# Patient Record
Sex: Female | Born: 1950 | Race: White | Hispanic: No | Marital: Single | Smoking: Never smoker
Health system: Southern US, Community
[De-identification: ages and names within clinical notes are randomized; demographics above are authoritative.]

## PROBLEM LIST (undated history)

## (undated) DIAGNOSIS — E119 Type 2 diabetes mellitus without complications: Secondary | ICD-10-CM

## (undated) DIAGNOSIS — S5292XA Unspecified fracture of left forearm, initial encounter for closed fracture: Secondary | ICD-10-CM

## (undated) DIAGNOSIS — I10 Essential (primary) hypertension: Secondary | ICD-10-CM

## (undated) DIAGNOSIS — E785 Hyperlipidemia, unspecified: Secondary | ICD-10-CM

## (undated) HISTORY — DX: Hyperlipidemia, unspecified: E78.5

## (undated) HISTORY — PX: TONSILLECTOMY: SUR1361

## (undated) HISTORY — DX: Type 2 diabetes mellitus without complications: E11.9

## (undated) HISTORY — PX: FRACTURE SURGERY: SHX138

## (undated) HISTORY — PX: FOOT SURGERY: SHX648

## (undated) HISTORY — DX: Essential (primary) hypertension: I10

---

## 2000-06-16 ENCOUNTER — Encounter: Admission: RE | Admit: 2000-06-16 | Discharge: 2000-06-16 | Payer: Self-pay | Admitting: Occupational Medicine

## 2000-06-16 ENCOUNTER — Encounter: Payer: Self-pay | Admitting: Occupational Medicine

## 2020-12-05 DIAGNOSIS — H547 Unspecified visual loss: Secondary | ICD-10-CM | POA: Insufficient documentation

## 2020-12-05 DIAGNOSIS — H9190 Unspecified hearing loss, unspecified ear: Secondary | ICD-10-CM | POA: Insufficient documentation

## 2021-05-23 ENCOUNTER — Inpatient Hospital Stay
Admission: EM | Admit: 2021-05-23 | Discharge: 2021-05-26 | DRG: 065 | Disposition: A | Discharge status: Rehabilitation Facility | Payer: Medicare Other | Attending: Internal Medicine | Admitting: Internal Medicine

## 2021-05-23 ENCOUNTER — Other Ambulatory Visit: Payer: Self-pay

## 2021-05-23 ENCOUNTER — Other Ambulatory Visit: Payer: Self-pay | Admitting: Neurology

## 2021-05-23 ENCOUNTER — Observation Stay: Payer: Medicare Other

## 2021-05-23 ENCOUNTER — Encounter: Payer: Self-pay | Admitting: Emergency Medicine

## 2021-05-23 ENCOUNTER — Emergency Department: Payer: Medicare Other

## 2021-05-23 DIAGNOSIS — E1165 Type 2 diabetes mellitus with hyperglycemia: Secondary | ICD-10-CM | POA: Diagnosis present

## 2021-05-23 DIAGNOSIS — I10 Essential (primary) hypertension: Secondary | ICD-10-CM | POA: Diagnosis present

## 2021-05-23 DIAGNOSIS — Z28310 Unvaccinated for covid-19: Secondary | ICD-10-CM

## 2021-05-23 DIAGNOSIS — I63522 Cerebral infarction due to unspecified occlusion or stenosis of left anterior cerebral artery: Secondary | ICD-10-CM | POA: Diagnosis not present

## 2021-05-23 DIAGNOSIS — R471 Dysarthria and anarthria: Secondary | ICD-10-CM | POA: Diagnosis present

## 2021-05-23 DIAGNOSIS — Z79899 Other long term (current) drug therapy: Secondary | ICD-10-CM

## 2021-05-23 DIAGNOSIS — I639 Cerebral infarction, unspecified: Secondary | ICD-10-CM | POA: Diagnosis present

## 2021-05-23 DIAGNOSIS — G8191 Hemiplegia, unspecified affecting right dominant side: Secondary | ICD-10-CM | POA: Diagnosis present

## 2021-05-23 DIAGNOSIS — Z20822 Contact with and (suspected) exposure to covid-19: Secondary | ICD-10-CM | POA: Diagnosis present

## 2021-05-23 DIAGNOSIS — Z833 Family history of diabetes mellitus: Secondary | ICD-10-CM

## 2021-05-23 DIAGNOSIS — E538 Deficiency of other specified B group vitamins: Secondary | ICD-10-CM

## 2021-05-23 DIAGNOSIS — E785 Hyperlipidemia, unspecified: Secondary | ICD-10-CM | POA: Diagnosis present

## 2021-05-23 DIAGNOSIS — I6389 Other cerebral infarction: Principal | ICD-10-CM | POA: Diagnosis present

## 2021-05-23 DIAGNOSIS — R29810 Facial weakness: Secondary | ICD-10-CM | POA: Diagnosis present

## 2021-05-23 DIAGNOSIS — R29702 NIHSS score 2: Secondary | ICD-10-CM | POA: Diagnosis present

## 2021-05-23 DIAGNOSIS — R4701 Aphasia: Secondary | ICD-10-CM | POA: Diagnosis present

## 2021-05-23 DIAGNOSIS — Z8249 Family history of ischemic heart disease and other diseases of the circulatory system: Secondary | ICD-10-CM

## 2021-05-23 DIAGNOSIS — E118 Type 2 diabetes mellitus with unspecified complications: Secondary | ICD-10-CM

## 2021-05-23 DIAGNOSIS — Z823 Family history of stroke: Secondary | ICD-10-CM

## 2021-05-23 DIAGNOSIS — E1169 Type 2 diabetes mellitus with other specified complication: Secondary | ICD-10-CM | POA: Diagnosis present

## 2021-05-23 HISTORY — DX: Cerebral infarction, unspecified: I63.9

## 2021-05-23 HISTORY — DX: Unspecified fracture of left forearm, initial encounter for closed fracture: S52.92XA

## 2021-05-23 LAB — DIFFERENTIAL
Abs Immature Granulocytes: 0.04 10*3/uL (ref 0.00–0.07)
Basophils Absolute: 0 10*3/uL (ref 0.0–0.1)
Basophils Relative: 1 %
Eosinophils Absolute: 0.1 10*3/uL (ref 0.0–0.5)
Eosinophils Relative: 1 %
Immature Granulocytes: 1 %
Lymphocytes Relative: 25 %
Lymphs Abs: 2.1 10*3/uL (ref 0.7–4.0)
Monocytes Absolute: 0.7 10*3/uL (ref 0.1–1.0)
Monocytes Relative: 8 %
Neutro Abs: 5.5 10*3/uL (ref 1.7–7.7)
Neutrophils Relative %: 64 %

## 2021-05-23 LAB — CBC
HCT: 41.6 % (ref 36.0–46.0)
Hemoglobin: 14.8 g/dL (ref 12.0–15.0)
MCH: 29.1 pg (ref 26.0–34.0)
MCHC: 35.6 g/dL (ref 30.0–36.0)
MCV: 81.9 fL (ref 80.0–100.0)
Platelets: 337 10*3/uL (ref 150–400)
RBC: 5.08 MIL/uL (ref 3.87–5.11)
RDW: 12.4 % (ref 11.5–15.5)
WBC: 8.4 10*3/uL (ref 4.0–10.5)
nRBC: 0 % (ref 0.0–0.2)

## 2021-05-23 LAB — URINALYSIS, COMPLETE (UACMP) WITH MICROSCOPIC
Bilirubin Urine: NEGATIVE
Glucose, UA: >=500 mg/dL — AB
Hgb urine dipstick: NEGATIVE
Ketones, ur: 5 mg/dL — AB
Nitrite: NEGATIVE
Protein, ur: NEGATIVE mg/dL
Specific Gravity, Urine: 1.03 (ref 1.005–1.030)
pH: 5 (ref 5.0–8.0)

## 2021-05-23 LAB — COMPREHENSIVE METABOLIC PANEL
ALT: 24 U/L (ref 0–44)
AST: 26 U/L (ref 15–41)
Albumin: 4.1 g/dL (ref 3.5–5.0)
Alkaline Phosphatase: 63 U/L (ref 38–126)
Anion gap: 9 (ref 5–15)
BUN: 16 mg/dL (ref 8–23)
CO2: 25 mmol/L (ref 22–32)
Calcium: 9.6 mg/dL (ref 8.9–10.3)
Chloride: 102 mmol/L (ref 98–111)
Creatinine, Ser: 1.01 mg/dL — ABNORMAL HIGH (ref 0.44–1.00)
GFR, Estimated: >60 mL/min (ref >60)
Glucose, Bld: 372 mg/dL — ABNORMAL HIGH (ref 70–99)
Potassium: 4.3 mmol/L (ref 3.5–5.1)
Sodium: 136 mmol/L (ref 135–145)
Total Bilirubin: 0.6 mg/dL (ref 0.3–1.2)
Total Protein: 7.7 g/dL (ref 6.5–8.1)

## 2021-05-23 LAB — APTT: aPTT: 26 seconds (ref 24–36)

## 2021-05-23 LAB — VITAMIN B12: Vitamin B-12: 97 pg/mL — ABNORMAL LOW (ref 180–914)

## 2021-05-23 LAB — PROTIME-INR
INR: 0.9 (ref 0.8–1.2)
Prothrombin Time: 12.6 seconds (ref 11.4–15.2)

## 2021-05-23 MED ORDER — ASPIRIN EC 81 MG PO TBEC
324.0000 mg | DELAYED_RELEASE_TABLET | Freq: Once | ORAL | Status: AC
  Administered 2021-05-23: 324 mg via ORAL
  Filled 2021-05-23: qty 4

## 2021-05-23 MED ORDER — ENOXAPARIN SODIUM 40 MG/0.4ML IJ SOSY
40.0000 mg | PREFILLED_SYRINGE | INTRAMUSCULAR | Status: DC
  Administered 2021-05-24 – 2021-05-25 (×3): 40 mg via SUBCUTANEOUS
  Filled 2021-05-23 (×3): qty 0.4

## 2021-05-23 MED ORDER — AMLODIPINE BESYLATE 5 MG PO TABS
5.0000 mg | ORAL_TABLET | Freq: Every day | ORAL | Status: DC
  Administered 2021-05-24 – 2021-05-26 (×3): 5 mg via ORAL
  Filled 2021-05-23 (×3): qty 1

## 2021-05-23 MED ORDER — GADOBUTROL 1 MMOL/ML IV SOLN
5.0000 mL | Freq: Once | INTRAVENOUS | Status: AC | PRN
  Administered 2021-05-23: 7.5 mL via INTRAVENOUS

## 2021-05-23 MED ORDER — STROKE: EARLY STAGES OF RECOVERY BOOK: Freq: Once | Status: DC

## 2021-05-23 MED ORDER — ACETAMINOPHEN 160 MG/5ML PO SOLN
650.0000 mg | ORAL | Status: DC | PRN
Start: 2021-05-23 — End: 2021-05-26
  Filled 2021-05-23: qty 20.3

## 2021-05-23 MED ORDER — LABETALOL HCL 5 MG/ML IV SOLN: 5.0000 mg | INTRAVENOUS | Status: AC | PRN

## 2021-05-23 MED ORDER — ASPIRIN EC 81 MG PO TBEC
81.0000 mg | DELAYED_RELEASE_TABLET | Freq: Every day | ORAL | Status: DC
  Administered 2021-05-24 – 2021-05-26 (×3): 81 mg via ORAL
  Filled 2021-05-23 (×3): qty 1

## 2021-05-23 MED ORDER — ACETAMINOPHEN 325 MG PO TABS: 650.0000 mg | ORAL_TABLET | ORAL | Status: DC | PRN

## 2021-05-23 MED ORDER — SENNOSIDES-DOCUSATE SODIUM 8.6-50 MG PO TABS: 1.0000 | ORAL_TABLET | Freq: Every evening | ORAL | Status: DC | PRN

## 2021-05-23 MED ORDER — ACETAMINOPHEN 650 MG RE SUPP: 650.0000 mg | RECTAL | Status: DC | PRN

## 2021-05-23 NOTE — ED Provider Notes (Signed)
[facility] Emergency Department Provider Note ____________________________________________   Event Date/Time   First MD Initiated Contact with Patient 05/23/21 1329     (approximate)  I have reviewed the triage vital signs and the nursing notes.  HISTORY  Chief Complaint No chief complaint on file.   HPI Chessie Neuharth is a 70 y.o. femalewho presents to the ED for evaluation of possible stroke.   Chart review indicates no relevant history.  Patient presents to the ED, accompanied by her sister, for evaluation of 1 week of neurologic changes.  Patient is initially hesitant to share much.  His sister tells me that she received a letter yesterday, that was dated on 6/16, that was a birthday card to sister's husband.  Sister reports that the handwriting was very abnormal for the patient and this cued her to call the patient and evaluate her in person yesterday.  Patient was initially resistant to come to the ED yesterday, but eventually relented and came today.  Patient reports about 1 week of voice changes, right grip strength weakness and feeling off.  She denies any syncopal episodes, falls or injuries.  Denies recent illnesses, fevers.  She takes no medications.  Non-smoker.   History reviewed. No pertinent past medical history.  Patient Active Problem List   Diagnosis Date Noted   Stroke (HCC) 05/23/2021     Prior to Admission medications   Not on File    Allergies Patient has no known allergies.  No family history on file.  Social History Social History   Tobacco Use   Passive exposure: Never  Substance Use Topics   Alcohol use: Never   Drug use: Never    Review of Systems  Constitutional: No fever/chills Eyes: No visual changes. ENT: No sore throat. Cardiovascular: Denies chest pain. Respiratory: Denies shortness of breath. Gastrointestinal: No abdominal pain.  No nausea, no vomiting.  No diarrhea.  No  constipation. Genitourinary: Negative for dysuria. Musculoskeletal: Negative for back pain. Skin: Negative for rash. Neurological: Negative for headaches.  Positive for dysarthria and right hand grip strength weakness.  ____________________________________________   PHYSICAL EXAM:  VITAL SIGNS: Vitals:   05/23/21 1207  BP: (!) 146/85  Pulse: (!) 105  Resp: 20  Temp: 98.6 F (37 C)  SpO2: 96%     Constitutional: Alert and oriented. Well appearing and in no acute distress. Eyes: Conjunctivae are normal. PERRL. EOMI. Head: Atraumatic.  Right-sided facial droop. Nose: No congestion/rhinnorhea. Mouth/Throat: Mucous membranes are moist.  Oropharynx non-erythematous. Neck: No stridor. No cervical spine tenderness to palpation. Cardiovascular: Normal rate, regular rhythm. Grossly normal heart sounds.  Good peripheral circulation. Respiratory: Normal respiratory effort.  No retractions. Lungs CTAB. Gastrointestinal: Soft , nondistended, nontender to palpation. No CVA tenderness. Musculoskeletal: No lower extremity tenderness nor edema.  No joint effusions. No signs of acute trauma. Neurologic: Dysarthria is present.  Right-sided facial droop is present.  Subtle pronator drift on the right side extremities. Skin:  Skin is warm, dry and intact. No rash noted. Psychiatric: Mood and affect are normal. Speech and behavior are normal.  ____________________________________________   LABS (all labs ordered are listed, but only abnormal results are displayed)  Labs Reviewed  COMPREHENSIVE METABOLIC PANEL - Abnormal; Notable for the following components:      Result Value   Glucose, Bld 372 (*)    Creatinine, Ser 1.01 (*)    All other components within normal limits  SARS CORONAVIRUS 2 (TAT 6-24 HRS)  PROTIME-INR  APTT  CBC  DIFFERENTIAL  URINALYSIS, COMPLETE (UACMP) WITH MICROSCOPIC  HIV ANTIBODY (ROUTINE TESTING W REFLEX)  HEMOGLOBIN A1C  VITAMIN B12  VITAMIN B1  THYROID PANEL  WITH TSH  I-STAT CREATININE, ED  CBG MONITORING, ED   ____________________________________________  12 Lead EKG  Sinus rhythm, rate of 86 bpm.  Normal axis and intervals.  No STEMI. ____________________________________________  RADIOLOGY  ED MD interpretation: 2 view CXR reviewed by me without evidence of acute cardiopulmonary pathology. CT head reviewed by me with evidence of acute infarct/hypodensity of left internal capsule.  Official radiology report(s): DG Chest 2 View  Result Date: 05/23/2021 CLINICAL DATA:  Possible stroke. Additional provided: Right-sided weakness for 1 month. EXAM: CHEST - 2 VIEW COMPARISON:  No pertinent prior exams available for comparison. FINDINGS: Heart size within normal limits. No appreciable airspace consolidation. No evidence of pleural effusion or pneumothorax. No acute bony abnormality identified. IMPRESSION: No evidence of active cardiopulmonary disease. Electronically Signed   By: Jackey Loge DO   On: 05/23/2021 13:01   CT HEAD WO CONTRAST  Result Date: 05/23/2021 CLINICAL DATA:  Stroke suspected. EXAM: CT HEAD WITHOUT CONTRAST TECHNIQUE: Contiguous axial images were obtained from the base of the skull through the vertex without intravenous contrast. COMPARISON:  No pertinent prior exams available for comparison. FINDINGS: Brain: Cerebral volume is normal for age. 3.3 x 1.6 x 2.4 cm acute or early subacute infarct within the left corona/internal capsule and ganglia. No evidence of hemorrhagic conversion. No significant mass effect at this time. No demarcated cortical infarct. No extra-axial fluid collection. No evidence of intracranial mass. No midline shift. Vascular: No hyperdense vessel.  Atherosclerotic calcifications. Skull: Normal. Negative for fracture or focal lesion. Sinuses/Orbits: Visualized orbits show no acute finding. Foci of polypoid mucosal thickening versus mucous retention cysts within the right ethmoid air cells. Other: Left mastoid  effusion. These results were called by telephone at the time of interpretation on 05/23/2021 at 1:06 pm to provider Dr. Larinda Buttery, who verbally acknowledged these results. IMPRESSION: 3.3 x 2.4 cm acute or early subacute infarct within the left corona/internal capsule and basal ganglia. No evidence of hemorrhagic conversion. No significant mass effect at this time. Mild right ethmoid sinus disease. Left mastoid effusion. Electronically Signed   By: Jackey Loge DO   On: 05/23/2021 13:08    ____________________________________________   PROCEDURES and INTERVENTIONS  Procedure(s) performed (including Critical Care):  Procedures  Medications   stroke: mapping our early stages of recovery book (has no administration in time range)  acetaminophen (TYLENOL) tablet 650 mg (has no administration in time range)    Or  acetaminophen (TYLENOL) 160 MG/5ML solution 650 mg (has no administration in time range)    Or  acetaminophen (TYLENOL) suppository 650 mg (has no administration in time range)  senna-docusate (Senokot-S) tablet 1 tablet (has no administration in time range)  enoxaparin (LOVENOX) injection 40 mg (has no administration in time range)  aspirin EC tablet 324 mg (has no administration in time range)    ____________________________________________   MDM / ED COURSE   70 year old woman with no medical history and has never seen a physician presents to the ED with a week of neurologic changes consistent with CVA requiring medical admission.  Dysarthria and right-sided facial droop are prominent, as well as subtle right-sided weakness.  No evidence of hemorrhagic conversion on CT, and does demonstrate evidence of hypodensities consistent with acute/subacute CVA.  No bleed.  No evidence of ACS.  We will admit to hospitalist for further work-up and  management.     ____________________________________________   FINAL CLINICAL IMPRESSION(S) / ED DIAGNOSES  Final diagnoses:  Cerebrovascular  accident (CVA) due to other mechanism [facility])     ED Discharge Orders     None        Derrel Moore Katrinka Blazing   Note:  This document was prepared using Dragon voice recognition software and may include unintentional dictation errors.    Delton Prairie, MD 05/23/21 [phone]

## 2021-05-23 NOTE — ED Notes (Signed)
Patient taken to MRI

## 2021-05-23 NOTE — ED Notes (Signed)
Patient ambulated to and from room commode with a steady gait. Patient denies dizziness.

## 2021-05-23 NOTE — ED Notes (Signed)
Report given to Dee RN.

## 2021-05-23 NOTE — ED Provider Notes (Signed)
Emergency Medicine Provider Triage Evaluation Note  Laura [name] , a 70 y.o. female  was evaluated in triage.  Pt complains of possible stroke. Sister is with the patient, reports she has been living alone for the past month since the death of their mother. She had not spoken to her recently, but received a birhday card in the mail for her husband yesterday and noted poor handwriting. When she called to talk to her, she noted slurred speech, and when visiting noted right sided weakness. No specific last known well.  Review of Systems  Positive: Right sided weakness, slurred speech Negative: Fever, dizziness  Physical Exam  BP (!) 146/85 (BP Location: Left Arm)   Pulse (!) 105   Temp 98.6 F (37 C)   Resp 20   Ht 5\' 8"  (1.727 m)   Wt 59 kg   SpO2 96%   BMI 19.77 kg/m  Gen:   Awake, no distress  Resp:  Normal effort  Neuro:  + right sided facial droop, most noticeable with smiling. Managing secretions on her own. +right pronator drift. No weakness noted of bilateral lower extremities Other:    Medical Decision Making  Medically screening exam initiated at 12:53 PM.  Appropriate orders placed.  Gurnoor Sloop was informed that the remainder of the evaluation will be completed by another provider, this initial triage assessment does not replace that evaluation, and the importance of remaining in the ED until their evaluation is complete.  Stroke orders placed, but with unknown last known well, no code stroke called at this time.    [name], PA 05/23/21 1257    05/25/21, MD 05/23/21 [phone]

## 2021-05-23 NOTE — ED Triage Notes (Signed)
Pts sister, brought pt in because they got a birthday card that hands witting was not hers they called the pt and her speech was not normal. They drove to her house yesterday and found that the right side of her face was drooping. They state that pt lives alone, she refused to come in yesterday. She has right drift on her arm and speech is garbled. Last known well time is not known.

## 2021-05-23 NOTE — Consult Note (Signed)
Neurology Consultation Reason for Consult:  Requesting Physician: Amy Cox  CC:   History is obtained from: Patient, Sister Anselmo Rod at bedside, chart review  HPI: Laura [name] is a 70 y.o. female with a past medical history significant for avoidance of healthcare, based on laboratory and clinical data thus far likely undiagnosed diabetes and hypertension, not on any medications  Family went to check on her after they noticed abnormal handwriting on card she sent (shaky but address was correct) and noted her to have a right facial droop on 6/18.  She initially refused transport to the ED but agreed to come in on 6/19.  Based on primary team's evaluation of postmarked etc. card was likely mailed on 6/15.  She has some mild confusion and memory impairment as well, for example she denies any vision issues to me and the family reports that she was having difficulty seeing a phone that they had handed to her when they visited the night before.  Family notes that the patient is not afraid of doctors or medical care and in fact takes her pets regularly for all of their vaccinations and routine checkups (she has cats and a dog), but she herself is declined checkups, COVID vaccination etc. as she has always felt healthy and fine and is "stubborn"  LKW: Unclear, prior to 6/15 tPA given?: No, out of the window  Premorbid modified rankin scale:      0 - No symptoms.  ROS: All other review of systems was negative except as noted in the HPI though somewhat unreliable given patient seems to minimize symptoms.   History reviewed. No pertinent past medical history.  Past Surgical History:  Procedure Laterality Date   FOOT SURGERY     TONSILLECTOMY     Family History  Problem Relation Age of Onset   Hypertension Mother    Diabetes Mother    Heart failure Father    Stroke Father    Hypertension Sister     Social History:  reports that she has never smoked. She has never been exposed to tobacco  smoke. She has never used smokeless tobacco. She reports that she does not drink alcohol and does not use drugs.  She is retired Psychologist, clinical and worked previously with the [employer]  Exam: Current vital signs: BP (!) 155/72   Pulse 82   Temp 98.6 F (37 C)   Resp 12   Ht 5' 8"  (1.727 m)   Wt 59 kg   SpO2 98%   BMI 19.77 kg/m  Vital signs in last 24 hours: Temp:  [98.6 F (37 C)] 98.6 F (37 C) (06/19 1207) Pulse Rate:  [82-105] 82 (06/19 1500) Resp:  [12-20] 12 (06/19 1500) BP: (146-155)/(72-85) 155/72 (06/19 1500) SpO2:  [96 %-99 %] 98 % (06/19 1500) Weight:  [59 kg] 59 kg (06/19 1208)   Physical Exam  Constitutional: Appears well-developed and well-nourished.  Psych: Affect fairly flat Eyes: No scleral injection HENT: No oropharyngeal obstruction.  MSK: no joint deformities.  Cardiovascular: Normal rate and regular rhythm.  Respiratory: Effort normal, non-labored breathing GI: Soft.  No distension. There is no tenderness.  Skin: Warm dry and intact visible skin  Neuro: Mental Status: Patient is awake, alert, oriented to person, place, month, year, and situation (which she describes as being told she had a brain injury due to a clot), and notably while she self corrects to the current month she initially replied November and initially started replying 19.  to the year and initially reportedly as the 6th Patient is able to give limited history Patient particularly struggles with finger naming and graphesthesia, mild difficulties with copying complex finger positions, able to name other objects and repeat simple phrases Cranial Nerves: II: Visual Fields are full, though she occasionally miss reports the number of fingers. Pupils are equal, round, and reactive to light.   III,IV, VI: EOMI without ptosis or diploplia.  V: Facial sensation is symmetric to light touch VII: Facial movement is notable for facial droop involving the upper face partially but the  lower face more notably VIII: hearing is intact to voice X: Uvula elevates symmetrically XI: Shoulder shrug is symmetric. XII: tongue is midline without atrophy or fasciculations.  Motor: Tone is normal. Bulk is normal. 5/5 strength was present in all four extremities.  There is mild pronation of the right upper extremity without downward drift Sensory: Sensation is symmetric to light touch in the arms and legs. Deep Tendon Reflexes: 2+ and symmetric in the biceps and patellae.  Cerebellar: FNF and HKS are intact bilaterally  NIHSS total 4 Score breakdown: 2 points for facial droop and one-point for incorrect answer to age and one-point for mild aphasia (finger anomia)  I have reviewed labs in epic and the results pertinent to this consultation are: Glucose 372 Creatinine 1.01 CBC within normal limits   I have personally reviewed the images obtained: Head CT with hypodensity in the left corona radiata/head of the caudate MRI brain demonstrating the same infarct, with additional chronic microvascular changes   Impression: Localization of stroke is suspicious for recurrent artery of [place]ub[place] involvement, given size embolic is a possibility though small vessel disease could also be playing a role.  Patient likely has multiple uncontrolled risk factors in the setting of limited prior medical care.  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 - Stroke labs TSH, ESR, RPR, HgbA1c, fasting lipid panel - MRI brain, personally reviewed as above - MRA of the brain without contrast and MRA neck w/wo  - Frequent neuro checks - Echocardiogram - Prophylactic therapy-Antiplatelet med: Aspirin - dose 338m PO or 3036mPR, followed by 81 mg daily - Consider Plavix 300 mg load with 75 mg daily for 21 - 90 day course pending vessel imaging and if echocardiogram/telemetry do not reveal indication for anticoagulation - Risk factor modification - Telemetry  monitoring; 30 day event monitor on discharge if no arrythmias captured  - Blood pressure goal   - Normotension, to be achieved gradually - PT consult, OT consult, Speech consult - Neurology to follow  SrLesleigh NoeD-PhD [facility] [phone] coverage for [facility] from 8 AM to 4 AM in-house and 4 PM to 8 PM by telephone/video. 8 PM to 8 AM emergent questions or overnight urgent questions should be addressed to Teleneurology On-call or [facility]; contact information can be found on AMION

## 2021-05-23 NOTE — ED Notes (Signed)
Family remains at bedside.

## 2021-05-23 NOTE — H&P (Signed)
History and Physical   Laura [name] MOQ:[id number] DOB: [birth year] DOA: 05/23/2021  PCP: Pcp, No  Patient coming from: Home via private vehicle  I have personally briefly reviewed patient's old medical records in [facility] EMR.  Chief Concern: Right sided facial droop and right upper extremity weakness  HPI: Laura [name] is a 70 y.o. right handed female who has not been to a healthcare provider for many years, no diagnosis, therefore not taking any prescribed medications presents to the emergency department for chief concerns of right-sided facial droop.  Per her sister, [name] at bedside: [name] and brother-in-law received a birthday card in which they noticed that patient's signature and writing of address on envelope was abnormal.  Comparison to address written by patient is in media.  At bedside, patient is able to garbled her name and her age.  Her speech exhibits aphasia and dysarthria.  She can hear and understand however does have difficulty responding.  She reports that she believes that she started feeling weak approximately on Tuesday, 05/18/2021.  She reports that she picked up the birthday card for her brother-in-law on Monday, 05/17/2021 where she felt like her normal self.  Her sister received the card and went to her home on evening of 05/22/2021. [name] reports that they noticed the facial drooping at that time and her difficulty speaking.  They advised patient to come to the hospital for further evaluation however patient refused.  Patient reports that if she has to go to the hospital, she will go in the morning.  Note: The birthday card showed that the mail post marked on 05/20/21 and it is likely that patient placed the card on 05/19/2021.   At bedside, patient has right facial drooping. Right arm weakness with flexion of the right elbow.  Right shoulder flexion also exhibits weakness with opposing force.  She exhibits right eyelid weakness   Social history: lives by  herself. She denies etoh, tobacco use, recreational drug use. She is retired since 2012. 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   Vaccination: not vaccinated for covid-19   ROS: Constitutional: no weight change, no fever ENT/Mouth: no sore throat, no rhinorrhea Eyes: no eye pain, + vision changes, + double vision evening of 05/22/21 and AM of 05/23/21 and resolved now Cardiovascular: no chest pain, no dyspnea,  no edema, no palpitations Respiratory: no cough, no sputum, no wheezing Gastrointestinal: no nausea, no vomiting, no diarrhea, no constipation Genitourinary: no urinary incontinence, no dysuria, no hematuria Musculoskeletal: no arthralgias, no myalgias Skin: no skin lesions, no pruritus, Neuro: + weakness, no loss of consciousness, no syncope Psych: no anxiety, no depression, no decrease appetite Heme/Lymph: no bruising, no bleeding  ED Course: Discussed with ED provider, patient requiring hospitalization for chief concerns of acute/early subacute stroke.  Vitals in the emergency department show temperature of 98.6, respiration rate of 20, heart rate 105, blood pressure 146/86 and increased to 155/75.  SPO2 of 96 percent on room air.  CT the head without contrast revealed 3.3 x 2.4 cm acute/early subacute infarct within the left corona/internal capsule.  Assessment/Plan  Active Problems:   Stroke [facility])   Right upper extremity and right facial drooping secondary to likely ischemic stroke in the left corona/internal capsule - CT head without contrast read as: Acute/early subacute infarct within the left corona/internal capsule - Neurologist, Dr. Iver Nestle, has been consulted and we appreciate further recommendations - Complete echo ordered - MRI of the brain ordered - Fasting  lipid and A1c ordered - We will check B12, B1, thyroid panel - Patient is outside the window for permissive hypertension - Frequent neuro vascular checks - Heart healthy  dysphagia 2 diet ordered - Aspirin 324 mg loading dose ordered - PT, OT ordered - Fall precaution   Hypertension on presentation-no previous diagnosis of hypertension - Patient is outside the window for permissive hypertension - Amlodipine 5 mg ordered for 05/24/2021 at 10 AM - Labetalol 5 mg IV every 2 hours as needed for SBP greater than 170, 1 day ordered  Chart reviewed.   DVT prophylaxis: Enoxaparin 40 mg subcutaneous every 24 hours Code Status: full code  Diet: heart healthy with thick liquids Family Communication: Updated sister, [name] at bedside Disposition Plan: Pending clinical course Consults called: Neurology Admission status: MedSurg, observation, 48 hours of telemetry ordered  History reviewed. No pertinent past medical history.  Past Surgical History:  Procedure Laterality Date   FOOT SURGERY     TONSILLECTOMY     Social History:  reports that she does not have a smoking history on file. She has never been exposed to tobacco smoke. She does not have any smokeless tobacco history on file. She reports that she does not drink alcohol and does not use drugs.  No Known Allergies Family History  Problem Relation Age of Onset   Hypertension Mother    Diabetes Mother    Heart failure Father    Stroke Father    Hypertension Sister    Family history: Family history reviewed and pertinent for father who suffered a stroke at age 94.   Physical Exam: Vitals:   05/23/21 1207 05/23/21 1208 05/23/21 1400 05/23/21 1500  BP: (!) 146/85  (!) 155/77 (!) 155/72  Pulse: (!) 105  84 82  Resp: 20   12  Temp: 98.6 F (37 C)     SpO2: 96%  99% 98%  Weight:  59 kg    Height:  5\' 8"  (1.727 m)     Constitutional: appears age-appropriate, NAD, calm, comfortable Eyes: PERRL, lids and conjunctivae normal ENMT: Mucous membranes are moist. Posterior pharynx clear of any exudate or lesions. Age-appropriate dentition. Hearing appropriate.  Right sided mouth drooping. Neck: normal,  supple, no masses, no thyromegaly Respiratory: clear to auscultation bilaterally, no wheezing, no crackles. Normal respiratory effort. No accessory muscle use.  Cardiovascular: Regular rate and rhythm, no murmurs / rubs / gallops. No extremity edema. 2+ pedal pulses. No carotid bruits.  Abdomen: no tenderness, no masses palpated, no hepatosplenomegaly. Bowel sounds positive.  Musculoskeletal: no clubbing / cyanosis. No joint deformity upper and lower extremities. Good ROM, no contractures, no atrophy. Normal muscle tone.  Skin: no rashes, lesions, ulcers. No induration Neurologic: Sensation intact. Strength 5/5 in all left upper extremity and bilateral lower extremity.  Strength is 4 out of 5 in the right upper extremity. Psychiatric: Normal judgment and insight. Alert and oriented x 3. Normal mood.   EKG: independently reviewed, showing sinus rhythm with rate of 86, QTc 491  Chest x-ray on Admission: I personally reviewed and I agree with radiologist reading as below.  DG Chest 2 View  Result Date: 05/23/2021 CLINICAL DATA:  Possible stroke. Additional provided: Right-sided weakness for 1 month. EXAM: CHEST - 2 VIEW COMPARISON:  No pertinent prior exams available for comparison. FINDINGS: Heart size within normal limits. No appreciable airspace consolidation. No evidence of pleural effusion or pneumothorax. No acute bony abnormality identified. IMPRESSION: No evidence of active cardiopulmonary disease. Electronically Signed  By: Jackey Loge DO   On: 05/23/2021 13:01   CT HEAD WO CONTRAST  Result Date: 05/23/2021 CLINICAL DATA:  Stroke suspected. EXAM: CT HEAD WITHOUT CONTRAST TECHNIQUE: Contiguous axial images were obtained from the base of the skull through the vertex without intravenous contrast. COMPARISON:  No pertinent prior exams available for comparison. FINDINGS: Brain: Cerebral volume is normal for age. 3.3 x 1.6 x 2.4 cm acute or early subacute infarct within the left corona/internal  capsule and ganglia. No evidence of hemorrhagic conversion. No significant mass effect at this time. No demarcated cortical infarct. No extra-axial fluid collection. No evidence of intracranial mass. No midline shift. Vascular: No hyperdense vessel.  Atherosclerotic calcifications. Skull: Normal. Negative for fracture or focal lesion. Sinuses/Orbits: Visualized orbits show no acute finding. Foci of polypoid mucosal thickening versus mucous retention cysts within the right ethmoid air cells. Other: Left mastoid effusion. These results were called by telephone at the time of interpretation on 05/23/2021 at 1:06 pm to provider Dr. Larinda Buttery, who verbally acknowledged these results. IMPRESSION: 3.3 x 2.4 cm acute or early subacute infarct within the left corona/internal capsule and basal ganglia. No evidence of hemorrhagic conversion. No significant mass effect at this time. Mild right ethmoid sinus disease. Left mastoid effusion. Electronically Signed   By: Jackey Loge DO   On: 05/23/2021 13:08    Labs on Admission: I have personally reviewed following labs  CBC: Recent Labs  Lab 05/23/21 1219  WBC 8.4  NEUTROABS 5.5  HGB 14.8  HCT 41.6  MCV 81.9  PLT 337   Basic Metabolic Panel: Recent Labs  Lab 05/23/21 1219  NA 136  K 4.3  CL 102  CO2 25  GLUCOSE 372*  BUN 16  CREATININE 1.01*  CALCIUM 9.6   GFR: Estimated Creatinine Clearance: 49 mL/min (A) (by C-G formula based on SCr of 1.01 mg/dL (H)).  Liver Function Tests: Recent Labs  Lab 05/23/21 1219  AST 26  ALT 24  ALKPHOS 63  BILITOT 0.6  PROT 7.7  ALBUMIN 4.1   Coagulation Profile: Recent Labs  Lab 05/23/21 1219  INR 0.9   CRITICAL CARE Performed by: Nadyne Coombes Chaitanya Amedee  Total critical care time: 35 minutes  Critical care time was exclusive of separately billable procedures and treating other patients.  Critical care was necessary to treat or prevent imminent or life-threatening deterioration. Acute stroke  Critical care was  time spent personally by me on the following activities: development of treatment plan with patient and/or surrogate as well as nursing, discussions with consultants, evaluation of patient's response to treatment, examination of patient, obtaining history from patient or surrogate, ordering and performing treatments and interventions, ordering and review of laboratory studies, ordering and review of radiographic studies, pulse oximetry and re-evaluation of patient's condition.  Jaiden Wahab N Tyesha Joffe D.O. [facility]  If 7PM-7AM, please contact overnight-coverage provider If 7AM-7PM, please contact day coverage provider [email or web address]  05/23/2021, 3:27 PM

## 2021-05-24 ENCOUNTER — Observation Stay (HOSPITAL_COMMUNITY)
Admit: 2021-05-24 | Discharge: 2021-05-24 | Disposition: A | Discharge status: Home / Self Care | Payer: Medicare Other | Attending: Internal Medicine | Admitting: Internal Medicine

## 2021-05-24 DIAGNOSIS — Z823 Family history of stroke: Secondary | ICD-10-CM | POA: Diagnosis not present

## 2021-05-24 DIAGNOSIS — D518 Other vitamin B12 deficiency anemias: Secondary | ICD-10-CM | POA: Diagnosis not present

## 2021-05-24 DIAGNOSIS — E785 Hyperlipidemia, unspecified: Secondary | ICD-10-CM | POA: Diagnosis present

## 2021-05-24 DIAGNOSIS — E538 Deficiency of other specified B group vitamins: Secondary | ICD-10-CM | POA: Diagnosis present

## 2021-05-24 DIAGNOSIS — Z8249 Family history of ischemic heart disease and other diseases of the circulatory system: Secondary | ICD-10-CM | POA: Diagnosis not present

## 2021-05-24 DIAGNOSIS — Z20822 Contact with and (suspected) exposure to covid-19: Secondary | ICD-10-CM | POA: Diagnosis present

## 2021-05-24 DIAGNOSIS — E1169 Type 2 diabetes mellitus with other specified complication: Secondary | ICD-10-CM | POA: Diagnosis present

## 2021-05-24 DIAGNOSIS — R471 Dysarthria and anarthria: Secondary | ICD-10-CM | POA: Diagnosis present

## 2021-05-24 DIAGNOSIS — I639 Cerebral infarction, unspecified: Secondary | ICD-10-CM

## 2021-05-24 DIAGNOSIS — R739 Hyperglycemia, unspecified: Secondary | ICD-10-CM | POA: Diagnosis not present

## 2021-05-24 DIAGNOSIS — I6389 Other cerebral infarction: Secondary | ICD-10-CM

## 2021-05-24 DIAGNOSIS — E1165 Type 2 diabetes mellitus with hyperglycemia: Secondary | ICD-10-CM | POA: Diagnosis present

## 2021-05-24 DIAGNOSIS — Z79899 Other long term (current) drug therapy: Secondary | ICD-10-CM | POA: Diagnosis not present

## 2021-05-24 DIAGNOSIS — R4701 Aphasia: Secondary | ICD-10-CM | POA: Diagnosis present

## 2021-05-24 DIAGNOSIS — Z28310 Unvaccinated for covid-19: Secondary | ICD-10-CM | POA: Diagnosis not present

## 2021-05-24 DIAGNOSIS — I10 Essential (primary) hypertension: Secondary | ICD-10-CM | POA: Diagnosis present

## 2021-05-24 DIAGNOSIS — R29810 Facial weakness: Secondary | ICD-10-CM | POA: Diagnosis present

## 2021-05-24 DIAGNOSIS — Z833 Family history of diabetes mellitus: Secondary | ICD-10-CM | POA: Diagnosis not present

## 2021-05-24 DIAGNOSIS — G8191 Hemiplegia, unspecified affecting right dominant side: Secondary | ICD-10-CM | POA: Diagnosis present

## 2021-05-24 DIAGNOSIS — R29702 NIHSS score 2: Secondary | ICD-10-CM | POA: Diagnosis present

## 2021-05-24 HISTORY — DX: Cerebral infarction, unspecified: I63.9

## 2021-05-24 LAB — HIV ANTIBODY (ROUTINE TESTING W REFLEX): HIV Screen 4th Generation wRfx: NONREACTIVE

## 2021-05-24 LAB — CBC
HCT: 38.5 % (ref 36.0–46.0)
Hemoglobin: 13.5 g/dL (ref 12.0–15.0)
MCH: 29.3 pg (ref 26.0–34.0)
MCHC: 35.1 g/dL (ref 30.0–36.0)
MCV: 83.5 fL (ref 80.0–100.0)
Platelets: 280 10*3/uL (ref 150–400)
RBC: 4.61 MIL/uL (ref 3.87–5.11)
RDW: 12.4 % (ref 11.5–15.5)
WBC: 6.8 10*3/uL (ref 4.0–10.5)
nRBC: 0 % (ref 0.0–0.2)

## 2021-05-24 LAB — ECHOCARDIOGRAM COMPLETE
AR max vel: 3.83 cm2
AV Area VTI: 4.13 cm2
AV Area mean vel: 3.76 cm2
AV Mean grad: 3 mmHg
AV Peak grad: 5.9 mmHg
Ao pk vel: 1.21 m/s
Area-P 1/2: 6.43 cm2
Calc EF: 34.5 %
Height: 68 in
MV VTI: 4.92 cm2
S' Lateral: 2.3 cm
Single Plane A2C EF: 33.7 %
Single Plane A4C EF: 32 %
Weight: 2080 oz

## 2021-05-24 LAB — BASIC METABOLIC PANEL
Anion gap: 8 (ref 5–15)
BUN: 13 mg/dL (ref 8–23)
CO2: 28 mmol/L (ref 22–32)
Calcium: 8.7 mg/dL — ABNORMAL LOW (ref 8.9–10.3)
Chloride: 100 mmol/L (ref 98–111)
Creatinine, Ser: 0.87 mg/dL (ref 0.44–1.00)
GFR, Estimated: >60 mL/min (ref >60)
Glucose, Bld: 233 mg/dL — ABNORMAL HIGH (ref 70–99)
Potassium: 3.9 mmol/L (ref 3.5–5.1)
Sodium: 136 mmol/L (ref 135–145)

## 2021-05-24 LAB — HEMOGLOBIN A1C
Hgb A1c MFr Bld: 12 % — ABNORMAL HIGH (ref 4.8–5.6)
Mean Plasma Glucose: 298 mg/dL

## 2021-05-24 LAB — SARS CORONAVIRUS 2 (TAT 6-24 HRS): SARS Coronavirus 2: NEGATIVE

## 2021-05-24 LAB — LIPID PANEL
Cholesterol: 192 mg/dL (ref 0–200)
HDL: 27 mg/dL — ABNORMAL LOW (ref >40)
LDL Cholesterol: 124 mg/dL — ABNORMAL HIGH (ref 0–99)
Total CHOL/HDL Ratio: 7.1 RATIO
Triglycerides: 203 mg/dL — ABNORMAL HIGH (ref <150)
VLDL: 41 mg/dL — ABNORMAL HIGH (ref 0–40)

## 2021-05-24 MED ORDER — VITAMIN B-12 1000 MCG PO TABS
1000.0000 ug | ORAL_TABLET | Freq: Every day | ORAL | Status: DC
  Administered 2021-05-25 – 2021-05-26 (×2): 1000 ug via ORAL
  Filled 2021-05-24 (×2): qty 1

## 2021-05-24 MED ORDER — ATORVASTATIN CALCIUM 20 MG PO TABS
80.0000 mg | ORAL_TABLET | Freq: Every day | ORAL | Status: DC
  Administered 2021-05-24 – 2021-05-25 (×2): 80 mg via ORAL
  Filled 2021-05-24 (×2): qty 4

## 2021-05-24 MED ORDER — CYANOCOBALAMIN 1000 MCG/ML IJ SOLN
1000.0000 ug | Freq: Once | INTRAMUSCULAR | Status: AC
  Administered 2021-05-24: 1000 ug via INTRAMUSCULAR
  Filled 2021-05-24: qty 1

## 2021-05-24 NOTE — Evaluation (Signed)
Physical Therapy Evaluation Patient Details Name: Laura [name] MRN: [id number] DOB: 21-May-1951 Today's Date: 05/24/2021   History of Present Illness  Pt is a 70 y.o. 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 PMH is not documented.  Clinical Impression  Pt awake AO x 4,oriented to name, DOB, date, and location. Unclear on situation. Both L and R UE and LE were 5/5 strength MMT. Sensation intact to light touch bilaterally. Decreased coordination, fine, and gross motor skills with R UE. Diminished cranial nerve 7 w/ R asymmetrical smile. Able to follow 1-step commands, but requires repetition when questioning intermittently, responds to visual cues for follow-up. Pt able to name 4-5 items on imaging sequence sheet.   Transfers and ambulation require min-guard for safety. Heart rate increases to mid 130s-low 140s with ambulation, took several rest breaks to lower heart rate. Pt denied symptoms of dizziness or overexertion. Pt demonstrates good sitting and standing balance with BLE supported. Decreased standing balance (< 2 seconds) on R LE. Pt able to recall set task sequence for common ADLs washing clothes with leading questions. Pt also asked about home safety such as contacting the fire department and was unable to recite appropriate steps for safety. Due to these deficits the patient would benefit from further therapy including balance, coordination, and functional mobility. Discharge recommendations include supervision due to safety concerns and home health PT/CIR after speaking with rehab team services.    Follow Up Recommendations   Supervision for mobility/OOB; Supervision - Intermittent; Home health PT; CIR   Equipment Recommendations  None recommended by PT    Recommendations for Other Services OT consult     Precautions / Restrictions Precautions Precautions: Fall      Mobility  Bed Mobility Overal bed mobility: Independent                   Transfers Overall transfer level: Needs assistance Equipment used: None Transfers: Sit to/from Stand Sit to Stand: Min guard         General transfer comment: safety  Ambulation/Gait Ambulation/Gait assistance: Min guard Gait Distance (Feet): 190 Feet Assistive device: None Gait Pattern/deviations: Decreased stance time - right     General Gait Details: Able to look around without LOB  Stairs            Wheelchair Mobility    Modified Rankin (Stroke Patients Only)       Balance Overall balance assessment: Needs assistance Sitting-balance support: No upper extremity supported;Feet supported Sitting balance-Leahy Scale: Good Sitting balance - Comments: Able to sit eyes closed without UE support 10 s   Standing balance support: No upper extremity supported;During functional activity Standing balance-Leahy Scale: Good   Single Leg Stance - Right Leg:  (Less than 2 sec w/ supervision) Single Leg Stance - Left Leg:  (10 seconds w/ supervision)                         Pertinent Vitals/Pain Pain Assessment: 0-10 Pain Score: 0-No pain    Home Living Family/patient expects to be discharged to:: Private residence Living Arrangements: Alone Available Help at Discharge: Family (2 sisters) Type of Home: House Home Access: Level entry     Home Layout: Two level;Able to live on main level with bedroom/bathroom;Other (Comment);Full bath on main level (Does not utilize 2nd floor)        Prior Function Level of Independence: Independent  Hand Dominance   Dominant Hand: Right    Extremity/Trunk Assessment   Upper Extremity Assessment Upper Extremity Assessment: RUE deficits/detail;LUE deficits/detail RUE Deficits / Details: MMT 5/5: Shoulder abd, elbow flex & tricep, grip RUE Sensation: WNL (SILT) RUE Coordination: decreased fine motor;decreased gross motor (Finger to Nose: delayed, accurate; finger opposition: delayed) LUE  Deficits / Details: MMT 5/5: Shoulder abd, elbow flex & tricep, grip LUE Sensation: WNL (SILT) LUE Coordination: WNL;decreased fine motor (Finger to Nose: delayed, inaccurate; finger opposition: delayed)    Lower Extremity Assessment Lower Extremity Assessment: RLE deficits/detail;LLE deficits/detail RLE Deficits / Details: MMT 5/5 hip flexion, knee extension, knee flexion, dorsiflexion, plantarflexion RLE Sensation: WNL (SILT) RLE Coordination: WNL (heel to shin: negative, alternating toe taps: negative) LLE Deficits / Details: MMT 5/5 hip flexion, knee extension, knee flexion, dorsiflexion, plantarflexion LLE Sensation: WNL (SILT) LLE Coordination: WNL (heel to shin: negative, alternating toe taps: negative)       Communication   Communication: Expressive difficulties  Cognition Arousal/Alertness: Awake/alert Behavior During Therapy: WFL for tasks assessed/performed Overall Cognitive Status: Impaired/Different from baseline Area of Impairment: Problem solving                             Problem Solving: Requires verbal cues General Comments: Able to respond appropriately to most questions, occasionally requires consistent prompts for correct response      General Comments      Exercises Other Exercises Other Exercises: Cranial nerves, hair brush, pronator drift, etcx   Assessment/Plan    PT Assessment Patient needs continued PT services  PT Problem List Decreased range of motion;Decreased activity tolerance;Decreased balance;Decreased mobility;Decreased coordination;Decreased safety awareness;Decreased strength;Decreased cognition       PT Treatment Interventions Gait training;Balance training;Neuromuscular re-education;Stair training;Functional mobility training;Therapeutic activities;Therapeutic exercise    PT Goals (Current goals can be found in the Care Plan section)  Acute Rehab PT Goals Patient Stated Goal: Improve mobility PT Goal Formulation: With  patient Time For Goal Achievement: 06/07/21 Potential to Achieve Goals: Good    Frequency 7X/week   Barriers to discharge        Co-evaluation               AM-PAC PT "6 Clicks" Mobility  Outcome Measure Help needed turning from your back to your side while in a flat bed without using bedrails?: None Help needed moving from lying on your back to sitting on the side of a flat bed without using bedrails?: None Help needed moving to and from a bed to a chair (including a wheelchair)?: A Little Help needed standing up from a chair using your arms (e.g., wheelchair or bedside chair)?: A Little Help needed to walk in hospital room?: A Little Help needed climbing 3-5 steps with a railing? : A Little 6 Click Score: 20    End of Session Equipment Utilized During Treatment: Gait belt Activity Tolerance: Patient tolerated treatment well Patient left: in bed;Other (comment) (Cardio in room for echo) Nurse Communication: Mobility status PT Visit Diagnosis: Other abnormalities of gait and mobility (R26.89);Muscle weakness (generalized) (M62.81);Other symptoms and signs involving the nervous system (R29.898);Unsteadiness on feet (R26.81);Hemiplegia and hemiparesis Hemiplegia - Right/Left: Right Hemiplegia - dominant/non-dominant: Dominant Hemiplegia - caused by: Cerebral infarction    Time: 5397-6734 PT Time Calculation (min) (ACUTE ONLY): 47 min   Charges:             [employer], SPT

## 2021-05-24 NOTE — Evaluation (Signed)
Speech Language Pathology Evaluation Patient Details Name: Halona Amstutz MRN: [id number] DOB: [birth year] Today's Date: 05/24/2021 Time: 4270-6237 SLP Time Calculation (min) (ACUTE ONLY): 37 min  Problem List:  Patient Active Problem List   Diagnosis Date Noted   Stroke [facility]) 05/23/2021   Essential hypertension 05/23/2021   Past Medical History: History reviewed. No pertinent past medical history. Past Surgical History:  Past Surgical History:  Procedure Laterality Date   FOOT SURGERY     TONSILLECTOMY     HPI:  Salvador Bigbee is a 70 y.o. female with a past medical history significant for avoidance of healthcare, based on laboratory and clinical data thus far likely undiagnosed diabetes and hypertension, not on any medications. LNW was mostly on 05/19/2021. Head CT with hypodensity in the left corona radiata/head of the caudate, MRI brain demonstrating the same infarct, with additional chronic microvascular change, location of stroke is suspicious for recurrent artery of [place] involvement.   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 Pt's speech is halting limited to single word to simple phrases utterances, contains phonemic paraphasias, and groping for words. Her ability to repeat words is less impaired. Her receptive language and reading appear grossly intact. As such, she can read word and phrase level information, answer yes/no questions and follow basic directions. 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 At this time, pt will require 24 hour supervision with follow up ST services at discharge d/t expressive deficits. Extensive education provided to pt and her sister on the above impairments. All questions answered to their satisfaction. Of note, pt passed [facility] Screen but was placed on nectar thick liquids. During this evaluation, pt consumed  thin liquids via straw at bedside with no overt s/s of aspiration. Diet order changed to reflect [facility] Swallow Screen.    SLP Assessment  SLP Recommendation/Assessment: Patient needs continued Speech Lanaguage Pathology Services SLP Visit Diagnosis: Apraxia (R48.2);Aphasia (R47.01)    Follow Up Recommendations  Inpatient Rehab;24 hour supervision/assistance    Frequency and Duration min 2x/week  2 weeks      SLP Evaluation Cognition  Overall Cognitive Status: Impaired/Different from baseline Arousal/Alertness: Awake/alert Orientation Level: Oriented X4 Attention: Selective Selective Attention: Impaired Selective Attention Impairment: Verbal basic;Functional basic Memory: Appears intact (when selecting appropriate orientation answers in field of 2 written choices) Awareness: Appears intact Safety/Judgment: Impaired       Comprehension  Auditory Comprehension Overall Auditory Comprehension: Appears within functional limits for tasks assessed Yes/No Questions: Within Functional Limits Commands: Within Functional Limits (basic directions provided to self) Conversation: Simple Interfering Components: Attention EffectiveTechniques: Repetition Visual Recognition/Discrimination Discrimination: Not tested Reading Comprehension Reading Status: Within funtional limits (basic words and phrases)    Expression Expression Primary Mode of Expression: Verbal Verbal Expression Overall Verbal Expression: Impaired Initiation: Impaired Automatic Speech: Name Level of Generative/Spontaneous Verbalization: Word Repetition: No impairment Naming: Impairment Responsive: 0-25% accurate Confrontation: Impaired Convergent: 0-24% accurate Divergent: 0-24% accurate Verbal Errors: Phonemic paraphasias;Semantic paraphasias;Aware of errors Pragmatics: No impairment Interfering Components: Attention Non-Verbal Means of Communication: Not applicable Written Expression Dominant Hand:  Right Written Expression: Not tested   Oral / Motor  Oral Motor/Sensory Function Overall Oral Motor/Sensory Function: Mild impairment Facial ROM: Reduced right Facial Symmetry: Abnormal symmetry right Facial Strength: Reduced right Lingual ROM: Within Functional Limits Lingual Symmetry: Within Functional Limits Lingual Strength: Within Functional Limits Lingual Sensation: Within Functional Limits Velum: Within Functional Limits Mandible: Within Functional Limits Motor  Speech Overall Motor Speech: Impaired Respiration: Within functional limits Phonation: Normal Resonance: Within functional limits Articulation: Impaired Level of Impairment: Word Intelligibility: Intelligibility reduced Word: 50-74% accurate Phrase: 50-74% accurate Sentence: Not tested Conversation: Not tested Motor Planning: Impaired Level of Impairment: Word Motor Speech Errors: Aware;Groping for words;Inconsistent   GO                   Gwen Sarvis B. Dreama Saa M.S., CCC-SLP, [facility] Speech-Language Pathologist Rehabilitation Services Office [phone]  Reuel Derby 05/24/2021, 9:43 AM

## 2021-05-24 NOTE — Progress Notes (Addendum)
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 MRI brain shows acute nonhemorrhagic infarct involving left caudate head & anterior lentiform nucleus. MRA head shows normal intracranial MRA and MRA neck shows 60% stenosis of proximal right internal carotid artery & no stenosis, dissection or occlusion of the left carotid system. Continue w/ neuro checks. Echo ordered. Continue on aspirin & plavix for 21 days then just aspirin as per neuro. Neuro following and recs apprec  HTN: continue on amlodipine. IV labetalol prn   Hyperglycemia: no hx of DM. HbA1c is pending   Vitamin B12 deficiency: will start B12 supplements   DVT prophylaxis: lovenox  Code Status: full  Family Communication:  Disposition Plan: depends on PT/OT recs   Level of care: Med-Surg  Status is: Observation  The patient remains OBS appropriate and will d/c before 2 midnights.  Dispo: The patient is from: Home              Anticipated d/c is to: SNF              Patient currently is not medically stable to d/c.   Difficult to place patient : unclear     Consultants:  Neuro   Procedures:   Antimicrobials:    Subjective: Pt c/o weakness  Objective: Vitals:   05/23/21 2330 05/24/21 0100 05/24/21 0200 05/24/21 0300  BP: 129/60 (!) 161/72 117/61 (!) 162/74  Pulse: 70 74 72 78  Resp: 16 19 17 16   Temp:    98.1 F (36.7 C)  SpO2: 96% 98% 96% 100%  Weight:      Height:       No intake or output data in the 24 hours ending 05/24/21 0728 Filed Weights   05/23/21 1208  Weight: 59 kg    Examination:  General exam: Appears calm and comfortable  Respiratory system: Clear to auscultation. Respiratory effort normal. Cardiovascular system: S1 & S2 +. No  rubs, gallops or clicks.  Gastrointestinal system: Abdomen is  nondistended, soft and nontender. Normal bowel sounds heard. Central nervous system: Alert and oriented. Right sided weakness & right facial droop Psychiatry: Judgement and insight appear normal. Flat mood and affect    Data Reviewed: I have personally reviewed following labs and imaging studies  CBC: Recent Labs  Lab 05/23/21 1219  WBC 8.4  NEUTROABS 5.5  HGB 14.8  HCT 41.6  MCV 81.9  PLT 337   Basic Metabolic Panel: Recent Labs  Lab 05/23/21 1219  NA 136  K 4.3  CL 102  CO2 25  GLUCOSE 372*  BUN 16  CREATININE 1.01*  CALCIUM 9.6   GFR: Estimated Creatinine Clearance: 49 mL/min (A) (by C-G formula based on SCr of 1.01 mg/dL (H)). Liver Function Tests: Recent Labs  Lab 05/23/21 1219  AST 26  ALT 24  ALKPHOS 63  BILITOT 0.6  PROT 7.7  ALBUMIN 4.1   No results for input(s): LIPASE, AMYLASE in the last 168 hours. No results for input(s): AMMONIA in the last 168 hours. Coagulation Profile: Recent Labs  Lab 05/23/21 1219  INR 0.9   Cardiac Enzymes: No results for input(s): CKTOTAL, CKMB, CKMBINDEX, TROPONINI in the last 168 hours. BNP (last 3 results) No results for input(s): PROBNP in the last 8760 hours. HbA1C: No results for input(s): HGBA1C in  the last 72 hours. CBG: No results for input(s): GLUCAP in the last 168 hours. Lipid Profile: No results for input(s): CHOL, HDL, LDLCALC, TRIG, CHOLHDL, LDLDIRECT in the last 72 hours. Thyroid Function Tests: No results for input(s): TSH, T4TOTAL, FREET4, T3FREE, THYROIDAB in the last 72 hours. Anemia Panel: Recent Labs    05/23/21 1420  VITAMINB12 97*   Sepsis Labs: No results for input(s): PROCALCITON, LATICACIDVEN in the last 168 hours.  Recent Results (from the past 240 hour(s))  SARS CORONAVIRUS 2 (TAT 6-24 HRS) Nasopharyngeal Nasopharyngeal Swab     Status: None   Collection Time: 05/23/21  2:20 PM   Specimen: Nasopharyngeal Swab  Result Value Ref Range Status   SARS Coronavirus 2 NEGATIVE  NEGATIVE Final    Comment: (NOTE) SARS-CoV-2 target nucleic acids are NOT DETECTED.  The SARS-CoV-2 RNA is generally detectable in upper and lower respiratory specimens during the acute phase of infection. Negative results do not preclude SARS-CoV-2 infection, do not rule out co-infections with other pathogens, and should not be used as the sole basis for treatment or other patient management decisions. Negative results must be combined with clinical observations, patient history, and epidemiological information. The expected result is Negative.  Fact Sheet for Patients: HairSlick.no  Fact Sheet for Healthcare Providers: quierodirigir.com  This test is not yet approved or cleared by the [place] FDA and  has been authorized for detection and/or diagnosis of SARS-CoV-2 by FDA under an Emergency Use Authorization (EUA). This EUA will remain  in effect (meaning this test can be used) for the duration of the COVID-19 declaration under Se ction 564(b)(1) of the Act, 21 U.S.C. section 360bbb-3(b)(1), unless the authorization is terminated or revoked sooner.  Performed at [facility], [address], [place], [place] [address]          Radiology Studies: DG Chest 2 View  Result Date: 05/23/2021 CLINICAL DATA:  Possible stroke. Additional provided: Right-sided weakness for 1 month. EXAM: CHEST - 2 VIEW COMPARISON:  No pertinent prior exams available for comparison. FINDINGS: Heart size within normal limits. No appreciable airspace consolidation. No evidence of pleural effusion or pneumothorax. No acute bony abnormality identified. IMPRESSION: No evidence of active cardiopulmonary disease. Electronically Signed   By: Jackey Loge DO   On: 05/23/2021 13:01   CT HEAD WO CONTRAST  Result Date: 05/23/2021 CLINICAL DATA:  Stroke suspected. EXAM: CT HEAD WITHOUT CONTRAST TECHNIQUE: Contiguous axial images were obtained from  the base of the skull through the vertex without intravenous contrast. COMPARISON:  No pertinent prior exams available for comparison. FINDINGS: Brain: Cerebral volume is normal for age. 3.3 x 1.6 x 2.4 cm acute or early subacute infarct within the left corona/internal capsule and ganglia. No evidence of hemorrhagic conversion. No significant mass effect at this time. No demarcated cortical infarct. No extra-axial fluid collection. No evidence of intracranial mass. No midline shift. Vascular: No hyperdense vessel.  Atherosclerotic calcifications. Skull: Normal. Negative for fracture or focal lesion. Sinuses/Orbits: Visualized orbits show no acute finding. Foci of polypoid mucosal thickening versus mucous retention cysts within the right ethmoid air cells. Other: Left mastoid effusion. These results were called by telephone at the time of interpretation on 05/23/2021 at 1:06 pm to provider Dr. Larinda Buttery, who verbally acknowledged these results. IMPRESSION: 3.3 x 2.4 cm acute or early subacute infarct within the left corona/internal capsule and basal ganglia. No evidence of hemorrhagic conversion. No significant mass effect at this time. Mild right ethmoid sinus disease. Left mastoid  effusion. Electronically Signed   By: Jackey Loge DO   On: 05/23/2021 13:08   MR ANGIO HEAD WO CONTRAST  Result Date: 05/23/2021 CLINICAL DATA:  Stroke follow-up EXAM: MRA HEAD WITHOUT CONTRAST TECHNIQUE: Angiographic images of the Circle of Willis were acquired using MRA technique without intravenous contrast. COMPARISON:  No pertinent prior exam. FINDINGS: POSTERIOR CIRCULATION: --Vertebral arteries: Normal --Inferior cerebellar arteries: Normal. --Basilar artery: Normal. --Superior cerebellar arteries: Normal. --Posterior cerebral arteries: Normal. ANTERIOR CIRCULATION: --Intracranial internal carotid arteries: Normal. --Anterior cerebral arteries (ACA): Normal. --Middle cerebral arteries (MCA): Normal. ANATOMIC VARIANTS: None Other:  None. IMPRESSION: Normal intracranial MRA. Electronically Signed   By: Deatra Robinson M.D.   On: 05/23/2021 21:46   MR ANGIO NECK W WO CONTRAST  Result Date: 05/23/2021 CLINICAL DATA:  Stroke follow-up EXAM: MRA NECK WITHOUT AND WITH CONTRAST TECHNIQUE: Multiplanar and multiecho pulse sequences of the neck were obtained without and with intravenous contrast. Angiographic images of the neck were obtained using MRA technique without and with intravenous contrast. CONTRAST:  7.73mL GADAVIST GADOBUTROL 1 MMOL/ML IV SOLN COMPARISON:  None. FINDINGS: There is 60% stenosis of the proximal right internal carotid artery. No stenosis, dissection or occlusion of the left carotid system or the vertebral arteries. IMPRESSION: 1. 60% stenosis of the proximal right internal carotid artery. 2. No stenosis, dissection or occlusion of the left carotid system or the vertebral arteries. Electronically Signed   By: Deatra Robinson M.D.   On: 05/23/2021 21:53   MR BRAIN WO CONTRAST  Result Date: 05/23/2021 CLINICAL DATA:  Week history of voice changes right upper extremity weakness. Abnormal CT of the head. EXAM: MRI HEAD WITHOUT CONTRAST TECHNIQUE: Multiplanar, multiecho pulse sequences of the brain and surrounding structures were obtained without intravenous contrast. COMPARISON:  CT head without contrast 12/23/2020 FINDINGS: Brain: Acute nonhemorrhagic infarct involving left caudate head and anterior lentiform nucleus is confirmed. Other acute infarct is present. Periventricular and subcortical T2 hyperintensities are otherwise mildly advanced for age. Mild white matter changes extend into the brainstem. The ventricles are of normal size. No significant extraaxial fluid collection is present. The internal auditory canals are within normal limits. Cerebellum is unremarkable. Vascular: Flow is present in the major intracranial arteries. Skull and upper cervical spine: The craniocervical junction is normal. Upper cervical spine is  within normal limits. Marrow signal is unremarkable. Sinuses/Orbits: Minimal fluid is present in the left mastoid air cells. The paranasal sinuses and mastoid air cells are otherwise clear. The globes and orbits are within normal limits. IMPRESSION: 1. Acute nonhemorrhagic infarct involving the left caudate head and anterior lentiform nucleus. 2. Periventricular and subcortical T2 hyperintensities bilaterally are mildly advanced for age. This likely reflects the sequela of chronic microvascular ischemia. Electronically Signed   By: Marin Roberts M.D.   On: 05/23/2021 18:48        Scheduled Meds:   stroke: mapping our early stages of recovery book   Does not apply Once   amLODipine  5 mg Oral Daily   aspirin EC  81 mg Oral Daily   enoxaparin (LOVENOX) injection  40 mg Subcutaneous Q24H   Continuous Infusions:   LOS: 0 days    Time spent: 33 mins    Charise Killian, MD [facility] Pager [phone]  If 7PM-7AM, please contact night-coverage 05/24/2021, 7:28 AM

## 2021-05-24 NOTE — TOC Progression Note (Signed)
Transition of Care [facility]) - Progression Note    Patient Details  Name: Laura [name] MRN: [id number] Date of Birth: September 04, 1951  Transition of Care [facility]) CM/SW Contact  Caryn Section, RN Phone Number: 05/24/2021, 3:11 PM  Clinical Narrative:   Patient lives at home alone.  She states that she can call her sister when needed, but her sister has a family and cannot always assist her.  Her sisters are at her bedside and they state that it was challenging to bring her to the hospital for symptoms of a stroke due to her concern for her dog and cats.  Her sisters are taking care of her animals at present, and state they can continue to care for them as long as she is hospitalized/in a SNF.  Sisters state she will not be able to get to appointments or get medication if she were to return home and patient admits she is unsteady on her feet at present.  She states she will try to learn how to become steady enough to take care of her home and pets.  RNCM and family spoke with patient about her disposition SNF/Rehab vs home.  Patient states her only concerns are for her animals and is fixated on that subject, despite continuous reassurance that someone is presently caring for them.  Patient is amenable to a bed search at this time, but would like to talk further about actually going to a facility.  RNCM and family stated we will take this process one step at a time.  OT and speech are recommending CIR at this time, awaiting PT note to gain consensus on recommendation.  TOC contact information given, TOC to follow to discharge.         Expected Discharge Plan and Services                                                 Social Determinants of Health (SDOH) Interventions    Readmission Risk Interventions No flowsheet data found.

## 2021-05-24 NOTE — ED Notes (Signed)
Pt denies any needs at this time, brought daughter recliner and updated poc

## 2021-05-24 NOTE — Evaluation (Addendum)
Occupational Therapy Evaluation Patient Details Name: Laura [name] MRN: [id number] DOB: November 05, 1951 Today's Date: 05/24/2021    History of Present Illness Pt is a 70 y.o. 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 PMH is not documented.   Clinical Impression   [name] was seen for OT evaluation this date. Prior to hospital admission, pt was Independent for I/ADLs and mobility. Pt lives alone in 2 level home, able to live on main with "many cats and dogs". Pt presents to acute OT demonstrating impaired ADL performance and functional mobility 2/2 decreased safety awareness, poor insight into deficits, decreased functional use of dominant RUE, and functional balance/endurance deficits. 2 supportive sisters at bedside t/o session. Pt completed trail making test requiring 1\' 18"  sec for part A and 3\' 59"  sec for part B with greater than 10 errors, pt unable to correct errors with MOD cues. The trail making test assesses visual scanning, executive function, and attention.   Pt currently requires SUPERVISION + MIN VCs don/doff B scoks seated EOB, cues for initation and termination. CGA + HHA toilet t/f and perihygiene - pt noted to furniture walk and bump into R wall without HHA. HR 130 during toilet t/f, 120s sitting EOB. MOD cues + CGA hand washing and tooth brushing standing sinkside - cues to incorporate R hand, min R neglect noted. Handwriting with gross grasp and ~25% legibility and proximal commpensation noted to write name.   Pt provided green theraputty and HEP, family in room receptive to education and agreeable to encourage compliance. Pt would benefit from skilled OT to address noted impairments and functional limitations (see below for any additional details) in order to maximize safety and independence while minimizing falls risk and caregiver burden. Upon hospital discharge, recommend CIR to maximize pt safety and return to functional independence during  meaningful occupations of daily life.     Follow Up Recommendations  CIR    Equipment Recommendations  3 in 1 bedside commode    Recommendations for Other Services       Precautions / Restrictions Precautions Precautions: Fall Restrictions Weight Bearing Restrictions: No      Mobility Bed Mobility Overal bed mobility: Needs Assistance Bed Mobility: Supine to Sit     Supine to sit: Supervision          Transfers Overall transfer level: Needs assistance Equipment used: 1 person hand held assist Transfers: Sit to/from Stand Sit to Stand: Min guard         General transfer comment: Pt noted to be reaching out for furniture during toilet t/f and bumping R side into wall, required HHA + CGA for safety    Balance Overall balance assessment: Needs assistance Sitting-balance support: No upper extremity supported;Feet supported Sitting balance-Leahy Scale: Good Sitting balance - Comments: slow movements, no LOB   Standing balance support: No upper extremity supported;During functional activity Standing balance-Leahy Scale: Fair Standing balance comment: pt reaching for UE support                       ADL either performed or assessed with clinical judgement   ADL Overall ADL's : Needs assistance/impaired                                       General ADL Comments: SUPERVISION + MIN VCs don/doff B scoks seated EOB, cues for initation and  termination. CGA + HHA toilet t/f and perihygiene - pt noted to furniture walk and bump into R wall without HHA. MOD cues + CGA hand washing and tooth brushing standing sinkside - cues to incorporate R hand, min R neglect noted. Handwriting with gross grasp and ~25% legibility and proximal commpensation noted to write name.      Pertinent Vitals/Pain Pain Assessment: No/denies pain Pain Score: 0-No pain     Hand Dominance Right   Extremity/Trunk Assessment Upper Extremity Assessment Upper Extremity  Assessment: RUE deficits/detail RUE Deficits / Details: Impaired palm to finger tip translatory patterns, decreased pinch strength, functional grip defciits - pt noted to use gross grasp pattern for comb/toothbrush/pen RUE Coordination: decreased fine motor;decreased gross motor   Lower Extremity Assessment Lower Extremity Assessment: Defer to PT evaluation       Communication Communication Communication: Expressive difficulties   Cognition Arousal/Alertness: Awake/alert Behavior During Therapy: WFL for tasks assessed/performed Overall Cognitive Status: Impaired/Different from baseline Area of Impairment: Following commands;Problem solving;Safety/judgement                       Following Commands: Follows one step commands with increased time;Follows one step commands inconsistently Safety/Judgement: Decreased awareness of safety;Decreased awareness of deficits   Problem Solving: Requires verbal cues;Requires tactile cues General Comments: word finding difficulty noted, requires visual/tactile cues for ADLs and safety   General Comments  HR 130 during toilet t/f, 120s sitting EOB.    Exercises Exercises: Other exercises Other Exercises Other Exercises: Pt and family educated re: OT role, DME recs, d/c recs, falls prevention, HEP (theraputty, activities), home/routines modifications, handwriting activities, visual scanning strategies Other Exercises: LBD, toileting, grooming, sup<>sit, sit<>stnad, sitting/standing balance/tolerance, theraputty exercises (grip, logroll, pinch, digit opposition) Other Exercises: Pt completed trail making test   Shoulder Instructions      Home Living Family/patient expects to be discharged to:: Private residence Living Arrangements: Alone Available Help at Discharge: Family (2 sisters) Type of Home: House Home Access: Level entry     Home Layout: Two level;Able to live on main level with bedroom/bathroom (does not utilize 2nd floor)                Home Equipment: None   Additional Comments: Per sisters in room, pt does not have working phone and no email address  Lives With: Alone    Prior Functioning/Environment Level of Independence: Independent        Comments: Previoulsy cared for her parents who have since passed away        OT Problem List: Decreased activity tolerance;Impaired balance (sitting and/or standing);Decreased coordination;Decreased cognition;Decreased safety awareness;Impaired UE functional use      OT Treatment/Interventions: Self-care/ADL training;Therapeutic exercise;Energy conservation;DME and/or AE instruction;Therapeutic activities;Patient/family education;Balance training;Cognitive remediation/compensation    OT Goals(Current goals can be found in the care plan section) Acute Rehab OT Goals Patient Stated Goal: Return to PLOF OT Goal Formulation: With patient/family Time For Goal Achievement: 06/07/21 Potential to Achieve Goals: Good ADL Goals Pt Will Perform Grooming: Independently;standing Pt Will Transfer to Toilet: Independently;ambulating;regular height toilet Additional ADL Goal #1: Pt will Independently verbalize plan to implement x3 falls prevention strategies  OT Frequency: Min 2X/week   Barriers to D/C: Decreased caregiver support             AM-PAC OT "6 Clicks" Daily Activity     Outcome Measure Help from another person eating meals?: A Little Help from another person taking care of personal grooming?: A Little Help from  another person toileting, which includes using toliet, bedpan, or urinal?: A Little Help from another person bathing (including washing, rinsing, drying)?: A Little Help from another person to put on and taking off regular upper body clothing?: A Little Help from another person to put on and taking off regular lower body clothing?: A Little 6 Click Score: 18   End of Session    Activity Tolerance: Patient tolerated treatment well Patient  left: in chair;with call bell/phone within reach;with chair alarm set;with family/visitor present;Other (comment) (CSW in room)  OT Visit Diagnosis: Other abnormalities of gait and mobility (R26.89)                Time: 6440-3474 OT Time Calculation (min): 51 min Charges:  OT General Charges $OT Visit: 1 Visit OT Evaluation $OT Eval Moderate Complexity: 1 Mod OT Treatments $Self Care/Home Management : 8-22 mins $Therapeutic Activity: 8-22 mins $Therapeutic Exercise: 8-22 mins  Kathie Dike, M.S. OTR/L  05/24/21, 3:01 PM  ascom [phone]

## 2021-05-24 NOTE — Progress Notes (Signed)
*  PRELIMINARY RESULTS* Echocardiogram 2D Echocardiogram has been performed.  Laura [name] 05/24/2021, 11:16 AM

## 2021-05-24 NOTE — Progress Notes (Signed)
Inpatient Diabetes Program Recommendations  AACE/ADA: New Consensus Statement on Inpatient Glycemic Control (2015)  Target Ranges:  Prepandial:   less than 140 mg/dL      Peak postprandial:   less than 180 mg/dL (1-2 hours)      Critically ill patients:  140 - 180 mg/dL    Review of Glycemic Control Results for CORNESHIA, HINES (MRN [id number]) as of 05/24/2021 10:39  Ref. Range 05/23/2021 12:19  Glucose Latest Ref Range: 70 - 99 mg/dL 448 (H)  Results for KIRSTON, LUTY (MRN [id number]) as of 05/24/2021 10:39  Ref. Range 05/24/2021 06:34  Glucose Latest Ref Range: 70 - 99 mg/dL 026 (H)   Diabetes history: None Outpatient Diabetes medications: None Current orders for Inpatient glycemic control: None  Inpatient Diabetes Program Recommendations:    Current A1C pending.    Please consider adding the Glycemic control order set using Novolog 0-9 units TID with CBG's ac/hs.    Will continue to follow while inpatient.  Thank you, Dulce Sellar, RN, BSN Diabetes Coordinator Inpatient Diabetes Program [phone] (team pager from 8a-5p)

## 2021-05-25 DIAGNOSIS — I10 Essential (primary) hypertension: Secondary | ICD-10-CM

## 2021-05-25 DIAGNOSIS — E1169 Type 2 diabetes mellitus with other specified complication: Secondary | ICD-10-CM

## 2021-05-25 LAB — BASIC METABOLIC PANEL
Anion gap: 7 (ref 5–15)
BUN: 15 mg/dL (ref 8–23)
CO2: 29 mmol/L (ref 22–32)
Calcium: 9 mg/dL (ref 8.9–10.3)
Chloride: 101 mmol/L (ref 98–111)
Creatinine, Ser: 0.98 mg/dL (ref 0.44–1.00)
GFR, Estimated: >60 mL/min (ref >60)
Glucose, Bld: 241 mg/dL — ABNORMAL HIGH (ref 70–99)
Potassium: 4.3 mmol/L (ref 3.5–5.1)
Sodium: 137 mmol/L (ref 135–145)

## 2021-05-25 LAB — THYROID PANEL WITH TSH
Free Thyroxine Index: 2.4 (ref 1.2–4.9)
T3 Uptake Ratio: 26 % (ref 24–39)
T4, Total: 9.2 ug/dL (ref 4.5–12.0)
TSH: 3.16 u[IU]/mL (ref 0.450–4.500)

## 2021-05-25 LAB — GLUCOSE, CAPILLARY
Glucose-Capillary: 226 mg/dL — ABNORMAL HIGH (ref 70–99)
Glucose-Capillary: 339 mg/dL — ABNORMAL HIGH (ref 70–99)
Glucose-Capillary: 298 mg/dL — ABNORMAL HIGH (ref 70–99)
Glucose-Capillary: 291 mg/dL — ABNORMAL HIGH (ref 70–99)

## 2021-05-25 LAB — CBC
HCT: 38.4 % (ref 36.0–46.0)
Hemoglobin: 13.2 g/dL (ref 12.0–15.0)
MCH: 29.1 pg (ref 26.0–34.0)
MCHC: 34.4 g/dL (ref 30.0–36.0)
MCV: 84.6 fL (ref 80.0–100.0)
Platelets: 258 10*3/uL (ref 150–400)
RBC: 4.54 MIL/uL (ref 3.87–5.11)
RDW: 12.4 % (ref 11.5–15.5)
WBC: 6.8 10*3/uL (ref 4.0–10.5)
nRBC: 0 % (ref 0.0–0.2)

## 2021-05-25 MED ORDER — METOPROLOL TARTRATE 5 MG/5ML IV SOLN: 5.0000 mg | Freq: Three times a day (TID) | INTRAVENOUS | Status: DC | PRN

## 2021-05-25 MED ORDER — INSULIN ASPART 100 UNIT/ML IJ SOLN
0.0000 [IU] | Freq: Three times a day (TID) | INTRAMUSCULAR | Status: DC
  Administered 2021-05-25: 12:00:00 7 [IU] via SUBCUTANEOUS
  Filled 2021-05-25: qty 1

## 2021-05-25 MED ORDER — INSULIN ASPART 100 UNIT/ML IJ SOLN
0.0000 [IU] | Freq: Three times a day (TID) | INTRAMUSCULAR | Status: DC
  Administered 2021-05-25: 17:00:00 5 [IU] via SUBCUTANEOUS
  Administered 2021-05-26: 08:00:00 3 [IU] via SUBCUTANEOUS
  Administered 2021-05-26: 5 [IU] via SUBCUTANEOUS
  Filled 2021-05-25 (×3): qty 1

## 2021-05-25 MED ORDER — INSULIN GLARGINE 100 UNIT/ML ~~LOC~~ SOLN
10.0000 [IU] | Freq: Every day | SUBCUTANEOUS | Status: DC
  Administered 2021-05-25 – 2021-05-26 (×2): 10 [IU] via SUBCUTANEOUS
  Filled 2021-05-25 (×2): qty 0.1

## 2021-05-25 MED ORDER — INSULIN ASPART 100 UNIT/ML IJ SOLN
3.0000 [IU] | Freq: Three times a day (TID) | INTRAMUSCULAR | Status: DC
  Administered 2021-05-25 – 2021-05-26 (×2): 3 [IU] via SUBCUTANEOUS
  Filled 2021-05-25 (×2): qty 1

## 2021-05-25 MED ORDER — INSULIN ASPART 100 UNIT/ML IJ SOLN
0.0000 [IU] | Freq: Every day | INTRAMUSCULAR | Status: DC
  Administered 2021-05-25: 23:00:00 3 [IU] via SUBCUTANEOUS
  Filled 2021-05-25: qty 1

## 2021-05-25 MED ORDER — LIVING WELL WITH DIABETES BOOK
Freq: Once | Status: AC
  Filled 2021-05-25: qty 1

## 2021-05-25 MED ORDER — INSULIN STARTER KIT- PEN NEEDLES (ENGLISH)
1.0000 | Freq: Once | Status: AC
  Administered 2021-05-25: 12:00:00 1
  Filled 2021-05-25: qty 1

## 2021-05-25 MED ORDER — INSULIN STARTER KIT- SYRINGES (ENGLISH)
1.0000 | Freq: Once | Status: AC
  Administered 2021-05-25: 1
  Filled 2021-05-25: qty 1

## 2021-05-25 NOTE — Progress Notes (Deleted)
Inpatient Rehab Admissions Coordinator Note:   Per OT/SLP recommendations, pt was screened for CIR candidacy by Estill Dooms, PT, DPT.  At this time pt appears to be mobilizing at too high a level for CIR (supervision to min guard for ADLs and gait up to 190' with no device), with deficits primarily in cognition and speech.  Given her cognitive deficits and high level of function she would most likely have supervision level goals on CIR, and she is already performing at or near that level.  I agree with PT recommendations for home health, or SNF if family cannot provide 24/7 supervision.  Please contact me with questions.   Estill Dooms, PT, DPT [phone] 05/25/21 9:44 AM

## 2021-05-25 NOTE — Progress Notes (Signed)
Inpatient Rehabilitation Admissions Coordinator   Inpatient rehab consul received. I contacted patient's sister, [name], at bedside by phone. We discussed goals and expectations of a possible Cir admit pending Rehab MD approval of her case. Sister and family can assist with providing 24/7 supervision at discharge after a short rehab stay and they prefer Cir rather than SNF. I contacted Michelle Nasuti, with TOC to discuss possible Cir admit on Wednesday. I will follow up in the am with final arrangements.  Ottie Glazier, RN, MSN Rehab Admissions Coordinator [phone] 05/25/2021 2:37 PM

## 2021-05-25 NOTE — Progress Notes (Signed)
Physical Therapy Treatment Patient Details Name: Laura [name] MRN: [id number] DOB: 06-Jul-1951 Today's Date: 05/25/2021    History of Present Illness Pt is a 70 y.o. 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 PMH is not documented.    PT Comments    Progressed to adding cognitive components with mobility during today's treatment session. Redirection is necessary for task specificity with decreased demonstration of prolonged dual tasking ability. Min-guard for standing balance without an assistive device for dynamic movements. Heart rate elevated to high 130s, low 140s with both standing marching and ambulation. Rest breaks incorporated into treatment, HR decreased to 110s within 60 seconds. Heart rate elevation with exertion, decreased balance, along with safety concerns remain the primary limiting factors. PT is necessary for continued return to PLOF to improve safety, balance, and motor tasks necessary for ADLs.   Follow Up Recommendations  Supervision for mobility/OOB;Supervision - Intermittent;Home health PT;CIR     Equipment Recommendations  None recommended by PT    Recommendations for Other Services       Precautions / Restrictions Precautions Precautions: Fall    Mobility  Bed Mobility Overal bed mobility: Independent Bed Mobility: Supine to Sit     Supine to sit: Independent          Transfers Overall transfer level: Needs assistance Equipment used: None Transfers: Sit to/from Stand Sit to Stand: Supervision   General transfer comment: Utilizes 1 UE  Ambulation/Gait Ambulation/Gait assistance: Min guard Gait Distance (Feet): 30 Feet Assistive device: None     General Gait Details: Decreased step length, cadence; takes wider turns   Social research officer, government Rankin (Stroke Patients Only)       Balance Overall balance assessment: Needs assistance Sitting-balance support: No upper  extremity supported;Feet supported Sitting balance-Leahy Scale: Good Sitting balance - Comments: Good control with dynamic movements   Standing balance support: No upper extremity supported;During functional activity Standing balance-Leahy Scale: Good Standing balance comment: Able to catch, toss without LOB                            Cognition                                              Exercises Other Exercises Other Exercises: Seated alternating reachs HR 120s; Standing reciprocal fwd steps HR high 130s, requires rest breaks; Other Exercises: Baldo Daub with cognitive components; standing marching w/ cognitive components; HR high 130s    General Comments        Pertinent Vitals/Pain Pain Assessment: No/denies pain    Home Living                      Prior Function            PT Goals (current goals can now be found in the care plan section) Acute Rehab PT Goals Patient Stated Goal: Improve mobility to return home with her animals PT Goal Formulation: With patient Time For Goal Achievement: 06/07/21 Potential to Achieve Goals: Good Progress towards PT goals: Progressing toward goals    Frequency    7X/week      PT Plan Current plan remains appropriate    Co-evaluation  AM-PAC PT "6 Clicks" Mobility   Outcome Measure  Help needed turning from your back to your side while in a flat bed without using bedrails?: None Help needed moving from lying on your back to sitting on the side of a flat bed without using bedrails?: None Help needed moving to and from a bed to a chair (including a wheelchair)?: A Little Help needed standing up from a chair using your arms (e.g., wheelchair or bedside chair)?: A Little Help needed to walk in hospital room?: A Little Help needed climbing 3-5 steps with a railing? : A Little 6 Click Score: 20    End of Session Equipment Utilized During Treatment: Gait  belt Activity Tolerance: Patient tolerated treatment well Patient left: in chair;with chair alarm set;with family/visitor present;with call bell/phone within reach   PT Visit Diagnosis: Other abnormalities of gait and mobility (R26.89);Muscle weakness (generalized) (M62.81);Other symptoms and signs involving the nervous system (R29.898);Unsteadiness on feet (R26.81);Hemiplegia and hemiparesis     Time: 1002-1035 PT Time Calculation (min) (ACUTE ONLY): 33 min  Charges:                        [employer], SPT

## 2021-05-25 NOTE — TOC Progression Note (Signed)
Transition of Care [facility]) - Progression Note    Patient Details  Name: Laura [name] MRN: [id number] Date of Birth: Jun 12, 1951  Transition of Care [facility] CM/SW Contact  Caryn Section, RN Phone Number: 05/25/2021, 3:28 PM  Clinical Narrative:   Per Britta Mccreedy at [facility], patient can go to the facility tomorrow for one week of therapy.  Will communicate with [facility] tomorrow regarding transfer.  TOC will follow through discharge.         Expected Discharge Plan and Services                                                 Social Determinants of Health (SDOH) Interventions    Readmission Risk Interventions No flowsheet data found.

## 2021-05-25 NOTE — Progress Notes (Signed)
PROGRESS NOTE   HPI was taken from Dr. Sedalia Mutaox: Laura [name] is a 70 y.o. right handed female who has not been to a healthcare provider for many years, no diagnosis, therefore not taking any prescribed medications presents to the emergency department for chief concerns of right-sided facial droop.  Per her sister, Laura Hashimotoatricia at bedside: Laura Hashimotoatricia and brother-in-law received a birthday card in which they noticed that patient's signature and writing of address on envelope was abnormal.  Comparison to address written by patient is in media.   At bedside, patient is able to garbled her name and her age.  Her speech exhibits aphasia and dysarthria.  She can hear and understand however does have difficulty responding.  She reports that she believes that she started feeling weak approximately on Tuesday, 05/18/2021.  She reports that she picked up the birthday card for her brother-in-law on Monday, 05/17/2021 where she felt like her normal self.   Her sister received the card and went to her home on evening of 05/22/2021. Laura Hashimotoatricia reports that they noticed the facial drooping at that time and her difficulty speaking.  They advised patient to come to the hospital for further evaluation however patient refused.  Patient reports that if she has to go to the hospital, she will go in the morning.   Note: The birthday card showed that the mail post marked on 05/20/21 and it is likely that patient placed the card on 05/19/2021.    At bedside, patient has right facial drooping. Right arm weakness with flexion of the right elbow.  Right shoulder flexion also exhibits weakness with opposing force.  She exhibits right eyelid weakness   Social history: lives by herself. She denies etoh, tobacco use, recreational drug use. She is retired since 2012. 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    [facility]accination: [facility]   Hospital course from Dr. Mayford KnifeWilliams 6/20-6/21/22: Pt was  found to have CVA. Pt was started on aspirin, plavix x 21 days and then just aspirin after as per neuro. Pt is also on a statin. PT/OT are recommending CIR. CIR consult has been placed. Of note, pt was found to have new onset DM2 w/ HbA1c 12.0. Pt was educated on DM and DM coordinator was consulted    Laura GurneyWanda Yohannes  Laura [name] DOA: 05/23/2021 PCP: Pcp, No   Assessment & Plan:   Active Problems:   Stroke [facility](HCC)   Essential hypertension   CVA (cerebral vascular accident) (HCC)   CVA: infarct w/in left corona/internal capsule as per CT. MRI brain shows acute nonhemorrhagic infarct involving left caudate head & anterior lentiform nucleus. MRA head shows normal intracranial MRA and MRA neck shows 60% stenosis of proximal right internal carotid artery & no stenosis, dissection or occlusion of the left carotid system. Continue w/ neuro checks. Echo shows EF 65-70%, grade I diastolic dysfunction. Continue on aspirin & plavix for 21 days then just aspirin as per neuro. Neuro following and recs apprec. PT/OT recs CIR  DM2: new onset, poorly controlled. HbA1c 12.0. Started on SSI w/ accuchecks. DM coordinator consulted   HTN: continue on CCB. IV labetalol prn   Vitamin B12 deficiency: continue on B12 supplements    DVT prophylaxis: lovenox  Code Status: full  Family Communication: talked w/ pt's family at bedside and answered their questions Disposition Plan: depends on PT/OT recs   Level of care: Med-Surg  Status is: Inpatient  Remains inpatient appropriate because:Unsafe d/c plan  and Inpatient level of care appropriate due to severity of illness  Dispo: The patient is from: Home              Anticipated d/c is to: CIR              Patient currently is medically stable to d/c.   Difficult to place patient: unclear           Consultants:  Neuro   Procedures:   Antimicrobials:    Subjective: Pt c/o fatigue   Objective: Vitals:   05/24/21 1609 05/24/21  2028 05/25/21 0050 05/25/21 0327  BP: 122/70 127/73 137/70 133/66  Pulse: 81 81 80 76  Resp: Temp: 98.3 F (36.8 C) 97.7 F (36.5 C) 98.4 F (36.9 C) (!) 97.4 F (36.3 C)  TempSrc:  Oral Oral Oral  SpO2: 97% 97% 97% 98%  Weight:      Height:        Intake/Output Summary (Last 24 hours) at 05/25/2021 0723 Last data filed at 05/24/2021 1855 Gross per 24 hour  Intake 240 ml  Output --  Net 240 ml   Filed Weights   05/23/21 1208  Weight: 59 kg    Examination:  General exam: Appears comfortable  Respiratory system: clear breath sounds b/l  Cardiovascular system: S1/S2+. No rubs or gallops Gastrointestinal system: Abd is soft, NT, ND & hypoactive bowel sounds  Central nervous system: Alert and oriented. Right sided weakness & right facial droop Psychiatry: judgement and insight appear normal. Flat mood and affect    Data Reviewed: I have personally reviewed following labs and imaging studies  CBC: Recent Labs  Lab 05/23/21 1219 05/24/21 0634 05/25/21 0526  WBC 8.4 6.8 6.8  NEUTROABS 5.5  --   --   HGB 14.8 13.5 13.2  HCT 41.6 38.5 38.4  MCV 81.9 83.5 84.6  PLT 337 280 258   Basic Metabolic Panel: Recent Labs  Lab 05/23/21 1219 05/24/21 0634 05/25/21 0526  NA 136 136 137  K 4.3 3.9 4.3  CL 102 100 101  CO2 GLUCOSE 372* 233* 241*  BUN CREATININE 1.01* 0.87 0.98  CALCIUM 9.6 8.7* 9.0   GFR: Estimated Creatinine Clearance: 50.5 mL/min (by C-G formula based on SCr of 0.98 mg/dL). Liver Function Tests: Recent Labs  Lab 05/23/21 1219  AST 26  ALT 24  ALKPHOS 63  BILITOT 0.6  PROT 7.7  ALBUMIN 4.1   No results for input(s): LIPASE, AMYLASE in the last 168 hours. No results for input(s): AMMONIA in the last 168 hours. Coagulation Profile: Recent Labs  Lab 05/23/21 1219  INR 0.9   Cardiac Enzymes: No results for input(s): CKTOTAL, CKMB, CKMBINDEX, TROPONINI in the last 168 hours. BNP (last 3 results) No results  for input(s): PROBNP in the last 8760 hours. HbA1C: Recent Labs    05/23/21 1420  HGBA1C 12.0*   CBG: No results for input(s): GLUCAP in the last 168 hours. Lipid Profile: Recent Labs    05/24/21 0631  CHOL 192  HDL 27*  LDLCALC 124*  TRIG 203*  CHOLHDL 7.1   Thyroid Function Tests: No results for input(s): TSH, T4TOTAL, FREET4, T3FREE, THYROIDAB in the last 72 hours. Anemia Panel: Recent Labs    05/23/21 1420  VITAMINB12 97*   Sepsis Labs: No results for input(s): PROCALCITON, LATICACIDVEN in the last 168 hours.  Recent Results (from the past 240 hour(s))  SARS CORONAVIRUS 2 (TAT  6-24 HRS) Nasopharyngeal Nasopharyngeal Swab     Status: None   Collection Time: 05/23/21  2:20 PM   Specimen: Nasopharyngeal Swab  Result Value Ref Range Status   SARS Coronavirus 2 NEGATIVE NEGATIVE Final    Comment: (NOTE) SARS-CoV-2 target nucleic acids are NOT DETECTED.  The SARS-CoV-2 RNA is generally detectable in upper and lower respiratory specimens during the acute phase of infection. Negative results do not preclude SARS-CoV-2 infection, do not rule out co-infections with other pathogens, and should not be used as the sole basis for treatment or other patient management decisions. Negative results must be combined with clinical observations, patient history, and epidemiological information. The expected result is Negative.  Fact Sheet for Patients: HairSlick.no  Fact Sheet for Healthcare Providers: quierodirigir.com  This test is not yet approved or cleared by the [place] FDA and  has been authorized for detection and/or diagnosis of SARS-CoV-2 by FDA under an Emergency Use Authorization (EUA). This EUA will remain  in effect (meaning this test can be used) for the duration of the COVID-19 declaration under Se ction 564(b)(1) of the Act, 21 U.S.C. section 360bbb-3(b)(1), unless the authorization is terminated  or revoked sooner.  Performed at [facility], [address] [place], [place] [address]          Radiology Studies: DG Chest 2 View  Result Date: 05/23/2021 CLINICAL DATA:  Possible stroke. Additional provided: Right-sided weakness for 1 month. EXAM: CHEST - 2 VIEW COMPARISON:  No pertinent prior exams available for comparison. FINDINGS: Heart size within normal limits. No appreciable airspace consolidation. No evidence of pleural effusion or pneumothorax. No acute bony abnormality identified. IMPRESSION: No evidence of active cardiopulmonary disease. Electronically Signed   By: Jackey Loge DO   On: 05/23/2021 13:01   CT HEAD WO CONTRAST  Result Date: 05/23/2021 CLINICAL DATA:  Stroke suspected. EXAM: CT HEAD WITHOUT CONTRAST TECHNIQUE: Contiguous axial images were obtained from the base of the skull through the vertex without intravenous contrast. COMPARISON:  No pertinent prior exams available for comparison. FINDINGS: Brain: Cerebral volume is normal for age. 3.3 x 1.6 x 2.4 cm acute or early subacute infarct within the left corona/internal capsule and ganglia. No evidence of hemorrhagic conversion. No significant mass effect at this time. No demarcated cortical infarct. No extra-axial fluid collection. No evidence of intracranial mass. No midline shift. Vascular: No hyperdense vessel.  Atherosclerotic calcifications. Skull: Normal. Negative for fracture or focal lesion. Sinuses/Orbits: Visualized orbits show no acute finding. Foci of polypoid mucosal thickening versus mucous retention cysts within the right ethmoid air cells. Other: Left mastoid effusion. These results were called by telephone at the time of interpretation on 05/23/2021 at 1:06 pm to provider Dr. Larinda Buttery, who verbally acknowledged these results. IMPRESSION: 3.3 x 2.4 cm acute or early subacute infarct within the left corona/internal capsule and basal ganglia. No evidence of hemorrhagic conversion. No significant mass  effect at this time. Mild right ethmoid sinus disease. Left mastoid effusion. Electronically Signed   By: Jackey Loge DO   On: 05/23/2021 13:08   MR ANGIO HEAD WO CONTRAST  Result Date: 05/23/2021 CLINICAL DATA:  Stroke follow-up EXAM: MRA HEAD WITHOUT CONTRAST TECHNIQUE: Angiographic images of the Circle of Willis were acquired using MRA technique without intravenous contrast. COMPARISON:  No pertinent prior exam. FINDINGS: POSTERIOR CIRCULATION: --Vertebral arteries: Normal --Inferior cerebellar arteries: Normal. --Basilar artery: Normal. --Superior cerebellar arteries: Normal. --Posterior cerebral arteries: Normal. ANTERIOR CIRCULATION: --Intracranial internal carotid arteries: Normal. --Anterior cerebral arteries (  ACA): Normal. --Middle cerebral arteries (MCA): Normal. ANATOMIC VARIANTS: None Other: None. IMPRESSION: Normal intracranial MRA. Electronically Signed   By: Deatra Robinson M.D.   On: 05/23/2021 21:46   MR ANGIO NECK W WO CONTRAST  Result Date: 05/23/2021 CLINICAL DATA:  Stroke follow-up EXAM: MRA NECK WITHOUT AND WITH CONTRAST TECHNIQUE: Multiplanar and multiecho pulse sequences of the neck were obtained without and with intravenous contrast. Angiographic images of the neck were obtained using MRA technique without and with intravenous contrast. CONTRAST:  7.21mL GADAVIST GADOBUTROL 1 MMOL/ML IV SOLN COMPARISON:  None. FINDINGS: There is 60% stenosis of the proximal right internal carotid artery. No stenosis, dissection or occlusion of the left carotid system or the vertebral arteries. IMPRESSION: 1. 60% stenosis of the proximal right internal carotid artery. 2. No stenosis, dissection or occlusion of the left carotid system or the vertebral arteries. Electronically Signed   By: Deatra Robinson M.D.   On: 05/23/2021 21:53   MR BRAIN WO CONTRAST  Result Date: 05/23/2021 CLINICAL DATA:  Week history of voice changes right upper extremity weakness. Abnormal CT of the head. EXAM: MRI HEAD WITHOUT  CONTRAST TECHNIQUE: Multiplanar, multiecho pulse sequences of the brain and surrounding structures were obtained without intravenous contrast. COMPARISON:  CT head without contrast 12/23/2020 FINDINGS: Brain: Acute nonhemorrhagic infarct involving left caudate head and anterior lentiform nucleus is confirmed. Other acute infarct is present. Periventricular and subcortical T2 hyperintensities are otherwise mildly advanced for age. Mild white matter changes extend into the brainstem. The ventricles are of normal size. No significant extraaxial fluid collection is present. The internal auditory canals are within normal limits. Cerebellum is unremarkable. Vascular: Flow is present in the major intracranial arteries. Skull and upper cervical spine: The craniocervical junction is normal. Upper cervical spine is within normal limits. Marrow signal is unremarkable. Sinuses/Orbits: Minimal fluid is present in the left mastoid air cells. The paranasal sinuses and mastoid air cells are otherwise clear. The globes and orbits are within normal limits. IMPRESSION: 1. Acute nonhemorrhagic infarct involving the left caudate head and anterior lentiform nucleus. 2. Periventricular and subcortical T2 hyperintensities bilaterally are mildly advanced for age. This likely reflects the sequela of chronic microvascular ischemia. Electronically Signed   By: Marin Roberts M.D.   On: 05/23/2021 18:48   ECHOCARDIOGRAM COMPLETE  Result Date: 05/24/2021    ECHOCARDIOGRAM REPORT   Patient Name:   Laura [name] Date of Exam: 05/24/2021 Medical Rec #:  [id number]    Height:       68.0 in Accession #:    [id number]   Weight:       130.0 lb Date of Birth:  Aug 20, 1951    BSA:          1.702 m Patient Age:    70 years     BP:           160/76 mmHg Patient Gender: F            HR:           92 bpm. Exam Location:  [facility] Procedure: 2D Echo, Color Doppler and Cardiac Doppler Indications:     I63.9 Stroke  History:         Patient has no prior  history of Echocardiogram examinations.                  Signs/Symptoms:Rt side facial droop.  Sonographer:     Humphrey Rolls RDCS (AE) Referring Phys:  [id number] AMY N COX Diagnosing Phys: Cristal Deer End  MD  Sonographer Comments: Suboptimal subcostal window. IMPRESSIONS  1. Left ventricular ejection fraction, by estimation, is 65 to 70%. The left ventricle has normal function. Left ventricular endocardial border not optimally defined to evaluate regional wall motion. Left ventricular diastolic parameters are consistent with Grade I diastolic dysfunction (impaired relaxation).  2. Right ventricular systolic function is normal. The right ventricular size is normal. Tricuspid regurgitation signal is inadequate for assessing PA pressure.  3. The mitral valve is grossly normal. Trivial mitral valve regurgitation. No evidence of mitral stenosis.  4. The aortic valve was not well visualized. Aortic valve regurgitation is mild to moderate.  5. The inferior vena cava is normal in size with greater than 50% respiratory variability, suggesting right atrial pressure of 3 mmHg. FINDINGS  Left Ventricle: Left ventricular ejection fraction, by estimation, is 65 to 70%. The left ventricle has normal function. Left ventricular endocardial border not optimally defined to evaluate regional wall motion. The left ventricular internal cavity size was normal in size. There is borderline left ventricular hypertrophy. Left ventricular diastolic parameters are consistent with Grade I diastolic dysfunction (impaired relaxation). Right Ventricle: The right ventricular size is normal. No increase in right ventricular wall thickness. Right ventricular systolic function is normal. Tricuspid regurgitation signal is inadequate for assessing PA pressure. Left Atrium: Left atrial size was normal in size. Right Atrium: Right atrial size was normal in size. Pericardium: There is no evidence of pericardial effusion. Mitral Valve: The mitral valve is  grossly normal. Trivial mitral valve regurgitation. No evidence of mitral valve stenosis. MV peak gradient, 2.8 mmHg. The mean mitral valve gradient is 2.0 mmHg. Tricuspid Valve: The tricuspid valve is not well visualized. Tricuspid valve regurgitation is trivial. Aortic Valve: The aortic valve was not well visualized. Aortic valve regurgitation is mild to moderate. Aortic valve mean gradient measures 3.0 mmHg. Aortic valve peak gradient measures 5.9 mmHg. Aortic valve area, by VTI measures 4.13 cm. Pulmonic Valve: The pulmonic valve was not well visualized. Pulmonic valve regurgitation is not visualized. No evidence of pulmonic stenosis. Aorta: The aortic root is normal in size and structure. Pulmonary Artery: The pulmonary artery is not well seen. Venous: The inferior vena cava is normal in size with greater than 50% respiratory variability, suggesting right atrial pressure of 3 mmHg. IAS/Shunts: The interatrial septum was not well visualized.  LEFT VENTRICLE PLAX 2D LVIDd:         3.10 cm     Diastology LVIDs:         2.30 cm     LV e' medial:    5.77 cm/s LV PW:         1.00 cm     LV E/e' medial:  8.2 LV IVS:        1.00 cm     LV e' lateral:   7.72 cm/s LVOT diam:     2.20 cm     LV E/e' lateral: 6.1 LV SV:         90 LV SV Index:   53 LVOT Area:     3.80 cm  LV Volumes (MOD) LV vol d, MOD A2C: 64.1 ml LV vol d, MOD A4C: 70.9 ml LV vol s, MOD A2C: 42.5 ml LV vol s, MOD A4C: 48.2 ml LV SV MOD A2C:     21.6 ml LV SV MOD A4C:     70.9 ml LV SV MOD BP:      24.6 ml RIGHT VENTRICLE RV Basal diam:  2.40 cm LEFT  ATRIUM             Index       RIGHT ATRIUM          Index LA diam:        3.20 cm 1.88 cm/m  RA Area:     9.04 cm 5.31 cm/m LA Vol (A2C):   24.7 ml 14.51 ml/m RA Volume:   11.10 ml 6.52 ml/m LA Vol (A4C):   28.4 ml 16.69 ml/m LA Biplane Vol: 27.0 ml 15.87 ml/m  AORTIC VALVE                   PULMONIC VALVE AV Area (Vmax):    3.83 cm    PV Vmax:       1.11 m/s AV Area (Vmean):   3.76 cm    PV  Vmean:      77.800 cm/s AV Area (VTI):     4.13 cm    PV VTI:        0.184 m AV Vmax:           121.00 cm/s PV Peak grad:  4.9 mmHg AV Vmean:          87.400 cm/s PV Mean grad:  3.0 mmHg AV VTI:            0.218 m AV Peak Grad:      5.9 mmHg AV Mean Grad:      3.0 mmHg LVOT Vmax:         122.00 cm/s LVOT Vmean:        86.400 cm/s LVOT VTI:          0.237 m LVOT/AV VTI ratio: 1.09  AORTA Ao Root diam: 3.00 cm MITRAL VALVE MV Area (PHT): 6.43 cm    SHUNTS MV Area VTI:   4.92 cm    Systemic VTI:  0.24 m MV Peak grad:  2.8 mmHg    Systemic Diam: 2.20 cm MV Mean grad:  2.0 mmHg MV Vmax:       0.84 m/s MV Vmean:      61.9 cm/s MV Decel Time: 118 msec MV E velocity: 47.40 cm/s MV A velocity: 81.20 cm/s MV E/A ratio:  0.58 Christopher End MD Electronically signed by Yvonne Kendall MD Signature Date/Time: 05/24/2021/1:09:40 PM    Final         Scheduled Meds:   stroke: mapping our early stages of recovery book   Does not apply Once   amLODipine  5 mg Oral Daily   aspirin EC  81 mg Oral Daily   atorvastatin  80 mg Oral QHS   enoxaparin (LOVENOX) injection  40 mg Subcutaneous Q24H   insulin aspart  0-9 Units Subcutaneous TID WC   vitamin B-12  1,000 mcg Oral Daily   Continuous Infusions:   LOS: 1 day    Time spent: 35 mins    Charise Killian, MD [facility] Pager [phone]  If 7PM-7AM, please contact night-coverage 05/25/2021, 7:23 AM

## 2021-05-25 NOTE — Plan of Care (Signed)
  Problem: Education: Goal: Knowledge of disease or condition will improve Outcome: Progressing Goal: Knowledge of secondary prevention will improve Outcome: Progressing Goal: Knowledge of patient specific risk factors addressed and post discharge goals established will improve Outcome: Progressing   Problem: Coping: Goal: Will verbalize positive feelings about self Outcome: Progressing Goal: Will identify appropriate support needs Outcome: Progressing   Problem: Self-Care: Goal: Ability to participate in self-care as condition permits will improve Outcome: Progressing Goal: Verbalization of feelings and concerns over difficulty with self-care will improve Outcome: Progressing Goal: Ability to communicate needs accurately will improve Outcome: Progressing   Problem: Health Behavior/Discharge Planning: Goal: Ability to manage health-related needs will improve Outcome: Progressing

## 2021-05-25 NOTE — Plan of Care (Signed)
Brief Neurology Update  Please see consult note 05/23/21 for full details. Stroke w/u complete. Patient may be discharged on ASA 81mg  daily + plavix 75mg  daily f/b ASA 81mg  monotherapy after that, as well as atorvastatin 80mg  daily. I will arrange for outpatient neurology f/u. Neurology to sign off, but please re-engage if additional neurologic concerns arise.  , MD [facility] [phone]  If 7pm- 7am, please page neurology on call as listed in [facility].

## 2021-05-25 NOTE — Progress Notes (Signed)
Chaplain Maggie delivered paperwork for Advanced [employer]ive to patient's room. Patient was eating lunch in bed and her older sister was seated at the window. Chaplain shared paperwork and made room for questions. Space was made for storytelling. Chaplain available per on call number for notarization and witness signature coordination.

## 2021-05-25 NOTE — PMR Pre-admission (Signed)
PMR Admission Coordinator Pre-Admission Assessment  Patient: Laura GurneyWanda [name] is an 70 y.o., female MRN: [id number] DOB: [birth year] Height: 5\' 8"  (172.7 cm) Weight: 59 kg  Insurance Information HMO:     PPO:      PCP:      IPA:      80/20:      OTHER:  PRIMARY: Medicare a and b      Policy#: [id number]      Subscriber: pt Benefits:  Phone #: passport one source     Name: 6/21 Eff. Date: 01/05/2017     Deduct: $1556      Out of Pocket Max: none      Life Max: none CIR: 100%      SNF: 20 full days Outpatient: 80%     Co-Pay: 20% Home Health: 100%      Co-Pay: per Medicare neccesity DME: 80%     Co-Pay: 20% Providers: patient choice  SECONDARY: [employer]      Policy#: [id number]      Financial Counselor:       Phone#:   The "Data Collection Information Summary" for patients in Inpatient Rehabilitation Facilities with attached "Privacy Act Statement-Health Care Records" was provided and verbally reviewed with: Patient and Family  Emergency Contact Information Contact Information     Name Relation Home Work Mobile   [name],[name] Sister   [phone]       Current Medical History  Patient Admitting Diagnosis: CVA  History of Present Illness: 70 year old female without any medical follow up in years on no prescribed meds pta. Presented on 05/23/2021 via private vehicle after sister had concerns after receiving a card on 6/18 with showed abnormal writing. Sister arrived to patient's home on evening of 05/22/21 and reported facial drooping and difficulty speaking. Card likely had been mailed on 6/15. Patient advised to go to hospital but refused until 6/19.  Patient with a right facial droop and right upper extremity weakness.  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  MRA brain was negative.  MRA neck shows a 60% stenosis right internal carotid which would not cause this patient's symptoms.  Seen by neurology.  Neurology  recommends aspirin indefinitely and Plavix for 21 days.  Continue Lipitor.  LDL 124.  Echocardiogram no signs of stroke and normal EF.  EKG normal sinus rhythm.  Type 2 diabetes mellitus with hyperlipidemia poorly controlled with a hemoglobin A1c of 12.0.  Started on low-dose Lantus insulin and short acting insulin prior to meals.  Initially will like to have on insulin but hopefully can convert over to pills once sugars are better controlled.  LDL 124.  Continue high-dose Lipitor.  Essential hypertension on Norvasc.Vitamin B12 deficiency.  Given dose of IM B12 and now on oral supplementation.  Recheck as outpatient.  Complete NIHSS TOTAL: 2  [facility] been reviewed by the rehabilitation admission coordinator and physician.  Past Medical History  History reviewed. No pertinent past medical history.  Family History   family history includes Diabetes in her mother; Heart failure in her father; Hypertension in her mother and sister; Stroke in her father.  Prior Rehab/Hospitalizations Has the patient had prior rehab or hospitalizations prior to admission? Yes  Has the patient had major surgery during 100 days prior to admission? No   Current Medications  Current Facility-Administered Medications:     stroke: mapping our early stages of recovery book, , Does not apply,  Once, Cox, Amy N, DO   acetaminophen (TYLENOL) tablet 650 mg, 650 mg, Oral, Q4H PRN **OR** acetaminophen (TYLENOL) 160 MG/5ML solution 650 mg, 650 mg, Per Tube, Q4H PRN **OR** acetaminophen (TYLENOL) suppository 650 mg, 650 mg, Rectal, Q4H PRN, Cox, Amy N, DO   amLODipine (NORVASC) tablet 5 mg, 5 mg, Oral, Daily, Cox, Amy N, DO, 5 mg at 05/26/21 0816   aspirin EC tablet 81 mg, 81 mg, Oral, Daily, Bhagat, Srishti L, MD, 81 mg at 05/26/21 0817   atorvastatin (LIPITOR) tablet 80 mg, 80 mg, Oral, QHS, Jefferson Fuel, MD, 80 mg at 05/25/21 2050   clopidogrel (PLAVIX) tablet 75 mg, 75 mg, Oral, Daily,  Wieting, Richard, MD   enoxaparin (LOVENOX) injection 40 mg, 40 mg, Subcutaneous, Q24H, Cox, Amy N, DO, 40 mg at 05/25/21 2051   insulin aspart (novoLOG) injection 0-5 Units, 0-5 Units, Subcutaneous, QHS, Charise Killian, MD, 3 Units at 05/25/21 2255   insulin aspart (novoLOG) injection 0-9 Units, 0-9 Units, Subcutaneous, TID WC, Charise Killian, MD, 3 Units at 05/26/21 2229   insulin aspart (novoLOG) injection 3 Units, 3 Units, Subcutaneous, TID WC, Charise Killian, MD, 3 Units at 05/26/21 0818   insulin glargine (LANTUS) injection 10 Units, 10 Units, Subcutaneous, Daily, Charise Killian, MD, 10 Units at 05/26/21 7989   metoprolol tartrate (LOPRESSOR) injection 5 mg, 5 mg, Intravenous, Q8H PRN, Charise Killian, MD   polyethylene glycol (MIRALAX / GLYCOLAX) packet 17 g, 17 g, Oral, Daily PRN, Wieting, Richard, MD   senna-docusate (Senokot-S) tablet 1 tablet, 1 tablet, Oral, QHS PRN, Cox, Amy N, DO   vitamin B-12 (CYANOCOBALAMIN) tablet 1,000 mcg, 1,000 mcg, Oral, Daily, Charise Killian, MD, 1,000 mcg at 05/26/21 2119  Patients Current Diet:  Diet Order             Diet Heart Room service appropriate? Yes; Fluid consistency: Thin  Diet effective now                   Precautions / Restrictions Precautions Precautions: Fall Restrictions Weight Bearing Restrictions: No   Has the patient had 2 or more falls or a fall with injury in the past year? No  Prior Activity Level Community (5-7x/wk): Independent and living alone  Prior Functional Level Self Care: Did the patient need help bathing, dressing, using the toilet or eating? Independent  Indoor Mobility: Did the patient need assistance with walking from room to room (with or without device)? Independent  Stairs: Did the patient need assistance with internal or external stairs (with or without device)? Independent  Functional Cognition: Did the patient need help planning regular tasks such as shopping or  remembering to take medications? Independent  Home Assistive Devices / Equipment Home Assistive Devices/Equipment: None Home Equipment: None  Prior Device Use: Indicate devices/aids used by the patient prior to current illness, exacerbation or injury? None of the above  Current Functional Level Cognition  Arousal/Alertness: Awake/alert Overall Cognitive Status: Impaired/Different from baseline Orientation Level: Oriented X4 Following Commands: Follows one step commands with increased time, Follows one step commands inconsistently Safety/Judgement: Decreased awareness of safety, Decreased awareness of deficits General Comments: word finding difficulty noted, requires visual/tactile cues for ADLs and safety Attention: Selective Selective Attention: Impaired Selective Attention Impairment: Verbal basic, Functional basic Memory: Appears intact (when selecting appropriate orientation answers in field of 2 written choices) Awareness: Appears intact Safety/Judgment: Impaired    Extremity Assessment (includes Sensation/Coordination)  Upper Extremity Assessment: RUE deficits/detail RUE Deficits /  Details: Impaired palm to finger tip translatory patterns, decreased pinch strength, functional grip defciits - pt noted to use gross grasp pattern for comb/toothbrush/pen RUE Sensation: WNL (SILT) RUE Coordination: decreased fine motor, decreased gross motor LUE Deficits / Details: MMT 5/5: Shoulder abd, elbow flex & tricep, grip LUE Sensation: WNL (SILT) LUE Coordination: WNL, decreased fine motor (Finger to Nose: delayed, inaccurate; finger opposition: delayed)  Lower Extremity Assessment: Defer to PT evaluation RLE Deficits / Details: MMT 5/5 hip flexion, knee extension, knee flexion, dorsiflexion, plantarflexion RLE Sensation: WNL (SILT) RLE Coordination: WNL (heel to shin: negative, alternating toe taps: negative) LLE Deficits / Details: MMT 5/5 hip flexion, knee extension, knee flexion,  dorsiflexion, plantarflexion LLE Sensation: WNL (SILT) LLE Coordination: WNL (heel to shin: negative, alternating toe taps: negative)    ADLs   completed pill box test with failing score. Pt requires cues to grasp medication bottles with R hand to assist with opening and drops bottles when not visually attending to R hand.    Mobility  Overal bed mobility: Needs Assistance Bed Mobility: Supine to Sit Supine to sit: Supervision General bed mobility comments: pt received and left in bed    Transfers  Overall transfer level: Needs assistance Equipment used: None Transfers: Sit to/from Stand Sit to Stand: Min guard General transfer comment: Utilizes 1 UE    Ambulation / Gait / Stairs / Psychologist, prison and probation services  Ambulation/Gait Ambulation/Gait assistance: Land (Feet): 30 Feet Assistive device: None Gait Pattern/deviations: Decreased stance time - right General Gait Details: Decreased step length, cadence; takes wider turns    Posture / Balance Dynamic Sitting Balance Sitting balance - Comments: Good control with dynamic movements Static Standing Balance Single Leg Stance - Right Leg:  (Less than 2 sec w/ supervision) Single Leg Stance - Left Leg:  (10 seconds w/ supervision) Balance Overall balance assessment: Needs assistance Sitting-balance support: No upper extremity supported, Feet supported Sitting balance-Leahy Scale: Good Sitting balance - Comments: Good control with dynamic movements Standing balance support: No upper extremity supported, During functional activity Standing balance-Leahy Scale: Fair Standing balance comment: Able to catch, toss a ball w/ LOB x 1 requiring UE support Single Leg Stance - Right Leg:  (Less than 2 sec w/ supervision) Single Leg Stance - Left Leg:  (10 seconds w/ supervision)    Special needs/care consideration New DM@ dx with Hgb A1c of 12.0    Previous Home Environment  Living Arrangements: Alone  Lives With:  Alone Available Help at Discharge: Family, Available 24 hours/day (sister [name] can arrange 24/7 supervision) Type of Home: House Home Layout: Two level, Able to live on main level with bedroom/bathroom (does not utilize 2nd floor) Home Access: Level entry Bathroom Shower/Tub: Psychologist, counselling, Door [employer] Toilet: Standard Bathroom Accessibility: Yes How Accessible: Accessible via walker Home Care Services: No Additional Comments: Per sisters in room, pt does not have working phone and no email address  Discharge Living Setting Plans for Discharge Living Setting: Patient's home (patiricia will provide 24/7 care in pt's home or patietn can stay with her) Type of Home at Discharge: House Discharge Home Layout: Two level, Able to live on main level with bedroom/bathroom Discharge Home Access: Level entry Discharge Bathroom Shower/Tub: Walk-in shower, Tub/shower unit Discharge Bathroom Toilet: Standard Discharge Bathroom Accessibility: Yes How Accessible: Accessible via walker Does the patient have any problems obtaining your medications?: No  Social/Family/Support Systems Contact Information: sister, [name] Anticipated Caregiver: sister Patiricai and her other 2 sisters in [place] and [place] Anticipated  Caregiver's Contact Information: see above Ability/Limitations of Caregiver: none Caregiver Availability: 24/7 Discharge Plan Discussed with Primary Caregiver: Yes Is Caregiver In Agreement with Plan?: Yes Does Caregiver/Family have Issues with Lodging/Transportation while Pt is in Rehab?: No  Goals Patient/Family Goal for Rehab: Mod I to supervision with PT, OT and SLP Expected length of stay: ELOS 7 to 10 days Pt/Family Agrees to Admission and willing to participate: Yes Program Orientation Provided & Reviewed with Pt/Caregiver Including Roles  & Responsibilities: Yes  Decrease burden of Care through IP rehab admission: n/a  Possible need for SNF placement upon  discharge: not anticipated  Patient Condition: I have reviewed medical records from [facility], spoken with CM, and patient and family member. I discussed via phone for inpatient rehabilitation assessment.  Patient will benefit from ongoing PT, OT, and SLP, can actively participate in 3 hours of therapy a day 5 days of the week, and can make measurable gains during the admission.  Patient will also benefit from the coordinated team approach during an Inpatient Acute Rehabilitation admission.  The patient will receive intensive therapy as well as Rehabilitation physician, nursing, social worker, and care management interventions.  Due to bladder management, bowel management, safety, skin/wound care, disease management, medication administration, pain management, and patient education the patient requires 24 hour a day rehabilitation nursing.  The patient is currently min guard assist overall with mobility and basic ADLs.  Discharge setting and therapy post discharge at home with outpatient is anticipated.  Patient has agreed to participate in the Acute Inpatient Rehabilitation Program and will admit today.  Preadmission Screen Completed By:  Clois Dupes, 05/26/2021 10:18 AM ______________________________________________________________________   Discussed status with Dr. Carlis Abbott on  05/26/2021 at 1019 and received approval for admission today.  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 Yes Co-Morbidities requiring supervision/potential complications: Type 2 DM, HTN, B12 deficiency, HLD, cognitive impairment Due to bladder management, bowel management, safety, skin/wound care,  disease management, medication administration, pain management, and patient education, does the patient require 24 hr/day rehab nursing? Yes Does the patient require coordinated care of a physician, rehab nurse, PT, OT, and SLP to address physical and functional deficits in the context of the above medical diagnosis(es)? Yes Addressing deficits in the following areas: balance, endurance, locomotion, strength, transferring, bowel/bladder control, bathing, dressing, feeding, grooming, toileting, cognition, and psychosocial support Can the patient actively participate in an intensive therapy program of at least 3 hrs of therapy 5 days a week? Yes The potential for patient to make measurable gains while on inpatient rehab is excellent Anticipated functional outcomes upon discharge from inpatient rehab: supervision PT, supervision OT, supervision SLP Estimated rehab length of stay to reach the above functional goals is: 5-7 days Anticipated discharge destination: Home 10. Overall Rehab/Functional Prognosis: excellent   MD Signature: Sula Soda, MD

## 2021-05-25 NOTE — Progress Notes (Signed)
Occupational Therapy Treatment Patient Details Name: Laura [name] MRN: [id number] DOB: [birth year] Today's Date: 05/25/2021    History of present illness Pt is a 70 y.o. 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 PMH is not documented.   OT comments  Laura [name] was seen for OT treatment on this date. Upon arrival to room pt reclined in chair with sister present in room. Pt requires SETUP + MIN cues to don/doff B socks seated EOB. MOD cues + CGA tooth brushing standing sinkside - cues for initiation, integration of dominant R hand, and sequencing. Pt completed seated therex as described below - demonstrated improved command following and termination of tasks with cues to count out loud.   Pt completed Pill Box Test with a failing score. The Pill Box test assesses a pt's ability to accurately follow common medication bottle instructions to fill a 1 week pill box. It assesses a pt's ability to plan, self-monitor, volition, and executive function. Pt demonstrated deficits in planning and self-monitoring. After reading medication label stating "take 1 tablet 2 times daily with breakfast and dinner" pt placed 2 pills in breakfast box, unable to recognize error with MIN cueing.  Pt requires cues to grasp medication bottles with R hand to assist with opening, pt drops bottles when not visually attending to R hand.   Pt continues to demonstrate significant dominant RUE coordination and FMC deficits limiting ability to complete ADLs independently. Pt making progress toward goals and family receptive to all education. Pt continues to benefit from skilled OT services to maximize return to PLOF and minimize risk of future falls, injury, caregiver burden, and readmission. Will continue to follow POC. Discharge recommendation remains appropriate.    Follow Up Recommendations  CIR    Equipment Recommendations  3 in 1 bedside commode    Recommendations for Other Services       Precautions / Restrictions Precautions Precautions: Fall Restrictions Weight Bearing Restrictions: No       Mobility Bed Mobility               General bed mobility comments: pt received and left in bed    Transfers Overall transfer level: Needs assistance Equipment used: None Transfers: Sit to/from Stand Sit to Stand: Min guard         General transfer comment: Utilizes 1 UE    Balance Overall balance assessment: Needs assistance Sitting-balance support: No upper extremity supported;Feet supported Sitting balance-Leahy Scale: Good   Standing balance support: No upper extremity supported;During functional activity Standing balance-Leahy Scale: Fair                           ADL either performed or assessed with clinical judgement   ADL Overall ADL's : Needs assistance/impaired                                       General ADL Comments: SETUP + MIN cues to don/doff B socks seated EOB. MOD cues + CGA tooth brushing standing sinkside - cues for initiation, integration of dominant R hand, and sequencing. Pt completed pill box test with failing score. Pt requires cues to grasp medication bottles with R hand to assist with opening and drops bottles when not visually attending to R hand.      Cognition Arousal/Alertness: Awake/alert Behavior During Therapy: WFL for tasks assessed/performed Overall  Cognitive Status: Impaired/Different from baseline Area of Impairment: Following commands;Problem solving;Safety/judgement                       Following Commands: Follows one step commands with increased time;Follows one step commands inconsistently Safety/Judgement: Decreased awareness of safety;Decreased awareness of deficits   Problem Solving: Requires verbal cues;Requires tactile cues General Comments: word finding difficulty noted, requires visual/tactile cues for ADLs and safety        Exercises Exercises: Other  exercises Other Exercises Other Exercises: Pt and family educated re: HEP, importance of incorporating R hand in ADLs, d/c recs, falls prevention Other Exercises: LBD, tooth brushing, sit<>stand, sitting/standing balance/tolerance, ~20 ft mobility, pill box test      General Comments HR 137 standing tooth brushing, resolved to 120s sitting, 110s at rest    Pertinent Vitals/ Pain       Pain Assessment: No/denies pain         Frequency  Min 2X/week        Progress Toward Goals  OT Goals(current goals can now be found in the care plan section)  Progress towards OT goals: Progressing toward goals  Acute Rehab OT Goals Patient Stated Goal: Improve mobility to return home with her animals OT Goal Formulation: With patient/family Time For Goal Achievement: 06/07/21 Potential to Achieve Goals: Good ADL Goals Pt Will Perform Grooming: Independently;standing Pt Will Transfer to Toilet: Independently;ambulating;regular height toilet Additional ADL Goal #1: Pt will Independently verbalize plan to implement x3 falls prevention strategies  Plan Discharge plan remains appropriate;Frequency remains appropriate       AM-PAC OT "6 Clicks" Daily Activity     Outcome Measure   Help from another person eating meals?: A Little Help from another person taking care of personal grooming?: A Little Help from another person toileting, which includes using toliet, bedpan, or urinal?: A Little Help from another person bathing (including washing, rinsing, drying)?: A Little Help from another person to put on and taking off regular upper body clothing?: A Little Help from another person to put on and taking off regular lower body clothing?: A Little 6 Click Score: 18    End of Session    OT Visit Diagnosis: Other abnormalities of gait and mobility (R26.89)   Activity Tolerance Patient tolerated treatment well   Patient Left in chair;with call bell/phone within reach;with family/visitor  present   Nurse Communication          Time: 0981-1914 OT Time Calculation (min): 38 min  Charges: OT General Charges $OT Visit: 1 Visit OT Treatments $Self Care/Home Management : 23-37 mins $Therapeutic Exercise: 8-22 mins  Kathie Dike, M.S. OTR/L  05/25/21, 2:17 PM  ascom [phone]

## 2021-05-25 NOTE — Progress Notes (Signed)
Attempted to get patient to administer insulin injections today; however she denied. She states she is still "quite overwhelmed" with the diagnosis. Encouraged to start self injections tomorrow. Family support 24/7 at bedside.

## 2021-05-25 NOTE — Progress Notes (Addendum)
Inpatient Diabetes Program Recommendations  AACE/ADA: New Consensus Statement on Inpatient Glycemic Control (2015)  Target Ranges:  Prepandial:   less than 140 mg/dL      Peak postprandial:   less than 180 mg/dL (1-2 hours)      Critically ill patients:  140 - 180 mg/dL   Lab Results  Component Value Date   GLUCAP 339 (H) 05/25/2021   HGBA1C 12.0 (H) 05/23/2021    Review of Glycemic Control  Diabetes history: New Onset Current orders for Inpatient glycemic control: Novolog 0-9 tid  Inpatient Diabetes Program Recommendations:   Consider: -Lantus 10 units now and q day -Add Novolog 3 units tid meal coverage if eats 50% -Add Novolog -5 units hs Secure chat sent to Dr. Jimmye Norman. Ordered Living Well With Diabetes book, insulin pen starter kit, consult to dietician and will plan to speak with patient today.  Patient sleeping soundly when attempted to discuss new onset with patient. Spoke with sister @ bedside who is very supportive of assisting with her sister's care along with her 2 other sisters. Sent a video link to the sister's phone which she plans to share with other sisters regarding insulin pen administration. Ordered syringe starter kits as well as insulin pen to help determine which will be best for patient. Will need glucose meter # [id number] on discharge and will follow for other needs.   Thank you, Nani Gasser. Annamay Laymon, RN, MSN, CDE  Diabetes Coordinator Inpatient Glycemic Control Team Team Pager [phone] (8am-5pm) 05/25/2021 12:22 PM

## 2021-05-26 ENCOUNTER — Encounter: Payer: Self-pay | Admitting: Internal Medicine

## 2021-05-26 ENCOUNTER — Other Ambulatory Visit: Payer: Self-pay

## 2021-05-26 ENCOUNTER — Encounter (HOSPITAL_COMMUNITY): Payer: Self-pay | Admitting: Physical Medicine and Rehabilitation

## 2021-05-26 ENCOUNTER — Inpatient Hospital Stay (HOSPITAL_COMMUNITY)
Admission: RE | Admit: 2021-05-26 | Discharge: 2021-06-02 | DRG: 057 | Disposition: A | Discharge status: Home / Self Care | Payer: Medicare Other | Source: Other Acute Inpatient Hospital | Attending: Physical Medicine and Rehabilitation | Admitting: Physical Medicine and Rehabilitation

## 2021-05-26 DIAGNOSIS — R2689 Other abnormalities of gait and mobility: Secondary | ICD-10-CM | POA: Diagnosis not present

## 2021-05-26 DIAGNOSIS — E785 Hyperlipidemia, unspecified: Secondary | ICD-10-CM | POA: Diagnosis not present

## 2021-05-26 DIAGNOSIS — E119 Type 2 diabetes mellitus without complications: Secondary | ICD-10-CM | POA: Diagnosis not present

## 2021-05-26 DIAGNOSIS — R54 Age-related physical debility: Secondary | ICD-10-CM | POA: Diagnosis present

## 2021-05-26 DIAGNOSIS — I10 Essential (primary) hypertension: Secondary | ICD-10-CM | POA: Diagnosis present

## 2021-05-26 DIAGNOSIS — I6300 Cerebral infarction due to thrombosis of unspecified precerebral artery: Secondary | ICD-10-CM | POA: Diagnosis not present

## 2021-05-26 DIAGNOSIS — N179 Acute kidney failure, unspecified: Secondary | ICD-10-CM | POA: Diagnosis not present

## 2021-05-26 DIAGNOSIS — I69322 Dysarthria following cerebral infarction: Secondary | ICD-10-CM

## 2021-05-26 DIAGNOSIS — I639 Cerebral infarction, unspecified: Secondary | ICD-10-CM

## 2021-05-26 DIAGNOSIS — Z79899 Other long term (current) drug therapy: Secondary | ICD-10-CM | POA: Diagnosis not present

## 2021-05-26 DIAGNOSIS — E1165 Type 2 diabetes mellitus with hyperglycemia: Secondary | ICD-10-CM | POA: Diagnosis not present

## 2021-05-26 DIAGNOSIS — I69321 Dysphasia following cerebral infarction: Secondary | ICD-10-CM | POA: Diagnosis not present

## 2021-05-26 DIAGNOSIS — E538 Deficiency of other specified B group vitamins: Secondary | ICD-10-CM | POA: Diagnosis not present

## 2021-05-26 DIAGNOSIS — I69398 Other sequelae of cerebral infarction: Secondary | ICD-10-CM | POA: Diagnosis present

## 2021-05-26 DIAGNOSIS — Z823 Family history of stroke: Secondary | ICD-10-CM | POA: Diagnosis not present

## 2021-05-26 DIAGNOSIS — I69320 Aphasia following cerebral infarction: Secondary | ICD-10-CM

## 2021-05-26 DIAGNOSIS — I69998 Other sequelae following unspecified cerebrovascular disease: Secondary | ICD-10-CM | POA: Diagnosis not present

## 2021-05-26 DIAGNOSIS — I69318 Other symptoms and signs involving cognitive functions following cerebral infarction: Secondary | ICD-10-CM | POA: Diagnosis not present

## 2021-05-26 DIAGNOSIS — Z8249 Family history of ischemic heart disease and other diseases of the circulatory system: Secondary | ICD-10-CM | POA: Diagnosis not present

## 2021-05-26 DIAGNOSIS — R000 Tachycardia, unspecified: Secondary | ICD-10-CM | POA: Diagnosis present

## 2021-05-26 DIAGNOSIS — E118 Type 2 diabetes mellitus with unspecified complications: Secondary | ICD-10-CM | POA: Diagnosis not present

## 2021-05-26 DIAGNOSIS — E1169 Type 2 diabetes mellitus with other specified complication: Secondary | ICD-10-CM | POA: Diagnosis not present

## 2021-05-26 DIAGNOSIS — Z794 Long term (current) use of insulin: Secondary | ICD-10-CM

## 2021-05-26 DIAGNOSIS — E559 Vitamin D deficiency, unspecified: Secondary | ICD-10-CM | POA: Diagnosis not present

## 2021-05-26 DIAGNOSIS — Z833 Family history of diabetes mellitus: Secondary | ICD-10-CM | POA: Diagnosis not present

## 2021-05-26 HISTORY — DX: Cerebral infarction, unspecified: I63.9

## 2021-05-26 LAB — BASIC METABOLIC PANEL
Anion gap: 5 (ref 5–15)
BUN: 20 mg/dL (ref 8–23)
CO2: 30 mmol/L (ref 22–32)
Calcium: 9.1 mg/dL (ref 8.9–10.3)
Chloride: 102 mmol/L (ref 98–111)
Creatinine, Ser: 0.97 mg/dL (ref 0.44–1.00)
GFR, Estimated: >60 mL/min (ref >60)
Glucose, Bld: 233 mg/dL — ABNORMAL HIGH (ref 70–99)
Potassium: 4.6 mmol/L (ref 3.5–5.1)
Sodium: 137 mmol/L (ref 135–145)

## 2021-05-26 LAB — CBC
HCT: 40.7 % (ref 36.0–46.0)
Hemoglobin: 13.8 g/dL (ref 12.0–15.0)
MCH: 28.5 pg (ref 26.0–34.0)
MCHC: 33.9 g/dL (ref 30.0–36.0)
MCV: 84.1 fL (ref 80.0–100.0)
Platelets: 292 10*3/uL (ref 150–400)
RBC: 4.84 MIL/uL (ref 3.87–5.11)
RDW: 12.1 % (ref 11.5–15.5)
WBC: 7.1 10*3/uL (ref 4.0–10.5)
nRBC: 0 % (ref 0.0–0.2)

## 2021-05-26 LAB — GLUCOSE, CAPILLARY
Glucose-Capillary: 210 mg/dL — ABNORMAL HIGH (ref 70–99)
Glucose-Capillary: 280 mg/dL — ABNORMAL HIGH (ref 70–99)
Glucose-Capillary: 185 mg/dL — ABNORMAL HIGH (ref 70–99)
Glucose-Capillary: 330 mg/dL — ABNORMAL HIGH (ref 70–99)

## 2021-05-26 MED ORDER — ASPIRIN 81 MG PO TBEC: 81.0000 mg | DELAYED_RELEASE_TABLET | Freq: Every day | ORAL | 11 refills | Status: DC

## 2021-05-26 MED ORDER — FLEET ENEMA 7-19 GM/118ML RE ENEM: 1.0000 | ENEMA | Freq: Once | RECTAL | Status: DC | PRN

## 2021-05-26 MED ORDER — BLOOD PRESSURE CONTROL BOOK
Freq: Once | Status: AC
  Filled 2021-05-26: qty 1

## 2021-05-26 MED ORDER — INSULIN ASPART 100 UNIT/ML FLEXPEN: 3.0000 [IU] | PEN_INJECTOR | Freq: Three times a day (TID) | SUBCUTANEOUS | 0 refills | Status: DC

## 2021-05-26 MED ORDER — ATORVASTATIN CALCIUM 80 MG PO TABS
80.0000 mg | ORAL_TABLET | Freq: Every day | ORAL | Status: DC
  Administered 2021-05-26 – 2021-06-01 (×7): 80 mg via ORAL
  Filled 2021-05-26 (×7): qty 1

## 2021-05-26 MED ORDER — INSULIN GLARGINE 100 UNIT/ML ~~LOC~~ SOLN
10.0000 [IU] | Freq: Every day | SUBCUTANEOUS | Status: DC
  Filled 2021-05-26: qty 0.1

## 2021-05-26 MED ORDER — ENOXAPARIN SODIUM 40 MG/0.4ML IJ SOSY
40.0000 mg | PREFILLED_SYRINGE | INTRAMUSCULAR | Status: DC
  Administered 2021-05-26 – 2021-05-27 (×2): 40 mg via SUBCUTANEOUS
  Filled 2021-05-26 (×2): qty 0.4

## 2021-05-26 MED ORDER — POLYETHYLENE GLYCOL 3350 17 G PO PACK: 17.0000 g | PACK | Freq: Every day | ORAL | 0 refills | Status: DC | PRN

## 2021-05-26 MED ORDER — ALUM & MAG HYDROXIDE-SIMETH 200-200-20 MG/5ML PO SUSP: 30.0000 mL | ORAL | Status: DC | PRN

## 2021-05-26 MED ORDER — CLOPIDOGREL BISULFATE 75 MG PO TABS
75.0000 mg | ORAL_TABLET | Freq: Every day | ORAL | Status: DC
  Administered 2021-05-26: 75 mg via ORAL
  Filled 2021-05-26: qty 1

## 2021-05-26 MED ORDER — INSULIN ASPART 100 UNIT/ML IJ SOLN
3.0000 [IU] | Freq: Three times a day (TID) | INTRAMUSCULAR | Status: DC
  Administered 2021-05-26 – 2021-06-01 (×17): 3 [IU] via SUBCUTANEOUS

## 2021-05-26 MED ORDER — INSULIN ASPART 100 UNIT/ML IJ SOLN
0.0000 [IU] | Freq: Three times a day (TID) | INTRAMUSCULAR | Status: DC
  Administered 2021-05-26: 2 [IU] via SUBCUTANEOUS
  Administered 2021-05-27: 3 [IU] via SUBCUTANEOUS
  Administered 2021-05-27 – 2021-05-28 (×3): 5 [IU] via SUBCUTANEOUS
  Administered 2021-05-28: 3 [IU] via SUBCUTANEOUS
  Administered 2021-05-28 – 2021-05-29 (×2): 2 [IU] via SUBCUTANEOUS
  Administered 2021-05-29: 5 [IU] via SUBCUTANEOUS
  Administered 2021-05-29 – 2021-05-30 (×2): 2 [IU] via SUBCUTANEOUS
  Administered 2021-05-30: 5 [IU] via SUBCUTANEOUS
  Administered 2021-05-30 – 2021-05-31 (×2): 3 [IU] via SUBCUTANEOUS
  Administered 2021-05-31 (×2): 2 [IU] via SUBCUTANEOUS
  Administered 2021-06-01 (×3): 3 [IU] via SUBCUTANEOUS
  Administered 2021-06-02: 2 [IU] via SUBCUTANEOUS

## 2021-05-26 MED ORDER — EXERCISE FOR HEART AND HEALTH BOOK
Freq: Once | Status: AC
  Filled 2021-05-26: qty 1

## 2021-05-26 MED ORDER — TRAZODONE HCL 50 MG PO TABS: 25.0000 mg | ORAL_TABLET | Freq: Every evening | ORAL | Status: DC | PRN

## 2021-05-26 MED ORDER — AMLODIPINE BESYLATE 5 MG PO TABS
5.0000 mg | ORAL_TABLET | Freq: Every day | ORAL | Status: DC
  Administered 2021-05-27 – 2021-06-02 (×7): 5 mg via ORAL
  Filled 2021-05-26 (×7): qty 1

## 2021-05-26 MED ORDER — CLOPIDOGREL BISULFATE 75 MG PO TABS
75.0000 mg | ORAL_TABLET | Freq: Every day | ORAL | Status: DC
  Administered 2021-05-27 – 2021-06-02 (×7): 75 mg via ORAL
  Filled 2021-05-26 (×7): qty 1

## 2021-05-26 MED ORDER — ACETAMINOPHEN 325 MG PO TABS: 325.0000 mg | ORAL_TABLET | ORAL | Status: DC | PRN

## 2021-05-26 MED ORDER — CYANOCOBALAMIN 1000 MCG PO TABS: 1000.0000 ug | ORAL_TABLET | Freq: Every day | ORAL | 0 refills | Status: DC

## 2021-05-26 MED ORDER — INSULIN ASPART 100 UNIT/ML IJ SOLN
0.0000 [IU] | Freq: Every day | INTRAMUSCULAR | Status: DC
  Administered 2021-05-26: 4 [IU] via SUBCUTANEOUS
  Administered 2021-05-27: 3 [IU] via SUBCUTANEOUS
  Administered 2021-05-28: 2 [IU] via SUBCUTANEOUS
  Administered 2021-05-29: 3 [IU] via SUBCUTANEOUS
  Administered 2021-05-30 – 2021-06-01 (×3): 2 [IU] via SUBCUTANEOUS

## 2021-05-26 MED ORDER — INSULIN GLARGINE 100 UNIT/ML SOLOSTAR PEN: 10.0000 [IU] | PEN_INJECTOR | Freq: Every day | SUBCUTANEOUS | 0 refills | Status: DC

## 2021-05-26 MED ORDER — VITAMIN B-12 1000 MCG PO TABS
1000.0000 ug | ORAL_TABLET | Freq: Every day | ORAL | Status: DC
  Administered 2021-05-27 – 2021-06-02 (×7): 1000 ug via ORAL
  Filled 2021-05-26 (×7): qty 1

## 2021-05-26 MED ORDER — PROCHLORPERAZINE 25 MG RE SUPP: 12.5000 mg | Freq: Four times a day (QID) | RECTAL | Status: DC | PRN

## 2021-05-26 MED ORDER — CLOPIDOGREL BISULFATE 75 MG PO TABS: 75.0000 mg | ORAL_TABLET | Freq: Every day | ORAL | 0 refills | Status: DC

## 2021-05-26 MED ORDER — ATORVASTATIN CALCIUM 80 MG PO TABS: 80.0000 mg | ORAL_TABLET | Freq: Every evening | ORAL | 0 refills | Status: DC

## 2021-05-26 MED ORDER — ASPIRIN EC 81 MG PO TBEC
81.0000 mg | DELAYED_RELEASE_TABLET | Freq: Every day | ORAL | Status: DC
  Administered 2021-05-27 – 2021-06-02 (×7): 81 mg via ORAL
  Filled 2021-05-26 (×7): qty 1

## 2021-05-26 MED ORDER — POLYETHYLENE GLYCOL 3350 17 G PO PACK
17.0000 g | PACK | Freq: Every day | ORAL | Status: DC | PRN
  Administered 2021-05-27: 17 g via ORAL
  Filled 2021-05-26: qty 1

## 2021-05-26 MED ORDER — ENSURE ENLIVE PO LIQD
237.0000 mL | Freq: Two times a day (BID) | ORAL | Status: DC
  Administered 2021-05-26: 237 mL via ORAL

## 2021-05-26 MED ORDER — PROCHLORPERAZINE MALEATE 5 MG PO TABS: 5.0000 mg | ORAL_TABLET | Freq: Four times a day (QID) | ORAL | Status: DC | PRN

## 2021-05-26 MED ORDER — LIVING WELL WITH DIABETES BOOK
Freq: Once | Status: AC
  Filled 2021-05-26: qty 1

## 2021-05-26 MED ORDER — GUAIFENESIN-DM 100-10 MG/5ML PO SYRP
5.0000 mL | ORAL_SOLUTION | Freq: Four times a day (QID) | ORAL | Status: DC | PRN
  Filled 2021-05-26: qty 10

## 2021-05-26 MED ORDER — POLYETHYLENE GLYCOL 3350 17 G PO PACK: 17.0000 g | PACK | Freq: Every day | ORAL | Status: DC | PRN

## 2021-05-26 MED ORDER — PROCHLORPERAZINE EDISYLATE 10 MG/2ML IJ SOLN: 5.0000 mg | Freq: Four times a day (QID) | INTRAMUSCULAR | Status: DC | PRN

## 2021-05-26 MED ORDER — BISACODYL 10 MG RE SUPP
10.0000 mg | Freq: Every day | RECTAL | Status: DC | PRN
  Administered 2021-05-28: 10 mg via RECTAL
  Filled 2021-05-26: qty 1

## 2021-05-26 MED ORDER — AMLODIPINE BESYLATE 5 MG PO TABS: 5.0000 mg | ORAL_TABLET | Freq: Every day | ORAL | 0 refills | Status: DC

## 2021-05-26 MED ORDER — DIPHENHYDRAMINE HCL 12.5 MG/5ML PO ELIX: 12.5000 mg | ORAL_SOLUTION | Freq: Four times a day (QID) | ORAL | Status: DC | PRN

## 2021-05-26 NOTE — Discharge Instructions (Addendum)
Inpatient Rehab Discharge Instructions  Laura [name] Discharge date and time: 06/02/21   Activities/Precautions/ Functional Status: Activity: no lifting, driving, or strenuous exercise for till cleared by MD Diet: cardiac diet and diabetic diet Wound Care: none needed   Functional status:  ___ No restrictions     ___ Walk up steps independently _X__ 24/7 supervision/assistance   ___ Walk up steps with assistance ___ Intermittent supervision/assistance  ___ Bathe/dress independently ___ Walk with walker     ___ Bathe/dress with assistance ___ Walk Independently    ___ Shower independently ___ Walk with assistance    _X__ Shower with assistance __X_ No alcohol     ___ Return to work/school ________  Special Instructions: [facility] will call you with appointment for outpatient therapy.   STROKE/TIA DISCHARGE INSTRUCTIONS SMOKING Cigarette smoking nearly doubles your risk of having a stroke & is the single most alterable risk factor  If you smoke or have smoked in the last 12 months, you are advised to quit smoking for your health. Most of the excess cardiovascular risk related to smoking disappears within a year of stopping. Ask you doctor about anti-smoking medications [place] Quit Line: 1-800-QUIT NOW Free Smoking Cessation Classes [phone]  CHOLESTEROL Know your levels; limit fat & cholesterol in your diet  Lipid Panel     Component Value Date/Time   CHOL 192 05/24/2021 0631   TRIG 203 (H) 05/24/2021 0631   HDL 27 (L) 05/24/2021 0631   CHOLHDL 7.1 05/24/2021 0631   VLDL 41 (H) 05/24/2021 0631   LDLCALC 124 (H) 05/24/2021 0631     Many patients benefit from treatment even if their cholesterol is at goal. Goal: Total Cholesterol (CHOL) less than 160 Goal:  Triglycerides (TRIG) less than 150 Goal:  HDL greater than 40 Goal:  LDL (LDLCALC) less than 100   BLOOD PRESSURE American Stroke Association blood pressure target is less that 120/80 mm/Hg  Your discharge blood  pressure is:  BP: (!) 154/72 Monitor your blood pressure Limit your salt and alcohol intake Many individuals will require more than one medication for high blood pressure  DIABETES (A1c is a blood sugar average for last 3 months) Goal HGBA1c is under 7% (HBGA1c is blood sugar average for last 3 months)  Diabetes:     Lab Results  Component Value Date   HGBA1C 12.0 (H) 05/23/2021    Your HGBA1c can be lowered with medications, healthy diet, and exercise. Check your blood sugar as directed by your physician Call your physician if you experience unexplained or low blood sugars.  PHYSICAL ACTIVITY/REHABILITATION Goal is 30 minutes at least 4 days per week  Activity: No driving, Therapies:  Return to work: N/A Activity decreases your risk of heart attack and stroke and makes your heart stronger.  It helps control your weight and blood pressure; helps you relax and can improve your mood. Participate in a regular exercise program. Talk with your doctor about the best form of exercise for you (dancing, walking, swimming, cycling).  DIET/WEIGHT Goal is to maintain a healthy weight  Your discharge diet is:  Diet Order             Diet heart healthy/carb modified Room service appropriate? Yes; Fluid consistency: Thin  Diet effective now                   liquids Your height is:  Height: 5\' 8"  (172.7 cm) Your current weight is: Weight: 71.6 kg Your Body Mass Index (BMI)  is:  BMI (Calculated): 24.01 Following the type of diet specifically designed for you will help prevent another stroke. You are at goal weight Your goal Body Mass Index (BMI) is 19-24. Healthy food habits can help reduce 3 risk factors for stroke:  High cholesterol, hypertension, and excess weight.  RESOURCES Stroke/Support Group:  Call [phone]   STROKE EDUCATION PROVIDED/REVIEWED AND GIVEN TO PATIENT Stroke warning signs and symptoms How to activate emergency medical system (call 911). Medications prescribed at  discharge. Need for follow-up after discharge. Personal risk factors for stroke. Pneumonia vaccine given:  Flu vaccine given:  My questions have been answered, the writing is legible, and I understand these instructions.  I will adhere to these goals & educational materials that have been provided to me after my discharge from the hospital.        My questions have been answered and I understand these instructions. I will adhere to these goals and the provided educational materials after my discharge from the hospital.  Patient/Caregiver Signature _______________________________ Date __________  Clinician Signature _______________________________________ Date __________  Please bring this form and your medication list with you to all your follow-up doctor's appointments.  Carbohydrate Counting For People With Diabetes  Foods with carbohydrates make your blood glucose level go up. Learning how to count carbohydrates can help you control your blood glucose levels. First, identify the foods you eat that contain carbohydrates. Then, using the Foods with Carbohydrates chart, determine about how much carbohydrates are in your meals and snacks. Make sure you are eating foods with fiber, protein, and healthy fat along with your carbohydrate foods. Foods with Carbohydrates The following table shows carbohydrate foods that have about 15 grams of carbohydrate each. Using measuring cups, spoons, or a food scale when you first begin learning about carbohydrate counting can help you learn about the portion sizes you typically eat. The following foods have 15 grams carbohydrate each:  Grains 1 slice bread (1 ounce)  1 small tortilla (6-inch size)   large bagel (1 ounce)  1/3 cup pasta or rice (cooked)   hamburger or hot dog bun ( ounce)   cup cooked cereal   to  cup ready-to-eat cereal  2 taco shells (5-inch size) Fruit 1 small fresh fruit ( to 1 cup)   medium banana  17 small grapes (3  ounces)  1 cup melon or berries   cup canned or frozen fruit  2 tablespoons dried fruit (blueberries, cherries, cranberries, raisins)   cup unsweetened fruit juice  Starchy Vegetables  cup cooked beans, peas, corn, potatoes/sweet potatoes   large baked potato (3 ounces)  1 cup acorn or butternut squash  Snack Foods 3 to 6 crackers  8 potato chips or 13 tortilla chips ( ounce to 1 ounce)  3 cups popped popcorn  Dairy 3/4 cup (6 ounces) nonfat plain yogurt, or yogurt with sugar-free sweetener  1 cup milk  1 cup plain rice, soy, coconut or flavored almond milk Sweets and Desserts  cup ice cream or frozen yogurt  1 tablespoon jam, jelly, pancake syrup, table sugar, or honey  2 tablespoons light pancake syrup  1 inch square of frosted cake or 2 inch square of unfrosted cake  2 small cookies (2/3 ounce each) or  large cookie  Sometimes you'll have to estimate carbohydrate amounts if you don't know the exact recipe. One cup of mixed foods like soups can have 1 to 2 carbohydrate servings, while some casseroles might have 2 or more servings of carbohydrate.  Foods that have less than 20 calories in each serving can be counted as "free" foods. Count 1 cup raw vegetables, or  cup cooked non-starchy vegetables as "free" foods. If you eat 3 or more servings at one meal, then count them as 1 carbohydrate serving.  Foods without Carbohydrates  Not all foods contain carbohydrates. Meat, some dairy, fats, non-starchy vegetables, and many beverages don't contain carbohydrate. So when you count carbohydrates, you can generally exclude chicken, pork, beef, fish, seafood, eggs, tofu, cheese, butter, sour cream, avocado, nuts, seeds, olives, mayonnaise, water, black coffee, unsweetened tea, and zero-calorie drinks. Vegetables with no or low carbohydrate include green beans, cauliflower, tomatoes, and onions. How much carbohydrate should I eat at each meal?  Carbohydrate counting can help you plan your  meals and manage your weight. Following are some starting points for carbohydrate intake at each meal. Work with your registered dietitian nutritionist to find the best range that works for your blood glucose and weight.   To Lose Weight To Maintain Weight  Women 2 - 3 carb servings 3 - 4 carb servings  Men 3 - 4 carb servings 4 - 5 carb servings  Checking your blood glucose after meals will help you know if you need to adjust the timing, type, or number of carbohydrate servings in your meal plan. Achieve and keep a healthy body weight by balancing your food intake and physical activity.  Tips How should I plan my meals?  Plan for half the food on your plate to include non-starchy vegetables, like salad greens, broccoli, or carrots. Try to eat 3 to 5 servings of non-starchy vegetables every day. Have a protein food at each meal. Protein foods include chicken, fish, meat, eggs, or beans (note that beans contain carbohydrate). These two food groups (non-starchy vegetables and proteins) are low in carbohydrate. If you fill up your plate with these foods, you will eat less carbohydrate but still fill up your stomach. Try to limit your carbohydrate portion to  of the plate.  What fats are healthiest to eat?  Diabetes increases risk for heart disease. To help protect your heart, eat more healthy fats, such as olive oil, nuts, and avocado. Eat less saturated fats like butter, cream, and high-fat meats, like bacon and sausage. Avoid trans fats, which are in all foods that list "partially hydrogenated oil" as an ingredient. What should I drink?  Choose drinks that are not sweetened with sugar. The healthiest choices are water, carbonated or seltzer waters, and tea and coffee without added sugars.  Sweet drinks will make your blood glucose go up very quickly. One serving of soda or energy drink is  cup. It is best to drink these beverages only if your blood glucose is low.  Artificially sweetened, or diet  drinks, typically do not increase your blood glucose if they have zero calories in them. Read labels of beverages, as some diet drinks do have carbohydrate and will raise your blood glucose. Label Reading Tips Read Nutrition Facts labels to find out how many grams of carbohydrate are in a food you want to eat. Don't forget: sometimes serving sizes on the label aren't the same as how much food you are going to eat, so you may need to calculate how much carbohydrate is in the food you are serving yourself.   Carbohydrate Counting for People with Diabetes Sample 1-Day Menu  Breakfast  cup yogurt, low fat, low sugar (1 carbohydrate serving)   cup cereal, ready-to-eat, unsweetened (1 carbohydrate  serving)  1 cup strawberries (1 carbohydrate serving)   cup almonds ( carbohydrate serving)  Lunch 1, 5 ounce can chunk light tuna  2 ounces cheese, low fat cheddar  6 whole wheat crackers (1 carbohydrate serving)  1 small apple (1 carbohydrate servings)   cup carrots ( carbohydrate serving)   cup snap peas  1 cup 1% milk (1 carbohydrate serving)   Evening Meal Stir fry made with: 3 ounces chicken  1 cup brown rice (3 carbohydrate servings)   cup broccoli ( carbohydrate serving)   cup green beans   cup onions  1 tablespoon olive oil  2 tablespoons teriyaki sauce ( carbohydrate serving)  Evening Snack 1 extra small banana (1 carbohydrate serving)  1 tablespoon peanut butter   Carbohydrate Counting for People with Diabetes Vegan Sample 1-Day Menu  Breakfast 1 cup cooked oatmeal (2 carbohydrate servings)   cup blueberries (1 carbohydrate serving)  2 tablespoons flaxseeds  1 cup soymilk fortified with calcium and vitamin D  1 cup coffee  Lunch 2 slices whole wheat bread (2 carbohydrate servings)   cup baked tofu   cup lettuce  2 slices tomato  2 slices avocado   cup baby carrots ( carbohydrate serving)  1 orange (1 carbohydrate serving)  1 cup soymilk fortified with calcium  and vitamin D   Evening Meal Burrito made with: 1 6-inch corn tortilla (1 carbohydrate serving)  1 cup refried vegetarian beans (2 carbohydrate servings)   cup chopped tomatoes   cup lettuce   cup salsa  1/3 cup brown rice (1 carbohydrate serving)  1 tablespoon olive oil for rice   cup zucchini   Evening Snack 6 small whole grain crackers (1 carbohydrate serving)  2 apricots ( carbohydrate serving)   cup unsalted peanuts ( carbohydrate serving)    Carbohydrate Counting for People with Diabetes Vegetarian (Lacto-Ovo) Sample 1-Day Menu  Breakfast 1 cup cooked oatmeal (2 carbohydrate servings)   cup blueberries (1 carbohydrate serving)  2 tablespoons flaxseeds  1 egg  1 cup 1% milk (1 carbohydrate serving)  1 cup coffee  Lunch 2 slices whole wheat bread (2 carbohydrate servings)  2 ounces low-fat cheese   cup lettuce  2 slices tomato  2 slices avocado   cup baby carrots ( carbohydrate serving)  1 orange (1 carbohydrate serving)  1 cup unsweetened tea  Evening Meal Burrito made with: 1 6-inch corn tortilla (1 carbohydrate serving)   cup refried vegetarian beans (1 carbohydrate serving)   cup tomatoes   cup lettuce   cup salsa  1/3 cup brown rice (1 carbohydrate serving)  1 tablespoon olive oil for rice   cup zucchini  1 cup 1% milk (1 carbohydrate serving)  Evening Snack 6 small whole grain crackers (1 carbohydrate serving)  2 apricots ( carbohydrate serving)   cup unsalted peanuts ( carbohydrate serving)    Copyright 2020  Academy of Nutrition and Dietetics. All rights reserved.  Using Nutrition Labels: Carbohydrate  Serving Size  Look at the serving size. All the information on the label is based on this portion. Servings Per Container  The number of servings contained in the package. Guidelines for Carbohydrate  Look at the total grams of carbohydrate in the serving size.  1 carbohydrate choice = 15 grams of carbohydrate. Range of Carbohydrate  Grams Per Choice  Carbohydrate Grams/Choice Carbohydrate Choices  6-10   11-20 1  21-25 1  26-35 2  36-40 2  41-50 3  51-55 3  56-65 4  66-70 4  71-80 5    Copyright 2020  Academy of Nutrition and Dietetics. All rights reserved.   Roslyn SmilingStephanie Craig, MS, RD, LDN Clinical Dietitian Office phone [phone]

## 2021-05-26 NOTE — Progress Notes (Signed)
Patient arrived on unit, oriented to unit. Reviewed medications, therapy schedule, rehab routine and plan of care. States an understanding of information reviewed. No complications noted at this time. Patient reports no pain and is AX4 Maxine Fredman L Ras Kollman  

## 2021-05-26 NOTE — Plan of Care (Signed)
  RD consulted for nutrition education regarding new onset diabetes.   Visited pt in room this AM to discuss DM diet education. Sister at bedside very engaged in education and conversation, taking notes. Pt quiet and had minimal questions, had previously reported to RN that she was feeling overwhelmed. Encouraged pt to seek outpatient DM education once she was home from rehab and was back in her normal home environment.   Lab Results  Component Value Date   HGBA1C 12.0 (H) 05/23/2021    RD provided "Carbohydrate Counting for People with Diabetes" handout from the Academy of Nutrition and Dietetics. Discussed different food groups and their effects on blood sugar, emphasizing carbohydrate-containing foods. Provided list of carbohydrates and recommended serving sizes of common foods.  Discussed importance of controlled and consistent carbohydrate intake throughout the day. Provided examples of ways to balance meals/snacks and encouraged intake of high-fiber, whole grain complex carbohydrates. Teach back method used.  Noted that DM educator also consulted to provided education prior to dc today.   Expect fair compliance.  Body mass index is 19.77 kg/m. Pt meets criteria for normal BMI, based on current BMI. <23kg/m undesirable for advanced age >46.  Current diet order is carb modified, patient is consuming approximately 83% of meals at this time. Labs and medications reviewed. No further nutrition interventions warranted at this time. RD contact information provided. If additional nutrition issues arise, please re-consult RD.  Greig Castilla, RD, LDN Clinical Dietitian Pager on Amion

## 2021-05-26 NOTE — Progress Notes (Signed)
Horton Chin, MD   Physician  Physical Medicine and Rehabilitation  PMR Pre-admission      Signed  Date of Service:  05/25/2021  3:32 PM       Related encounter: ED to Hosp-Admission (Discharged) from 05/23/2021 in [facility] (1C)       Signed          Show:Clear all [x] Written[x] Templated[x] Copied  Added by: [x] , RN[x] Raulkar, , MD   [] Hover for details                                                                                                                                                                                                                                                                                                                                                                                                                                         PMR Admission Coordinator Pre-Admission Assessment   Patient: Laura [name] is an 70 y.o., female MRN: DOB: [id number] Height: 5\' 8"  (172.7 cm) Weight: 59 kg   Insurance Information HMO:     PPO:      PCP:      IPA:      80/20:      OTHER: PRIMARY: Medicare a and b      Policy#: [phone]      Subscriber: pt Benefits:  Phone #: passport one source  Name: 6/21 Eff. Date: 01/05/2017     Deduct: $1556      Out of Pocket Max: none      Life Max: none CIR: 100%      SNF: 20 full days Outpatient: 80%     Co-Pay: 20% Home Health: 100%      Co-Pay: per Medicare neccesity DME: 80%     Co-Pay: 20% Providers: patient choice  SECONDARY: [place]      Policy#: [id number]       Financial Counselor:       Phone#:   The "Data Collection Information Summary" for patients in Inpatient Rehabilitation Facilities with attached "Privacy Act Statement-Health Care Records" was provided and verbally reviewed  with: Patient and Family   Emergency Contact Information Contact Information       Name Relation Home Work Mobile    Laura [name] Sister     [phone]           Current Medical History  Patient Admitting Diagnosis: CVA   History of Present Illness: 70 year old female without any medical follow up in years on no prescribed meds pta. Presented on 05/23/2021 via private vehicle after sister had concerns after receiving a card on 6/18 with showed abnormal writing. Sister arrived to patient's home on evening of 05/22/21 and reported facial drooping and difficulty speaking. Card likely had been mailed on 6/15. Patient advised to go to hospital but refused until 6/19.   Patient with a right facial droop and right upper extremity weakness.  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  MRA brain was negative.  MRA neck shows a 60% stenosis right internal carotid which would not cause this patient's symptoms.  Seen by neurology.  Neurology recommends aspirin indefinitely and Plavix for 21 days.  Continue Lipitor.  LDL 124.  Echocardiogram no signs of stroke and normal EF.  EKG normal sinus rhythm.   Type 2 diabetes mellitus with hyperlipidemia poorly controlled with a hemoglobin A1c of 12.0.  Started on low-dose Lantus insulin and short acting insulin prior to meals.  Initially will like to have on insulin but hopefully can convert over to pills once sugars are better controlled.  LDL 124.  Continue high-dose Lipitor.   Essential hypertension on Norvasc.Vitamin B12 deficiency.  Given dose of IM B12 and now on oral supplementation.  Recheck as outpatient.   Complete NIHSS TOTAL: 2   Patient's medical [facility] from [facility] has been reviewed by the rehabilitation admission coordinator and physician.   Past Medical History  History reviewed. No pertinent past medical history.   Family History   family history includes Diabetes in her  mother; Heart failure in her father; Hypertension in her mother and sister; Stroke in her father.   Prior Rehab/Hospitalizations Has the patient had prior rehab or hospitalizations prior to admission? Yes   Has the patient had major surgery during 100 days prior to admission? No               Current Medications   Current Facility-Administered Medications:    stroke: mapping our early stages of recovery book, , Does not apply, Once, Cox, Amy N, DO   acetaminophen (TYLENOL) tablet 650 mg, 650 mg, Oral, Q4H PRN **OR** acetaminophen (TYLENOL) 160 MG/5ML solution 650 mg, 650 mg, Per Tube, Q4H PRN **OR** acetaminophen (TYLENOL) suppository 650 mg, 650 mg, Rectal, Q4H PRN, Cox, Amy N, DO   amLODipine (NORVASC) tablet 5 mg, 5 mg, Oral,  Daily, Cox, Amy N, DO, 5 mg at 05/26/21 0816   aspirin EC tablet 81 mg, 81 mg, Oral, Daily, Bhagat, Srishti L, MD, 81 mg at 05/26/21 0817   atorvastatin (LIPITOR) tablet 80 mg, 80 mg, Oral, QHS, Jefferson Fuel, MD, 80 mg at 05/25/21 2050   clopidogrel (PLAVIX) tablet 75 mg, 75 mg, Oral, Daily, Wieting, Richard, MD   enoxaparin (LOVENOX) injection 40 mg, 40 mg, Subcutaneous, Q24H, Cox, Amy N, DO, 40 mg at 05/25/21 2051   insulin aspart (novoLOG) injection 0-5 Units, 0-5 Units, Subcutaneous, QHS, Charise Killian, MD, 3 Units at 05/25/21 2255   insulin aspart (novoLOG) injection 0-9 Units, 0-9 Units, Subcutaneous, TID WC, Charise Killian, MD, 3 Units at 05/26/21 1610   insulin aspart (novoLOG) injection 3 Units, 3 Units, Subcutaneous, TID WC, Charise Killian, MD, 3 Units at 05/26/21 0818   insulin glargine (LANTUS) injection 10 Units, 10 Units, Subcutaneous, Daily, Charise Killian, MD, 10 Units at 05/26/21 9604   metoprolol tartrate (LOPRESSOR) injection 5 mg, 5 mg, Intravenous, Q8H PRN, Charise Killian, MD   polyethylene glycol (MIRALAX / GLYCOLAX) packet 17 g, 17 g, Oral, Daily PRN, Wieting, Richard, MD   senna-docusate (Senokot-S) tablet 1 tablet,  1 tablet, Oral, QHS PRN, Cox, Amy N, DO   vitamin B-12 (CYANOCOBALAMIN) tablet 1,000 mcg, 1,000 mcg, Oral, Daily, Charise Killian, MD, 1,000 mcg at 05/26/21 5409   Patients Current Diet:  Diet Order                  Diet Heart Room service appropriate? Yes; Fluid consistency: Thin  Diet effective now                         Precautions / Restrictions Precautions Precautions: Fall Restrictions Weight Bearing Restrictions: No    Has the patient had 2 or more falls or a fall with injury in the past year? No   Prior Activity Level Community (5-7x/wk): Independent and living alone   Prior Functional Level Self Care: Did the patient need help bathing, dressing, using the toilet or eating? Independent   Indoor Mobility: Did the patient need assistance with walking from room to room (with or without device)? Independent   Stairs: Did the patient need assistance with internal or external stairs (with or without device)? Independent   Functional Cognition: Did the patient need help planning regular tasks such as shopping or remembering to take medications? Independent   Home Assistive Devices / Equipment Home Assistive Devices/Equipment: None Home Equipment: None   Prior Device Use: Indicate devices/aids used by the patient prior to current illness, exacerbation or injury? None of the above   Current Functional Level Cognition   Arousal/Alertness: Awake/alert Overall Cognitive Status: Impaired/Different from baseline Orientation Level: Oriented X4 Following Commands: Follows one step commands with increased time, Follows one step commands inconsistently Safety/Judgement: Decreased awareness of safety, Decreased awareness of deficits General Comments: word finding difficulty noted, requires visual/tactile cues for ADLs and safety Attention: Selective Selective Attention: Impaired Selective Attention Impairment: Verbal basic, Functional basic Memory: Appears intact (when  selecting appropriate orientation answers in field of 2 written choices) Awareness: Appears intact Safety/Judgment: Impaired    Extremity Assessment (includes Sensation/Coordination)   Upper Extremity Assessment: RUE deficits/detail RUE Deficits / Details: Impaired palm to finger tip translatory patterns, decreased pinch strength, functional grip defciits - pt noted to use gross grasp pattern for comb/toothbrush/pen RUE Sensation: WNL (SILT) RUE Coordination: decreased fine  motor, decreased gross motor LUE Deficits / Details: MMT 5/5: Shoulder abd, elbow flex & tricep, grip LUE Sensation: WNL (SILT) LUE Coordination: WNL, decreased fine motor (Finger to Nose: delayed, inaccurate; finger opposition: delayed)  Lower Extremity Assessment: Defer to PT evaluation RLE Deficits / Details: MMT 5/5 hip flexion, knee extension, knee flexion, dorsiflexion, plantarflexion RLE Sensation: WNL (SILT) RLE Coordination: WNL (heel to shin: negative, alternating toe taps: negative) LLE Deficits / Details: MMT 5/5 hip flexion, knee extension, knee flexion, dorsiflexion, plantarflexion LLE Sensation: WNL (SILT) LLE Coordination: WNL (heel to shin: negative, alternating toe taps: negative)     ADLs    completed pill box test with failing score. Pt requires cues to grasp medication bottles with R hand to assist with opening and drops bottles when not visually attending to R hand.     Mobility   Overal bed mobility: Needs Assistance Bed Mobility: Supine to Sit Supine to sit: Supervision General bed mobility comments: pt received and left in bed     Transfers   Overall transfer level: Needs assistance Equipment used: None Transfers: Sit to/from Stand Sit to Stand: Min guard General transfer comment: Utilizes 1 UE     Ambulation / Gait / Stairs / Psychologist, prison and probation services   Ambulation/Gait Ambulation/Gait assistance: Land (Feet): 30 Feet Assistive device: None Gait Pattern/deviations:  Decreased stance time - right General Gait Details: Decreased step length, cadence; takes wider turns     Posture / Balance Dynamic Sitting Balance Sitting balance - Comments: Good control with dynamic movements Static Standing Balance Single Leg Stance - Right Leg:  (Less than 2 sec w/ supervision) Single Leg Stance - Left Leg:  (10 seconds w/ supervision) Balance Overall balance assessment: Needs assistance Sitting-balance support: No upper extremity supported, Feet supported Sitting balance-Leahy Scale: Good Sitting balance - Comments: Good control with dynamic movements Standing balance support: No upper extremity supported, During functional activity Standing balance-Leahy Scale: Fair Standing balance comment: Able to catch, toss a ball w/ LOB x 1 requiring UE support Single Leg Stance - Right Leg:  (Less than 2 sec w/ supervision) Single Leg Stance - Left Leg:  (10 seconds w/ supervision)     Special needs/care consideration New DM@ dx with Hgb A1c of 12.0      Previous Home Environment  Living Arrangements: Alone  Lives With: Alone Available Help at Discharge: Family, Available 24 hours/day (sister [name] can arrange 24/7 supervision) Type of Home: House Home Layout: Two level, Able to live on main level with bedroom/bathroom (does not utilize 2nd floor) Home Access: Level entry Bathroom Shower/Tub: Psychologist, counselling, Door [employer] Toilet: Standard Bathroom Accessibility: Yes How Accessible: Accessible via walker Home Care Services: No Additional Comments: Per sisters in room, pt does not have working phone and no email address   Discharge Living Setting Plans for Discharge Living Setting: Patient's home (patiricia will provide 24/7 care in pt's home or patietn can stay with her) Type of Home at Discharge: House Discharge Home Layout: Two level, Able to live on main level with bedroom/bathroom Discharge Home Access: Level entry Discharge Bathroom Shower/Tub: Walk-in  shower, Tub/shower unit Discharge Bathroom Toilet: Standard Discharge Bathroom Accessibility: Yes How Accessible: Accessible via walker Does the patient have any problems obtaining your medications?: No   Social/Family/Support Systems Contact Information: sister, [name] Anticipated Caregiver: sister Patiricai and her other 2 sisters in [place] and [place] Anticipated Industrial/product designer Information: see above Ability/Limitations of Caregiver: none Caregiver Availability: 24/7 Discharge Plan Discussed with Primary Caregiver:  Yes Is Caregiver In Agreement with Plan?: Yes Does Caregiver/Family have Issues with Lodging/Transportation while Pt is in Rehab?: No   Goals Patient/Family Goal for Rehab: Mod I to supervision with PT, OT and SLP Expected length of stay: ELOS 7 to 10 days Pt/Family Agrees to Admission and willing to participate: Yes Program Orientation Provided & Reviewed with Pt/Caregiver Including Roles  & Responsibilities: Yes   Decrease burden of Care through IP rehab admission: n/a   Possible need for SNF placement upon discharge: not anticipated   Patient Condition: [facility], spoken with CM, and patient and family member. I discussed via phone for inpatient rehabilitation assessment.  Patient will benefit from ongoing PT, OT, and SLP, can actively participate in 3 hours of therapy a day 5 days of the week, and can make measurable gains during the admission.  Patient will also benefit from the coordinated team approach during an Inpatient Acute Rehabilitation admission.  The patient will receive intensive therapy as well as Rehabilitation physician, nursing, social worker, and care management interventions.  Due to bladder management, bowel management, safety, skin/wound care, disease management, medication administration, pain management, and patient education the patient requires 24 hour a day rehabilitation nursing.  The patient is currently  min guard assist overall with mobility and basic ADLs.  Discharge setting and therapy post discharge at home with outpatient is anticipated.  Patient has agreed to participate in the Acute Inpatient Rehabilitation Program and will admit today.   Preadmission Screen Completed By:  Clois DupesBoyette, Cloa Bushong Godwin, 05/26/2021 10:18 AM ______________________________________________________________________   Discussed status with Dr. Carlis Abbottaulkar on  05/26/2021 at 1019 and received approval for admission today.   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 Yes Co-Morbidities requiring supervision/potential complications: Type 2 DM, HTN, B12 deficiency, HLD, cognitive impairment Due to bladder management, bowel management, safety, skin/wound care, disease management, medication administration, pain management, and patient education, does the patient require 24 hr/day rehab nursing? Yes Does the patient require coordinated care of a physician, rehab nurse, PT, OT, and SLP to address physical and functional deficits in the context of the above medical diagnosis(es)? Yes Addressing deficits in the following areas: balance, endurance, locomotion, strength, transferring, bowel/bladder control, bathing, dressing, feeding, grooming, toileting, cognition, and psychosocial support Can the patient actively participate in an intensive therapy program of at least 3 hrs of therapy 5 days a week? Yes The potential for patient to make measurable gains while on inpatient rehab is excellent Anticipated functional outcomes upon discharge from inpatient rehab: supervision PT, supervision OT, supervision SLP Estimated  rehab length of stay to reach the above functional goals is: 5-7 days Anticipated discharge destination: Home 10. Overall Rehab/Functional Prognosis: excellent     MD Signature: Sula SodaKrutika Raulkar, MD          Revision History                             Note Details  Author Horton Chinaulkar, Krutika P, MD File Time 05/26/2021 11:12 AM  Author Type Physician Status Signed  Last Editor Horton Chinaulkar, Krutika P, MD Service Physical Medicine and Rehabilitation  Hospital Acct # [id number] Admit Date  05/26/2021   

## 2021-05-26 NOTE — Progress Notes (Signed)
Inpatient Rehabilitation Center Individual Statement of Services  Patient Name:  Laura [name]  Date:  05/26/2021  Welcome to the Inpatient Rehabilitation Center.  Our goal is to provide you with an individualized program based on your diagnosis and situation, designed to meet your specific needs.  With this comprehensive rehabilitation program, you will be expected to participate in at least 3 hours of rehabilitation therapies Monday-Friday, with modified therapy programming on the weekends.  Your rehabilitation program will include the following services:  Physical Therapy (PT), Occupational Therapy (OT), Speech Therapy (ST), 24 hour per day rehabilitation nursing, Care Coordinator, Rehabilitation Medicine, Nutrition Services, and Pharmacy Services  Weekly team conferences will be held on Tuesday to discuss your progress.  Your Inpatient Rehabilitation Care Coordinator will talk with you frequently to get your input and to update you on team discussions.  Team conferences with you and your family in attendance may also be held.  Expected length of stay: 5-7 days  Overall anticipated outcome: supervision level  Depending on your progress and recovery, your program may change. Your Inpatient Rehabilitation Care Coordinator will coordinate services and will keep you informed of any changes. Your Inpatient Rehabilitation Care Coordinator's name and contact numbers are listed  below.  The following services may also be recommended but are not provided by the Inpatient Rehabilitation Center:  Driving Evaluations Home Health Rehabiltiation Services Outpatient Rehabilitation Services    Arrangements will be made to provide these services after discharge if needed.  Arrangements include referral to agencies that provide these services.  Your insurance has been verified to be:  Medicare & CIgna Your primary doctor is:  None  Pertinent information will be shared with your doctor and your insurance  company.  Inpatient Rehabilitation Care Coordinator:  Dossie Der, [place] [phone] or Luna Glasgow  Information discussed with and copy given to patient by: Lucy Chris, 05/26/2021, 3:53 PM

## 2021-05-26 NOTE — TOC Progression Note (Signed)
Transition of Care [facility]) - Progression Note    Patient Details  Name: Laura [name] MRN: [id number] Date of Birth: Nov 26, 1951  Transition of Care [facility]) CM/SW Contact  Caryn Section, RN Phone Number: 05/26/2021, 10:05 AM  Clinical Narrative:   Patient will go to CIR today per [facility]  Diabetes Educator will see patient prior to discharge.  Patient has no PCP, however the rest of her family goes to [facility] and family will contact the clinic to set her up.  Patient and family agreeable to plan, no further questions.         Expected Discharge Plan and Services           Expected Discharge Date: 05/26/21                                     Social Determinants of Health (SDOH) Interventions    Readmission Risk Interventions No flowsheet data found.

## 2021-05-26 NOTE — H&P (Signed)
Physical Medicine and Rehabilitation Admission H&P    CC: Stroke with functional deficits.    HPI: Laura GurneyWanda [name] is a 70 year old female in relatively good health but no medical care who was admitted on 05/23/21 with garbled speech, right facial droop, vision changes and  RUE weakness. Per reports, she started feeling weak on 06/14 and needed persuasion to come to hospital for care. 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  CTA head neck done was negative for AVM, aneurysms or stenosis.  2D echo done showing EF 65 to 70% with borderline LVH.  Blood sugar was 372 at admission with hemoglobin A1c at 12.0.  She was also noted to have low vitamin B12 levels of 97 and started on supplementation.  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  She continues to be limited by weakness with dysarthria, tachycardia --HR up to 130 with activity, balance deficits, motor planning deficits with ADLs as well as poor awareness of deficits.  CIR recommended due to functional decline. Plans for d/c to sister's home after discharge. She currently has no complaints.    Review of Systems  Constitutional: Negative.   HENT: Negative.    Eyes: Negative.   Respiratory: Negative.    Cardiovascular: Negative.   Gastrointestinal: Negative.   Genitourinary: Negative.   Musculoskeletal: Negative.   Skin: Negative.   Neurological:  Positive for focal weakness.  Endo/Heme/Allergies: Negative.   Psychiatric/Behavioral: Negative.      No past medical history on file.   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 Per  reports that she has never smoked. She has never been exposed to tobacco smoke. She has never used smokeless tobacco. She reports that she does not drink alcohol and does not use drugs.   Allergies: No Known Allergies   Medications Prior to Admission  Medication Sig Dispense Refill   [START ON 05/27/2021] amLODipine (NORVASC) 5 MG tablet Take 1 tablet (5 mg total) by mouth daily. 30 tablet 0   [START ON 05/27/2021] aspirin EC 81 MG EC tablet Take 1 tablet (81 mg total) by mouth daily. Swallow whole. 30 tablet 11   atorvastatin (LIPITOR) 80 MG tablet Take 1 tablet (80 mg total) by mouth every evening. 30 tablet 0   [START ON 05/27/2021] clopidogrel (PLAVIX) 75 MG tablet Take 1 tablet (75 mg total) by mouth daily for 20 days. 20 tablet 0   insulin aspart (NOVOLOG) 100 UNIT/ML FlexPen Inject 3 Units into the skin 3 (three) times daily with meals. 15 mL 0   insulin glargine (LANTUS) 100 UNIT/ML Solostar Pen Inject 10 Units into the skin daily. 15 mL 0   polyethylene glycol (MIRALAX / GLYCOLAX) 17 g packet Take 17 g by mouth daily as needed for moderate constipation. 14 each 0   [START ON 05/27/2021] vitamin B-12 1000 MCG tablet Take 1 tablet (1,000 mcg total) by mouth daily. 30 tablet 0    Drug Regimen Review  Drug regimen was reviewed  and remains appropriate with no significant issues identified  Home:     Functional History:    Functional Status:  Mobility:          ADL:    Cognition:      Physical Exam: Blood pressure 138/62, pulse 91, temperature 98.2 F (36.8 C), resp. rate 17, SpO2 98 %. Physical Exam Gen: no distress, normal appearing HEENT: oral mucosa pink and moist, NCAT Cardio: Reg rate Chest: normal effort, normal rate of breathing Abd: soft, non-distended Ext: no edema Psych: pleasant, normal affect Skin: intact Neuro: Alert and oriented x3 (off 1 day with date). Makes good eye contact. +facial droop.  4/5 right sided grip strength. Strength is otherwise intact. Sensation intact. Mild dysarthria, word finding difficulty, slowed speech  Results for orders placed or performed during the hospital encounter of 05/23/21 (from the past 48 hour(s))  CBC     Status: None   Collection Time: 05/25/21  5:26 AM  Result Value Ref Range   WBC 6.8 4.0 - 10.5 K/uL   RBC 4.54 3.87 - 5.11 MIL/uL   Hemoglobin 13.2 12.0 - 15.0 g/dL   HCT 76.1 95.0 - 93.2 %   MCV 84.6 80.0 - 100.0 fL   MCH 29.1 26.0 - 34.0 pg   MCHC 34.4 30.0 - 36.0 g/dL   RDW 67.1 24.5 - 80.9 %   Platelets 258 150 - 400 K/uL   nRBC 0.0 0.0 - 0.2 %    Comment: Performed at [facility], [address], [place], [place] [address]  Basic metabolic panel     Status: Abnormal   Collection Time: 05/25/21  5:26 AM  Result Value Ref Range   Sodium 137 135 - 145 mmol/L   Potassium 4.3 3.5 - 5.1 mmol/L   Chloride 101 98 - 111 mmol/L   CO2 29 22 - 32 mmol/L   Glucose, Bld 241 (H) 70 - 99 mg/dL    Comment: Glucose reference range applies only to samples taken after fasting for at least 8 hours.   BUN 15 8 - 23 mg/dL   Creatinine, Ser 2.50 0.44 - 1.00 mg/dL   Calcium 9.0 8.9 - 53.9 mg/dL   GFR, Estimated >76 >73 mL/min    Comment: (NOTE) Calculated using the CKD-EPI Creatinine Equation (2021)    Anion gap 7 5 - 15    Comment: Performed at [facility], [address] [place], [place] [address]  Glucose, capillary     Status: Abnormal   Collection Time: 05/25/21  8:02 AM  Result Value Ref Range   Glucose-Capillary 226 (H) 70 - 99 mg/dL    Comment: Glucose reference range applies only to samples taken after fasting for at least 8 hours.  Glucose, capillary     Status: Abnormal   Collection Time: 05/25/21 12:02 PM  Result Value Ref Range   Glucose-Capillary 339 (H) 70 - 99 mg/dL    Comment: Glucose reference range applies only to samples taken after fasting for at least 8 hours.  Glucose, capillary     Status:  Abnormal   Collection Time: 05/25/21  4:26 PM  Result Value Ref Range   Glucose-Capillary 298 (H) 70 - 99 mg/dL    Comment: Glucose reference range applies only to samples taken after fasting for at least 8 hours.  Glucose, capillary     Status: Abnormal   Collection Time: 05/25/21 10:18 PM  Result Value Ref Range   Glucose-Capillary 291 (H) 70 - 99 mg/dL  Comment: Glucose reference range applies only to samples taken after fasting for at least 8 hours.  CBC     Status: None   Collection Time: 05/26/21  4:53 AM  Result Value Ref Range   WBC 7.1 4.0 - 10.5 K/uL   RBC 4.84 3.87 - 5.11 MIL/uL   Hemoglobin 13.8 12.0 - 15.0 g/dL   HCT 38.7 56.4 - 33.2 %   MCV 84.1 80.0 - 100.0 fL   MCH 28.5 26.0 - 34.0 pg   MCHC 33.9 30.0 - 36.0 g/dL   RDW 95.1 88.4 - 16.6 %   Platelets 292 150 - 400 K/uL   nRBC 0.0 0.0 - 0.2 %    Comment: Performed at [facility], [address] [place], [place] [address]  Basic metabolic panel     Status: Abnormal   Collection Time: 05/26/21  4:53 AM  Result Value Ref Range   Sodium 137 135 - 145 mmol/L   Potassium 4.6 3.5 - 5.1 mmol/L   Chloride 102 98 - 111 mmol/L   CO2 30 22 - 32 mmol/L   Glucose, Bld 233 (H) 70 - 99 mg/dL    Comment: Glucose reference range applies only to samples taken after fasting for at least 8 hours.   BUN 20 8 - 23 mg/dL   Creatinine, Ser 6.01 0.44 - 1.00 mg/dL   Calcium 9.1 8.9 - 09.3 mg/dL   GFR, Estimated >23 >55 mL/min    Comment: (NOTE) Calculated using the CKD-EPI Creatinine Equation (2021)    Anion gap 5 5 - 15    Comment: Performed at [facility], [address] [place], [place] [address]  Glucose, capillary     Status: Abnormal   Collection Time: 05/26/21  7:46 AM  Result Value Ref Range   Glucose-Capillary 210 (H) 70 - 99 mg/dL    Comment: Glucose reference range applies only to samples taken after fasting for at least 8 hours.  Glucose, capillary     Status: Abnormal   Collection Time:  05/26/21 11:38 AM  Result Value Ref Range   Glucose-Capillary 280 (H) 70 - 99 mg/dL    Comment: Glucose reference range applies only to samples taken after fasting for at least 8 hours.   No results found.     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  Admit to CIR 2.  Impaired mobility, ambulating 30 feet -DVT/anticoagulation:  Pharmaceutical: Lovenox  -antiplatelet therapy: DAPT X 3 weeks followed by ASA alone.  3. Pain Management: N/A 4. Mood: LCSW to follow for evaluation and support.   -antipsychotic agents: N/A 5. Neuropsych: This patient is not capable of making decisions on her own behalf. 6. Skin/Wound Care:  Routine pressure relief measures.  7. Fluids/Electrolytes/Nutrition: Monitor I/O. Check lytes in am. Check magnesium, Vitamin D, iron levels tomorrow 8. T2DM: Hgb A1C- 12.0. Will monitor BS ac/hs and titrate medications as indicated.  --on Lantus 10 and 3 units novolog for meal coverage.   --continue to reinforce DM education.  9. Dyslipidemia: Continue Lipitor.  10. B 12 deficiency: Level 97. Received 1000  mcg/IM on 06/20 and repeat weekly X 3 for supplementation. --continue oral supplement.  11. HTN: Monitor BP tid--continue 5mg  Norvasc daily, well controlled.   I have personally performed a face to face diagnostic evaluation, including, but not limited to relevant  history and physical exam findings, of this patient and developed relevant assessment and plan.  Additionally, I have reviewed and concur with the physician assistant's documentation above.  Sula Soda, MD  Jacquelynn Cree, PA-C

## 2021-05-26 NOTE — Progress Notes (Signed)
Nutrition Brief Note  RD consulted for diet education regarding type 2 diabetes mellitus. Pt and family unavailable during time of contact. Handout "Counting Carbohydrates for People with Diabetes" from the Academy of Nutrition and Dietetics Manual placed in discharge instructions. RD to follow up with diet education as able.   Roslyn Smiling, MS, RD, LDN RD pager number/after hours weekend pager number on Amion.

## 2021-05-26 NOTE — H&P (Signed)
Physical Medicine and Rehabilitation Admission H&P    CC: Stroke with functional deficits.    HPI: Laura [name] is a 70 year old RH-female in relatively good health but no medical care who was admitted on 05/23/21 with garbled speech, right facial droop, vision changes and  RUE weakness. Per reports, she started feeling weak on 06/14 and needed persuasion to come to hospital for care. 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  CTA head neck done was negative for AVM, aneurysms or stenosis.  2D echo done showing EF 65 to 70% with borderline LVH.  Blood sugar was 372 at admission with hemoglobin A1c at 12.0.  She was also noted to have low vitamin B12 levels of 97 and started on supplementation.  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  She continues to be limited by weakness with dysarthria, tachycardia --HR up to 130 with activity, balance deficits, motor planning deficits with ADLs as well as poor awareness of deficits.  CIR recommended due to functional decline. Plans for d/c to sister's home after discharge.    Review of Systems  Constitutional:  Negative for chills and fever.  HENT:  Negative for hearing loss and tinnitus.   Eyes:  Positive for double vision (since stroke). Negative for blurred vision.  Respiratory:  Negative for cough and shortness of breath.   Cardiovascular:  Negative for chest pain and palpitations.  Gastrointestinal:  Negative for abdominal pain, heartburn and nausea.  Genitourinary:  Negative for dysuria and urgency.  Skin:  Negative for rash.  Neurological:  Positive for speech change and weakness.  Psychiatric/Behavioral:  The patient does not have insomnia.     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 She reports that she has never smoked. She has never been exposed to tobacco smoke. She has never used smokeless tobacco. She reports that she does not drink alcohol and does not use drugs.   Allergies: No Known Allergies   Medications Prior to Admission  Medication Sig Dispense Refill   [START ON 05/27/2021] amLODipine (NORVASC) 5 MG tablet Take 1 tablet (5 mg total) by mouth daily. 30 tablet 0   [START ON 05/27/2021] aspirin EC 81 MG EC tablet Take 1 tablet (81 mg total) by mouth daily. Swallow whole. 30 tablet 11   atorvastatin (LIPITOR) 80 MG tablet Take 1 tablet (80 mg total) by mouth every evening. 30 tablet 0   [START ON 05/27/2021] clopidogrel (PLAVIX) 75 MG tablet Take 1 tablet (75 mg total) by mouth daily for 20 days. 20 tablet 0   insulin aspart (NOVOLOG) 100 UNIT/ML FlexPen Inject 3 Units into the skin 3 (three) times daily with meals. 15 mL 0   insulin glargine (LANTUS) 100 UNIT/ML Solostar Pen Inject 10 Units into the skin daily. 15 mL 0   polyethylene glycol (  MIRALAX / GLYCOLAX) 17 g packet Take 17 g by mouth daily as needed for moderate constipation. 14 each 0   [START ON 05/27/2021] vitamin B-12 1000 MCG tablet Take 1 tablet (1,000 mcg total) by mouth daily. 30 tablet 0    Drug Regimen Review  Drug regimen was reviewed and remains appropriate with no significant issues identified  Home: Home Living Family/patient expects to be discharged to:: Private residence Living Arrangements: Alone Available Help at Discharge: Family, Available 24 hours/day (sister [name] can arrange 24/7 supervision) Type of Home: House Home Access: Level entry Home  Layout: Two level, Able to live on main level with bedroom/bathroom (does not utilize 2nd floor) Bathroom Shower/Tub: Psychologist, counsellingWalk-in shower, Sport and exercise psychologistDoor Bathroom Toilet: Standard Bathroom Accessibility: Yes Home Equipment: None Additional Comments: Per sisters in room, pt does not have working phone and no email address  Lives With: Alone   Functional History: Prior Function Level of Independence: Independent Comments: Previoulsy cared for her parents who have since passed away  Functional Status:  Mobility: Bed Mobility Overal bed mobility: Needs Assistance Bed Mobility: Supine to Sit Supine to sit: Supervision General bed mobility comments: pt received and left in bed Transfers Overall transfer level: Needs assistance Equipment used: None Transfers: Sit to/from Stand Sit to Stand: Min guard General transfer comment: Utilizes 1 UE Ambulation/Gait Ambulation/Gait assistance: Min guard Gait Distance (Feet): 30 Feet Assistive device: None Gait Pattern/deviations: Decreased stance time - right General Gait Details: Decreased step length, cadence; takes wider turns     Cognition: Cognition Overall Cognitive Status: Impaired/Different from baseline Arousal/Alertness: Awake/alert Orientation Level: Oriented X4 Attention: Selective Selective Attention: Impaired Selective Attention Impairment: Verbal basic, Functional basic Memory: Appears intact (when selecting appropriate orientation answers in field of 2 written choices) Awareness: Appears intact Safety/Judgment: Impaired Cognition Arousal/Alertness: Awake/alert Behavior During Therapy: WFL for tasks assessed/performed Overall Cognitive Status: Impaired/Different from baseline Area of Impairment: Following commands, Problem solving, Safety/judgement Following Commands: Follows one step commands with increased time, Follows one step commands inconsistently Safety/Judgement: Decreased awareness of safety, Decreased awareness of  deficits Problem Solving: Requires verbal cues, Requires tactile cues General Comments: word finding difficulty noted, requires visual/tactile cues for ADLs and safety  Physical Exam: Blood pressure 134/76, pulse 80, temperature 98 F (36.7 C), resp. rate 16, height 5\' 8"  (1.727 m), weight 59 kg, SpO2 97 %. Physical Exam Constitutional:      Appearance: Normal appearance.  Pulmonary:     Effort: Pulmonary effort is normal.  Musculoskeletal:        General: No swelling or tenderness.  Neurological:     Mental Status: She is alert and oriented to person, place, and time.     Comments: Right facial paresis with mild dysarthria. Able to follow simple motor commands without difficulty. RUE weakness.     Results for orders placed or performed during the hospital encounter of 05/23/21 (from the past 48 hour(s))  CBC     Status: None   Collection Time: 05/25/21  5:26 AM  Result Value Ref Range   WBC 6.8 4.0 - 10.5 K/uL   RBC 4.54 3.87 - 5.11 MIL/uL   Hemoglobin 13.2 12.0 - 15.0 g/dL   HCT 16.138.4 09.636.0 - 04.546.0 %   MCV 84.6 80.0 - 100.0 fL   MCH 29.1 26.0 - 34.0 pg   MCHC 34.4 30.0 - 36.0 g/dL   RDW 40.912.4 81.111.5 - 91.415.5 %   Platelets 258 150 - 400 K/uL   nRBC 0.0 0.0 - 0.2 %  Comment: Performed at [facility], [address] [place], [place] [address]  Basic metabolic panel     Status: Abnormal   Collection Time: 05/25/21  5:26 AM  Result Value Ref Range   Sodium 137 135 - 145 mmol/L   Potassium 4.3 3.5 - 5.1 mmol/L   Chloride 101 98 - 111 mmol/L   CO2 29 22 - 32 mmol/L   Glucose, Bld 241 (H) 70 - 99 mg/dL    Comment: Glucose reference range applies only to samples taken after fasting for at least 8 hours.   BUN 15 8 - 23 mg/dL   Creatinine, Ser 0.25 0.44 - 1.00 mg/dL   Calcium 9.0 8.9 - 85.2 mg/dL   GFR, Estimated >77 >82 mL/min    Comment: (NOTE) Calculated using the CKD-EPI Creatinine Equation (2021)    Anion gap 7 5 - 15    Comment: Performed at [facility], [address] [place], [place] [address]  Glucose, capillary     Status: Abnormal   Collection Time: 05/25/21  8:02 AM  Result Value Ref Range   Glucose-Capillary 226 (H) 70 - 99 mg/dL    Comment: Glucose reference range applies only to samples taken after fasting for at least 8 hours.  Glucose, capillary     Status: Abnormal   Collection Time: 05/25/21 12:02 PM  Result Value Ref Range   Glucose-Capillary 339 (H) 70 - 99 mg/dL    Comment: Glucose reference range applies only to samples taken after fasting for at least 8 hours.  Glucose, capillary     Status: Abnormal   Collection Time: 05/25/21  4:26 PM  Result Value Ref Range   Glucose-Capillary 298 (H) 70 - 99 mg/dL    Comment: Glucose reference range applies only to samples taken after fasting for at least 8 hours.  Glucose, capillary     Status: Abnormal   Collection Time: 05/25/21 10:18 PM  Result Value Ref Range   Glucose-Capillary 291 (H) 70 - 99 mg/dL    Comment: Glucose reference range applies only to samples taken after fasting for at least 8 hours.  CBC     Status: None   Collection Time: 05/26/21  4:53 AM  Result Value Ref Range   WBC 7.1 4.0 - 10.5 K/uL   RBC 4.84 3.87 - 5.11 MIL/uL   Hemoglobin 13.8 12.0 - 15.0 g/dL   HCT 61.4 43.1 - 54.0 %   MCV 84.1 80.0 - 100.0 fL   MCH 28.5 26.0 - 34.0 pg   MCHC 33.9 30.0 - 36.0 g/dL   RDW 08.6 76.1 - 95.0 %   Platelets 292 150 - 400 K/uL   nRBC 0.0 0.0 - 0.2 %    Comment: Performed at [facility], [address], [place], [place] [address]  Basic metabolic panel     Status: Abnormal   Collection Time: 05/26/21  4:53 AM  Result Value Ref Range   Sodium 137 135 - 145 mmol/L   Potassium 4.6 3.5 - 5.1 mmol/L   Chloride 102 98 - 111 mmol/L   CO2 30 22 - 32 mmol/L   Glucose, Bld 233 (H) 70 - 99 mg/dL    Comment: Glucose reference range applies only to samples taken after fasting for at least 8 hours.   BUN 20 8 - 23 mg/dL   Creatinine, Ser 1.24 0.44 - 1.00  mg/dL   Calcium 9.1 8.9 - 58.0 mg/dL   GFR, Estimated >99 >83 mL/min  Comment: (NOTE) Calculated using the CKD-EPI Creatinine Equation (2021)    Anion gap 5 5 - 15    Comment: Performed at [facility], [address] [place], [place] [address]  Glucose, capillary     Status: Abnormal   Collection Time: 05/26/21  7:46 AM  Result Value Ref Range   Glucose-Capillary 210 (H) 70 - 99 mg/dL    Comment: Glucose reference range applies only to samples taken after fasting for at least 8 hours.  Glucose, capillary     Status: Abnormal   Collection Time: 05/26/21 11:38 AM  Result Value Ref Range   Glucose-Capillary 280 (H) 70 - 99 mg/dL    Comment: Glucose reference range applies only to samples taken after fasting for at least 8 hours.   No results found.     Medical Problem List and Plan:  2.  Antithrombotics: -DVT/anticoagulation:  Pharmaceutical: Lovenox  -antiplatelet therapy: DAPT X 3 weeks followed by ASA alone.  3. Pain Management: N/A 4. Mood: LCSW to follow for evaluation and support.   -antipsychotic agents: N/A 5. Neuropsych: This patient is capable of making decisions on her own behalf. 6. Skin/Wound Care:  Routine pressure relief measures.  7. Fluids/Electrolytes/Nutrition: Monitor I/O. Check lytes in am.  --Check magnesium, Vitamin D, iron levels tomorrow 8. T2DM: Hgb A1C- 12.0. Will monitor BS ac/hs and titrate medications as indicated.  --on Lantus 10 and 3 units novolog for meal coverage.   --continue to reinforce DM education.  9. Dyslipidemia: Continue Lipitor.  10. B 12 deficiency: Level 97. Received 1000  mcg/IM on 06/20 and repeat weekly X 3 for supplementation. --continue oral supplement.  11. HTN: Monitor BP tid--continue 5mg  Norvasc daily, well controlled.    I have personally performed a face to face diagnostic evaluation, including, but not limited to relevant history and physical exam findings, of this patient and developed relevant  assessment and plan.  Additionally, I have reviewed and concur with the physician assistant's documentation above.  , MD  Sula Soda, PA-C 05/26/2021

## 2021-05-26 NOTE — Discharge Summary (Signed)
[facility] [place] at [facility]   PATIENT NAME: Laura [name]    MR#:  [id number]  DATE OF BIRTH:  [birth year]  DATE OF ADMISSION:  05/23/2021 ADMITTING PHYSICIAN: Charise Killian, MD  DATE OF DISCHARGE: 05/26/2021  PRIMARY CARE PHYSICIAN: Pcp, No    ADMISSION DIAGNOSIS:  Stroke [facility]) [I63.9] Cerebrovascular accident (CVA) due to other mechanism (HCC) [I63.89] CVA (cerebral vascular accident) (HCC) [I63.9]  DISCHARGE DIAGNOSIS:  Active Problems:   Stroke [facility])   Essential hypertension   CVA (cerebral vascular accident) (HCC)   SECONDARY DIAGNOSIS:  History reviewed. No pertinent past medical history.  HOSPITAL COURSE:   Acute CVA.  Patient with a right facial droop and right upper extremity weakness.  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  MRA brain was negative.  MRA neck shows a 60% stenosis right internal carotid which would not cause this patient's symptoms.  Seen by neurology.  Neurology recommends aspirin indefinitely and Plavix for 21 days.  Continue Lipitor.  LDL 124.  Echocardiogram no signs of stroke and normal EF.  EKG normal sinus rhythm.  Patient was seen by physical therapy and patient will be transferred to CIR today. Type 2 diabetes mellitus with hyperlipidemia poorly controlled with a hemoglobin A1c of 12.0.  Started on low-dose Lantus insulin and short acting insulin prior to meals.  Initially will like to have on insulin but hopefully can convert over to pills once sugars are better controlled.  LDL 124.  Continue high-dose Lipitor. Essential hypertension on Norvasc Vitamin B12 deficiency.  Given dose of IM B12 and now on oral supplementation.  Recheck as outpatient.  DISCHARGE CONDITIONS:   Satisfactory  CONSULTS OBTAINED:    Neurology  DRUG ALLERGIES:  No Known Allergies  DISCHARGE MEDICATIONS:   Allergies as of 05/26/2021   No Known Allergies       Medication List     TAKE these medications    amLODipine 5 MG tablet Commonly known as: NORVASC Take 1 tablet (5 mg total) by mouth daily. Start taking on: May 27, 2021   aspirin 81 MG EC tablet Take 1 tablet (81 mg total) by mouth daily. Swallow whole. Start taking on: May 27, 2021   atorvastatin 80 MG tablet Commonly known as: Lipitor Take 1 tablet (80 mg total) by mouth every evening.   clopidogrel 75 MG tablet Commonly known as: PLAVIX Take 1 tablet (75 mg total) by mouth daily for 20 days. Start taking on: May 27, 2021   cyanocobalamin 1000 MCG tablet Take 1 tablet (1,000 mcg total) by mouth daily. Start taking on: May 27, 2021   insulin aspart 100 UNIT/ML FlexPen Commonly known as: NOVOLOG Inject 3 Units into the skin 3 (three) times daily with meals.   insulin glargine 100 UNIT/ML Solostar Pen Commonly known as: LANTUS Inject 10 Units into the skin daily.   polyethylene glycol 17 g packet Commonly known as: MIRALAX / GLYCOLAX Take 17 g by mouth daily as needed for moderate constipation.         DISCHARGE INSTRUCTIONS:   Follow-up at acute rehab Follow-up neurology as outpatient Given the name of Dr. Nemiah Commander to follow-up with as outpatient once out of acute rehab.  If you experience worsening of your admission symptoms, develop shortness of breath, life threatening emergency, suicidal or homicidal thoughts you must seek medical attention immediately by calling 911 or calling your MD immediately  if symptoms less severe.  You Must  read complete instructions/literature along with all the possible adverse reactions/side effects for all the Medicines you take and that have been prescribed to you. Take any new Medicines after you have completely understood and accept all the possible adverse reactions/side effects.   Please note  You were cared for by a hospitalist during your hospital stay. If you have any questions about your discharge medications or  the care you received while you were in the hospital after you are discharged, you can call the unit and asked to speak with the hospitalist on call if the hospitalist that took care of you is not available. Once you are discharged, your primary care physician will handle any further medical issues. Please note that NO REFILLS for any discharge medications will be authorized once you are discharged, as it is imperative that you return to your primary care physician (or establish a relationship with a primary care physician if you do not have one) for your aftercare needs so that they can reassess your need for medications and monitor your lab values.    Today   CHIEF COMPLAINT:  No chief complaint on file.   HISTORY OF PRESENT ILLNESS:  Laura [name]  is a 70 y.o. female came in with right-sided facial droop and right upper extremity weakness   VITAL SIGNS:  Blood pressure 134/76, pulse 80, temperature 98 F (36.7 C), resp. rate 16, height  (1.727 m), weight 59 kg, SpO2 97 %.  I/O:   Intake/Output Summary (Last 24 hours) at 05/26/2021 0929 Last data filed at 05/25/2021 1830 Gross per 24 hour  Intake 600 ml  Output --  Net 600 ml    PHYSICAL EXAMINATION:  GENERAL:  70 y.o.-year-old patient lying in the bed with no acute distress.  EYES: Pupils equal, round, reactive to light and accommodation. No scleral icterus. Extraocular muscles intact.  HEENT: Head atraumatic, normocephalic. Oropharynx and nasopharynx clear.   LUNGS: Normal breath sounds bilaterally, no wheezing, rales,rhonchi or crepitation. No use of accessory muscles of respiration.  CARDIOVASCULAR: S1, S2 normal. No murmurs, rubs, or gallops.  ABDOMEN: Soft, non-tender, non-distended.  EXTREMITIES: No pedal edema, cyanosis, or clubbing.  NEUROLOGIC: Right facial droop.  Able to straight leg raise without a problem.  Slight weakness 4+ out of 5 right arm. PSYCHIATRIC: The patient is alert and oriented x 3.  SKIN: No  obvious rash, lesion, or ulcer.   DATA REVIEW:   CBC Recent Labs  Lab 05/26/21 0453  WBC 7.1  HGB 13.8  HCT 40.7  PLT 292    Chemistries  Recent Labs  Lab 05/23/21 1219 05/24/21 0634 05/26/21 0453  NA 136   < > 137  K 4.3   < > 4.6  CL 102   < > 102  CO2 25   < > 30  GLUCOSE 372*   < > 233*  BUN 16   < > 20  CREATININE 1.01*   < > 0.97  CALCIUM 9.6   < > 9.1  AST 26  --   --   ALT 24  --   --   ALKPHOS 63  --   --   BILITOT 0.6  --   --    < > = values in this interval not displayed.    Microbiology Results  Results for orders placed or performed during the hospital encounter of 05/23/21  SARS CORONAVIRUS 2 (TAT 6-24 HRS) Nasopharyngeal Nasopharyngeal Swab     Status: None   Collection Time:  05/23/21  2:20 PM   Specimen: Nasopharyngeal Swab  Result Value Ref Range Status   SARS Coronavirus 2 NEGATIVE NEGATIVE Final    Comment: (NOTE) SARS-CoV-2 target nucleic acids are NOT DETECTED.  The SARS-CoV-2 RNA is generally detectable in upper and lower respiratory specimens during the acute phase of infection. Negative results do not preclude SARS-CoV-2 infection, do not rule out co-infections with other pathogens, and should not be used as the sole basis for treatment or other patient management decisions. Negative results must be combined with clinical observations, patient history, and epidemiological information. The expected result is Negative.  Fact Sheet for Patients: HairSlick.no  Fact Sheet for Healthcare Providers: quierodirigir.com  This test is not yet approved or cleared by the [place] FDA and  has been authorized for detection and/or diagnosis of SARS-CoV-2 by FDA under an Emergency Use Authorization (EUA). This EUA will remain  in effect (meaning this test can be used) for the duration of the COVID-19 declaration under Se ction 564(b)(1) of the Act, 21 U.S.C. section 360bbb-3(b)(1),  unless the authorization is terminated or revoked sooner.  Performed at [facility], [address] [place], [place] [address]     RADIOLOGY:  ECHOCARDIOGRAM COMPLETE  Result Date: 05/24/2021    ECHOCARDIOGRAM REPORT   Patient Name:   Laura [name] Date of Exam: 05/24/2021 Medical Rec #:  [id number]    Height:       68.0 in Accession #:    [id number]   Weight:       130.0 lb Date of Birth:  27-Feb-1951    BSA:          1.702 m Patient Age:    70 years     BP:           160/76 mmHg Patient Gender: F            HR:           92 bpm. Exam Location:  [facility] Procedure: 2D Echo, Color Doppler and Cardiac Doppler Indications:     I63.9 Stroke  History:         Patient has no prior history of Echocardiogram examinations.                  Signs/Symptoms:Rt side facial droop.  Sonographer:     Humphrey Rolls RDCS (AE) Referring Phys:  [id number] AMY N COX Diagnosing Phys: Yvonne Kendall MD  Sonographer Comments: Suboptimal subcostal window. IMPRESSIONS  1. Left ventricular ejection fraction, by estimation, is 65 to 70%. The left ventricle has normal function. Left ventricular endocardial border not optimally defined to evaluate regional wall motion. Left ventricular diastolic parameters are consistent with Grade I diastolic dysfunction (impaired relaxation).  2. Right ventricular systolic function is normal. The right ventricular size is normal. Tricuspid regurgitation signal is inadequate for assessing PA pressure.  3. The mitral valve is grossly normal. Trivial mitral valve regurgitation. No evidence of mitral stenosis.  4. The aortic valve was not well visualized. Aortic valve regurgitation is mild to moderate.  5. The inferior vena cava is normal in size with greater than 50% respiratory variability, suggesting right atrial pressure of 3 mmHg. FINDINGS  Left Ventricle: Left ventricular ejection fraction, by estimation, is 65 to 70%. The left ventricle has normal function. Left ventricular endocardial border not  optimally defined to evaluate regional wall motion. The left ventricular internal cavity size was normal in size. There is borderline left ventricular hypertrophy. Left ventricular diastolic parameters are  consistent with Grade I diastolic dysfunction (impaired relaxation). Right Ventricle: The right ventricular size is normal. No increase in right ventricular wall thickness. Right ventricular systolic function is normal. Tricuspid regurgitation signal is inadequate for assessing PA pressure. Left Atrium: Left atrial size was normal in size. Right Atrium: Right atrial size was normal in size. Pericardium: There is no evidence of pericardial effusion. Mitral Valve: The mitral valve is grossly normal. Trivial mitral valve regurgitation. No evidence of mitral valve stenosis. MV peak gradient, 2.8 mmHg. The mean mitral valve gradient is 2.0 mmHg. Tricuspid Valve: The tricuspid valve is not well visualized. Tricuspid valve regurgitation is trivial. Aortic Valve: The aortic valve was not well visualized. Aortic valve regurgitation is mild to moderate. Aortic valve mean gradient measures 3.0 mmHg. Aortic valve peak gradient measures 5.9 mmHg. Aortic valve area, by VTI measures 4.13 cm. Pulmonic Valve: The pulmonic valve was not well visualized. Pulmonic valve regurgitation is not visualized. No evidence of pulmonic stenosis. Aorta: The aortic root is normal in size and structure. Pulmonary Artery: The pulmonary artery is not well seen. Venous: The inferior vena cava is normal in size with greater than 50% respiratory variability, suggesting right atrial pressure of 3 mmHg. IAS/Shunts: The interatrial septum was not well visualized.  LEFT VENTRICLE PLAX 2D LVIDd:         3.10 cm     Diastology LVIDs:         2.30 cm     LV e' medial:    5.77 cm/s LV PW:         1.00 cm     LV E/e' medial:  8.2 LV IVS:        1.00 cm     LV e' lateral:   7.72 cm/s LVOT diam:     2.20 cm     LV E/e' lateral: 6.1 LV SV:         90 LV SV  Index:   53 LVOT Area:     3.80 cm  LV Volumes (MOD) LV vol d, MOD A2C: 64.1 ml LV vol d, MOD A4C: 70.9 ml LV vol s, MOD A2C: 42.5 ml LV vol s, MOD A4C: 48.2 ml LV SV MOD A2C:     21.6 ml LV SV MOD A4C:     70.9 ml LV SV MOD BP:      24.6 ml RIGHT VENTRICLE RV Basal diam:  2.40 cm LEFT ATRIUM             Index       RIGHT ATRIUM          Index LA diam:        3.20 cm 1.88 cm/m  RA Area:     9.04 cm 5.31 cm/m LA Vol (A2C):   24.7 ml 14.51 ml/m RA Volume:   11.10 ml 6.52 ml/m LA Vol (A4C):   28.4 ml 16.69 ml/m LA Biplane Vol: 27.0 ml 15.87 ml/m  AORTIC VALVE                   PULMONIC VALVE AV Area (Vmax):    3.83 cm    PV Vmax:       1.11 m/s AV Area (Vmean):   3.76 cm    PV Vmean:      77.800 cm/s AV Area (VTI):     4.13 cm    PV VTI:        0.184 m AV Vmax:  121.00 cm/s PV Peak grad:  4.9 mmHg AV Vmean:          87.400 cm/s PV Mean grad:  3.0 mmHg AV VTI:            0.218 m AV Peak Grad:      5.9 mmHg AV Mean Grad:      3.0 mmHg LVOT Vmax:         122.00 cm/s LVOT Vmean:        86.400 cm/s LVOT VTI:          0.237 m LVOT/AV VTI ratio: 1.09  AORTA Ao Root diam: 3.00 cm MITRAL VALVE MV Area (PHT): 6.43 cm    SHUNTS MV Area VTI:   4.92 cm    Systemic VTI:  0.24 m MV Peak grad:  2.8 mmHg    Systemic Diam: 2.20 cm MV Mean grad:  2.0 mmHg MV Vmax:       0.84 m/s MV Vmean:      61.9 cm/s MV Decel Time: 118 msec MV E velocity: 47.40 cm/s MV A velocity: 81.20 cm/s MV E/A ratio:  0.58 Cristal Deerhristopher End MD Electronically signed by Yvonne Kendallhristopher End MD Signature Date/Time: 05/24/2021/1:09:40 PM    Final      Management plans discussed with the patient, family and they are in agreement.  CODE STATUS:     Code Status Orders  (From admission, onward)           Start     Ordered   05/23/21 1419  Full code  Continuous        05/23/21 1419           Code Status History     This patient has a current code status but no historical code status.       TOTAL TIME TAKING CARE OF THIS  PATIENT: 35 minutes.    Alford Highlandichard Chloeann Alfred M.D on 05/26/2021 at 9:29 AM  Between 7am to 6pm - Pager - [phone]  After 6pm go to [email or web address] - password [email or web address]  [facility]  CC: Primary care physician; Pcp, No

## 2021-05-26 NOTE — Progress Notes (Signed)
Patient arrived on unit, oriented to unit. Reviewed medications, therapy schedule, rehab routine and plan of care. States an understanding of information reviewed. No complications noted at this time. Patient reports no pain and is AX4 Laura Braddy C Wilmer Jr.   

## 2021-05-26 NOTE — Progress Notes (Signed)
Inpatient Rehabilitation Medication Review by a Pharmacist  A complete drug regimen review was completed for this patient to identify any potential clinically significant medication issues.  Clinically significant medication issues were identified:  no  Check AMION for pharmacist assigned to patient if future medication questions/issues arise during this admission.  Pharmacist comments: None  Time spent performing this drug regimen review (minutes):  10 minutes   Laura [name] 05/26/2021 1:50 PM

## 2021-05-26 NOTE — Progress Notes (Addendum)
Inpatient Rehabilitation Admissions Coordinator   I spoke with patient and her sister, [name] at bedside by phone. We discussed goals and expectations of a CIR admit. Patient is in agreement. I will follow up with Dr. Renae Gloss, acute team and [facility] to arrange Cir admit if cleared by cute MD for today.  Ottie Glazier, RN, MSN Rehab Admissions Coordinator [phone] 05/26/2021 9:07 AM

## 2021-05-26 NOTE — Progress Notes (Signed)
Patient ID: Laura [name], female   DOB: June 19, 1951, 70 y.o.   MRN: [id number] Met with the patient to introduce self and the role of the nurse CM. Reviewed educational needs with new dx of HTN, HLD (LDL 124/Trig 203) and DM (A1C 12.0 ). DAPT for 3 weeks then ASA solo per MD. Reviewed therapy schedule and plan of care with [facility] 7-10 days. Plans to go home with sister to assist as needed with MOD I goals. New dx of DM = insulin administration education and CMM diet modifications reviewed with the patient. Continue to follow along to discharge and address educational needs. Collaborate with the SW to facilitate preparation for discharge. Margarito Liner, RN

## 2021-05-26 NOTE — Progress Notes (Signed)
Inpatient Rehabilitation  Patient information reviewed and entered into eRehab system by Talonda Artist M. Jasman Pfeifle, M.A., CCC/SLP, PPS Coordinator.  Information including medical coding, functional ability and quality indicators will be reviewed and updated through discharge.    

## 2021-05-26 NOTE — Progress Notes (Signed)
Inpatient Rehabilitation Care Coordinator Assessment and Plan Patient Details  Name: Laura [name] MRN: [id number] Date of Birth: 1951/08/19  Today's Date: 05/26/2021  Hospital Problems: Principal Problem:   Stroke (cerebrum) [facility])  Past Medical History:  Past Medical History:  Diagnosis Date   Left forearm fracture    in high school--was casted   Past Surgical History:  Past Surgical History:  Procedure Laterality Date   FOOT SURGERY     TONSILLECTOMY     Social History:  reports that she has never smoked. She has never been exposed to tobacco smoke. She has never used smokeless tobacco. She reports that she does not drink alcohol and does not use drugs.  Family / Support Systems Marital Status: Single Patient Roles: Other (Comment) (sibling) Other Supports: [name]-sister [phone]  Rita-sister [employer], Peggy-Raliegh Anticipated Caregiver: Sister's Ability/Limitations of Caregiver: None Caregiver Availability: 24/7 (Trying to convince pt to go to one of their homes at [place]) Family Dynamics: Close knit with three sister's, all are involved and supportive. Each has offered for her to come stay with them at discharge. Pt is quite stubborn and wants to do on her own  Social History Preferred language: English Religion: None Cultural Background: No issues Education: Automotive engineer educated Read: Yes Write: Yes Employment Status: Retired Marine scientist Issues: No issues Guardian/Conservator: None-according to MD pt is not capable of making her own decisions while here. Will look toward her sister's since she has no formal POA/HCOA in place and no children   Abuse/Neglect Abuse/Neglect Assessment Can Be Completed: Yes Physical Abuse: Denies Verbal Abuse: Denies Sexual Abuse: Denies Exploitation of patient/patient's resources: Denies Self-Neglect: Denies  Emotional Status Pt's affect, behavior and adjustment status: Pt is wanting to get back home to her  cat and dog she misses them and spoils them rotten. She had to be convinced to go to the hospital due to didn't want to leave her pets. She is quite stubborn and does her own thing Recent Psychosocial Issues: has not been to a MD in years-unaware of her health issues Psychiatric History: No history deferred depression screen due to adjusting to the new unit and hospital. Will continue to assess her coping while here Substance Abuse History: No issues  Patient / Family Perceptions, Expectations & Goals Pt/Family understanding of illness & functional limitations: Pt and sister's preent in her room, can explain her stroke and deficits. All have spoken with the MD and feel they have a good understanding of her plan moving forward. Although pt feels she can return home independently-will await evaluations Premorbid pt/family roles/activities: Sister, retiree, aunt, pet parent, etc Anticipated changes in roles/activities/participation: resume Pt/family expectations/goals: Pt states: " I want to go back home and resume my life." Sister's state: " We want her to go home with one of [place] and will convince her of this."  [employer]: None Premorbid Home Care/DME Agencies: None Transportation available at discharge: self-wants to drive again. Sister's will need to transport to appointments until able to if MD recommends  Discharge Planning Living Arrangements: Alone Support Systems: Other relatives Type of Residence: Private residence Insurance Resources: [employer], Media planner (specify) Counselling psychologist) Financial Resources: Social Security Financial Screen Referred: No Living Expenses: Own Money Management: Patient Does the patient have any problems obtaining your medications?: No (Doesn;t go to the MD has no PCP) Home Management: self Patient/Family Preliminary Plans: Return home with one of her sister who can provide supervision level. They are currently taking care of her  pets, which pt  is worried about. Hopes to go home soon and be back to her independent level. Will await therapy evaluations Care Coordinator Barriers to Discharge: Lack of/limited family support Care Coordinator Barriers to Discharge Comments: Unsure how long can provide assist-sister's Care Coordinator Anticipated Follow Up Needs: HH/OP  Clinical Impression Pleasant yet stubborn female who is used to doing on her own and being independent. She worries about her cat and dog but both are being cared for by her sister's. Sister's hope she will go home with one of them at least for a short time. Will await therapy evaluations and work on getting a PCP for follow up at [place]. Sister's involved and supportive  Lucy Chris 05/26/2021, 3:49 PM

## 2021-05-27 DIAGNOSIS — I639 Cerebral infarction, unspecified: Secondary | ICD-10-CM

## 2021-05-27 LAB — GLUCOSE, CAPILLARY
Glucose-Capillary: 242 mg/dL — ABNORMAL HIGH (ref 70–99)
Glucose-Capillary: 257 mg/dL — ABNORMAL HIGH (ref 70–99)
Glucose-Capillary: 255 mg/dL — ABNORMAL HIGH (ref 70–99)
Glucose-Capillary: 289 mg/dL — ABNORMAL HIGH (ref 70–99)

## 2021-05-27 LAB — CBC WITH DIFFERENTIAL/PLATELET
Abs Immature Granulocytes: 0.02 10*3/uL (ref 0.00–0.07)
Basophils Absolute: 0 10*3/uL (ref 0.0–0.1)
Basophils Relative: 1 %
Eosinophils Absolute: 0.1 10*3/uL (ref 0.0–0.5)
Eosinophils Relative: 2 %
HCT: 40.7 % (ref 36.0–46.0)
Hemoglobin: 13.7 g/dL (ref 12.0–15.0)
Immature Granulocytes: 0 %
Lymphocytes Relative: 31 %
Lymphs Abs: 2.1 10*3/uL (ref 0.7–4.0)
MCH: 28.4 pg (ref 26.0–34.0)
MCHC: 33.7 g/dL (ref 30.0–36.0)
MCV: 84.4 fL (ref 80.0–100.0)
Monocytes Absolute: 0.5 10*3/uL (ref 0.1–1.0)
Monocytes Relative: 8 %
Neutro Abs: 3.9 10*3/uL (ref 1.7–7.7)
Neutrophils Relative %: 58 %
Platelets: 291 10*3/uL (ref 150–400)
RBC: 4.82 MIL/uL (ref 3.87–5.11)
RDW: 12.2 % (ref 11.5–15.5)
WBC: 6.6 10*3/uL (ref 4.0–10.5)
nRBC: 0 % (ref 0.0–0.2)

## 2021-05-27 LAB — COMPREHENSIVE METABOLIC PANEL
ALT: 21 U/L (ref 0–44)
AST: 20 U/L (ref 15–41)
Albumin: 3.2 g/dL — ABNORMAL LOW (ref 3.5–5.0)
Alkaline Phosphatase: 61 U/L (ref 38–126)
Anion gap: 9 (ref 5–15)
BUN: 16 mg/dL (ref 8–23)
CO2: 29 mmol/L (ref 22–32)
Calcium: 9.6 mg/dL (ref 8.9–10.3)
Chloride: 99 mmol/L (ref 98–111)
Creatinine, Ser: 1.05 mg/dL — ABNORMAL HIGH (ref 0.44–1.00)
GFR, Estimated: 58 mL/min — ABNORMAL LOW (ref >60)
Glucose, Bld: 255 mg/dL — ABNORMAL HIGH (ref 70–99)
Potassium: 4.2 mmol/L (ref 3.5–5.1)
Sodium: 137 mmol/L (ref 135–145)
Total Bilirubin: 0.6 mg/dL (ref 0.3–1.2)
Total Protein: 6.1 g/dL — ABNORMAL LOW (ref 6.5–8.1)

## 2021-05-27 LAB — IRON AND TIBC
Iron: 51 ug/dL (ref 28–170)
Saturation Ratios: 14 % (ref 10.4–31.8)
TIBC: 368 ug/dL (ref 250–450)
UIBC: 317 ug/dL

## 2021-05-27 LAB — VITAMIN B1: Vitamin B1 (Thiamine): 120.5 nmol/L (ref 66.5–200.0)

## 2021-05-27 LAB — MAGNESIUM: Magnesium: 2 mg/dL (ref 1.7–2.4)

## 2021-05-27 LAB — VITAMIN D 25 HYDROXY (VIT D DEFICIENCY, FRACTURES): Vit D, 25-Hydroxy: 11.31 ng/mL — ABNORMAL LOW (ref 30–100)

## 2021-05-27 MED ORDER — VITAMIN D (ERGOCALCIFEROL) 1.25 MG (50000 UNIT) PO CAPS
50000.0000 [IU] | ORAL_CAPSULE | ORAL | Status: DC
  Administered 2021-05-27: 50000 [IU] via ORAL
  Filled 2021-05-27: qty 1

## 2021-05-27 MED ORDER — JUVEN PO PACK
1.0000 | PACK | Freq: Two times a day (BID) | ORAL | Status: DC
  Administered 2021-05-27 – 2021-05-28 (×3): 1 via ORAL
  Filled 2021-05-27 (×5): qty 1

## 2021-05-27 MED ORDER — INSULIN GLARGINE 100 UNIT/ML ~~LOC~~ SOLN
11.0000 [IU] | Freq: Every day | SUBCUTANEOUS | Status: DC
  Administered 2021-05-27 – 2021-05-28 (×2): 11 [IU] via SUBCUTANEOUS
  Filled 2021-05-27 (×2): qty 0.11

## 2021-05-27 NOTE — Evaluation (Signed)
Physical Therapy Assessment and Plan  Patient Details  Name: Laura [name] MRN: [id number] Date of Birth: 07-16-1951  PT Diagnosis: Abnormality of gait, Cognitive deficits, Difficulty walking, Hemiparesis dominant, Impaired cognition, and Muscle weakness Rehab Potential: Good ELOS: 5-7 days   Today's Date: 05/27/2021 PT Individual Time: 1300-1415 PT Individual Time Calculation (min): 75 min    Hospital Problem: Principal Problem:   Stroke (cerebrum) ([place])   Past Medical History:  Past Medical History:  Diagnosis Date   Left forearm fracture    in high school--was casted   Past Surgical History:  Past Surgical History:  Procedure Laterality Date   FOOT SURGERY     TONSILLECTOMY      Assessment & Plan Clinical Impression: Patient is a 70 year old female in relatively good health but no medical care who was admitted on 05/23/21 with garbled speech, right facial droop, vision changes and  RUE weakness. Per reports, she started feeling weak on 06/14 and needed persuasion to come to hospital for care. 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  CTA head neck done was negative for AVM, aneurysms or stenosis.  2D echo done showing EF 65 to 70% with borderline LVH.  Blood sugar was 372 at admission with hemoglobin A1c at 12.0.  She was also noted to have low vitamin B12 levels of 97 and started on supplementation.  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  She continues to be limited by weakness with dysarthria, tachycardia --HR up to 130 with activity, balance deficits, motor planning deficits with ADLs as well as poor awareness of deficits.  CIR recommended due to functional decline. Plans for d/c to sister's home after dischargePatient transferred to CIR on 05/26/2021 .   Patient currently requires min with  mobility secondary to muscle weakness, decreased cardiorespiratoy endurance, unbalanced muscle activation, decreased problem solving, decreased safety awareness, and decreased memory, and decreased standing balance, hemiplegia, and decreased balance strategies.  Prior to hospitalization, patient was independent  with mobility and lived with Alone in a House home.  Home access is  Level entry.  Patient will benefit from skilled PT intervention to maximize safe functional mobility, minimize fall risk, and decrease caregiver burden for planned discharge home with 24 hour supervision.  Anticipate patient will benefit from follow up OP at discharge.  PT - End of Session Activity Tolerance: Endurance does not limit participation in activity Endurance Deficit: No PT Assessment Rehab Potential (ACUTE/IP ONLY): Good PT Barriers to Discharge: Inaccessible home environment;Home environment access/layout;Decreased caregiver support;Insurance for SNF coverage PT Patient demonstrates impairments in the following area(s): Balance;Motor;Perception PT Transfers Functional Problem(s): Bed Mobility;Bed to Chair;Car PT Locomotion Functional Problem(s): Ambulation;Stairs PT Plan PT Intensity: Minimum of 1-2 x/day ,45 to 90 minutes PT Frequency: 5 out of 7 days PT Duration Estimated Length of Stay: 5-7 days PT Treatment/Interventions: Ambulation/gait training;Balance/vestibular training;Cognitive remediation/compensation;Community reintegration;Discharge planning;Disease management/prevention;DME/adaptive equipment instruction;Functional mobility training;Pain management;Neuromuscular re-education;Patient/family education;Psychosocial support;Skin care/wound management;Stair training;Therapeutic Activities;Therapeutic Exercise;UE/LE Strength taining/ROM;UE/LE Coordination activities;Visual/perceptual remediation/compensation PT Transfers Anticipated Outcome(s): supervision PT Locomotion Anticipated Outcome(s):  supervision PT Recommendation Follow Up Recommendations: Outpatient PT;24 hour supervision/assistance Patient destination: Home Equipment Recommended: To be determined   PT Evaluation Precautions/Restrictions Precautions Precautions: Fall Precaution Comments: R inattention Restrictions Weight Bearing Restrictions: No Home Living/Prior Functioning Home Living Available Help at Discharge: Family;Available 24 hours/day Type of Home: House Home Access:  Level entry Home Layout: Two level;Able to live on main level with bedroom/bathroom Additional Comments: Per sisters in room, pt does not have working phone and no email address. 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 - Assessment Eye Alignment: Within Functional Limits Ocular Range of Motion: Within Functional Limits Alignment/Gaze Preference: Within Defined Limits Tracking/Visual Pursuits: Able to track stimulus in all quads without difficulty Perception Perception: Impaired Inattention/Neglect: Does not attend to right side of body;Does not attend to right visual field Praxis Praxis: Intact  Cognition Overall Cognitive Status: Impaired/Different from baseline Arousal/Alertness: Awake/alert Orientation Level: Oriented X4 Memory: Impaired Memory Impairment: Retrieval deficit Immediate Memory Recall: Sock;Blue;Bed Memory Recall Sock: Without Cue Memory Recall Blue: With Cue Memory Recall Bed: Without Cue Awareness: Impaired Problem Solving: Impaired Problem Solving Impairment: Functional basic;Functional complex Safety/Judgment: Impaired Sensation Sensation Light Touch: Appears Intact Hot/Cold: Appears  Intact Proprioception: Appears Intact Stereognosis: Appears Intact Coordination Gross Motor Movements are Fluid and Coordinated: No Fine Motor Movements are Fluid and Coordinated: Yes Coordination and Movement Description: minimally impaired RUE GMC with large movement patterns; able to open containers and tie shoes without difficulty Finger Nose Finger Test: slow on B sides and slightly dysmetric Heel Shin Test: WNL Motor  Motor Motor: Abnormal postural alignment and control Motor - Skilled Clinical Observations: leans to right   Trunk/Postural Assessment  Cervical Assessment Cervical Assessment: Within Functional Limits Thoracic Assessment Thoracic Assessment: Within Functional Limits Lumbar Assessment Lumbar Assessment: Within Functional Limits Postural Control Postural Control: Deficits on evaluation Trunk Control: leans to R Righting Reactions: delayed  Balance Balance Balance Assessed: Yes Standardized Balance Assessment Standardized Balance Assessment: Berg Balance Test;Timed Up and Go Test Berg Balance Test Sit to Stand: Able to stand without using hands and stabilize independently Standing Unsupported: Able to stand 2 minutes with supervision Sitting with Back Unsupported but Feet Supported on Floor or Stool: Able to sit safely and securely 2 minutes Stand to Sit: Sits safely with minimal use of hands Transfers: Able to transfer safely, definite need of hands Standing Unsupported with Eyes Closed: Able to stand 10 seconds safely Standing Ubsupported with Feet Together: Able to place feet together independently and stand for 1 minute with supervision From Standing, Reach Forward with Outstretched Arm: Can reach forward >12 cm safely (5") From Standing Position, Pick up Object from Floor: Able to pick up shoe, needs supervision From Standing Position, Turn to Look Behind Over each Shoulder: Looks behind from both sides and weight shifts well Turn 360 Degrees: Able to  turn 360 degrees safely but slowly Standing Unsupported, Alternately Place Feet on Step/Stool: Able to complete >2 steps/needs minimal assist Standing Unsupported, One Foot in Front: Needs help to step but can hold 15 seconds Standing on One Leg: Able to lift leg independently and hold equal to or more than 3 seconds Total Score: 41 Timed Up and Go Test TUG: Normal TUG (CGA with no AD) Normal TUG (seconds): 14.3 Static Sitting Balance Static Sitting - Level of Assistance: 5: Stand by assistance Dynamic Sitting Balance Dynamic Sitting - Level of Assistance: 4: Min assist (CGA) Static Standing Balance Static Standing - Level of Assistance: 5: Stand by assistance Dynamic Standing Balance Dynamic Standing - Level of Assistance: 4: Min assist Extremity Assessment  RUE Assessment Active Range of Motion (AROM) Comments: WFL General Strength Comments: WFL but decreased awareness of arm LUE Assessment LUE Assessment: Within Functional Limits RLE Assessment RLE Assessment: Exceptions to [facility] General Strength Comments: Grossly 4/5 LLE Assessment LLE Assessment: Exceptions to [facility] General Strength Comments: Grossly 4+/5  Care Tool Care Tool Bed Mobility Roll left and right activity        Sit to lying activity        Lying to sitting edge of bed activity   Lying to sitting edge of bed assist level: Supervision/Verbal cueing     Care Tool Transfers Sit to stand transfer   Sit to stand assist level: Contact Guard/Touching assist    Chair/bed transfer   Chair/bed transfer assist level: Minimal Assistance - Patient > 75%     Toilet transfer   Assist Level: Minimal Assistance - Patient > 75%    Car transfer          Care Tool Locomotion Ambulation          Walk 10 feet activity         Walk 50 feet with 2 turns activity        Walk 150 feet activity        Walk 10 feet on uneven surfaces activity        Stairs          Walk up/down 1 step activity           Walk up/down 4 steps activity      Walk up/down 12 steps activity        Pick up small objects from floor        Wheelchair            Wheel 50 feet with 2 turns activity      Wheel 150 feet activity        Refer to Care Plan for Long Term Goals  SHORT TERM GOAL WEEK 1 PT Short Term Goal 1 (Week 1): STG = LTG due to ELOS  Recommendations for other services: Neuropsych  Skilled Therapeutic Intervention Mobility Transfers Transfers: Sit to Stand;Stand to [employer] Transfers Sit to Stand: Contact Guard/Touching assist Stand to Sit: Contact Guard/Touching assist Stand Pivot Transfers: Contact Guard/Touching assist Transfer (Assistive device): None Locomotion  Gait Ambulation: Yes Gait Assistance: Minimal Assistance - Patient > 75% Gait Distance (Feet): 200 Feet Assistive device: None Gait Assistance Details: Tactile cues for weight shifting;Visual cues/gestures for precautions/safety;Verbal cues for precautions/safety;Verbal cues for sequencing;Verbal cues for technique;Verbal cues for gait pattern Gait Gait: Yes Gait Pattern: Impaired Gait Pattern: Step-through pattern;Narrow base of support;Poor foot clearance - left;Poor foot clearance - right;Decreased step length - right;Decreased step length - left Stairs / Additional Locomotion Stairs: Yes Stairs Assistance: Minimal Assistance - Patient > 75% Stair Management Technique: Two rails Number of Stairs: 8 Height of Stairs: 6 Ramp: Contact Guard/touching assist Curb: Minimal Assistance - Patient >75% Wheelchair Mobility Wheelchair Mobility: No  Skilled Intervention: Pt received sitting in recliner, finishing up her lunch. Agreeable to PT evaluation. Sister at bedside who assisted with PLOF and social factors as pt ate lunch. Pt pleasant and cooperative throughout session, able to follow simple commands consistently and accurately. Mild R hemi weakness from her CVA. Her speech is dysarthric and there is  some expressive aphasia vs word finding difficulties. She is able to complete sit<>stand transfers with CGA and no AD and ambulates ~261f with CGA (intermittent minA with turns) and no AD  throughout session. She completes car transfers with CGA and no AD, ambulates up/down ramp with CGA and through mulch pit with minA. She navigates up/down x8 steps with CGA and 2 hand rails with self selected reciprocal pattern. She scored 41/56 on the BERG (see details above) placing her at a increased falls risk. Tug x3 trials with CGA and no AD: 1) 16 seconds 2) 14 seconds 3) 13 seconds AVG = 14.3 seconds *Score >13.5 seconds indicates increased falls risk  Pt ambulated back to her room with minA and no AD, difficulty locating her room despite telling her room #. Once we arrived at her room, she reported need to use the bathroom - unfortunately her handle to bathroom door was broken so we had to return to rehab gym to use public toilet - minA guard for ambulation and pt continent of bladder. Returned to her room with minA and no AD and pt requesting to remain seated EOB at end of session. Bed alarm on and all needs met.  Instructed pt in results of PT evaluation as detailed above, PT POC, rehab potential, rehab goals, and discharge recommendations. Additionally discussed CIR's policies regarding fall safety and use of chair alarm and/or quick release belt. Pt verbalized understanding and in agreement. Will update pt's family members as they become available.   Discharge Criteria: Patient will be discharged from PT if patient refuses treatment 3 consecutive times without medical reason, if treatment goals not met, if there is a change in medical status, if patient makes no progress towards goals or if patient is discharged from hospital.  The above assessment, treatment plan, treatment alternatives and goals were discussed and mutually agreed upon: by patient  Alger Simons PT, DPT 05/27/2021, 3:57 PM

## 2021-05-27 NOTE — Progress Notes (Signed)
Patient ID: Laura [name], female   DOB: 1951/04/03, 70 y.o.   MRN: [id number] Met with pt and older sister who is here today to inform the MD she requested is not taking new patients. She is agreeable to have worker call [facility] and see who is taking new patients, she does prefer a female. Family also wants a chaplian referral to work on advanced directives. Pam-PA made aware of this.

## 2021-05-27 NOTE — Evaluation (Signed)
Occupational Therapy Assessment and Plan  Patient Details  Name: Laura [name] MRN: [id number] Date of Birth: 10/02/1951  OT Diagnosis: cognitive deficits and hemiplegia affecting dominant side Rehab Potential: Rehab Potential (ACUTE ONLY): Excellent ELOS: 5-7 days   Today's Date: 05/27/2021 OT Individual Time: 7793-9030 OT Individual Time Calculation (min): 60 min     Hospital Problem: Principal Problem:   Stroke (cerebrum) ([place])   Past Medical History:  Past Medical History:  Diagnosis Date   Left forearm fracture    in high school--was casted   Past Surgical History:  Past Surgical History:  Procedure Laterality Date   FOOT SURGERY     TONSILLECTOMY      Assessment & Plan Clinical Impression: Laura [name] is a 70 year old female in relatively good health but no medical care who was admitted on 05/23/21 with garbled speech, right facial droop, vision changes and  RUE weakness. Per reports, she started feeling weak on 06/14 and needed persuasion to come to hospital for care. 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  CTA head neck done was negative for AVM, aneurysms or stenosis.  2D echo done showing EF 65 to 70% with borderline LVH.  Blood sugar was 372 at admission with hemoglobin A1c at 12.0.  She was also noted to have low vitamin B12 levels of 97 and started on supplementation.  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  She continues to be limited by weakness with dysarthria, tachycardia --HR up to 130 with activity, balance deficits, motor planning deficits with ADLs as well as poor awareness of deficits.  CIR recommended due to functional decline. Plans for d/c to sister's home after discharge. She currently has no complaints.   Patient transferred to CIR on 05/26/2021 .    Patient currently  requires min with basic self-care skills secondary to decreased coordination, decreased attention to right, decreased awareness, decreased problem solving, decreased safety awareness, decreased memory, and delayed processing, and decreased sitting balance, decreased standing balance, decreased postural control, and decreased balance strategies.  Prior to hospitalization, patient was fully independent.   Patient will benefit from skilled intervention to increase independence with basic self-care skills prior to discharge home with care partner.  Anticipate patient will require 24 hour supervision and follow up home health.  OT - End of Session Activity Tolerance: Tolerates 30+ min activity without fatigue Endurance Deficit: No OT Assessment Rehab Potential (ACUTE ONLY): Excellent OT Patient demonstrates impairments in the following area(s): Balance;Cognition;Safety;Perception;Motor OT Basic ADL's Functional Problem(s): Bathing;Dressing;Toileting OT Advanced ADL's Functional Problem(s): Light Housekeeping OT Transfers Functional Problem(s): Toilet;Tub/Shower OT Additional Impairment(s): None OT Plan OT Intensity: Minimum of 1-2 x/day, 45 to 90 minutes OT Frequency: 5 out of 7 days OT Duration/Estimated Length of Stay: 5-7 days OT Treatment/Interventions: Balance/vestibular training;Cognitive remediation/compensation;Discharge planning;DME/adaptive equipment instruction;Functional mobility training;Neuromuscular re-education;Psychosocial support;Patient/family education;Self Care/advanced ADL retraining;Therapeutic Activities;Therapeutic Exercise;UE/LE Strength taining/ROM;UE/LE Coordination activities OT Self Feeding Anticipated Outcome(s): independent OT Basic Self-Care Anticipated Outcome(s): supervision OT Toileting Anticipated Outcome(s): supervision OT Bathroom Transfers Anticipated Outcome(s): supervision OT Recommendation Patient destination: Home Follow Up Recommendations: Home health  OT Equipment Recommended: To be determined Equipment Details: need to discuss with pt's sister   OT Evaluation Precautions/Restrictions  Precautions Precautions: Fall Precaution Comments: R inattention Pain Pain Assessment Pain Scale: 0-10 Pain Score: 0-No pain Home Living/Prior Functioning Home Living Family/patient expects to  be discharged to:: Private residence Living Arrangements: Alone Available Help at Discharge: Family, Available 24 hours/day Type of Home: House Home Access: Level entry Home Layout: Two level, Able to live on main level with bedroom/bathroom Bathroom Shower/Tub: Walk-in shower, Door [employer] Toilet: Standard Bathroom Accessibility: Yes Additional Comments: Per sisters in room, pt does not have working phone and no email address. 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 Baseline Vision/History: Wears glasses Wears Glasses: Distance only Patient Visual Report: Blurring of vision Vision Assessment?: Yes Eye Alignment: Within Functional Limits Ocular Range of Motion: Within Functional Limits Alignment/Gaze Preference: Within Defined Limits Tracking/Visual Pursuits: Able to track stimulus in all quads without difficulty (in B superior fields pt had slight extra head turn) Visual Fields: No apparent deficits Perception  Perception: Impaired Inattention/Neglect: Does not attend to right side of body;Does not attend to right visual field Praxis Praxis: Intact Cognition Overall Cognitive Status: Impaired/Different from baseline Arousal/Alertness: Awake/alert Orientation Level: Person;Situation (kept saying "[place]") Year: 2022 Month: June Day of Week: Correct Memory: Impaired Memory Impairment: Retrieval deficit Immediate Memory Recall: Sock;Blue;Bed Memory Recall Sock: Without Cue Memory Recall Blue: With Cue Memory Recall Bed: Without Cue Awareness: Impaired (decreased awareness of postural position) Problem Solving: Impaired Problem Solving Impairment: Functional basic Safety/Judgment: Impaired Sensation Sensation Light Touch: Appears Intact Hot/Cold: Appears Intact Proprioception: Appears Intact Stereognosis: Appears Intact Coordination Gross Motor Movements are Fluid and Coordinated: No Fine Motor Movements are Fluid and Coordinated: Yes Coordination and Movement Description: minimally impaired RUE GMC with large movement patterns; able to open containers and tie shoes without difficulty Finger Nose Finger Test: slow on B sides and slightly dysmetric Motor  Motor Motor: Abnormal postural alignment and control Motor - Skilled Clinical Observations: leans to right  Trunk/Postural Assessment  Cervical Assessment Cervical Assessment: Within Functional Limits Thoracic Assessment Thoracic Assessment: Within Functional Limits Lumbar Assessment Lumbar Assessment: Within Functional Limits Postural Control Postural Control: Deficits on evaluation Trunk Control: leans to R Righting Reactions: delayed  Balance Static Sitting Balance Static Sitting - Level of Assistance: 5: Stand by assistance Dynamic Sitting Balance Dynamic Sitting - Level of Assistance: 4: Min assist (CGA) Static Standing Balance Static Standing - Level of Assistance: 5: Stand by assistance Dynamic Standing Balance Dynamic Standing - Level of Assistance: 4: Min assist Extremity/Trunk Assessment RUE Assessment Active Range of Motion (AROM) Comments: WFL General Strength Comments: WFL but decreased awareness of arm LUE Assessment LUE Assessment: Within Functional Limits  Care Tool Care Tool Self  Care Eating   Eating Assist Level: Set up assist    Oral Care    Oral Care Assist Level: Supervision/Verbal cueing    Bathing   Body parts bathed by patient: Right arm;Left arm;Abdomen;Chest;Front perineal area;Buttocks;Face;Left lower leg;Right lower leg;Left upper leg;Right upper leg     Assist Level: Contact Guard/Touching assist    Upper Body Dressing(including orthotics)   What is the patient wearing?: Pull over shirt   Assist Level: Set up assist    Lower Body Dressing (excluding footwear)   What is the patient wearing?: Underwear/pull up;Pants Assist for lower body dressing: Contact Guard/Touching assist    Putting on/Taking off footwear   What is  the patient wearing?: Socks;Shoes Assist for footwear: Supervision/Verbal cueing       Care Tool Toileting Toileting activity   Assist for toileting: Contact Guard/Touching assist     Care Tool Bed Mobility Roll left and right activity        Sit to lying activity        Lying to sitting edge of bed activity   Lying to sitting edge of bed assist level: Supervision/Verbal cueing     Care Tool Transfers Sit to stand transfer   Sit to stand assist level: Contact Guard/Touching assist    Chair/bed transfer   Chair/bed transfer assist level: Minimal Assistance - Patient > 75%     Toilet transfer   Assist Level: Minimal Assistance - Patient > 75%     Care Tool Cognition Expression of Ideas and Wants Expression of Ideas and Wants: Without difficulty (complex and basic) - expresses complex messages without difficulty and with speech that is clear and easy to understand   Understanding Verbal and Non-Verbal Content Understanding Verbal and Non-Verbal Content: Understands (complex and basic) - clear comprehension without cues or repetitions   Memory/Recall Ability *first 3 days only Memory/Recall Ability *first 3 days only: Current season;Location of own room    Refer to Care Plan for [place] 1 OT Short Term Goal 1 (Week 1): STGs = LTGs  Recommendations for other services: None    Skilled Therapeutic Intervention ADL ADL Eating: Set up Grooming: Supervision/safety Where Assessed-Grooming: Standing at sink Upper Body Bathing: Supervision/safety Where Assessed-Upper Body Bathing: Shower Lower Body Bathing: Contact guard Where Assessed-Lower Body Bathing: Shower Upper Body Dressing: Setup Where Assessed-Upper Body Dressing: Edge of bed Lower Body Dressing: Contact guard Where Assessed-Lower Body Dressing: Edge of bed Toileting: Contact guard Where Assessed-Toileting: Glass blower/designer: Psychiatric nurse Method: Counselling psychologist: Energy manager: Environmental education officer Method: Heritage manager: Civil engineer, contracting with back;Grab bars   Pt seen for initial evaluation and ADL training with a focus on R side awareness and balance. Explained role and purpose of OT and pt very agreeable to all activities.  Pt did very well today but had difficulty with R side awareness, postural control and problem solving. Pt does have expressive aphasia but for the most part does communicate fairly well.  Pt may be able to reach a mod I level, but discussed with pt that I am setting S goals for now due to her memory/awareness deficits.  Reviewed LOS and OT POC.  Pt resting in recliner with alarm belt on and all needs met.    Discharge Criteria: Patient will be discharged from OT if patient refuses treatment 3 consecutive times without medical reason, if treatment goals not met, if there is a change in medical status, if patient makes no progress towards goals or if patient is discharged from hospital.  The above assessment, treatment plan, treatment alternatives and goals were discussed and mutually agreed upon: by patient  [facility] 05/27/2021, 1:03 PM

## 2021-05-27 NOTE — Evaluation (Signed)
Speech Language Pathology Assessment and Plan  Patient Details  Name: Laura [name] MRN: [id number] Date of Birth: 1951-10-07  SLP Diagnosis: Aphasia;Cognitive Impairments  Rehab Potential: Good ELOS: 5-7 days    Today's Date: 05/27/2021 SLP Individual Time:  -      Hospital Problem: Principal Problem:   Stroke (cerebrum) [facility])  Past Medical History:  Past Medical History:  Diagnosis Date   Left forearm fracture    in high school--was casted   Past Surgical History:  Past Surgical History:  Procedure Laterality Date   FOOT SURGERY     TONSILLECTOMY      Assessment / Plan / Recommendation  Laura [name] is a 70 year old female in relatively good health but no medical care who was admitted on 05/23/21 with garbled speech, right facial droop, vision changes and  RUE weakness. Per reports, she started feeling weak on 06/14 and needed persuasion to come to hospital for care. 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  CTA head neck done was negative for AVM, aneurysms or stenosis.  2D echo done showing EF 65 to 70% with borderline LVH.  Blood sugar was 372 at admission with hemoglobin A1c at 12.0.  She was also noted to have low vitamin B12 levels of 97 and started on supplementation.  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  She continues to be limited by weakness with dysarthria, tachycardia --HR up to 130 with activity, balance deficits, motor planning deficits with ADLs as well as poor awareness of deficits.  CIR recommended due to functional decline. Plans for d/c to sister's home after discharge. She currently has no complaints.   Patient transferred to CIR on 05/26/2021 .    Clinical Impression Patient presents with a moderate expressive aphasia (likely transcortical motor) but with largely intact  receptive language. She does present with mild cognitive impairments impacting memory and awareness as well. Patient's language strengths are in repetition, confrontational naming, comprehension of spoken language as well as reading comprehension at word level. She exhibits weaknesses in category naming, divergent naming, sentence formulation, phrase level describing and sentence level reading. She exhibits phonemic and semantic paraphasias and although she does exhibit awareness at word level, she is inconsistent with her awareness at phrase or sentence level. Overall fluency at phrase, sentence and conversational level was impaired. SLP focused evaluation on language impairment, and due to the estimated short LOS, cognitive function goals may need to be addressed at next venue of care. SLP is recommending f/u outpatient SLP after discharge from CIR.  Skilled Therapeutic Interventions          Speech-language evaluation  SLP Assessment  Patient will need skilled [place] [facility] during CIR admission    Recommendations  Recommendations for Other Services: Neuropsych consult Patient destination: Home Follow up Recommendations: Outpatient SLP;24 hour supervision/assistance Equipment Recommended: None recommended by SLP    SLP Frequency 3 to 5 out of 7 days   SLP Duration  SLP Intensity  SLP Treatment/Interventions 5-7 days  Minumum of 1-2 x/day, 30 to 90 minutes  Speech/Language facilitation;Cognitive remediation/compensation;Patient/family education;Therapeutic Activities;Cueing hierarchy    Pain Pain Assessment Pain Scale: 0-10 Pain Score: 0-No pain  Prior Functioning Cognitive/Linguistic Baseline: Within functional limits Type of Home: House  Lives With: Alone Available Help at Discharge: Family;Available 24 hours/day Vocation: Retired  Programmer, systems  Overall Cognitive Status: Impaired/Different from baseline Arousal/Alertness:  Awake/alert Orientation Level: Oriented X4 Attention: Selective Selective Attention: Impaired Selective Attention Impairment: Verbal basic;Functional basic Memory: Impaired Memory Impairment: Retrieval deficit;Decreased recall of new information Awareness: Impaired Awareness Impairment: Emergent impairment Problem Solving: Impaired Problem Solving Impairment: Functional basic;Functional complex Safety/Judgment: Impaired  Comprehension Auditory Comprehension Overall Auditory Comprehension: Appears within functional limits for tasks assessed Expression Expression Primary Mode of Expression: Verbal Verbal Expression Overall Verbal Expression: Impaired Initiation: No impairment Level of Generative/Spontaneous Verbalization: Sentence;Phrase Repetition: No impairment Naming: Impairment Confrontation: Within functional limits Convergent: 25-49% accurate Divergent: 50-74% accurate Verbal Errors: Semantic paraphasias;Phonemic paraphasias;Aware of errors Pragmatics: No impairment Interfering Components: Attention Effective Techniques: Semantic cues;Open ended questions;Sentence completion Non-Verbal Means of Communication: Not applicable Written Expression Dominant Hand: Right Written Expression: Exceptions to [facility] (wrote name and address without any omissions but handwriting quality impaired (sister showed SLP her premorbid handwriting).) Oral Motor Oral Motor/Sensory Function Overall Oral Motor/Sensory Function: Mild impairment Facial ROM: Reduced right Facial Symmetry: Abnormal symmetry right Facial Strength: Reduced right Lingual ROM: Within Functional Limits Lingual Symmetry: Within Functional Limits Lingual Strength: Within Functional Limits Lingual Sensation: Within Functional Limits Velum: Within Functional Limits Mandible: Within Functional Limits Motor Speech Overall Motor Speech: Impaired Respiration: Within functional limits Phonation: Normal Resonance: Within  functional limits Articulation: Impaired Level of Impairment: Sentence Intelligibility: Intelligibility reduced Word: 75-100% accurate Phrase: 75-100% accurate Sentence: 75-100% accurate Conversation: 50-74% accurate Motor Planning: Witnin functional limits  Care Tool Care Tool Cognition Expression of Ideas and Wants     Understanding Verbal and Non-Verbal Content     Memory/Recall Ability *first 3 days only      Short Term Goals: Week 1: SLP Short Term Goal 1 (Week 1): STG=LTG (estimated LOS 5-7 days)  Refer to Care Plan for Long Term Goals  Recommendations for other services: Neuropsych  Discharge Criteria: Patient will be discharged from SLP if patient refuses treatment 3 consecutive times without medical reason, if treatment goals not met, if there is a change in medical status, if patient makes no progress towards goals or if patient is discharged from hospital.  The above assessment, treatment plan, treatment alternatives and goals were discussed and mutually agreed upon: by patient and by family  Sonia Baller, MA, CCC-SLP Speech Therapy

## 2021-05-27 NOTE — Progress Notes (Signed)
PROGRESS NOTE   Subjective/Complaints: No complaints this morning Reviewed her labwork with her Encouraged her to limit added sugar in diet given elevated CBGs to 330  ROS: denies pain   Objective:   No results found. Recent Labs    05/26/21 0453 05/27/21 0430  WBC 7.1 6.6  HGB 13.8 13.7  HCT 40.7 40.7  PLT 292 291   Recent Labs    05/26/21 0453 05/27/21 0430  NA 137 137  K 4.6 4.2  CL 102 99  CO2 30 29  GLUCOSE 233* 255*  BUN 20 16  CREATININE 0.97 1.05*  CALCIUM 9.1 9.6    Intake/Output Summary (Last 24 hours) at 05/27/2021 1328 Last data filed at 05/27/2021 0758 Gross per 24 hour  Intake 720 ml  Output --  Net 720 ml        Physical Exam: Vital Signs Blood pressure 126/64, pulse 80, temperature 98.1 F (36.7 C), resp. rate 14, height 5\' 8"  (1.727 m), weight 71.6 kg, SpO2 96 %. Gen: no distress, normal appearing HEENT: oral mucosa pink and moist, NCAT Cardio: Reg rate Chest: normal effort, normal rate of breathing Abd: soft, non-distended Ext: no edema Psych: pleasant, normal affect Skin: intact Neuro: Alert and oriented x3 (off 1 day with date). Makes good eye contact. +facial droop. 4/5 right sided grip strength. Strength is otherwise intact. Sensation intact. Mild dysarthria, word finding difficulty, slowed speech     Assessment/Plan: 1. Functional deficits which require 3+ hours per day of interdisciplinary therapy in a comprehensive inpatient rehab setting. Physiatrist is providing close team supervision and 24 hour management of active medical problems listed below. Physiatrist and rehab team continue to assess barriers to discharge/monitor patient progress toward functional and medical goals  Care Tool:  Bathing    Body parts bathed by patient: Right arm, Left arm, Abdomen, Chest, Front perineal area, Buttocks, Face, Left lower leg, Right lower leg, Left upper leg, Right upper leg          Bathing assist Assist Level: Contact Guard/Touching assist     Upper Body Dressing/Undressing Upper body dressing   What is the patient wearing?: Pull over shirt    Upper body assist Assist Level: Set up assist    Lower Body Dressing/Undressing Lower body dressing      What is the patient wearing?: Underwear/pull up, Pants     Lower body assist Assist for lower body dressing: Contact Guard/Touching assist     Toileting Toileting    Toileting assist Assist for toileting: Contact Guard/Touching assist     Transfers Chair/bed transfer  Transfers assist     Chair/bed transfer assist level: Minimal Assistance - Patient > 75%     Locomotion Ambulation   Ambulation assist              Walk 10 feet activity   Assist           Walk 50 feet activity   Assist           Walk 150 feet activity   Assist           Walk 10 feet on uneven surface  activity   Assist  Wheelchair     Assist               Wheelchair 50 feet with 2 turns activity    Assist            Wheelchair 150 feet activity     Assist          Blood pressure 126/64, pulse 80, temperature 98.1 F (36.7 C), resp. rate 14, height 5\' 8"  (1.727 m), weight 71.6 kg, SpO2 96 %.    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             Admit to CIR 2.  Impaired mobility, ambulating 30 feet -DVT/anticoagulation:  Pharmaceutical: Continue Lovenox             -antiplatelet therapy: DAPT X 3 weeks followed by ASA alone. 3. Pain Management: N/A 4. Mood: LCSW to follow for evaluation and support.             -antipsychotic agents: N/A 5. Neuropsych: This patient is not capable of making decisions on her own behalf. 6. Skin/Wound Care:  Routine  pressure relief measures. 7. Fluids/Electrolytes/Nutrition: Monitor I/O. Check lytes in am.  8. T2DM: Hgb A1C- 12.0. Will monitor BS ac/hs and titrate medications as indicated.             --on Lantus 10 and 3 units novolog for meal coverage.  Increase lantus to 11U             --continue to reinforce DM education. 9. Dyslipidemia: Continue Lipitor. 10. B 12 deficiency: Level 97. Received 1000  mcg/IM on 06/20 and repeat weekly X 3 for supplementation. --continue oral supplement. 11. HTN: Monitor BP tid--well controlled, continue 5mg  Norvasc daily, well controlled 12. Severe vitamin D deficiency: 11- supplement with ergocalciferol 50,000U weekly for 7 weeks, repeat level outpatient.  13. AKI: encourage hydration  LOS: 1 days A FACE TO FACE EVALUATION WAS PERFORMED  Koy Lamp P Shalisa Mcquade 05/27/2021, 1:28 PM

## 2021-05-28 LAB — GLUCOSE, CAPILLARY
Glucose-Capillary: 183 mg/dL — ABNORMAL HIGH (ref 70–99)
Glucose-Capillary: 300 mg/dL — ABNORMAL HIGH (ref 70–99)
Glucose-Capillary: 240 mg/dL — ABNORMAL HIGH (ref 70–99)
Glucose-Capillary: 212 mg/dL — ABNORMAL HIGH (ref 70–99)

## 2021-05-28 MED ORDER — INSULIN GLARGINE 100 UNIT/ML ~~LOC~~ SOLN
12.0000 [IU] | Freq: Every day | SUBCUTANEOUS | Status: DC
  Administered 2021-05-29 – 2021-05-30 (×2): 12 [IU] via SUBCUTANEOUS
  Filled 2021-05-28 (×3): qty 0.12

## 2021-05-28 MED ORDER — JUVEN PO PACK
1.0000 | PACK | Freq: Two times a day (BID) | ORAL | Status: DC
  Administered 2021-05-28 – 2021-06-02 (×10): 1 via ORAL
  Filled 2021-05-28 (×11): qty 1

## 2021-05-28 NOTE — Progress Notes (Signed)
Physical Therapy Session Note  Patient Details  Name: Laura [name] MRN: [id number] Date of Birth: [birth year]  Today's Date: 05/28/2021 PT Individual Time: 0900-1000 + 1300-1326 PT Individual Time Calculation (min): 60 min  + 26 min  Short Term Goals: Week 1:  PT Short Term Goal 1 (Week 1): STG = LTG due to ELOS  Skilled Therapeutic Interventions/Progress Updates:   1st session:   Pt greeted supine in bed to start session and is agreeable to PT tx - no reports of pain but requests use of bathroom. Supine<>sit completed with supervision with use of bed features. Sit<>stand with CGA and no AD and ambulates to bathroom, 47f, with CGA and no AD. She's able to manage lower body dressing in standing with CGA and noted to have loose stool in pants, incontinent of bowel - pt reports she received an enema recently. Pt then continent of bowel and bladder and then able to remove dirty underwear and shorts without assist. Provided wash cloths for her to clean herself up which she did with setupA but needs cues for throughness. Provided her new clean underwear and pants - able to put on without assistance. Sit<>stand with CGA and no AD and ambulated sinkside where she washed her hands and brushed her teeth with CGA for balance - no difficulty noted with opening/applying toothpaste. She then sat down on the EOB with CGA and donned her tennis shoes without assistance. Then focused remainder of session on functional gait training throughout hospital and outdoors near reflection fountain. She ambulated several hundred feet with CGA and no AD with cues for reducing downward gaze and visual scanning to her R. She had a standing rest break in the elevator with CGA and then ambulated outdoors on unlevel concrete with CGA and no AD - increased foot drag occasionally that was self corrected but no formal LOB or knee buckling present. While outdoors, practiced safety approach transfers to various sitting surfaces such as park  benches and chairs without armrests. She also negotiated down 6 + 6 steps with L HR support and minA for balance. She practiced crossing cross-walks on the street with CGA and cues for visual scanning for cars and safety. She then practiced walking around hospital gift shop with CGA and only min cues for scanning to the R to avoid obstacles in the store. After a brief seated rest break, she ambulated back upstairs to her room with CGA and no AD (standing rest break in elevator) and returned to her room. She requested to remain seated EOB at end of session - reminded her of "call don't fall" policy which she voiced understanding. All needs met and bed alarm on.   2nd session: Pt received supine in bed to start session and is agreeable to PT tx even though she reports wanting to take a nap. Pt unable to recall prior therapy events from this morning but was able with max cues. Supine<>sit completed with supervision with use of bed features. Donned tennis shoes with setupA while seated EOB. Sit<>stand with CGA and no AD and then ambulated ~1562fto ortho gym with CGA and no AD. Focused remainder of session on NMR with BITS system - completed standing on blue foam pad with CGA for balance to complete the following: - trial making task #1-#25 - able to follow numbers in sequential manner with 100% accuracy but disruptions noted in line making due visually scan -user paced target reaching with 2.5# ankle weight to distal RUE to incorporate strengthening and FMC/GMC  for accuracy to small targets.  Ambulated back to her room with CGA and no AD and ended session supine in bed with all needs met, bed alarm on.   Therapy Documentation Precautions:  Precautions Precautions: Fall Precaution Comments: R inattention Restrictions Weight Bearing Restrictions: No General:    Therapy/Group: Individual Therapy  Laura [name] 05/28/2021, 7:45 AM

## 2021-05-28 NOTE — Progress Notes (Signed)
Occupational Therapy Session Note  Patient Details  Name: Laura [name] MRN: [id number] Date of Birth: 01/22/1951  Today's Date: 05/28/2021 OT Individual Time: 1103-1203 OT Individual Time Calculation (min): 60 min    Short Term Goals: Week 1:  OT Short Term Goal 1 (Week 1): STGs = LTGs  Skilled Therapeutic Interventions/Progress Updates:    Pt semi upright in bed, reports her right hand is giving her the most trouble since the stroke.  No c/o pain throughout session.  Pt completed supine to sit with distant supervision.  Sit to stand and ambulation of approximately 250 feet without AD needing close supervision and intermittent CGA. Assessed right fine motor control and coordination in seated position at tabletop and results were the following:  Box and Blocks test: Right hand 34 blocks ; Left hand 45 blocks  9 Hole Peg test:  Right hand 57 seconds; Left hand 22 seconds  Grip strength: Right hand 38 lbs; Left hand 55 lbs  Lat pinch strength: right hand 10 lbs; Left hand 15 lbs  3 jaw chuck pinch strength: right hand 6 lbs; left hand 10 lbs  Decreased fine motor coordination and control as wells as decreased strength in gross grip, lat pinch, and 3 jaw chuck positions noted in right dominant hand compared to the left non-dominant.  Provided pt with various fine motor tasks including in hand palm<>finger translation of extra small pegs, deck of card dealing and flipping, and handwriting preparatory task.  Pt ambulated back to room without AD with CGA.  Returned to bed with supervision, call bell in reach, bed alarm on.    Therapy Documentation Precautions:  Precautions Precautions: Fall Precaution Comments: R inattention Restrictions Weight Bearing Restrictions: No    Therapy/Group: Individual Therapy  Amie Critchley 05/28/2021, 12:56 PM

## 2021-05-28 NOTE — Progress Notes (Signed)
Speech Language Pathology Daily Session Note  Patient Details  Name: Laura [name] MRN: [id number] Date of Birth: [birth year]  Today's Date: 05/28/2021 SLP Individual Time: 1030-1100 SLP Individual Time Calculation (min): 30 min  Short Term Goals: Week 1: SLP Short Term Goal 1 (Week 1): STG=LTG (estimated LOS 5-7 days)  Skilled Therapeutic Interventions:   Patient seen with student ST observer present in room for skilled ST session focusing on expressive aphasia goals. Patient awake, lying in bed and participated fully during session. She was able to tell SLP that her right hand was moving better today. Accuracy with categorical naming with more abstract items improved during today's session with SLP able to decrease cues from modA to min-modA. Patient did exhibit phonemic paraphasias, semantic paraphasias with modA cues for awareness. She named 5 items in a given category with minA for one category but mod-maxA for another due to patient becoming perseverative on a particular word/item. Patient continues to benefit from skilled SLP intervention to maximize expressive language and cognitive goals prior to discharge.  Pain Pain Assessment Pain Scale: 0-10 Pain Score: 0-No pain  Therapy/Group: Individual Therapy  Angela Nevin, MA, CCC-SLP Speech Therapy

## 2021-05-28 NOTE — Progress Notes (Signed)
PROGRESS NOTE   Subjective/Complaints: No complaints this morning She is drinking her first cup of water today, says she would like more Content with therapy  ROS: denies pain, constipation   Objective:   No results found. Recent Labs    05/26/21 0453 05/27/21 0430  WBC 7.1 6.6  HGB 13.8 13.7  HCT 40.7 40.7  PLT 292 291   Recent Labs    05/26/21 0453 05/27/21 0430  NA 137 137  K 4.6 4.2  CL 102 99  CO2 30 29  GLUCOSE 233* 255*  BUN 20 16  CREATININE 0.97 1.05*  CALCIUM 9.1 9.6    Intake/Output Summary (Last 24 hours) at 05/28/2021 1056 Last data filed at 05/28/2021 0750 Gross per 24 hour  Intake 1200 ml  Output --  Net 1200 ml        Physical Exam: Vital Signs Blood pressure 129/63, pulse 73, temperature 98.2 F (36.8 C), resp. rate 16, height 5\' 8"  (1.727 m), weight 71.6 kg, SpO2 96 %. Gen: no distress, normal appearing HEENT: oral mucosa pink and moist, NCAT Cardio: Reg rate Chest: normal effort, normal rate of breathing Abd: soft, non-distended Ext: no edema Psych: pleasant, normal affect Psych: pleasant, normal affect Skin: intact Neuro: Alert and oriented x3 (off 1 day with date). Makes good eye contact. +facial droop. 4/5 right sided grip strength. Strength is otherwise intact. Sensation intact. Mild dysarthria, word finding difficulty, slowed speech. Moderate expressive aphasia.    Assessment/Plan: 1. Functional deficits which require 3+ hours per day of interdisciplinary therapy in a comprehensive inpatient rehab setting. Physiatrist is providing close team supervision and 24 hour management of active medical problems listed below. Physiatrist and rehab team continue to assess barriers to discharge/monitor patient progress toward functional and medical goals  Care Tool:  Bathing    Body parts bathed by patient: Right arm, Left arm, Abdomen, Chest, Front perineal area, Buttocks, Face,  Left lower leg, Right lower leg, Left upper leg, Right upper leg         Bathing assist Assist Level: Contact Guard/Touching assist     Upper Body Dressing/Undressing Upper body dressing   What is the patient wearing?: Pull over shirt    Upper body assist Assist Level: Set up assist    Lower Body Dressing/Undressing Lower body dressing      What is the patient wearing?: Underwear/pull up     Lower body assist Assist for lower body dressing: Supervision/Verbal cueing     Toileting Toileting    Toileting assist Assist for toileting: Contact Guard/Touching assist     Transfers Chair/bed transfer  Transfers assist     Chair/bed transfer assist level: Minimal Assistance - Patient > 75%     Locomotion Ambulation   Ambulation assist              Walk 10 feet activity   Assist           Walk 50 feet activity   Assist           Walk 150 feet activity   Assist           Walk 10 feet on uneven surface  activity  Assist           Wheelchair     Assist               Wheelchair 50 feet with 2 turns activity    Assist            Wheelchair 150 feet activity     Assist          Blood pressure 129/63, pulse 73, temperature 98.2 F (36.8 C), resp. rate 16, height 5\' 8"  (1.727 m), weight 71.6 kg, SpO2 96 %.    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             Continue CIR 2.  Impaired mobility, ambulating 200 feet -DVT/anticoagulation:  Pharmaceutical: d/c Lovenox             -antiplatelet therapy: DAPT X 3 weeks followed by ASA alone. 3. Pain Management: N/A 4. Mood: LCSW to follow for evaluation and support.             -antipsychotic agents: N/A 5. Neuropsych: This patient is not capable of making  decisions on her own behalf. 6. Skin/Wound Care:  Routine pressure relief measures. 7. Fluids/Electrolytes/Nutrition: Monitor I/O. Check lytes in am.  8. T2DM: Hgb A1C- 12.0. Will monitor BS ac/hs and titrate medications as indicated.             --on Lantus 10 and 3 units novolog for meal coverage.  Increase lantus to 12U. Change protein supplement to with meals.              --continue to reinforce DM education. 9. Dyslipidemia: Continue Lipitor. 10. B 12 deficiency: Level 97. Received 1000  mcg/IM on 06/20 and repeat weekly X 3 for supplementation. --continue oral supplement. 11. HTN: Monitor BP tid--well controlled, continue 5mg  Norvasc daily, well controlled 12. Severe vitamin D deficiency: 11- supplement with ergocalciferol 50,000U weekly for 7 weeks, repeat level outpatient.  13. AKI: encourage hydration. Repeat BMP on Monday  LOS: 2 days A FACE TO FACE EVALUATION WAS PERFORMED  Kamber Vignola 05/28/2021, 10:56 AM

## 2021-05-28 NOTE — Progress Notes (Signed)
Physical Therapy Session Note  Patient Details  Name: Laura [name] MRN: [id number] Date of Birth: 05-09-1951  Today's Date: 05/28/2021 PT Individual Time: 1340-1415 PT Individual Time Calculation (min): 35 min   Short Term Goals: Week 1:  PT Short Term Goal 1 (Week 1): STG = LTG due to ELOS  Skilled Therapeutic Interventions/Progress Updates:  Patient supine in bed on entrance to room. Patient alert and agreeable to PT session. Patient denied pain during session.  Therapeutic Activity: Bed Mobility: Patient performed supine <> sit with supervision/ Mod I. No vc required for technique.  Transfers: Patient performed STS and SPVT transfers throughout session with supervision. No vc required.  Gait Training:  Patient ambulated >250' x2 with no AD and close supervision/ CGA. Demonstrated slight variance in path. Provided vc/ tc for forward level gaze.  Neuromuscular Re-ed: NMR facilitated during session with focus on dynamic gait and balance. Pt guided in obstacle course including Airex pad, blue foam wedge, 6" hurdles and floor ladder. First pass of course with pause on Airex with self perturbations of arm movements, slow progression up wedge, hurdles, then ladder with one step per square out and back. No LOB or minor sway noted throughout.  Second pass with pt holding 2# weighted ball with bil hands near chest. Completed with no LOB. Third pass with ball held anteriorly at arms length proving more difficult especially with ladder. VC for taking step, adjusting balance and then shifting weight forward. Several minor wobbles of balance requiring stepping strategy to correct.  Ladder only performing with slow progression and 2-3sec holds in single leg stance with high knee flexion. Attempting to complete with increased speed with breakdown in performance and decreased ability to maintain balance. VC to adjust balance with each step as before and then shift weight forward to complete step. Pt demos  light fatigue.   NMR performed for improvements in motor control and coordination, balance, sequencing, judgement, and self confidence/ efficacy in performing all aspects of mobility at highest level of independence.   Patient supine in bed at end of session with brakes locked, bed alarm set, and all needs within reach.  Therapy Documentation Precautions:  Precautions Precautions: Fall Precaution Comments: R inattention Restrictions Weight Bearing Restrictions: No Therapy/Group: Individual Therapy  Loel Dubonnet PT, DPT 05/28/2021, 5:34 PM

## 2021-05-29 LAB — GLUCOSE, CAPILLARY
Glucose-Capillary: 193 mg/dL — ABNORMAL HIGH (ref 70–99)
Glucose-Capillary: 192 mg/dL — ABNORMAL HIGH (ref 70–99)
Glucose-Capillary: 270 mg/dL — ABNORMAL HIGH (ref 70–99)
Glucose-Capillary: 274 mg/dL — ABNORMAL HIGH (ref 70–99)

## 2021-05-29 NOTE — Progress Notes (Signed)
Speech Language Pathology Daily Session Note  Patient Details  Name: Nissa Stannard MRN: [id number] Date of Birth: 1951/04/02  Today's Date: 05/29/2021 SLP Individual Time: 9935-7017 SLP Individual Time Calculation (min): 43 min  Short Term Goals: Week 1: SLP Short Term Goal 1 (Week 1): STG=LTG (estimated LOS 5-7 days)  Skilled Therapeutic Interventions:Skilled ST services focused on language skills. SLP facilitated convergent, divergent and verbal error awareness skills. Pt demonstrated verbal error awareness in 60% of opportunities. Pt completed simple convergent naming tasks with supervision A sentence completion cues, however with mildly complex convergent naming pt demonstrated 80% accuracy with 60% sentence completion or semantic cues.  Pt was able to name 5 animals within 1 minute in divergent naming task, with no increase after teaching sub-categorization due to preservations on words. Pt was able to list 5 members of simple categories (ex: fruits...etc) with min fading to supervision semantic and sentence completion cues. Phonetic cues were not found to be effective in wording finding. Pt was left in room with call bell within reach and bed alarm set. SLP recommends to continue skilled services.     Pain Pain Assessment Pain Score: 0-No pain  Therapy/Group: Individual Therapy  Zalen Sequeira  [facility] 05/29/2021, 12:58 PM

## 2021-05-29 NOTE — IPOC Note (Signed)
Overall Plan of Care [facility]) Patient Details Name: Laura [name] MRN: [id number] DOB: 1951-08-02  Admitting Diagnosis: Stroke (cerebrum) [facility])  Hospital Problems: Principal Problem:   Stroke (cerebrum) (HCC)     Functional Problem List: Nursing Behavior, Endurance, Medication Management, Nutrition, Safety, Skin Integrity  PT Balance, Motor, Perception  OT Balance, Cognition, Safety, Perception, Motor  SLP Cognition, Safety, Linguistic, Motor  TR         Basic ADL's: OT Bathing, Dressing, Toileting     Advanced  ADL's: OT Light Housekeeping     Transfers: PT Bed Mobility, Bed to Chair, Customer service manager, Tub/Shower     Locomotion: PT Ambulation, Stairs     Additional Impairments: OT None  SLP Communication expression    TR      Anticipated Outcomes Item Anticipated Outcome  Self Feeding independent  Swallowing  N/A   Basic self-care  supervision  Toileting  supervision   Bathroom Transfers supervision  Bowel/Bladder  N/A  Transfers  supervision  Locomotion  supervision  Communication  supervision to minA basic expression  Cognition  supervisionA  Pain  No pain  Safety/Judgment  supervision and no falls   Therapy Plan: PT Intensity: Minimum of 1-2 x/day ,45 to 90 minutes PT Frequency: 5 out of 7 days PT Duration Estimated Length of Stay: 5-7 days OT Intensity: Minimum of 1-2 x/day, 45 to 90 minutes OT Frequency: 5 out of 7 days OT Duration/Estimated Length of Stay: 5-7 days SLP Intensity: Minumum of 1-2 x/day, 30 to 90 minutes SLP Frequency: 3 to 5 out of 7 days SLP Duration/Estimated Length of Stay: 5-7 days   Due to the current state of emergency, patients may not be receiving their 3-hours of Medicare-mandated therapy.   Team Interventions: Nursing Interventions Patient/Family Education, Disease Management/Prevention, Medication Management, Discharge Planning, Psychosocial Support  PT interventions Ambulation/gait training,  Warden/ranger, Cognitive remediation/compensation, Community reintegration, Discharge planning, Disease management/prevention, DME/adaptive equipment instruction, Functional mobility training, Pain management, Neuromuscular re-education, Patient/family education, Psychosocial support, Skin care/wound management, Stair training, Therapeutic Activities, Therapeutic Exercise, UE/LE Strength taining/ROM, UE/LE Coordination activities, Visual/perceptual remediation/compensation  OT Interventions Balance/vestibular training, Cognitive remediation/compensation, Discharge planning, DME/adaptive equipment instruction, Functional mobility training, Neuromuscular re-education, Psychosocial support, Patient/family education, Self Care/advanced ADL retraining, Therapeutic Activities, Therapeutic Exercise, UE/LE Strength taining/ROM, UE/LE Coordination activities  SLP Interventions Speech/Language facilitation, Cognitive remediation/compensation, Patient/family education, Therapeutic Activities, Cueing hierarchy  TR Interventions    SW/CM Interventions Discharge Planning, Psychosocial Support, Patient/Family Education   Barriers to Discharge MD  Medical stability  Nursing Decreased caregiver support, Home environment access/layout, New diabetic, Lack of/limited family support, Medication compliance, Behavior, Nutrition means 2 level home, able to live on main level, new diabetic.  PT Inaccessible home environment, Home environment access/layout, Decreased caregiver support, Insurance for SNF coverage    OT      SLP      SW Lack of/limited family support Unsure how long can provide assist-sister's   Team Discharge Planning: Destination: PT-Home ,OT- Home , SLP-Home Projected Follow-up: PT-Outpatient PT, 24 hour supervision/assistance, OT-  Home health OT, SLP-Outpatient SLP, 24 hour supervision/assistance Projected Equipment Needs: PT-To be determined, OT- To be determined, SLP-None recommended  by SLP Equipment Details: PT- , OT-need to discuss with pt's sister Patient/family involved in discharge planning: PT- Patient,  OT-Patient, SLP-Patient, Family member/caregiver  MD ELOS: 5-7 days Medical Rehab Prognosis:  Excellent Assessment: Laura [name] is a 70 year old woman admitted to CIR with acute nonhemorrhagic infarct of left caudate head and anterior lentiform  nucleus. Active medical issues include AKI, severe vitamin D deficiency, and uncontrolled type 2 DM. Medications are being managed, and labs and vitals are being monitored regularly.     See Team Conference Notes for weekly updates to the plan of care

## 2021-05-29 NOTE — Progress Notes (Signed)
Occupational Therapy Session Note  Patient Details  Name: Laura [name] MRN: [id number] Date of Birth: 05-09-1951  Today's Date: 05/29/2021 OT Individual Time: 1004-1100 OT Individual Time Calculation (min): 56 min    Short Term Goals: Week 1:  OT Short Term Goal 1 (Week 1): STGs = LTGs  Skilled Therapeutic Interventions/Progress Updates:     Pt received in bed agreeable to OT already completed ADL sfor the day and has no pain  Therapeutic activity Focus of session on functional mobility without device at community level distances/surfaces, dual task processing, pathfinding and trail making B test.  Pt completes ambulation to outside courtyard, crosses street (VC For safe looking both directions), sits on picnic table, park bench, walks in mulch, negotiates stairs with trail all with S-CGA for uneven terrain.   Multi task walking, bouncing ball and naming items in category with bouncing ball terminated every time pt needed to name item in category. Implicaitons discussed  Trail making B test completed 2x Standing on floor 4 min 15 sec no errors Standing on compliant surface: 2 min 45 sec- 4 errors  Pt left at end of session in bed with exit alarm on, call light in reach and all needs met   Therapy Documentation Precautions:  Precautions Precautions: Fall Precaution Comments: R inattention Restrictions Weight Bearing Restrictions: No General:   Vital Signs: Therapy Vitals Temp: 97.9 F (36.6 C) Pulse Rate: 80 Resp: 17 BP: 126/70 Patient Position (if appropriate): Lying Oxygen Therapy SpO2: 96 % O2 Device: Room Air Pain:   ADL: ADL Eating: Set up Grooming: Supervision/safety Where Assessed-Grooming: Standing at sink Upper Body Bathing: Supervision/safety Where Assessed-Upper Body Bathing: Shower Lower Body Bathing: Contact guard Where Assessed-Lower Body Bathing: Shower Upper Body Dressing: Setup Where Assessed-Upper Body Dressing: Edge of bed Lower Body  Dressing: Contact guard Where Assessed-Lower Body Dressing: Edge of bed Toileting: Contact guard Where Assessed-Toileting: Glass blower/designer: Psychiatric nurse Method: Counselling psychologist: Energy manager: Environmental education officer Method: Heritage manager: Civil engineer, contracting with back, Landscape architect   Exercises:   Other Treatments:     Therapy/Group: Individual Therapy  Tonny Branch 05/29/2021, 6:57 AM

## 2021-05-29 NOTE — Progress Notes (Signed)
Called pt sister (contact) Cammy Copa, agreed to moving pt to [phone].

## 2021-05-29 NOTE — Progress Notes (Signed)
Physical Therapy Session Note  Patient Details  Name: Laura [name] MRN: [id number] Date of Birth: May 05, 1951  Today's Date: 05/29/2021 PT Individual Time: 1400 - 1513, 73 min     Short Term Goals: Week 1:  PT Short Term Goal 1 (Week 1): STG = LTG due to ELOS  Skilled Therapeutic Interventions/Progress Updates:   Pt resting in bed.  She denied pain.  Supine> sit with difficulty and extra time, supervision.  Bed mobility training for supine> sit including rolling to L, knees to chest, feet off of bed, then pushing up with LUE, without difficulty.  Sit> stand with supervision. Gait in room and to BR, CGA to toilket, for continent voiding.  Hand wahsing at sink in standing, supervision.  Gait to therapy room without AD, CGA.  neuromuscular re-education RLE via demo, forced use for R step ups onto 1" high balance beam, x 12' forward and backward, iwht min/mod assist for balance, seated alternating R/L toes/heels up, 20 x 1 bil scapular retraction.  Balance challenges with heel-toe stepping on same balance beam, and side stepping R/L on balance beam, with min assist for balance.  Pt tends to lean R.  Biased to R standing during dual task of folding linens with bil hands, x 2 minutes x 2.  Advanced gait training transporting slide board (as a tray) with both hands, balancing cups without spillage, x 150', holding board away from body, pt had immediate spillage due to tilting of board forward.  At end of session, pt carried laundry basket x 250' to new room, 5C08, CGa without LOB or tripping.  Family present.  Sit> supine with supervision.  Bed alarm set and needs left at hand.     Therapy Documentation Precautions:  Precautions Precautions: Fall Precaution Comments: R inattention Restrictions Weight Bearing Restrictions: No      Therapy/Group: Individual Therapy  [name] 05/29/2021, 12:26 PM

## 2021-05-30 LAB — GLUCOSE, CAPILLARY
Glucose-Capillary: 272 mg/dL — ABNORMAL HIGH (ref 70–99)
Glucose-Capillary: 215 mg/dL — ABNORMAL HIGH (ref 70–99)
Glucose-Capillary: 164 mg/dL — ABNORMAL HIGH (ref 70–99)
Glucose-Capillary: 234 mg/dL — ABNORMAL HIGH (ref 70–99)

## 2021-05-30 MED ORDER — INSULIN GLARGINE 100 UNIT/ML ~~LOC~~ SOLN
13.0000 [IU] | Freq: Every day | SUBCUTANEOUS | Status: DC
  Administered 2021-05-31 – 2021-06-01 (×2): 13 [IU] via SUBCUTANEOUS
  Filled 2021-05-30 (×2): qty 0.13

## 2021-05-30 NOTE — Progress Notes (Signed)
PROGRESS NOTE   Subjective/Complaints: No complaints Working very well with therapy, commended on her progress Vitals stable  ROS: denies pain, constipation, insomnia   Objective:   No results found. No results for input(s): WBC, HGB, HCT, PLT in the last 72 hours.  No results for input(s): NA, K, CL, CO2, GLUCOSE, BUN, CREATININE, CALCIUM in the last 72 hours.   Intake/Output Summary (Last 24 hours) at 05/30/2021 1126 Last data filed at 05/30/2021 0732 Gross per 24 hour  Intake 894 ml  Output --  Net 894 ml        Physical Exam: Vital Signs Blood pressure 129/64, pulse 76, temperature 98.2 F (36.8 C), temperature source Oral, resp. rate 18, height 5\' 8"  (1.727 m), weight 71.6 kg, SpO2 96 %. Gen: no distress, normal appearing HEENT: oral mucosa pink and moist, NCAT Cardio: Reg rate Chest: normal effort, normal rate of breathing Abd: soft, non-distended Ext: no edema Psych: pleasant, normal affect Skin: intact Neuro: Alert and oriented x3 (off 1 day with date). Makes good eye contact. +facial droop. 4/5 right sided grip strength. Strength is otherwise intact. Sensation intact. Mild dysarthria, word finding difficulty, slowed speech. Moderate expressive aphasia.    Assessment/Plan: 1. Functional deficits which require 3+ hours per day of interdisciplinary therapy in a comprehensive inpatient rehab setting. Physiatrist is providing close team supervision and 24 hour management of active medical problems listed below. Physiatrist and rehab team continue to assess barriers to discharge/monitor patient progress toward functional and medical goals  Care Tool:  Bathing    Body parts bathed by patient: Right arm, Left arm, Abdomen, Chest, Front perineal area, Buttocks, Face, Left lower leg, Right lower leg, Left upper leg, Right upper leg         Bathing assist Assist Level: Contact Guard/Touching assist      Upper Body Dressing/Undressing Upper body dressing   What is the patient wearing?: Pull over shirt    Upper body assist Assist Level: Set up assist    Lower Body Dressing/Undressing Lower body dressing      What is the patient wearing?: Underwear/pull up, Pants     Lower body assist Assist for lower body dressing: Minimal Assistance - Patient > 75%     Toileting Toileting    Toileting assist Assist for toileting: Contact Guard/Touching assist     Transfers Chair/bed transfer  Transfers assist     Chair/bed transfer assist level: Supervision/Verbal cueing     Locomotion Ambulation   Ambulation assist      Assist level: Supervision/Verbal cueing Assistive device: No Device Max distance: 250'   Walk 10 feet activity   Assist     Assist level: Supervision/Verbal cueing Assistive device: No Device   Walk 50 feet activity   Assist    Assist level: Supervision/Verbal cueing Assistive device: No Device    Walk 150 feet activity   Assist    Assist level: Supervision/Verbal cueing Assistive device: No Device    Walk 10 feet on uneven surface  activity   Assist     Assist level: Contact Guard/Touching assist Assistive device: Other (comment) (none)   Wheelchair     Assist  Wheelchair 50 feet with 2 turns activity    Assist            Wheelchair 150 feet activity     Assist          Blood pressure 129/64, pulse 76, temperature 98.2 F (36.8 C), temperature source Oral, resp. rate 18, height 5\' 8"  (1.727 m), weight 71.6 kg, SpO2 96 %.    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             Continue CIR 2.  Impaired mobility, ambulating 200 feet: d/c Lovenox             -antiplatelet therapy: DAPT X 3  weeks followed by ASA alone. 3. Pain Management: N/A 4. Mood: LCSW to follow for evaluation and support.             -antipsychotic agents: N/A 5. Neuropsych: This patient is not capable of making decisions on her own behalf. 6. Skin/Wound Care:  Routine pressure relief measures. 7. Fluids/Electrolytes/Nutrition: Monitor I/O. Check lytes in am.  8. T2DM: Hgb A1C- 12.0. Will monitor BS ac/hs and titrate medications as indicated.             --on Lantus 10 and 3 units novolog for meal coverage.  increase lantus to 13U. Change protein supplement to with meals.              --continue to reinforce DM education. 9. Dyslipidemia: Continue Lipitor. 10. B 12 deficiency: Level 97. Received 1000  mcg/IM on 06/20 and repeat weekly X 3 for supplementation. --continue oral supplement. 11. HTN: Monitor BP tid--well controlled, continue 5mg  Norvasc daily, well controlled 12. Severe vitamin D deficiency: 11- supplement with ergocalciferol 50,000U weekly for 7 weeks, repeat level outpatient.  13. AKI: encourage hydration. Repeat BMP on Monday  LOS: 4 days A FACE TO FACE EVALUATION WAS PERFORMED  P Griffin Dewilde 05/30/2021, 11:26 AM

## 2021-05-30 NOTE — Plan of Care (Signed)
  Problem: Consults Goal: RH STROKE PATIENT EDUCATION Description: See Patient Education module for education specifics  Outcome: Progressing Goal: Diabetes Guidelines if Diabetic/Glucose > 140 Description: If diabetic or lab glucose is > 140 mg/dl - Initiate Diabetes/Hyperglycemia Guidelines & Document Interventions  Outcome: Progressing   Problem: RH SAFETY Goal: RH STG ADHERE TO SAFETY PRECAUTIONS W/ASSISTANCE/DEVICE Description: STG Adhere to Safety Precautions With Supervision Assistance/Device. Outcome: Progressing   Problem: RH COGNITION-NURSING Goal: RH STG ANTICIPATES NEEDS/CALLS FOR ASSIST W/ASSIST/CUES Description: STG Anticipates Needs/Calls for Assist With cues and reminders. Outcome: Progressing   Problem: RH KNOWLEDGE DEFICIT Goal: RH STG INCREASE KNOWLEDGE OF DIABETES Description: Patient will be able to demonstrate knowledge of diabetes medications, dietary management, and insulin administration with educational materials and handouts provided by staff independently at discharge. Outcome: Progressing Goal: RH STG INCREASE KNOWLEDGE OF HYPERTENSION Description: Patient will be able to demonstrate knowledge of HTN medications, dietary management, BP and HR parameters with educational materials and handouts provided by staff independently by discharge. Outcome: Progressing Goal: RH STG INCREASE KNOWLEGDE OF HYPERLIPIDEMIA Description: Patient will demonstrate knowledge of HLD medications, lab values, and dietary management with educational materials and handouts provided by staff independently at discharge. Outcome: Progressing Goal: RH STG INCREASE KNOWLEDGE OF STROKE PROPHYLAXIS Description: Patient will be able to demonstrate knowledge of medications used to prevent future stokes with educational materials and handouts provided by staff independently at discharge. Outcome: Progressing

## 2021-05-30 NOTE — Progress Notes (Addendum)
Physical Therapy Session Note  Patient Details  Name: Laura [name] MRN: [id number] Date of Birth: [birth year]  Today's Date: 05/30/2021 PT Individual Time: 0905-1005 PT Individual Time Calculation (min): 60 min   Short Term Goals: Week 1:  PT Short Term Goal 1 (Week 1): STG = LTG due to ELOS  Skilled Therapeutic Interventions/Progress Updates:    Pt resting in bed.  She denied pain.  Sit><supine with supervision, remembering technique from yesterday's session for ease of movement. Pt donned shoes sitting EOB, supervision.  From standing, pt retrieved a napkin she had dropped, with CGA.  Sit> stand iwht supervision.  Gait training for community distances over level tile, elevators, sloping sidewalks, lumpy grass, up/down curbs.  Close supervision gait over level tile , CGA over other surfaces; mod cues for forward gaze for safety.  No LOB, but gait mildly unsteady and lacks trunk rotation, arm swing and visual tracking of environment for safety.  Pt able to increase velocity minimally for 10' when requested.  Pt needed mod cues for route finding from 4th><5th floors, and to find rooms she is familiar with.  She needed max multimodal cues to use elevators from floor to floor.  Gait up/down 12 steps 2 rails with close supervision, using self selected step through method.  Therapeutic activity in unsupported sitting to facilitate trunk rotation and trunk lengthening/shortening during gait: reaching across midline to retrieve and replace pegs with L/R hands while sitting with R/L LE crossed at ankles.    neuromuscular re-education via demo, multimodal cues for : seated -bil scapular retraction to facilitate upright trunk and forward gaze.  Self stretching R hamstrings and heel cords x 30 seconds x 3.    At end of session, pt resting in bed with alarm set and needs at hand.  Therapy Documentation Precautions:  Precautions Precautions: Fall Precaution Comments: R inattention Restrictions Weight  Bearing Restrictions: No      Therapy/Group: Individual Therapy  Jiah Bari 05/30/2021, 10:17 AM

## 2021-05-30 NOTE — Progress Notes (Signed)
Occupational Therapy Session Note  Patient Details  Name: Laura [name] MRN: [id number] Date of Birth: 1951-05-07  Today's Date: 05/30/2021 OT Individual Time: 1105-1200 OT Individual Time Calculation (min): 55 min   Short Term Goals: Week 1:  OT Short Term Goal 1 (Week 1): STGs = LTGs  Skilled Therapeutic Interventions/Progress Updates:    Pt greeted in bed with no c/o pain. Agreeable to shower. She completed toileting (+bladder void, using standard toilet), bathing (sitting in shower using 3:1), dressing (EOB at sit<stand level without AD), and blow-drying hair + oral care (standing at the sink) during session. CGA for ambulatory transfers during session with exception for shower transfers which pt required Min A for due to ledge. Supervision/cues for sequencing during shower and also using figure 4 position to safely wash her feet. CGA for dynamic standing balance during dressing tasks and close supervision for balance assistance during sinkside activity. Placed clothing items on her Rt side to promote scanning during dressing. Pt using her Rt hand to manage hair dryer. At end of session pt returned to bed, left her with all needs within reach and bed alarm set. Tx focus placed on ADL retraining, dynamic balance, functional cognition, and Rt attention.   Pt A&Ox4 during tx  Therapy Documentation Precautions:  Precautions Precautions: Fall Precaution Comments: R inattention Restrictions Weight Bearing Restrictions: No  ADL: ADL Eating: Set up Grooming: Supervision/safety Where Assessed-Grooming: Standing at sink Upper Body Bathing: Supervision/safety Where Assessed-Upper Body Bathing: Shower Lower Body Bathing: Contact guard Where Assessed-Lower Body Bathing: Shower Upper Body Dressing: Setup Where Assessed-Upper Body Dressing: Edge of bed Lower Body Dressing: Contact guard Where Assessed-Lower Body Dressing: Edge of bed Toileting: Contact guard Where Assessed-Toileting:  Teacher, adult education: Curator Method: Proofreader: Engineer, manufacturing systems Transfer: Insurance underwriter Method: Designer, industrial/product: Shower seat with back, Grab bars     Therapy/Group: Individual Therapy  Trev Boley A Kejuan Bekker 05/30/2021, 12:24 PM

## 2021-05-31 DIAGNOSIS — E119 Type 2 diabetes mellitus without complications: Secondary | ICD-10-CM

## 2021-05-31 DIAGNOSIS — I10 Essential (primary) hypertension: Secondary | ICD-10-CM

## 2021-05-31 LAB — CBC
HCT: 40.3 % (ref 36.0–46.0)
Hemoglobin: 13.4 g/dL (ref 12.0–15.0)
MCH: 28.6 pg (ref 26.0–34.0)
MCHC: 33.3 g/dL (ref 30.0–36.0)
MCV: 86.1 fL (ref 80.0–100.0)
Platelets: 293 10*3/uL (ref 150–400)
RBC: 4.68 MIL/uL (ref 3.87–5.11)
RDW: 12 % (ref 11.5–15.5)
WBC: 6.9 10*3/uL (ref 4.0–10.5)
nRBC: 0 % (ref 0.0–0.2)

## 2021-05-31 LAB — BASIC METABOLIC PANEL
Anion gap: 7 (ref 5–15)
BUN: 19 mg/dL (ref 8–23)
CO2: 27 mmol/L (ref 22–32)
Calcium: 9.3 mg/dL (ref 8.9–10.3)
Chloride: 102 mmol/L (ref 98–111)
Creatinine, Ser: 0.93 mg/dL (ref 0.44–1.00)
GFR, Estimated: >60 mL/min (ref >60)
Glucose, Bld: 223 mg/dL — ABNORMAL HIGH (ref 70–99)
Potassium: 4.3 mmol/L (ref 3.5–5.1)
Sodium: 136 mmol/L (ref 135–145)

## 2021-05-31 LAB — GLUCOSE, CAPILLARY
Glucose-Capillary: 233 mg/dL — ABNORMAL HIGH (ref 70–99)
Glucose-Capillary: 195 mg/dL — ABNORMAL HIGH (ref 70–99)
Glucose-Capillary: 184 mg/dL — ABNORMAL HIGH (ref 70–99)
Glucose-Capillary: 209 mg/dL — ABNORMAL HIGH (ref 70–99)

## 2021-05-31 NOTE — Progress Notes (Signed)
Speech Language Pathology Daily Session Note  Patient Details  Name: Laura [name] MRN: [id number] Date of Birth: [birth year]  Today's Date: 05/31/2021 SLP Individual Time: 1415-1505 SLP Individual Time Calculation (min): 50 min  Short Term Goals: Week 1: SLP Short Term Goal 1 (Week 1): STG=LTG (estimated LOS 5-7 days)  Skilled Therapeutic Interventions: Skilled ST treatment performed with focus on language goals. SLP facilitated convergent naming task with 90% accuracy and supervision A semantic and occasional phonemic cues. Facilitated semantic feature analysis involving describing various common objects found within patient's environment with Mod A semantic cues fading to Min A verbal cues. Patient exhibited mild word finding difficulty during both structured tasks and conversation as evidenced by word searching behaviors, phonemic paraphasias, and neologisms. Patient appeared aware of errors during 75% of occasions. SLP facilitated use of semantic features to overcome word finding difficulty which was effective with min-to-mod a verbal cues. Patient was left in bed with alarm activated and needs within reach. Continue per ST POC.    Pain Pain Assessment Pain Scale: 0-10 Pain Score: 0-No pain  Therapy/Group: Individual Therapy  Tamala Ser 05/31/2021, 4:10 PM

## 2021-05-31 NOTE — Progress Notes (Signed)
Occupational Therapy Session Note  Patient Details  Name: Laura [name] MRN: [id number] Date of Birth: 1951/08/25  Today's Date: 05/31/2021 OT Individual Time: 1122-1201 OT Individual Time Calculation (min): 39 min    Short Term Goals: Week 1:  OT Short Term Goal 1 (Week 1): STGs = LTGs  Skilled Therapeutic Interventions/Progress Updates:    Pt received supine in bed, agreeable to OT, reporting no pain. Pt requesting to go outside, session focused on navigating variety of outdoor terrain during functional ambulation, home safety, problem solving, functional cognition, and community reintegration. All bed mobility spvsn; sit<>stand and functional ambulation without AD completed with spvsn-CGA on uneven surfaces for balance. Pt donned shoes sitting EOB with spvsn. Pt navigated hallways and required mod vc's to follow directions to exit hospital. Pt walked up/down hills, up/down 15 stairs using handrail, over grass, mulch, and wooded area with debris. Min vc's for not running into bushes on R side. Occasional CGA required for safety, one LOB stepping off curb but self-recovered. Seated outside at table for rest break, pt answered home safety questions accurately demoing good awareness for emergencies (grease fire, using fire extinguisher, calling 911); education on potential AD for fall safety. Pt required min A to find correct words due to expressive aphasia, but minor frustration did not limit participation. Pt navigated safely to giftshop and engaged in scavenger hunt/executive function task, finding requested items with no cues, locating their price, and completing simple functional math problems. Pt perseverated slightly on one situational math problem and required mod vc's/redirection to begin next task. Pt ambulated back to room, found room with no vc's, and remained supine in bed, alarm set, all needs met.  Therapy Documentation Precautions:  Precautions Precautions: Fall Precaution Comments:  R inattention Restrictions Weight Bearing Restrictions: No  Pain: Pain Assessment Pain Scale: 0-10 Pain Score: 0-No pain   Therapy/Group: Individual Therapy  Mellissa Kohut 05/31/2021, 3:40 PM

## 2021-05-31 NOTE — Progress Notes (Signed)
PROGRESS NOTE   Subjective/Complaints: Sitting comfortably in bed. No complaints  ROS: Patient denies fever, rash, sore throat, blurred vision, nausea, vomiting, diarrhea, cough, shortness of breath or chest pain, joint or back pain, headache, or mood change.    Objective:   No results found. Recent Labs    05/31/21 0613  WBC 6.9  HGB 13.4  HCT 40.3  PLT 293    Recent Labs    05/31/21 0613  NA 136  K 4.3  CL 102  CO2 27  GLUCOSE 223*  BUN 19  CREATININE 0.93  CALCIUM 9.3     Intake/Output Summary (Last 24 hours) at 05/31/2021 1023 Last data filed at 05/31/2021 0718 Gross per 24 hour  Intake 658 ml  Output --  Net 658 ml        Physical Exam: Vital Signs Blood pressure 136/70, pulse 90, temperature 98.6 F (37 C), resp. rate 18, height 5\' 8"  (1.727 m), weight 71.6 kg, SpO2 98 %. Constitutional: No distress . Vital signs reviewed. HEENT: EOMI, oral membranes moist Neck: supple Cardiovascular: RRR without murmur. No JVD    Respiratory/Chest: CTA Bilaterally without wheezes or rales. Normal effort    GI/Abdomen: BS +, non-tender, non-distended Ext: no clubbing, cyanosis, or edema Psych: pleasant and cooperative Skin: intact Neuro: Alert, right facial droop. 4 to 4+/5 right sided grip strength. Strength is otherwise intact. Sensation intact. Mild dysarthria and word finding deficits. slowed speech.  .    Assessment/Plan: 1. Functional deficits which require 3+ hours per day of interdisciplinary therapy in a comprehensive inpatient rehab setting. Physiatrist is providing close team supervision and 24 hour management of active medical problems listed below. Physiatrist and rehab team continue to assess barriers to discharge/monitor patient progress toward functional and medical goals  Care Tool:  Bathing    Body parts bathed by patient: Right arm, Left arm, Abdomen, Chest, Front perineal area,  Buttocks, Face, Left lower leg, Right lower leg, Left upper leg, Right upper leg         Bathing assist Assist Level: Supervision/Verbal cueing (seated in shower)     Upper Body Dressing/Undressing Upper body dressing   What is the patient wearing?: Pull over shirt    Upper body assist Assist Level: Set up assist    Lower Body Dressing/Undressing Lower body dressing      What is the patient wearing?: Underwear/pull up, Pants     Lower body assist Assist for lower body dressing: Contact Guard/Touching assist     Toileting Toileting    Toileting assist Assist for toileting: Contact Guard/Touching assist     Transfers Chair/bed transfer  Transfers assist     Chair/bed transfer assist level: Supervision/Verbal cueing     Locomotion Ambulation   Ambulation assist      Assist level: Supervision/Verbal cueing Assistive device: No Device Max distance: 250'   Walk 10 feet activity   Assist     Assist level: Supervision/Verbal cueing Assistive device: No Device   Walk 50 feet activity   Assist    Assist level: Supervision/Verbal cueing Assistive device: No Device    Walk 150 feet activity   Assist    Assist level:  Supervision/Verbal cueing Assistive device: No Device    Walk 10 feet on uneven surface  activity   Assist     Assist level: Contact Guard/Touching assist Assistive device: Other (comment) (none)   Wheelchair     Assist               Wheelchair 50 feet with 2 turns activity    Assist            Wheelchair 150 feet activity     Assist          Blood pressure 136/70, pulse 90, temperature 98.6 F (37 C), resp. rate 18, height 5\' 8"  (1.727 m), weight 71.6 kg, SpO2 98 %.    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             -Continue CIR therapies including PT, OT, and SLP  2.  Impaired mobility, ambulating 200 feet: d/c Lovenox             -antiplatelet therapy: DAPT X 3 weeks followed by ASA alone. 3. Pain Management: N/A 4. Mood: LCSW to follow for evaluation and support.             -antipsychotic agents: N/A 5. Neuropsych: This patient is not capable of making decisions on her own behalf. 6. Skin/Wound Care:  Routine pressure relief measures. 7. Fluids/Electrolytes/Nutrition: Monitor I/O.   I personally reviewed all of the patient's labs today, and lab work is within normal limits.  8. T2DM: Hgb A1C- 12.0. Will monitor BS ac/hs and titrate medications as indicated.             --on Lantus 10 and 3 units novolog for meal coverage.  increased lantus to 13U effective this morning -observe for trend today -protein supplement with meals.              --continue to reinforce DM education. 9. Dyslipidemia: Continue Lipitor. 10. B 12 deficiency: Level 97. Received 1000  mcg/IM on 06/20 and repeat weekly X 3 for supplementation. --continue oral supplement. 11. HTN: Monitor BP tid--well controlled  6/27. continue 5mg  Norvasc daily, well controlled 12. Severe vitamin D deficiency: 11- supplement with ergocalciferol 50,000U weekly for 7 weeks, repeat level outpatient.  13. AKI: encourage hydration. Resolved 6/27  LOS: 5 days A FACE TO FACE EVALUATION WAS PERFORMED  05/31/2021, 10:23 AM

## 2021-05-31 NOTE — Progress Notes (Signed)
Occupational Therapy Session Note  Patient Details  Name: Laura [name] MRN: [id number] Date of Birth: Apr 07, 1951  Today's Date: 05/31/2021 OT Individual Time: 1310-1409 OT Individual Time Calculation (min): 59 min    Short Term Goals: Week 1:  OT Short Term Goal 1 (Week 1): STGs = LTGs   Skilled Therapeutic Interventions/Progress Updates:    Pt semi upright in bed, no c/o pain.  Pt reports she doesn't know where she will be going once discharged from CIR.  Called pt's sister with pt present and discussed with both at length regarding therapy discharge recommendations and further discharge planning.  Pt's sister supportive and reports able to provide needed 24/7 supervision at discharge.  She also reports her home is one story and has a walk in shower with shower chair available.  Pt initially reluctant, however after discussion and OT education on pts current functional status, pt agreeable to discharge plan of going to sisters house with 24/7 supervision.  Pt ambulated approximately 300 feet using elevator to lower level floor with CGA without AD.  Pt completed tub transfer with CGA using grab bars x 3 trials.  Pt ambulated back to room using elevator needing Vcs to attend to right side when elevator door opened on pts right side and to avoid obstacles on right with one occasion of pt lightly bumping into wall requiring multimodal cues to correct.  Pt returned to bed level, call bell in reach, bed alarm on.  Therapy Documentation Precautions:  Precautions Precautions: Fall Precaution Comments: R inattention Restrictions Weight Bearing Restrictions: No    Therapy/Group: Individual Therapy  Amie Critchley 05/31/2021, 3:21 PM

## 2021-05-31 NOTE — Progress Notes (Signed)
Patient ID: Laura [name], female   DOB: [birth year], 71 y.o.   MRN: [id number] Spoke with patricia-sister via telephone to inform team and MD feel she will be ready for discharge 6/29. Her plan is for pt to be supervised by her sister's. Discussed OP therapies have faxed to [facility] for them to contact sister to set up follow up appointments. Have found PCP in [facility] Medical appointment is 6/29 @ 3:00 pm

## 2021-05-31 NOTE — Progress Notes (Signed)
Physical Therapy Session Note  Patient Details  Name: Laura [name] MRN: [id number] Date of Birth: 1951-06-12  Today's Date: 05/31/2021 PT Individual Time: 1000-1100 PT Individual Time Calculation (min): 60 min   Short Term Goals: Week 1:  PT Short Term Goal 1 (Week 1): STG = LTG due to ELOS  Skilled Therapeutic Interventions/Progress Updates:    Pt greeted supine in bed at start of session and agreeable to therapy - reports mild muscle soreness from yesterday's therapy, declined pain intervention. Supine<>sit completed mod I with bed features. Donned tennis shoes with setupA while seated EOB. She reports need to use toilet - sit<>stand with supervision and no AD and ambulates to bathroom, ~78f, with CGA and no AD. Able to manage lower body clothes in standing with supervision and pt continent of bladder and small BM. Able to perform perciare without assist and pull pants up in standing with supervision. Ambulates sinkside with CGA and no AD where she performs hand hygiene with supervision for balance. Next, she ambulated ~3051f(with standing rest break in the elevator) downstars to 54M rehab gym. Completes 10 minutes of Nustep with workload of 6, using BLE's only, for purposes of general strengthening and improving cardiovascular endurance - pt instructed to maintain ~40 steps/minute cadence which she was able to do. Next, worked on dynamic standing balance with alternating toe taps to cones while unsupported, requiring minA guard - noted increased difficulty with coordination of R foot during taps and cues for controlled movement needed. Next, she ambulated up/down sloped ramp with CGA and no AD and then in/around mulch pit with CGA. Worked on cuWater engineerhere she went up/down curbs with unilateral support to hand rail with CGA, x15 reps total. Introduced single point cane to allow improvement from CGA to supervision however pt had difficulty with sequencing and cane technique, ultimately more  distracting than helpful, ambulated [id number] CGA and SPC. Ambulated back upstairs to her room with CGA and no AD (standing rest break in the elevator), and completed bed mobility mod I. She ended session supine with all needs met and bed alarm on. Noted increased unsteadiness this session compared to previous session, pt contributes this to being "groggy."  Therapy Documentation Precautions:  Precautions Precautions: Fall Precaution Comments: R inattention Restrictions Weight Bearing Restrictions: No General:    Therapy/Group: Individual Therapy  Gaetano Romberger P [place] 05/31/2021, 7:45 AM

## 2021-06-01 LAB — GLUCOSE, CAPILLARY
Glucose-Capillary: 216 mg/dL — ABNORMAL HIGH (ref 70–99)
Glucose-Capillary: 212 mg/dL — ABNORMAL HIGH (ref 70–99)
Glucose-Capillary: 209 mg/dL — ABNORMAL HIGH (ref 70–99)
Glucose-Capillary: 247 mg/dL — ABNORMAL HIGH (ref 70–99)

## 2021-06-01 MED ORDER — INSULIN GLARGINE 100 UNIT/ML ~~LOC~~ SOLN: 14.0000 [IU] | Freq: Every day | SUBCUTANEOUS | Status: DC

## 2021-06-01 MED ORDER — INSULIN GLARGINE 100 UNIT/ML ~~LOC~~ SOLN
18.0000 [IU] | Freq: Every day | SUBCUTANEOUS | Status: DC
  Administered 2021-06-02: 18 [IU] via SUBCUTANEOUS
  Filled 2021-06-01: qty 0.18

## 2021-06-01 MED ORDER — INSULIN GLARGINE 100 UNIT/ML ~~LOC~~ SOLN
5.0000 [IU] | Freq: Once | SUBCUTANEOUS | Status: AC
  Administered 2021-06-01: 5 [IU] via SUBCUTANEOUS
  Filled 2021-06-01: qty 0.05

## 2021-06-01 NOTE — Progress Notes (Signed)
Physical Therapy Session Note  Patient Details  Name: Laura [name] MRN: [id number] Date of Birth: [birth year]  Today's Date: 06/01/2021 PT Individual Time: Session1: 0175-1025; Session2: 1130-1200 PT Individual Time Calculation (min): 55 min & 30 min  Short Term Goals: Week 1:  PT Short Term Goal 1 (Week 1): STG = LTG due to ELOS  Skilled Therapeutic Interventions/Progress Updates:    Session1: Patient in supine with sister in the room who reports she will be going home with her and will have assistance.  Patient denies pain, but seems fatigued.  Performed supine to sit with S and donned shoes at EOB with S.  Patient sit to stand throughout session with S and ambulated without device with S x 230' x 2.  Patient demonstrates decreased R arm swing and flexed cervical and upper thoracic posture she reports can't really fix much despite cues due to postural changes.  Patient performed Berg balance assessment as noted below with score 47/56 and TUG 13.7 over three trials.  Reviewed fall risk reduction information and pt given handout.  Also demonstrated and pt able to perform floor transfer with min A.  Discussed situations when appropriate to recover from a fall and when to call for 911.  Information also reviewed with her sister, [name], in the room.  Patient negotiated 4 steps with rails and S x 3 reps (12 total) performing with step over step technique.  Patient back to room with S.  Left with sister in the room with bed alarm active and call bell in reach.  Session2:  Patient in supine and waking from a nap.  Supine to sit and sit to stand with S.  Ambulated over 300' over level indoor and grassy outdoor surfaces with CGA over grass and up/down curb step.  Patient needing seated rest then ambulated back to unit and in ADL apartment able to ambulate on carpet and transition to tile surfaces without LOB of difficulty.  Patient able to retrieve cup from cupbord and fill with water and carry to table.   Performed transfer to chair at table, then able to return cup to sink.  Patient ambulated to room x 200' and returned to sitting EOB with bed alarm active and needs in reach, sister in the room.   Therapy Documentation Precautions:  Precautions Precautions: Fall Precaution Comments: Mild R inattention. Aphasia Restrictions Weight Bearing Restrictions: No  Pain: Pain Assessment Pain Scale: 0-10 Pain Score: 0-No pain  Balance: Balance Balance Assessed: Yes Standardized Balance Assessment Standardized Balance Assessment: Berg Balance Test Berg Balance Test Sit to Stand: Able to stand without using hands and stabilize independently Standing Unsupported: Able to stand safely 2 minutes Sitting with Back Unsupported but Feet Supported on Floor or Stool: Able to sit safely and securely 2 minutes Stand to Sit: Sits safely with minimal use of hands Transfers: Able to transfer safely, minor use of hands Standing Unsupported with Eyes Closed: Able to stand 10 seconds safely Standing Ubsupported with Feet Together: Able to place feet together independently and stand 1 minute safely From Standing, Reach Forward with Outstretched Arm: Can reach confidently >25 cm (10") From Standing Position, Pick up Object from Floor: Able to pick up shoe, needs supervision From Standing Position, Turn to Look Behind Over each Shoulder: Looks behind from both sides and weight shifts well Turn 360 Degrees: Able to turn 360 degrees safely but slowly Standing Unsupported, Alternately Place Feet on Step/Stool: Able to complete 4 steps without aid or supervision Standing Unsupported, One Foot  in Front: Able to take small step independently and hold 30 seconds Standing on One Leg: Able to lift leg independently and hold equal to or more than 3 seconds Total Score: 47 Timed Up and Go Test TUG: Normal TUG Normal TUG (seconds): 13.7 (average of 3 trials 14.9, 12.9, 13.3)    Therapy/Group: Individual  Therapy  Elray Mcgregor [place], PT 06/01/2021, 10:18 AM

## 2021-06-01 NOTE — Patient Care Conference (Signed)
Inpatient RehabilitationTeam Conference and Plan of Care Update Date: 06/01/2021   Time: 13:26 PM    Patient Name: Laura [name]      Medical Record Number: [id number]  Date of Birth: 1951-02-11 Sex: Female         Room/Bed: 5C08C/5C08C-01 Payor Info: Payor: MEDICARE / Plan: MEDICARE PART A AND B / Product Type: *No Product type* /    Admit Date/Time:  05/26/2021  1:27 PM  Primary Diagnosis:  Stroke (cerebrum) [facility])  Hospital Problems: Principal Problem:   Stroke (cerebrum) [facility])    Expected Discharge Date: Expected Discharge Date: 06/02/21  Team Members Present: Physician leading conference: Dr. Sula Soda Care Coodinator Present: Dossie Der, LCSW;Glade Strausser Lambert Mody, RN, BSN, CRRN Nurse Present: Chana Bode, RN PT Present: Wynelle Link, PT OT Present: Dolphus Jenny, OT SLP Present: Colin Benton, SLP PPS Coordinator present : Fae Pippin, SLP     Current Status/Progress Goal Weekly Team Focus  Bowel/Bladder             Swallow/Nutrition/ Hydration             ADL's             Mobility   Supervision overall. gait >548ft with no AD, 12 stairs with supervision and hand rails  supervision  Pt education, DC planning, safety awareness, funtional mobility, higher level balance training   Communication   Min A  Min A  word finding   Safety/Cognition/ Behavioral Observations  Min A-Supervision  Supervision  short-term recall, anticipatory awareness   Pain             Skin               Discharge Planning:  HOme to sister's home where they can provide supervision level, set up OP therpaies and PCP. Sister here today for therapies   Team Discussion: Blood pressure controlled. Vit D supplement. Ready for discharge Patient on target to meet rehab goals: yes, currently mod I - supervision overall. Requires supervision for long distance gait. Supervision for self care and cues to attend to affected side.  *See Care Plan and progress notes for long and  short-term goals.   Revisions to Treatment Plan:  Practice on uneven surfaces, and long distance gait  Working on error awareness, word finding, structured tasks  Teaching Needs: Toileting, medications, secondary stroke risk management, etc  Current Barriers to Discharge: Decreased caregiver support and New diabetic  Possible Resolutions to Barriers: Going home with sister Family education OP follow up services scheduled SW facilitated access to PCP     Medical Summary Current Status: uncontrolled type 2 DM, BP well controlled, AKI, severe vitamin D deficiency  Barriers to Discharge: Medical stability;New diabetic  Barriers to Discharge Comments: uncontrolled type 2 DM, HTN, AKI, severe vitamin D deficiency Possible Resolutions to [employer] Focus: increase Lantus to 20U, stop Novolog, insulin education, continue anti-hypertensives, continue to encourage hydration, continue high dose vitamin D supplementation   Continued Need for Acute Rehabilitation Level of Care: The patient requires daily medical management by a physician with specialized training in physical medicine and rehabilitation for the following reasons: Direction of a multidisciplinary physical rehabilitation program to maximize functional independence : Yes Medical management of patient stability for increased activity during participation in an intensive rehabilitation regime.: Yes Analysis of laboratory values and/or radiology reports with any subsequent need for medication adjustment and/or medical intervention. : Yes   I attest that I was present, lead the team conference, and concur with the  assessment and plan of the team.   Pamelia Hoit 06/01/2021, 5:12 PM

## 2021-06-01 NOTE — Progress Notes (Signed)
Speech Language Pathology Discharge Summary  Patient Details  Name: Laura [name] MRN: [id number] Date of Birth: September 05, 1951  Today's Date: 06/01/2021 SLP Individual Time: 1518-3437 SLP Individual Time Calculation (min): 29 min   Skilled Therapeutic Interventions:   Skilled ST services focused on education and language skills. Pt's sister was present for education. SLP facilitated education of expressive language and awareness deficits, demonstrating word finding deficits at the sentence level in picture description tasks and need to further investigate higher level cognition in OP ST services. Pt required min-supervision A verbal cues for word finding and correct phonetic and syntax errors in verbal and written tasks. Pt was able to name 6 animals within a 1 minute timeframe with improved ability to move past preservation. All questions answered to satisfaction. Pt was left in room with call bell within reach and bed/chair alarm set. SLP recommends to continue skilled services.    Patient has met 4 of 4 long term goals.  Patient to discharge at overall Supervision;Min level.  Reasons goals not met:     Clinical Impression/Discharge Summary:   Pt made great progress meeting 4 out 4 goals. Pt demonstrated improvement in expressive/written language skills at the phrase/sentence level with improved error awareness of neologisms, phonetic and syntax errors. Pt demonstrates anticipatory awareness and recall skills with supervision A, but further cognitive assessment is needed with improved language. Pt's receptive language skills appear largely intact. Pt benefited from skilled ST services in order to maximize functional independence and reduce burden of care, requiring supervision at discharge with continued skilled ST services.  Care Partner:  Caregiver Able to Provide Assistance: Yes  Type of Caregiver Assistance: Physical;Cognitive  Recommendation:  Outpatient SLP;24 hour supervision/assistance   Rationale for SLP Follow Up: Maximize functional communication;Maximize cognitive function and independence;Reduce caregiver burden   Equipment: N/A   Reasons for discharge: Discharged from hospital   Patient/Family Agrees with Progress Made and Goals Achieved: Yes    Ltanya Bayley  [facility] 06/01/2021, 3:58 PM

## 2021-06-01 NOTE — Progress Notes (Signed)
PROGRESS NOTE   Subjective/Complaints: No complaints this morning.  Discussed elevated CBGs- increasing Lantus dose, stopping Novolog, avoiding added sugars as much as possible  ROS: Patient denies fever, rash, sore throat, blurred vision, nausea, vomiting, diarrhea, cough, shortness of breath or chest pain, joint or back pain, headache, or mood change.    Objective:   No results found. Recent Labs    05/31/21 0613  WBC 6.9  HGB 13.4  HCT 40.3  PLT 293    Recent Labs    05/31/21 0613  NA 136  K 4.3  CL 102  CO2 27  GLUCOSE 223*  BUN 19  CREATININE 0.93  CALCIUM 9.3     Intake/Output Summary (Last 24 hours) at 06/01/2021 1022 Last data filed at 06/01/2021 0807 Gross per 24 hour  Intake 676 ml  Output --  Net 676 ml        Physical Exam: Vital Signs Blood pressure 138/71, pulse 77, temperature 97.8 F (36.6 C), temperature source Oral, resp. rate 17, height 5\' 8"  (1.727 m), weight 71.6 kg, SpO2 97 %. Gen: no distress, normal appearing HEENT: oral mucosa pink and moist, NCAT Cardio: Reg rate Chest: normal effort, normal rate of breathing Abd: soft, non-distended  Ext: no clubbing, cyanosis, or edema Psych: pleasant and cooperative Skin: intact Neuro: Alert, right facial droop. 4 to 4+/5 right sided grip strength. Strength is otherwise intact. Sensation intact. Mild dysarthria and word finding deficits. slowed speech.  .    Assessment/Plan: 1. Functional deficits which require 3+ hours per day of interdisciplinary therapy in a comprehensive inpatient rehab setting. Physiatrist is providing close team supervision and 24 hour management of active medical problems listed below. Physiatrist and rehab team continue to assess barriers to discharge/monitor patient progress toward functional and medical goals  Care Tool:  Bathing    Body parts bathed by patient: Right arm, Left arm, Abdomen, Chest, Front  perineal area, Buttocks, Face, Left lower leg, Right lower leg, Left upper leg, Right upper leg         Bathing assist Assist Level: Supervision/Verbal cueing     Upper Body Dressing/Undressing Upper body dressing   What is the patient wearing?: Pull over shirt    Upper body assist Assist Level: Independent    Lower Body Dressing/Undressing Lower body dressing      What is the patient wearing?: Underwear/pull up, Pants     Lower body assist Assist for lower body dressing: Independent     Toileting Toileting    Toileting assist Assist for toileting: Independent     Transfers Chair/bed transfer  Transfers assist     Chair/bed transfer assist level: Supervision/Verbal cueing     Locomotion Ambulation   Ambulation assist      Assist level: Supervision/Verbal cueing Assistive device: No Device Max distance: 250'   Walk 10 feet activity   Assist     Assist level: Supervision/Verbal cueing Assistive device: No Device   Walk 50 feet activity   Assist    Assist level: Supervision/Verbal cueing Assistive device: No Device    Walk 150 feet activity   Assist    Assist level: Supervision/Verbal cueing Assistive device: No Device  Walk 10 feet on uneven surface  activity   Assist     Assist level: Contact Guard/Touching assist Assistive device: Other (comment) (none)   Wheelchair     Assist Will patient use wheelchair at discharge?: No   Wheelchair activity did not occur: N/A         Wheelchair 50 feet with 2 turns activity    Assist    Wheelchair 50 feet with 2 turns activity did not occur: N/A       Wheelchair 150 feet activity     Assist  Wheelchair 150 feet activity did not occur: N/A       Blood pressure 138/71, pulse 77, temperature 97.8 F (36.6 C), temperature source Oral, resp. rate 17, height 5\' 8"  (1.727 m), weight 71.6 kg, SpO2 97 %.    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             -Continue IR therapies including PT, OT, and SLP  2.  Impaired mobility, ambulating 200 feet: d/c Lovenox             -antiplatelet therapy: DAPT X 3 weeks followed by ASA alone. 3. Pain Management: N/A 4. Mood: LCSW to follow for evaluation and support.             -antipsychotic agents: N/A 5. Neuropsych: This patient is not capable of making decisions on her own behalf. 6. Skin/Wound Care:  Routine pressure relief measures. 7. Fluids/Electrolytes/Nutrition: Monitor I/O.   I personally reviewed all of the patient's labs today, and lab work is within normal limits.  8. T2DM: Hgb A1C- 12.0. Will monitor BS ac/hs and titrate medications as indicated.             --on Lantus 10 and 3 units novolog for meal coverage.  Increase Lantus to 20U, stop Novolog, provided dietary education -observe for trend today -protein supplement with meals.              --continue to reinforce DM education. 9. Dyslipidemia: Continue Lipitor. 10. B 12 deficiency: Level 97. Received 1000  mcg/IM on 06/20 and repeat weekly X 3 for supplementation. --continue oral supplement. 11. HTN: Monitor BP tid--well controlled  6/27. continue 5mg  Norvasc daily, well controlled 12. Severe vitamin D deficiency: 11- supplement with ergocalciferol 50,000U weekly for 7 weeks, repeat level outpatient.  13. AKI: encourage hydration. Resolved 6/27. Repeat Cr outpatient.   LOS: 6 days A FACE TO FACE EVALUATION WAS PERFORMED  Laura [name] 06/01/2021, 10:22 AM

## 2021-06-01 NOTE — Progress Notes (Signed)
Patient ID: Laura [name], female   DOB: Dec 16, 1950, 70 y.o.   MRN: [id number] Met with pt and sister who is here for training in anticipation for discharge tomorrow. Updated regarding team conference and meeting goals. Sister feels questions answered last thing is getting Advanced Directives signed and notorized. Aware if not able to can take to any bank to get signed. Ready for discharge tomorrow.

## 2021-06-01 NOTE — Discharge Summary (Signed)
Physical Therapy Discharge Summary  Patient Details  Name: Laura [name] MRN: [id number] Date of Birth: May 10, 1951  Today's Date: 06/01/2021 PT Individual Time: 1400-1430 PT Individual Time Calculation (min): 30 min    Patient has met 9 of 9 long term goals due to improved activity tolerance, improved balance, improved postural control, increased strength, and ability to compensate for deficits.  Patient to discharge at an ambulatory level Supervision.   Patient's care partner is independent to provide the necessary physical and cognitive assistance at discharge.  Reasons goals not met: n/a  Recommendation:  Patient will benefit from ongoing skilled PT services in outpatient setting to continue to advance safe functional mobility, address ongoing impairments in dynamic balance, higher level gait training, safety awareness in order to minimize fall risk.  Equipment: No equipment provided  Reasons for discharge: treatment goals met and discharge from hospital  Patient/family agrees with progress made and goals achieved: Yes  PT Discharge Precautions/Restrictions Precautions Precautions: Fall Precaution Comments: Mild R inattention. Aphasia Restrictions Weight Bearing Restrictions: No Vision/Perception  Vision - Assessment Eye Alignment: Within Functional Limits Ocular Range of Motion: Within Functional Limits Tracking/Visual Pursuits: Able to track stimulus in all quads without difficulty Perception Perception: Within Functional Limits Praxis Praxis: Intact   Sensation Sensation Light Touch: Appears Intact Hot/Cold: Appears Intact Proprioception: Appears Intact Stereognosis: Appears Intact Coordination Gross Motor Movements are Fluid and Coordinated: No Fine Motor Movements are Fluid and Coordinated: Yes Coordination and Movement Description: Gross movement patterns limited by general unsteadiness and balance deficits Motor  Motor Motor: Within Functional  Limits Motor - Discharge Observations: Improved postural alignment and control. Delayed righting reactions with balance strategies  Mobility Bed Mobility Bed Mobility: Rolling Right;Rolling Left;Sit to Supine;Supine to Sit Rolling Right: Independent Rolling Left: Independent Supine to Sit: Independent Sit to Supine: Independent Transfers Transfers: Sit to Stand;Stand to Sit;Stand Pivot Transfers Sit to Stand: Supervision/Verbal cueing Stand to Sit: Supervision/Verbal cueing Stand Pivot Transfers: Supervision/Verbal cueing Stand Pivot Transfer Details: Verbal cues for precautions/safety;Verbal cues for technique Transfer (Assistive device): None Locomotion  Gait Ambulation: Yes Gait Assistance: Supervision/Verbal cueing Gait Distance (Feet): 150 Feet Assistive device: None Gait Assistance Details: Verbal cues for technique;Verbal cues for precautions/safety;Verbal cues for sequencing;Verbal cues for gait pattern Gait Gait: Yes Gait Pattern: Impaired Gait Pattern: Step-through pattern;Narrow base of support;Poor foot clearance - left;Poor foot clearance - right;Decreased step length - right;Decreased step length - left Stairs / Additional Locomotion Stairs: Yes Stairs Assistance: Contact Guard/Touching assist Stair Management Technique: Two rails Number of Stairs: 12 Height of Stairs: 6 Ramp: Contact Guard/touching assist Curb: Contact Guard/Touching assist Wheelchair Mobility Wheelchair Mobility: No  Trunk/Postural Assessment  Cervical Assessment Cervical Assessment: Within Functional Limits Thoracic Assessment Thoracic Assessment: Within Functional Limits Lumbar Assessment Lumbar Assessment: Within Functional Limits Postural Control Postural Control: Within Functional Limits Righting Reactions: delayed  Balance Balance Balance Assessed: Yes Standardized Balance Assessment Standardized Balance Assessment: Berg Balance Test Berg Balance Test Sit to Stand: Able to  stand without using hands and stabilize independently Standing Unsupported: Able to stand safely 2 minutes Sitting with Back Unsupported but Feet Supported on Floor or Stool: Able to sit safely and securely 2 minutes Stand to Sit: Sits safely with minimal use of hands Transfers: Able to transfer safely, minor use of hands Standing Unsupported with Eyes Closed: Able to stand 10 seconds safely Standing Ubsupported with Feet Together: Able to place feet together independently and stand 1 minute safely From Standing, Reach Forward with Outstretched Arm: Can reach confidently >25  cm (10") From Standing Position, Pick up Object from Floor: Able to pick up shoe, needs supervision From Standing Position, Turn to Look Behind Over each Shoulder: Looks behind from both sides and weight shifts well Turn 360 Degrees: Able to turn 360 degrees safely but slowly Standing Unsupported, Alternately Place Feet on Step/Stool: Able to complete 4 steps without aid or supervision Standing Unsupported, One Foot in Front: Able to take small step independently and hold 30 seconds Standing on One Leg: Able to lift leg independently and hold equal to or more than 3 seconds Total Score: 47 Timed Up and Go Test TUG: Normal TUG Normal TUG (seconds): 13.7 (average of 3 trials 14.9, 12.9, 13.3) Static Sitting Balance Static Sitting - Level of Assistance: 7: Independent Dynamic Sitting Balance Dynamic Sitting - Level of Assistance: 7: Independent Static Standing Balance Static Standing - Level of Assistance: 7: Independent Dynamic Standing Balance Dynamic Standing - Level of Assistance: 5: Stand by assistance  Extremity Assessment   RLE Assessment RLE Assessment: Exceptions to [facility] General Strength Comments: Grossly 4+/5 LLE Assessment LLE Assessment: Exceptions to [facility] General Strength Comments: Grossly 4+/5  Skilled Intervention: Pt greeted supine in bed at start of session with daughter at bedside. Daughter  confirms pt will be Dcing to her house where strict 24/7 supervision/assist will be provided. Pt denies pain and is agreeable to PT tx. Focused entirety of session on family education. Lengthy discussion held with both pt and daughter regarding home safety training, role of f/u therapies, DME rec's, safety awareness, fall prevention strategies, and energy conservation strategies. All questions and concerns addressed. Pt remained as found, supine in bed with bed alarm on. All needs in reach. Family appreciative of education.  Judah Carchi P Baleigh Rennaker PT, DPT 06/01/2021, 7:26 AM

## 2021-06-01 NOTE — Discharge Summary (Signed)
Physician Discharge Summary  Patient ID: Laura [name] MRN: [id number] DOB/AGE: [birth year] 70 y.o.  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 [facility])   Discharged Condition: good  Significant Diagnostic Studies: N/A  Labs:  Basic Metabolic Panel: Recent Labs  Lab 05/27/21 0430 05/31/21 0613  NA 137 136  K 4.2 4.3  CL 99 102  CO2 29 27  GLUCOSE 255* 223*  BUN 16 19  CREATININE 1.05* 0.93  CALCIUM 9.6 9.3  MG 2.0  --     CBC: Recent Labs  Lab 05/27/21 0430 05/31/21 0613  WBC 6.6 6.9  NEUTROABS 3.9  --   HGB 13.7 13.4  HCT 40.7 40.3  MCV 84.4 86.1  PLT 291 293    CBG: Recent Labs  Lab 06/01/21 0618 06/01/21 1202 06/01/21 1631 06/01/21 2059 06/02/21 0635  GLUCAP 216* 212* 209* 247* 179*    Brief HPI:   Laura [name] is a 70 y.o. female in relatively good health but no medical care for years who was admitted on 05/23/2021 to [facility] with garbled speech, right facial droop, vision changes and right upper committee weakness.  Per reports she started feeling weak on 06/14 but refused to come to the hospital.  MRI brain done revealing acute nonhemorrhagic infarct in left caudate head and anterior lentiform nucleus as well as chronic microvascular ischemia mildly advanced for age.  CTA head/neck was negative for AVM, aneurysm or stenosis.  She was noted to have low vitamin B12 level at 97 and was started on supplementation.  Stroke was felt to be due to small vessel disease and neurology recommended DAPT x3 weeks followed by aspirin alone.  She continued to be limited by weakness with dysarthria, tachycardia with activity, balance deficits as well as motor planning deficits with ADLs.  CIR was recommended due to functional  decline.   Hospital Course: Laura [name] was admitted to rehab 05/26/2021 for inpatient therapies to consist of PT, ST and OT at least three hours five days a week. Past admission physiatrist, therapy team and rehab RN have worked together to provide customized collaborative inpatient rehab.  She was maintained on aspirin and Plavix during his stay and follow-up CBC showed H/H and platelets to be stable.  Plavix to be discontinued after 11 days.  Follow-up CMET showed electrolytes, LFTs and renal status to be within normal limits.  She was found to have vitamin D deficiency with level of 11.31 and was started on supplementation.  Her blood pressures were monitored on TID basis and have been reasonably controlled.    Juven was added to help with low calorie malnutrition.  Her diabetes has been monitored with ac/hs CBG checks and SSI was use prn for tighter BS control.  Lantus has been titrated up to 24 units and she was advised to continue monitoring blood sugars achs basis and follow-up with PCP for input regarding meal coverage of sliding scale after discharge.  She has made steady gains during her stay with improvement in balance, mobility as well as aphasia.  Supervision is recommended for safety.  She will continue to receive outpatient PT, OT and ST at [facility] outpatient rehab after discharge   Rehab course: During patient's stay in rehab weekly team conferences were held to monitor patient's  progress, set goals and discuss barriers to discharge. At admission, patient required min assist with basic ADL tasks and with mobility.  She exhibited moderate expressive aphasia with intact receptive language and mild cognitive impairments. She  has had improvement in activity tolerance, balance, postural control as well as ability to compensate for deficits.  She requires supervision for due to right inattention and for safety with ADL tasks. She requires supervision with transfers and for ambulating 150' without  AD. She requires min to supervisory cues for word finding and correct phonetic and syntax errors. She requires supervision for recall and for awareness.  Her sisters have been very supportive and family education was completed regarding all aspects of safety and care.  Discharge disposition: 01-Home or Self Care  Diet: Heart healthy/Carb modified  Special Instructions: No driving or strenuous activity till cleared by MD. Monitor blood sugars twice daily to qid basis and follow-up with PCP for further adjustment. Recommend weekly vitamin B12 subcu injections x3 with repeat B12 level to monitor for supplementation Discharge Instructions     Ambulatory referral to Neurology   Complete by: As directed    An appointment is requested in approximately: 4 weeks   Ambulatory referral to Physical Medicine Rehab   Complete by: As directed    Hospital follow up post stroke      Allergies as of 06/02/2021   No Known Allergies      Medication List     STOP taking these medications    insulin aspart 100 UNIT/ML FlexPen Commonly known as: NOVOLOG       TAKE these medications    amLODipine 5 MG tablet Commonly known as: NORVASC Take 1 tablet (5 mg total) by mouth daily.   aspirin 81 MG EC tablet Take 1 tablet (81 mg total) by mouth daily. Swallow whole.   atorvastatin 80 MG tablet Commonly known as: Lipitor Take 1 tablet (80 mg total) by mouth every evening.   blood glucose meter kit and supplies Kit Dispense based on patient and insurance preference. Use up to four times daily as directed.   clopidogrel 75 MG tablet Commonly known as: PLAVIX Take 1 tablet (75 mg total) by mouth daily. Notes to patient: For 11 more days then you will continue on aspirin daily for stroke prevention   Comfort Touch Insulin Pen Need 32G X 6 MM Misc Generic drug: Insulin Pen Needle 1 application by Does not apply route daily.   cyanocobalamin 1000 MCG tablet Take 1 tablet (1,000 mcg total) by  mouth daily.   insulin glargine 100 UNIT/ML Solostar Pen Commonly known as: LANTUS Inject 24 Units into the skin daily. What changed: how much to take   polyethylene glycol 17 g packet Commonly known as: MIRALAX / GLYCOLAX Take 17 g by mouth daily as needed for moderate constipation.   Vitamin D (Ergocalciferol) 1.25 MG (50000 UNIT) Caps capsule Commonly known as: DRISDOL Take 1 capsule (50,000 Units total) by mouth every 7 (seven) days. Start taking on: June 03, 2021        Follow-up Information     Glean Hess, MD Follow up on 06/02/2021.   Specialty: Internal Medicine Why: Appointment @ 3:00 PM Contact information: [address] [place] [address] [phone]         Izora Ribas, MD Follow up.   Specialty: Physical Medicine and Rehabilitation Why: 09/09/21: please arrive at 9:40am for 10:00am appointment Contact information: [address] [place] [address]  [facility] Follow up.   Why: Office will call you for follow up appointment Contact information: [address]     [place] [id number] [phone]                Signed: Bary Leriche 06/02/2021, 2:15 PM

## 2021-06-01 NOTE — Progress Notes (Signed)
Occupational Therapy Session Note  Patient Details  Name: Laura [name] MRN: [id number] Date of Birth: 04-Nov-1951  Today's Date: 06/01/2021 OT Individual Time: 0759-0900 OT Individual Time Calculation (min): 61 min    Short Term Goals: Week 1:  OT Short Term Goal 1 (Week 1): STGs = LTGs  Skilled Therapeutic Interventions/Progress Updates:    Pt received in room and consented to OT tx. Pt seen for discharge analysis of ADLs including toileting, dressing, bathing, grooming, and functional transfers. Pt walked into bathroom from EOB and completed toileting with independence. Pt then undressed while seated on commode and transferred into shower with SUP. Pt bathed with SUP while seated in shower, required cuing for washing and drying thoroughness. Pt then req CGA for transfer out of shower as she was a little unsteady when standing, walked to EOB and got dressed and donned footwear independently with time. Pt then instructed to go to stand sink side for grooming, which pt was able to complete independently with time to brush out all knots in hair. Pt then brushed teeth standing sink side with independence. Pt then instructed in clothespin search and gather in room to simulate IADLs in the home. Pt req cuing to scan entire environment and appears to have peripheral vision deficits. Pt reports she will be seeing an eye doctor after discharge. After tx, pt helped back to bed and left with alarm on and all needs met.   Therapy Documentation Precautions:  Precautions Precautions: Fall Precaution Comments: Mild R inattention. Aphasia Restrictions Weight Bearing Restrictions: No  Vital Signs: Therapy Vitals Temp: 97.8 F (36.6 C) Temp Source: Oral Pulse Rate: 77 Resp: 17 BP: 138/71 Patient Position (if appropriate): Lying Oxygen Therapy SpO2: 97 % O2 Device: Room Air Pain: none    Therapy/Group: Individual Therapy  Soua Lenk 06/01/2021, 7:47 AM

## 2021-06-01 NOTE — Progress Notes (Signed)
Occupational Therapy Discharge Summary  Patient Details  Name: Laura [name] MRN: [id number] Date of Birth: [birth year]   Patient has met 7 of 9 long term goals due to improved activity tolerance, improved balance, and improved attention.  Patient to discharge at overall Supervision level.  Patient's care partner is independent to provide the necessary cognitive assistance at discharge. Pt requires occasional cuing to attend to R side, and for safety and proficiency during ADLs. During functional task, pt appears to have some peripheral visual deficits. Pt reports she will be seeing an eye doctor when she discharges.   Reasons goals not met: Pt requires supervision and cuing for safety and proficiency when bathing, req CGA for getting out of shower.   Recommendation:  Patient will benefit from ongoing skilled OT services in outpatient setting to continue to advance functional skills in the area of BADL and iADL.  Equipment: No equipment provided  Reasons for discharge: treatment goals met and discharge from hospital  Patient/family agrees with progress made and goals achieved: Yes  OT Discharge Precautions/Restrictions  Precautions Precautions: Fall Precaution Comments: Mild R inattention. Aphasia Restrictions Weight Bearing Restrictions: No  Vital Signs Therapy Vitals Temp: 97.8 F (36.6 C) Temp Source: Oral Pulse Rate: 77 Resp: 17 BP: 138/71 Patient Position (if appropriate): Lying Oxygen Therapy SpO2: 97 % O2 Device: Room Air Pain Pain Assessment Pain Scale: 0-10 Pain Score: 0-No pain ADL ADL Eating: Independent Grooming: Supervision/safety Where Assessed-Grooming: Standing at sink Upper Body Bathing: Supervision/safety Where Assessed-Upper Body Bathing: Shower Lower Body Bathing: Supervision/safety Where Assessed-Lower Body Bathing: Shower Upper Body Dressing: Setup Where Assessed-Upper Body Dressing: Edge of bed Lower Body Dressing: Independent Where  Assessed-Lower Body Dressing: Chair, Edge of bed Toileting: Independent Where Assessed-Toileting: Glass blower/designer: Programmer, applications Method: Counselling psychologist: Energy manager: Curator Method: Heritage manager: Civil engineer, contracting with back, Grab bars Vision Baseline Vision/History: Wears glasses Wears Glasses: Distance only Patient Visual Report: Peripheral vision impairment Vision Assessment?: Yes Eye Alignment: Within Functional Limits Ocular Range of Motion: Within Functional Limits Alignment/Gaze Preference: Within Defined Limits Tracking/Visual Pursuits: Able to track stimulus in all quads without difficulty Convergence: Within functional limits Visual Fields: Other (comment) (pt seems to have peripheral deficits. pt reports she feels her vision has changed but unable to express how) Perception  Perception: Within Functional Limits Inattention/Neglect: Does not attend to right side of body;Does not attend to right visual field Praxis Praxis: Intact Cognition Overall Cognitive Status: Within Functional Limits for tasks assessed Arousal/Alertness: Awake/alert Safety/Judgment: Impaired Sensation Sensation Light Touch: Appears Intact Hot/Cold: Appears Intact Proprioception: Appears Intact Stereognosis: Appears Intact Coordination Gross Motor Movements are Fluid and Coordinated: No Fine Motor Movements are Fluid and Coordinated: Yes Coordination and Movement Description: Gross movement patterns limited by general unsteadiness and balance deficits Motor  Motor Motor: Within Functional Limits Motor - Skilled Clinical Observations: leans to right Motor - Discharge Observations: Improved postural alignment and control. Delayed righting reactions with balance strategies Mobility  Bed Mobility Bed Mobility: Rolling Right;Rolling Left;Sit to Supine;Supine to Sit Rolling Right:  Independent Rolling Left: Independent Supine to Sit: Independent Sit to Supine: Independent Transfers Sit to Stand: Independent Stand to Sit: Independent  Trunk/Postural Assessment  Cervical Assessment Cervical Assessment:  (fwd head) Thoracic Assessment Thoracic Assessment: Within Functional Limits Lumbar Assessment Lumbar Assessment: Within Functional Limits Postural Control Postural Control: Within Functional Limits Righting Reactions: delayed  Balance Balance Balance Assessed: Yes Static Sitting Balance Static Sitting -  Level of Assistance: 7: Independent Dynamic Sitting Balance Dynamic Sitting - Level of Assistance: 7: Independent Static Standing Balance Static Standing - Level of Assistance: 7: Independent Dynamic Standing Balance Dynamic Standing - Level of Assistance: 5: Stand by assistance Extremity/Trunk Assessment RUE Assessment RUE Assessment: Within Functional Limits Active Range of Motion (AROM) Comments: WFL General Strength Comments: WFL but decreased awareness of arm LUE Assessment LUE Assessment: Within Functional Limits   [employer] 06/01/2021, 8:20 AM

## 2021-06-01 NOTE — Plan of Care (Signed)
  RD consulted for nutrition education regarding diabetes.   Lab Results  Component Value Date   HGBA1C 12.0 (H) 05/23/2021    Sister at bedside. RD provided "Carbohydrate Counting for People with Diabetes" handout from the Academy of Nutrition and Dietetics. Discussed different food groups and their effects on blood sugar, emphasizing carbohydrate-containing foods. Provided list of carbohydrates and recommended serving sizes of common foods.  Discussed importance of controlled and consistent carbohydrate intake throughout the day. Provided examples of ways to balance meals/snacks. Discussed diabetic friendly drink options.Teach back method used.  Expect good compliance.  Current diet order is heart healthy/carbohydrate modified, patient is consuming approximately 100% of meals at this time. Labs and medications reviewed. No further nutrition interventions warranted at this time. RD contact information provided. If additional nutrition issues arise, please re-consult RD.  Roslyn Smiling, MS, RD, LDN RD pager number/after hours weekend pager number on Amion.

## 2021-06-01 NOTE — Progress Notes (Signed)
Inpatient Rehabilitation Care Coordinator Discharge Note  The overall goal for the admission was met for:   Discharge location: Yes-HOME WITH SISTER AND HER HUSBAND CAN PROVIDE 24/7 SUPERVISION  Length of Stay: Yes-7 DAYS  Discharge activity level: Yes-SUPERVISION LEVEL  Home/community participation: Yes  Services provided included: MD, RD, PT, OT, SLP, RN, CM, Pharmacy, and SW  Financial Services: Medicare and Other: [employer]  Choices offered to/list presented to:PT AND SISTER  Follow-up services arranged: Outpatient: [facility]-PT, OT, SP WILL CALL SISTER TO ARRANGE APPOINTMENTS NO EQUIPMENT NEEDS  PCP APPOINTMENT FOR 6/29 @ 3:00 AT [place]  Comments (or additional information):SISTER HERE YESTERDAY AND FOLLOWED THERAPIES AND IS AWARE OF PT'S CARE NEEDS.  Patient/Family verbalized understanding of follow-up arrangements: Yes  Individual responsible for coordination of the follow-up plan: [name]-SISTER [phone]  Confirmed correct DME delivered: [name] 06/01/2021    Malik Paar, Gardiner Rhyme

## 2021-06-02 ENCOUNTER — Encounter: Payer: Self-pay | Admitting: Internal Medicine

## 2021-06-02 ENCOUNTER — Other Ambulatory Visit: Payer: Self-pay | Admitting: Physical Medicine and Rehabilitation

## 2021-06-02 ENCOUNTER — Other Ambulatory Visit: Payer: Self-pay

## 2021-06-02 ENCOUNTER — Ambulatory Visit (INDEPENDENT_AMBULATORY_CARE_PROVIDER_SITE_OTHER): Payer: Medicare Other | Admitting: Internal Medicine

## 2021-06-02 VITALS — BP 124/70 | HR 104 | Ht 68.0 in | Wt 159.6 lb

## 2021-06-02 DIAGNOSIS — I6300 Cerebral infarction due to thrombosis of unspecified precerebral artery: Secondary | ICD-10-CM | POA: Diagnosis not present

## 2021-06-02 DIAGNOSIS — E1169 Type 2 diabetes mellitus with other specified complication: Secondary | ICD-10-CM | POA: Diagnosis not present

## 2021-06-02 DIAGNOSIS — E785 Hyperlipidemia, unspecified: Secondary | ICD-10-CM | POA: Insufficient documentation

## 2021-06-02 DIAGNOSIS — I69321 Dysphasia following cerebral infarction: Secondary | ICD-10-CM | POA: Insufficient documentation

## 2021-06-02 DIAGNOSIS — E118 Type 2 diabetes mellitus with unspecified complications: Secondary | ICD-10-CM | POA: Diagnosis not present

## 2021-06-02 DIAGNOSIS — I69998 Other sequelae following unspecified cerebrovascular disease: Secondary | ICD-10-CM | POA: Insufficient documentation

## 2021-06-02 DIAGNOSIS — E538 Deficiency of other specified B group vitamins: Secondary | ICD-10-CM

## 2021-06-02 DIAGNOSIS — E559 Vitamin D deficiency, unspecified: Secondary | ICD-10-CM

## 2021-06-02 DIAGNOSIS — R531 Weakness: Secondary | ICD-10-CM | POA: Insufficient documentation

## 2021-06-02 LAB — GLUCOSE, CAPILLARY: Glucose-Capillary: 179 mg/dL — ABNORMAL HIGH (ref 70–99)

## 2021-06-02 MED ORDER — BLOOD GLUCOSE MONITOR KIT: PACK | 0 refills | Status: DC

## 2021-06-02 MED ORDER — COMFORT TOUCH INSULIN PEN NEED 32G X 6 MM MISC: 1.0000 "application " | Freq: Every day | 0 refills | Status: DC

## 2021-06-02 MED ORDER — CYANOCOBALAMIN 1000 MCG PO TABS: 1000.0000 ug | ORAL_TABLET | Freq: Every day | ORAL | 0 refills | Status: DC

## 2021-06-02 MED ORDER — ATORVASTATIN CALCIUM 80 MG PO TABS: 80.0000 mg | ORAL_TABLET | Freq: Every evening | ORAL | 0 refills | Status: DC

## 2021-06-02 MED ORDER — AMLODIPINE BESYLATE 5 MG PO TABS: 5.0000 mg | ORAL_TABLET | Freq: Every day | ORAL | 0 refills | Status: DC

## 2021-06-02 MED ORDER — CLOPIDOGREL BISULFATE 75 MG PO TABS: 75.0000 mg | ORAL_TABLET | Freq: Every day | ORAL | 0 refills | Status: DC

## 2021-06-02 MED ORDER — VITAMIN D (ERGOCALCIFEROL) 1.25 MG (50000 UNIT) PO CAPS: 50000.0000 [IU] | ORAL_CAPSULE | ORAL | 0 refills | Status: DC

## 2021-06-02 MED ORDER — INSULIN GLARGINE 100 UNIT/ML SOLOSTAR PEN: 24.0000 [IU] | PEN_INJECTOR | Freq: Every day | SUBCUTANEOUS | 0 refills | Status: DC

## 2021-06-02 MED ORDER — ASPIRIN 81 MG PO TBEC: 81.0000 mg | DELAYED_RELEASE_TABLET | Freq: Every day | ORAL | 11 refills | Status: AC

## 2021-06-02 NOTE — Progress Notes (Signed)
PROGRESS NOTE   Subjective/Complaints:   No issues overnite , discussed blood sugars  ROS: Patient denies CP, SOB, N/V/D   Objective:   No results found. Recent Labs    05/31/21 0613  WBC 6.9  HGB 13.4  HCT 40.3  PLT 293     Recent Labs    05/31/21 0613  NA 136  K 4.3  CL 102  CO2 27  GLUCOSE 223*  BUN 19  CREATININE 0.93  CALCIUM 9.3      Intake/Output Summary (Last 24 hours) at 06/02/2021 0857 Last data filed at 06/02/2021 0700 Gross per 24 hour  Intake 597 ml  Output --  Net 597 ml         Physical Exam: Vital Signs Blood pressure (!) 154/72, pulse 81, temperature 97.6 F (36.4 C), resp. rate 17, height 5\' 8"  (1.727 m), weight 71.6 kg, SpO2 98 %.  General: No acute distress Mood and affect are appropriate Heart: Regular rate and rhythm no rubs murmurs or extra sounds Lungs: Clear to auscultation, breathing unlabored, no rales or wheezes Abdomen: Positive bowel sounds, soft nontender to palpation, nondistended Extremities: No clubbing, cyanosis, or edema Skin: No evidence of breakdown, no evidence of rash  Ext: no clubbing, cyanosis, or edema Psych: pleasant and cooperative Skin: intact Neuro: Alert, right facial droop. 4 to 4+/5 right sided grip strength. Strength is otherwise intact. Sensation intact. Mild dysarthria and word finding deficits. slowed speech.  .    Assessment/Plan: 1. 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 Stable for D/C today F/u PCP in 3-4 weeks F/u PM&R 2 weeks See D/C summary See D/C instructions   Care Tool:  Bathing    Body parts bathed by patient: Right arm, Left arm, Abdomen, Chest, Front perineal area, Buttocks, Face, Left lower leg, Right lower leg, Left upper leg, Right upper leg         Bathing assist Assist Level: Supervision/Verbal cueing     Upper Body Dressing/Undressing Upper body dressing   What is the patient wearing?: Pull over  shirt    Upper body assist Assist Level: Independent    Lower Body Dressing/Undressing Lower body dressing      What is the patient wearing?: Underwear/pull up, Pants     Lower body assist Assist for lower body dressing: Independent     Toileting Toileting    Toileting assist Assist for toileting: Independent     Transfers Chair/bed transfer  Transfers assist     Chair/bed transfer assist level: Supervision/Verbal cueing     Locomotion Ambulation   Ambulation assist      Assist level: Supervision/Verbal cueing Assistive device: No Device Max distance: 559ft   Walk 10 feet activity   Assist     Assist level: Supervision/Verbal cueing Assistive device: No Device   Walk 50 feet activity   Assist    Assist level: Supervision/Verbal cueing Assistive device: No Device    Walk 150 feet activity   Assist    Assist level: Supervision/Verbal cueing Assistive device: No Device    Walk 10 feet on uneven surface  activity   Assist     Assist level: Contact Guard/Touching assist Assistive device: Other (  comment) (no device)   Wheelchair     Assist Will patient use wheelchair at discharge?: No   Wheelchair activity did not occur: N/A         Wheelchair 50 feet with 2 turns activity    Assist    Wheelchair 50 feet with 2 turns activity did not occur: N/A       Wheelchair 150 feet activity     Assist  Wheelchair 150 feet activity did not occur: N/A       Blood pressure (!) 154/72, pulse 81, temperature 97.6 F (36.4 C), resp. rate 17, height 5\' 8"  (1.727 m), weight 71.6 kg, SpO2 98 %.    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             -Continue IR therapies including PT, OT, and SLP  2.  Impaired mobility, ambulating 200  feet: d/c Lovenox             -antiplatelet therapy: DAPT X 3 weeks followed by ASA alone. 3. Pain Management: N/A 4. Mood: LCSW to follow for evaluation and support.             -antipsychotic agents: N/A 5. Neuropsych: This patient is not capable of making decisions on her own behalf. 6. Skin/Wound Care:  Routine pressure relief measures. 7. Fluids/Electrolytes/Nutrition: Monitor I/O.   I personally reviewed all of the patient's labs today, and lab work is within normal limits.  8. T2DM: Hgb A1C- 12.0. Will monitor BS ac/hs and titrate medications as indicated.             --on Lantus 10 and 3 units novolog for meal coverage.  Increase Lantus to 18U, stop Novolog, provided dietary education -observe for trend today -protein supplement with meals.         CBG (last 3)  Recent Labs    06/01/21 1631 06/01/21 2059 06/02/21 0635  GLUCAP 209* 247* 179*  Meal coverage held yesterday and lantus increased to 18U today , will need PCP f/u [place] NP  9. Dyslipidemia: Continue Lipitor. 10. B 12 deficiency: Level 97. Received 1000  mcg/IM on 06/20 and repeat weekly X 3 for supplementation. --continue oral supplement. 11. HTN: Monitor BP tid--well controlled  6/27. continue 5mg  Norvasc daily, well controlled Vitals:   06/01/21 1921 06/02/21 0442  BP: (!) 116/59 (!) 154/72  Pulse: 78 81  Resp: 18 17  Temp: 97.7 F (36.5 C) 97.6 F (36.4 C)  SpO2: 95% 98%   Some lability f/u with PCP 12. Severe vitamin D deficiency: 11- supplement with ergocalciferol 50,000U weekly for 7 weeks, repeat level outpatient.  13. AKI: encourage hydration. Resolved 6/27. Repeat Cr outpatient.   LOS: 7 days A FACE TO FACE EVALUATION WAS PERFORMED  06/04/21 06/02/2021, 8:57 AM

## 2021-06-02 NOTE — Progress Notes (Signed)
Date:  06/02/2021   Name:  Laura [name]   DOB:  03/09/1951   MRN:  [id number]   Chief Complaint: Establish Care (Patient is here today with her sister Cresenciano Genre. ), Diabetes, and Cerebrovascular Accident  Diabetes She presents for her follow-up diabetic visit. She has type 2 diabetes mellitus. Onset time: uncertain but just diagnosed. Her disease course has been improving. Hypoglycemia symptoms include nervousness/anxiousness. Pertinent negatives for hypoglycemia include no dizziness or headaches. Associated symptoms include weakness (mild right hand weakness). Pertinent negatives for diabetes include no chest pain. Current diabetic treatment includes insulin injections (on Lantus 24 units daily). She is following a generally healthy diet. An ACE inhibitor/angiotensin II receptor blocker is not being taken.  Cerebrovascular Accident This is a new problem. The current episode started 1 to 4 weeks ago. The problem has been gradually improving. Associated symptoms include weakness (mild right hand weakness). Pertinent negatives include no abdominal pain, arthralgias, chest pain, chills, coughing, fever, headaches, myalgias or rash. Associated symptoms comments: Speech impairment expressive asphasia and right sided weakness. Treatments tried: on plavix for 21 days total then aspirin 81 mg.  Hyperlipidemia This is a chronic problem. Pertinent negatives include no chest pain, myalgias or shortness of breath. Current antihyperlipidemic treatment includes statins (started at admission).   Lab Results  Component Value Date   CREATININE 0.93 05/31/2021   BUN 19 05/31/2021   NA 136 05/31/2021   K 4.3 05/31/2021   CL 102 05/31/2021   CO2 27 05/31/2021   Lab Results  Component Value Date   CHOL 192 05/24/2021   HDL 27 (L) 05/24/2021   LDLCALC 124 (H) 05/24/2021   TRIG 203 (H) 05/24/2021   CHOLHDL 7.1 05/24/2021   Lab Results  Component Value Date   TSH 3.160 05/23/2021   Lab Results   Component Value Date   HGBA1C 12.0 (H) 05/23/2021   Lab Results  Component Value Date   WBC 6.9 05/31/2021   HGB 13.4 05/31/2021   HCT 40.3 05/31/2021   MCV 86.1 05/31/2021   PLT 293 05/31/2021   Lab Results  Component Value Date   ALT 21 05/27/2021   AST 20 05/27/2021   ALKPHOS 61 05/27/2021   BILITOT 0.6 05/27/2021   Lab Results  Component Value Date   VITAMINB12 97 (L) 05/23/2021    Review of Systems  Constitutional:  Negative for chills, fever and unexpected weight change.  HENT:  Negative for trouble swallowing.   Respiratory:  Negative for cough, chest tightness and shortness of breath.   Cardiovascular:  Positive for leg swelling (from varicose veins). Negative for chest pain.  Gastrointestinal:  Negative for abdominal pain and constipation.  Musculoskeletal:  Negative for arthralgias and myalgias.  Skin:  Negative for color change and rash.  Neurological:  Positive for weakness (mild right hand weakness). Negative for dizziness, light-headedness and headaches.  Psychiatric/Behavioral:  Positive for decreased concentration. Negative for sleep disturbance. The patient is nervous/anxious.    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) 06/02/2021   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 (CVA) 06/02/2021   Vitamin D deficiency 06/02/2021   Aphasia due to acute stroke ([place]) 06/02/2021   Stroke (cerebrum) ([place]) 05/26/2021   Type II diabetes mellitus with complication ([place])    Vitamin B12 deficiency    Essential hypertension 05/23/2021    No Known Allergies  Past Surgical History:  Procedure Laterality Date   FOOT SURGERY     TONSILLECTOMY      Social History   Tobacco Use   Smoking status: Never    Passive exposure: Never   Smokeless tobacco: Never  Vaping Use   Vaping Use: Never used  Substance  Use Topics   Alcohol use: Never   Drug use: Never     Medication list has been reviewed and updated.  Current Meds  Medication Sig   amLODipine (NORVASC) 5 MG tablet Take 1 tablet (5 mg total) by mouth daily.   aspirin 81 MG EC tablet Take 1 tablet (81 mg total) by mouth daily. Swallow whole.   atorvastatin (LIPITOR) 80 MG tablet Take 1 tablet (80 mg total) by mouth every evening.   blood glucose meter kit and supplies KIT Dispense based on patient and insurance preference. Use up to four times daily as directed.   clopidogrel (PLAVIX) 75 MG tablet Take 1 tablet (75 mg total) by mouth daily.   cyanocobalamin 1000 MCG tablet Take 1 tablet (1,000 mcg total) by mouth daily.   Insulin Glargine (BASAGLAR KWIKPEN) 100 UNIT/ML Inject 24 Units into the skin daily.   Insulin Pen Needle (COMFORT TOUCH INSULIN PEN NEED) 32G X 6 MM MISC 1 application by Does not apply route daily.   polyethylene glycol (MIRALAX / GLYCOLAX) 17 g packet Take 17 g by mouth daily as needed for moderate constipation.   [START ON 06/03/2021] Vitamin D, Ergocalciferol, (DRISDOL) 1.25 MG (50000 UNIT) CAPS capsule Take 1 capsule (50,000 Units total) by mouth every 7 (seven) days.    PHQ 2/9 Scores 06/02/2021  PHQ - 2 Score 6  PHQ- 9 Score 17    GAD 7 : Generalized Anxiety Score 06/02/2021  Nervous, Anxious, on Edge 3  Control/stop worrying 3  Worry too much - different things 2  Trouble relaxing 2  Restless 0  Easily annoyed or irritable 0  Afraid - awful might happen 1  Total GAD 7 Score 11  Anxiety Difficulty Very difficult    BP Readings from Last 3 Encounters:  06/02/21 124/70  06/02/21 (!) 154/72  05/26/21 134/76    Physical Exam Vitals and nursing note reviewed.  Constitutional:      General: She is not in acute distress.    Appearance: Normal appearance. She is well-developed.  HENT:     Head: Normocephalic and atraumatic.  Cardiovascular:     Rate and Rhythm: Normal rate and regular rhythm.      Pulses: Normal pulses.     Heart sounds: No murmur heard. Pulmonary:     Effort: Pulmonary effort is normal. No respiratory distress.     Breath sounds: No wheezing or rhonchi.  Musculoskeletal:     Cervical back: Normal range of motion.  Lymphadenopathy:     Cervical: No cervical adenopathy.  Skin:    General: Skin is warm and dry.     Findings: No rash.  Neurological:     Mental Status: She is alert and oriented to person, place, and time.     Motor: Weakness (mild UE weakness primarily hand intrinsics) present.     Gait: Gait is intact.     Deep Tendon Reflexes:     Reflex Scores:      Brachioradialis reflexes are 2+ on the right side and 2+ on the left side.      Patellar reflexes are 2+ on the right side and 2+ on the left side. Psychiatric:  Attention and Perception: Attention normal.        Mood and Affect: Mood normal.        Speech: Speech is delayed (with some mild expressive aphasia).        Behavior: Behavior normal.    Wt Readings from Last 3 Encounters:  06/02/21 159 lb 9.6 oz (72.4 kg)  05/26/21 157 lb 13.6 oz (71.6 kg)  05/23/21 130 lb (59 kg)    BP 124/70   Pulse (!) 104   Ht _0  (1.727 m)   Wt 159 lb 9.6 oz (72.4 kg)   SpO2 99%   BMI 24.27 kg/m   Assessment and Plan: 1. 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 (CVA) Improving in right UE and LE strength Planning for outpatient PT Continue plavix for 10 more days then stop but continue aspirin Patient and family to monitor for worsening mood disorder  3. Hyperlipidemia associated with type 2 diabetes mellitus ([place]) Now on statin therapy. No side effects noted at this time Will obtain labs next visit  4. Type II diabetes mellitus with complication ([facility]) New onset at time of CVA Continue DM diet Continue Lantus 24 units Check BS 1-2 times per day - can alternate fasting and 2 hours  post meal to limit the number of finger sticks  5. Vitamin B12 deficiency Received one injection and is now on oral daily Will recheck lab next visit   Partially dictated using [name] software. Any errors are unintentional.  Halina Maidens, MD [facility]  06/02/2021

## 2021-06-02 NOTE — Patient Instructions (Signed)
Check blood sugar daily  (<120)  Fasting one day (before breakfast)  And 2 hours after supper the next day  ( range 140-160)

## 2021-06-04 ENCOUNTER — Encounter: Payer: Self-pay | Admitting: Internal Medicine

## 2021-06-15 ENCOUNTER — Other Ambulatory Visit: Payer: Self-pay | Admitting: Physical Medicine and Rehabilitation

## 2021-06-15 DIAGNOSIS — I639 Cerebral infarction, unspecified: Secondary | ICD-10-CM

## 2021-06-24 ENCOUNTER — Encounter: Payer: Self-pay | Admitting: Internal Medicine

## 2021-06-24 ENCOUNTER — Other Ambulatory Visit: Payer: Self-pay | Admitting: Physical Medicine and Rehabilitation

## 2021-06-24 ENCOUNTER — Other Ambulatory Visit: Payer: Self-pay

## 2021-06-24 MED ORDER — VITAMIN D (ERGOCALCIFEROL) 1.25 MG (50000 UNIT) PO CAPS: 50000.0000 [IU] | ORAL_CAPSULE | ORAL | 1 refills | Status: DC

## 2021-06-24 MED ORDER — ATORVASTATIN CALCIUM 80 MG PO TABS: 80.0000 mg | ORAL_TABLET | Freq: Every evening | ORAL | 1 refills | Status: DC

## 2021-06-24 MED ORDER — AMLODIPINE BESYLATE 5 MG PO TABS: 5.0000 mg | ORAL_TABLET | Freq: Every day | ORAL | 1 refills | Status: DC

## 2021-06-24 MED ORDER — CYANOCOBALAMIN 1000 MCG PO TABS
1000.0000 ug | ORAL_TABLET | Freq: Every day | ORAL | 1 refills | Status: DC
Start: 2021-06-24 — End: 2021-12-20

## 2021-06-25 ENCOUNTER — Other Ambulatory Visit: Payer: Self-pay | Admitting: Physical Medicine and Rehabilitation

## 2021-06-29 ENCOUNTER — Other Ambulatory Visit: Payer: Self-pay | Admitting: Physical Medicine and Rehabilitation

## 2021-07-05 ENCOUNTER — Ambulatory Visit: Payer: Medicare Other | Admitting: Occupational Therapy

## 2021-07-05 ENCOUNTER — Encounter: Payer: Self-pay | Admitting: Internal Medicine

## 2021-07-05 ENCOUNTER — Other Ambulatory Visit: Payer: Self-pay

## 2021-07-05 ENCOUNTER — Other Ambulatory Visit: Payer: Self-pay | Admitting: Internal Medicine

## 2021-07-05 ENCOUNTER — Ambulatory Visit: Payer: Medicare Other | Attending: Physician Assistant

## 2021-07-05 ENCOUNTER — Encounter: Payer: Self-pay | Admitting: Occupational Therapy

## 2021-07-05 DIAGNOSIS — R262 Difficulty in walking, not elsewhere classified: Secondary | ICD-10-CM | POA: Insufficient documentation

## 2021-07-05 DIAGNOSIS — R2681 Unsteadiness on feet: Secondary | ICD-10-CM | POA: Insufficient documentation

## 2021-07-05 DIAGNOSIS — R2689 Other abnormalities of gait and mobility: Secondary | ICD-10-CM | POA: Insufficient documentation

## 2021-07-05 DIAGNOSIS — M6281 Muscle weakness (generalized): Secondary | ICD-10-CM | POA: Diagnosis present

## 2021-07-05 DIAGNOSIS — R4701 Aphasia: Secondary | ICD-10-CM | POA: Insufficient documentation

## 2021-07-05 DIAGNOSIS — R278 Other lack of coordination: Secondary | ICD-10-CM

## 2021-07-05 DIAGNOSIS — R269 Unspecified abnormalities of gait and mobility: Secondary | ICD-10-CM | POA: Diagnosis present

## 2021-07-05 DIAGNOSIS — R41841 Cognitive communication deficit: Secondary | ICD-10-CM | POA: Diagnosis present

## 2021-07-05 DIAGNOSIS — I6300 Cerebral infarction due to thrombosis of unspecified precerebral artery: Secondary | ICD-10-CM

## 2021-07-05 DIAGNOSIS — R471 Dysarthria and anarthria: Secondary | ICD-10-CM | POA: Insufficient documentation

## 2021-07-05 DIAGNOSIS — E118 Type 2 diabetes mellitus with unspecified complications: Secondary | ICD-10-CM

## 2021-07-05 MED ORDER — ACCU-CHEK GUIDE VI STRP: ORAL_STRIP | 12 refills | Status: DC

## 2021-07-05 MED ORDER — ACCU-CHEK SOFTCLIX LANCETS MISC: 12 refills | Status: AC

## 2021-07-05 NOTE — Therapy (Signed)
[facility] [address] [place], [place], [address] Phone: [phone]   Fax:  [phone]  Physical Therapy Evaluation  Patient Details  Name: Laura GurneyWanda [name] MRN: [id number] Date of Birth: August 27, 1951 No data recorded  Encounter Date: 07/05/2021   PT End of Session - 07/05/21 1629     Visit Number 1    Number of Visits 25    Date for PT Re-Evaluation 09/27/21    Authorization Type eval 07/05/2021    PT Start Time 0903    PT Stop Time 1000    PT Time Calculation (min) 57 min    Equipment Utilized During Treatment Gait belt    Activity Tolerance Patient tolerated treatment well    Behavior During Therapy Flat affect;WFL for tasks assessed/performed             Past Medical History:  Diagnosis Date   Acute CVA (cerebrovascular accident) (HCC) 05/24/2021   Left forearm fracture    in high school--was casted   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    Subjective Assessment - 07/05/21 0858     Subjective Pt presents to evaluation with her sister as pt has difficulty with speech. Pt reports she has not been using an AD. Pt confirms she was admitted to hospital d/t CVA on 05/23/2021. Pt sister reports they are unsure of the onset of the stroke. Pt's sister reports she checked on her sister after she received a birthday card and noticed her sister's signature looked abnormal. She called the pt and reports her voice was slurred. When she checked on her she said she had R side facial droop, difficulty with R hand. Pt's sister reports pt initially declined going to the ED but agreed to go the following morning where she was diagnosed with a CVA. Once d/c from the hospital the pt went to rehab for a week. Pt was living alone, independently prior to her stroke, but now lives  with her sister and her sister's husband. Pt's sister says her husband walks with pt 15 mins 2x/day. Pt's sister reports after pt has been walking for a while she will start to drag her L leg. She says this has improved with time. Pt now requires some assistance with ADLs, including dressing, making her bed, and self-feeding, showering. Pt's sister reports pt now has vision issues as well as balance problems. She reports pt loses balance with dressing herself.    Patient is accompained by: Family member   sister   Pertinent History Pt presents to evaluation with her sister as pt has difficulty with speech. Pt reports she has not been using an AD. Pt confirms she was admitted to hospital d/t CVA on 05/23/2021. Pt sister reports they are unsure of the onset of the stroke. Pt's sister reports she checked on her sister after she received a birthday card and noticed her sister's signature looked abnormal. She called the pt and reports her voice was slurred. When she checked on her she said she had R side facial droop, difficulty with R hand. Pt's sister reports pt initially declined going to the ED but agreed to go the following morning where she was diagnosed with a CVA. Once d/c from the hospital the pt went to rehab for a week.  Pt was living alone, independently prior to her stroke, but now lives with her sister and her sister's husband. Pt's sister says her husband walks with pt 15 mins 2x/day. Pt's sister reports after pt has been walking for a while she will start to drag her L leg. She says this has improved with time. Pt now requires some assistance with ADLs, including dressing, making her bed, and self-feeding, showering. Pt's sister reports pt now has vision issues as well as balance problems. She reports pt loses balance with dressing herself. Other PMH includes: HTN, DMII, Vitamin b12 deficiency, HLD, Vitamin D deficiency. Pt also with dysphasia, aphasia, weakness d/t recent stroke.    Limitations  Walking;Writing;Standing;Lifting;House hold activities;Reading    How long can you sit comfortably? not affected    How long can you stand comfortably? 15 min    How long can you walk comfortably? 15 minutes    Diagnostic tests per chart MRI brain revealed acute nonhemorrhagic infarct in L caudate had and anterior lentiform nucelus and chronic microvascular   ischemia mildly advanced for age. CTA head neck negative for AVM, aneurysms or stenosis. 2D echo showing EF 65 to 70% with borderline LVH.    Patient Stated Goals Pt would like to go home again    Currently in Pain? No/denies                [facility] PT Assessment - 07/05/21 0913       Assessment   Referring Provider (PT) --    Onset Date/Surgical Date 05/23/21    Hand Dominance Right    Next MD Visit October 6th    Prior Therapy Yes      Precautions   Precautions Fall      Restrictions   Weight Bearing Restrictions No      Balance Screen   Has the patient fallen in the past 6 months --   pt is unsure, but pt sister does report stubmling/near-falls   Has the patient had a decrease in activity level because of a fear of falling?  No    Is the patient reluctant to leave their home because of a fear of falling?  No      Home Tourist information centre manager residence   living   Living Arrangements Other relatives   with sister and her sister's husband   Available Help at Discharge Family    Type of Home House    Home Access Stairs to enter    Entrance Stairs-Number of Steps 3    Entrance Stairs-Rails Right;Left    Home Layout One level    Home Equipment Shower seat;Hand held shower head;Grab bars - tub/shower      Prior Function   Level of Independence Independent   was living alone prior to stroke     Standardized Balance Assessment   Standardized Balance Assessment Berg Balance Test;Dynamic Gait Index      Berg Balance Test   Sit to Stand Able to stand without using hands and stabilize independently     Standing Unsupported Able to stand safely 2 minutes    Sitting with Back Unsupported but Feet Supported on Floor or Stool Able to sit safely and securely 2 minutes    Stand to Sit Sits safely with minimal use of hands    Transfers Able to transfer safely, minor use of hands    Standing Unsupported with Eyes Closed Able to stand 10 seconds safely    Standing Unsupported with  Feet Together Able to place feet together independently and stand 1 minute safely    From Standing, Reach Forward with Outstretched Arm Can reach confidently >25 cm (10")    From Standing Position, Pick up Object from Floor Able to pick up shoe safely and easily    From Standing Position, Turn to Look Behind Over each Shoulder Looks behind from both sides and weight shifts well    Turn 360 Degrees Able to turn 360 degrees safely in 4 seconds or less    Standing Unsupported, Alternately Place Feet on Step/Stool Able to complete >2 steps/needs minimal assist    Standing Unsupported, One Foot in Front Needs help to step but can hold 15 seconds    Standing on One Leg Unable to try or needs assist to prevent fall    Total Score 46      Dynamic Gait Index   Level Surface Normal    Change in Gait Speed Mild Impairment    Gait with Horizontal Head Turns Normal    Gait with Vertical Head Turns Normal    Gait and Pivot Turn Mild Impairment    Step Over Obstacle Normal    Step Around Obstacles Normal    Steps Mild Impairment    Total Score 21               EXAMINATION  PAIN: none  POSTURE: rounded shoulders, forward head posture, thoracic kyphosis   STRENGTH:  Graded on a 0-5 scale Grossly 4+/5 BLEs  Coordination: WFL   SENSATION: Pt able to feel light-touch B, but reports feels LLE>RLE   BALANCE: see below for BERG score, pt most challenged with SLB, tandem stance  GAIT: Pt ambulates with decreased R arm swing, decreased trunk rotation, forward head posture  OUTCOME MEASURES: TEST Outcome Interpretation   5 times sit<>stand 12 sec >60 yo, >15 sec indicates increased risk for falls  10 meter walk test              1.15   m/s 1.0 m/s indicates increased risk for falls; limited community ambulator  6 minute walk test      1398          Feet 1000 feet is community Forensic scientist Assessment 46/56 <36/56 (100% risk for falls), 37-45 (80% risk for falls); 46-51 (>50% risk for falls); 52-55 (lower risk <25% of falls)  DGI 21/24    M-CTSIB- deferred  FOTO: 70 (goal: 79)  Objective measurements completed on examination: See above findings.          PT Education - 07/05/21 1628     Education Details Exam findings, indications for PT/POC    Person(s) Educated Patient;Other (comment)   pt's sister   Methods Explanation    Comprehension Verbalized understanding              PT Short Term Goals - 07/05/21 0981       PT SHORT TERM GOAL #1   Title Patient will be independent in home exercise program to improve strength/mobility for better functional independence with ADLs.    Baseline 07/05/2021: to be initiated next visit    Time 6    Status New    Target Date 08/16/21               PT Long Term Goals - 07/05/21 0922       PT LONG TERM GOAL #1   Title Patient will increase FOTO score to equal to or greater  than 79 to demonstrate statistically significant improvement in mobility and quality of life.    Baseline 8/1: 70    Time 12    Period Weeks    Status New    Target Date 09/27/21      PT LONG TERM GOAL #2   Title Patient will increase Berg Balance score by > 6 points to demonstrate decreased fall risk during functional activities.    Baseline 8/1: 46/46    Time 12    Period Weeks    Status New    Target Date 09/27/21      PT LONG TERM GOAL #3   Title The pt will ambulate at least 1700 ft during to demonstrate return toward age norm gait ability.    Baseline 8/1: 1398 ft no AD, CGA    Time 12    Period Weeks    Status New    Target Date 09/27/21       PT LONG TERM GOAL #4   Title --    Time --    Period --    Status --    Target Date --      PT LONG TERM GOAL #5   Title --    Time --    Period --    Status --    Target Date --                    Plan - 07/05/21 1632     Clinical Impression Statement Pt is a pleasant 70 y/o female referred to PT following CVA (05/23/2021) with R side weakness. Examination reveals the following deficits: impaired gait mechanics/ability, balance, BLE strength, and sensation. Pt also with aphasia, dysphasia following stroke. Pt FOTO today was 70 with a goal score of 79 to indicate improved functional mobilitiy and QOL. Pt's BERG score (46/56) indicates pt is at an increased risk for falls. The pt will benefit from further skilled PT to improve strength, balance, and gait and to decrease fall risk.    Personal Factors and Comorbidities Comorbidity 1;Comorbidity 2;Comorbidity 3+;Sex;Age    Comorbidities HTN, DMII, dysphasia, aphasia, weakness,    Examination-Activity Limitations Locomotion Level;Stairs;Dressing;Bathing;Reach Overhead;Lift;Caring for Others    Examination-Participation Restrictions Driving;Medication Management;Personal Finances;Cleaning;Meal Prep;Yard Work;Community Activity;Shop;Other;Laundry    Stability/Clinical Decision Making Evolving/Moderate complexity    Clinical Decision Making Moderate    Rehab Potential Good    PT Frequency 2x / week    PT Duration 12 weeks    PT Treatment/Interventions ADLs/Self Care Home Management;Biofeedback;Canalith Repostioning;Aquatic Therapy;Cryotherapy;Moist Heat;DME Instruction;Gait training;Stair training;Functional mobility training;Therapeutic activities;Therapeutic exercise;Balance training;Neuromuscular re-education;Cognitive remediation;Patient/family education;Orthotic Fit/Training;Manual techniques;Passive range of motion;Energy conservation;Taping;Splinting;Vestibular;Visual/perceptual remediation/compensation;Joint Manipulations     PT Next Visit Plan M-CTSIB, initiate strength, balance, HEP    PT Home Exercise Plan to be initiated    Consulted and Agree with Plan of Care Patient;Family member/caregiver    Family Member Consulted sister             Patient will benefit from skilled therapeutic intervention in order to improve the following deficits and impairments:  Abnormal gait, Decreased activity tolerance, Decreased endurance, Decreased range of motion, Decreased strength, Hypomobility, Improper body mechanics, Decreased balance, Decreased coordination, Decreased mobility, Difficulty walking, Impaired flexibility, Postural dysfunction, Impaired vision/preception, Impaired sensation  Visit Diagnosis: Other abnormalities of gait and mobility  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 artery (HCC)  Muscle weakness (generalized)  Other lack of coordination  Unsteadiness on feet     Problem List Patient  Active Problem List   Diagnosis Date Noted   Hyperlipidemia associated with type 2 diabetes mellitus (HCC) 06/02/2021   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 [place], [place], [address] Phone: [phone]   Fax:  [phone]  Name: Laura [name] MRN: [id number] Date of Birth: 02-06-1951

## 2021-07-06 ENCOUNTER — Ambulatory Visit: Payer: Medicare Other | Admitting: Speech Pathology

## 2021-07-06 ENCOUNTER — Ambulatory Visit: Payer: Medicare Other

## 2021-07-06 DIAGNOSIS — R4701 Aphasia: Secondary | ICD-10-CM

## 2021-07-06 DIAGNOSIS — R471 Dysarthria and anarthria: Secondary | ICD-10-CM

## 2021-07-06 DIAGNOSIS — R41841 Cognitive communication deficit: Secondary | ICD-10-CM

## 2021-07-06 DIAGNOSIS — R2689 Other abnormalities of gait and mobility: Secondary | ICD-10-CM | POA: Diagnosis not present

## 2021-07-07 NOTE — Therapy (Signed)
Laura [name] MAIN [facility] SERVICES [address] [place], [place], [address] Phone: [phone]   Fax:  [phone]  Speech Language Pathology Evaluation  Patient Details  Name: Laura [name] MRN: [id number] Date of Birth: 08/07/1951 Referring Provider (SLP): Laura Dollar, PA-C   Encounter Date: 07/06/2021   End of Session - 07/07/21 1357     Visit Number 1    Number of Visits 25    Date for SLP Re-Evaluation 10/04/21    Authorization Type [employer]    SLP Start Time 1305    SLP Stop Time  1410    SLP Time Calculation (min) 65 min    Activity Tolerance Patient tolerated treatment well             Past Medical History:  Diagnosis Date   Acute CVA (cerebrovascular accident) (HCC) 05/24/2021   Left forearm fracture    in high school--was casted   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   Subjective Assessment - 07/06/21 1313     Subjective "To go home."    Patient is accompained by: --   Laura [name]   Currently in Pain? No/denies                SLP Evaluation [facility] - 07/07/21 1313       SLP Visit Information   SLP Received On 07/06/21    Referring Provider (SLP) Laura Dollar, PA-C    Onset Date 05/23/21    Medical Diagnosis CVA      Subjective   Patient/Family Stated Goal Pt would like to live independently (subjective: "Go home.")      General Information   HPI Laura [name] is a 70 year old female with past medical hx noted for DM II, HLD,   admitted at [facility] on 05/23/21 with garbled speech, right facial droop, vision changes and  RUE weakness. MRI brain showed acute infarct in left caudate head and anterior lentiform nucleus, chronic microvascular ischemia. Resulting transcortical motor aphasia, dysarthria and cognitive deficits. Blood sugar was 372 on  admission. Admitted to CIR 6/22- 06/02/21.    Behavioral/Cognition alert, cooperative, flat affect    Mobility Status ambulates      Balance Screen   Has the patient fallen in the past 6 months No   currently seeing PT   Has the patient had a decrease in activity level because of a fear of falling?  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 cognition not formally assessed today but will be evaluated in 2-3 sessions    Memory Comments sister reports short-term memory difficulties    Following Commands Follows multi-step commands inconsistently;Follows one step commands consistently      Auditory Comprehension   Overall Auditory Comprehension Other (comment)   Higher level impairments; repetition necessary however suspect hearing loss vs receptive deficits/  auditory processing   Yes/No Questions Impaired    Basic Biographical Questions 76-100% accurate   100%   Basic Immediate Environment Questions 75-100% accurate   100%   Complex Questions 50-74% accurate   66%   Commands Impaired    One Step Basic Commands 75-100% accurate    Two Step Basic Commands 75-100% accurate    Multistep Basic Commands 50-74% accurate    Conversation Moderately complex    Interfering Components Attention;Hearing;Processing speed    EffectiveTechniques Repetition      Visual Recognition/Discrimination   Discrimination Within Function Limits      Reading Comprehension   Reading Status Impaired    Word level 76-100% accurate    Sentence Level 51-75% accurate   simple sentences 100%,  mod complex 0%   Paragraph Level Not tested    Interfering Components Attention;Visual acuity      Expression   Primary Mode of Expression Verbal      Verbal Expression   Overall Verbal Expression Impaired    Initiation --   does not initiate conversations; responds when spoken to   Automatic Speech Name;Social Response    Level of Generative/Spontaneous Verbalization Phrase;Sentence    Repetition --   minimal impairment; phonemic paraphasias x2 (97% accurate)   Naming Impairment    Responsive 76-100% accurate    Confrontation 75-100% accurate   96%   Divergent 50-74% accurate    Verbal Errors Phonemic paraphasias    Pragmatics No impairment   however affect very flat   Interfering Components Attention    Effective Techniques Semantic cues;Open ended questions;Sentence completion    Non-Verbal Means of Communication Not applicable      Written Expression   Dominant Hand Right    Written Expression Exceptions to [facility]    Dictation Ability Word    Self Formulation Ability Phrase      Oral Motor/Sensory Function   Overall Oral Motor/Sensory Function Impaired    Labial ROM Reduced right    Labial Symmetry Abnormal symmetry right    Labial Strength Reduced Right    Lingual ROM Within Functional Limits    Lingual Symmetry Within Functional Limits      Motor Speech   Overall Motor Speech Impaired    Respiration Within functional limits    Phonation Normal    Resonance Within functional limits    Articulation Impaired    Level of Impairment Sentence    Intelligibility Intelligibility reduced    Word 75-100% accurate    Phrase 75-100% accurate    Sentence 75-100% accurate    Conversation 75-100% accurate    Motor Planning Witnin functional limits    Phonation WFL      Standardized Assessments   Standardized Assessments  Western Aphasia Battery revised              Western Aphasia Battery- Revised  Spontaneous Speech                           Information content                8/10                                            Fluency  5/10                                          Comprehension     Yes/No questions                 54/60                                           Auditory Word Recognition  58/60                                Sequential Commands       68/80                              Repetition                             97/100                                        Naming    Object Naming                     58/60                                           Word Fluency                        6/20                                            Sentence Completion          10/10                                             Responsive Speech              10/10                                         Aphasia Quotient                  80.2/100         Pt's severity rating was mild as indicated by an Aphasia Quotient of 80.2 (0-25=very severe, 26-50=severe, 51-75=moderate, 76 and above is mild). 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 Reading and writing assessment begun but not yet completed; reading comprehension impaired for mod complex sentences; writing impaired at word/phrase level. Mild right facial droop contributes to mild dysarthria.  SLP Education - 07/07/21 1356     Education Details divergent naming tasks for home, proposed therapy goals    Person(s) Educated Patient;Other (comment)   sister   Methods Explanation    Comprehension Verbalized understanding;Need further instruction              SLP Short Term Goals - 07/07/21 1755       SLP SHORT TERM GOAL #1   Title Patient will name average of 8+ items for simple /concrete categories.    Time 10    Period --   sessions   Status New    Target Date 08/18/21      SLP SHORT TERM GOAL #2   Title Patient will generate fluent,  grammatical, and cogent sentence to complete concrete linguistic tasks with 80% accuracy.    Time 10    Period --   sessions   Status New    Target Date 08/18/21      SLP SHORT TERM GOAL #3   Title The patient will decode and read aloud sentences of varying length and complexity with 80% accuracy.    Time 10    Period --   sessions   Status New    Target Date 08/18/21      SLP SHORT TERM GOAL #4   Title Patient will complete standardized assessment of cognitive communication skills.    Time 2    Period Weeks    Status New    Target Date 07/21/21              SLP Long Term Goals - 07/07/21 1806       SLP LONG TERM GOAL #1   Title Patient will use compensations for aphasia and dysarthria in 10 minutes moderately complex conversation to express thoughts and opinions with listener comprehension at least 90%.    Time 12    Period Weeks    Status New    Target Date 10/05/21      SLP LONG TERM GOAL #2   Title Patient will write or verbally sequence steps to complete simple to mod complex personally relevant tasks (ADLs, IADLs).    Time 12    Period Weeks    Status New    Target Date 10/05/21      SLP LONG TERM GOAL #3   Title Pt will read paragraph-level materials relating to personal interests, financial and medical matters demonstrating comprehension of details >90% accuracy.    Time 12    Period Weeks    Status New    Target Date 10/05/21              Plan - 07/07/21 1357     Clinical Impression Statement Laura [name] presents with mild aphasia, mild dysarthria, and suspected mild-moderate cognitive deficits s/p CVA. Aphasia was formally assessed using the Western Aphasia Battery-Revised. 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 Auditory comprehension, object naming, and repetition are relative strengths. Conversational speech is characterized by low output, impaired fluency. She  uses phonemic and semantic paraphasias of which is is intermittently aware. SLP used clear mask with pt which improved comprehension; she needed repetition occasionally. SLP suspects this is due to hearing loss vs comprehension as pt struggled more with distinguishing high frequency airflow sounds. She would benefit from referral to audiology for hearing evaluation. I recommend skilled ST to address aphasia, dysarthria, and cognitive-communication deficits in order to improve ability to  communicate wants and needs and increase pt independence, as pt goal is to return to living independently. SLP to proceed with assessment of cognitive impairments next 1-2 sessions and add cognitive communication goals as needed.    Speech Therapy Frequency 2x / week    Duration 12 weeks    Treatment/Interventions Language facilitation;Environmental controls;Cueing hierarchy;SLP instruction and feedback;Compensatory techniques;Cognitive reorganization;Functional tasks;Compensatory strategies;Internal/external aids;Multimodal communcation approach;Patient/family education    Potential to Achieve Goals Good    SLP Home Exercise Plan TBD    Consulted and Agree with Plan of Care Patient;Family member/caregiver             Patient will benefit from skilled therapeutic intervention in order to improve the following deficits and impairments:   Aphasia  Cognitive communication deficit  Dysarthria and anarthria    Problem List 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 [place], [place], [address] Phone: [phone]   Fax:  [phone]  Name: Laura [name] MRN: [id number] Date of Birth: 12-13-1950

## 2021-07-08 ENCOUNTER — Ambulatory Visit: Payer: Medicare Other | Admitting: Speech Pathology

## 2021-07-12 NOTE — Therapy (Signed)
[place] [facility] MAIN [facility] SERVICES [address] [place], [place], [address] Phone: [phone]   Fax:  [phone]  Occupational Therapy Evaluation  Patient Details  Name: Laura [name] MRN: [id number] Date of Birth: 11-02-1951 No data recorded  Encounter Date: 07/05/2021   OT End of Session - 07/12/21 1057     Visit Number 1    Number of Visits 24    Date for OT Re-Evaluation 10/04/21    OT Start Time 1015    OT Stop Time 1115    OT Time Calculation (min) 60 min    Activity Tolerance Patient tolerated treatment well    Behavior During Therapy [facility] for tasks assessed/performed             Past Medical History:  Diagnosis Date   Acute CVA (cerebrovascular accident) (HCC) 05/24/2021   Left forearm fracture    in high school--was casted   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   Subjective Assessment - 07/11/21 1112     Subjective  Pt denies any pain today, Pt reports she had a stroke not sure when it happened.  She sent her brother in law a birthday card and her sister noticed the hand writing was off.  May have happened around or before June 16.    Pertinent History Per chart, Kimika Streater is a 70 y.o. female in relatively good health but no medical care for years who was admitted on 05/23/2021 to [facility] with garbled speech, right facial droop, vision changes and right upper committee weakness.  Per reports she started feeling weak on 06/14 but refused to come to the hospital.  MRI brain done revealing acute nonhemorrhagic infarct in left caudate head and anterior lentiform nucleus as well as chronic microvascular ischemia mildly advanced for age.  CTA head/neck was negative for AVM, aneurysm or stenosis.  She was noted to have low vitamin B12 level at 97 and was started on  supplementation.  Stroke was felt to be due to small vessel disease and neurology recommended DAPT x3 weeks followed by aspirin alone.  She continued to be limited by weakness with dysarthria, tachycardia with activity, balance deficits as well as motor planning deficits with ADLs.  CIR was recommended due to functional decline from 05-26-2021 to 06-02-2021.    Patient Stated Goals Pt would like to go home and be as independent as possible    Currently in Pain? No/denies    Pain Score 0-No pain               [facility] OT Assessment - 07/11/21 1104       Assessment   Medical Diagnosis CVA    Onset Date/Surgical Date 05/23/21    Hand Dominance Right    Next MD Visit October 6th    Prior Therapy yes      Precautions   Precautions Fall      Restrictions   Weight Bearing Restrictions No      Balance Screen   Has the patient fallen in the past 6 months No      Home  Environment   Family/patient expects to be discharged to: Private residence    Living Arrangements Other relatives    Available Help at Discharge Family    Type of Home House  Home Access Stairs    Home Layout One level    Alternate Level Stairs - Number of Steps 3 steps to enter    Crown HoldingsBathroom Shower/Tub Walk-in Shower;Door    ComcastBathroom Toilet Standard    Home Equipment Hand held shower head;Shower seat    Lives With Family      Prior Function   Level of Independence Independent      ADL   Eating/Feeding Modified independent    Grooming Modified independent    Upper Body Bathing Supervision/safety    Lower Body Bathing Increased time    Upper Body Dressing Increased time    Lower Body Dressing Increased time    Toilet Transfer Modified independent    Toileting - Clothing Manipulation Increased time    Toileting -  Hygiene Increase time    Tub/Shower Transfer Supervision/safety      IADL   Prior Level of Function Shopping independent    Shopping Needs to be accompanied on any shopping trip    Prior Level of  Function Light Housekeeping independent    Prior Level of Function Meal Prep independent    Community Mobility Relies on family or friends for transportation      Coordination   Gross Motor Movements are Fluid and Coordinated No    Fine Motor Movements are Fluid and Coordinated No    9 Hole Peg Test Right;Left    Right 9 Hole Peg Test 52    Left 9 Hole Peg Test 30      Hand Function   Right Hand Grip (lbs) 28    Right Hand Lateral Pinch 12 lbs    Right Hand 3 Point Pinch 9 lbs    Left Hand Grip (lbs) 45    Left Hand Lateral Pinch 15 lbs    Left 3 point pinch 13 lbs    Comment 2 point pinch R 8, left 9#             Pt was discharged from inpatient rehab on June 02, 2021 and did not have home health. Pt reports she is able to make her bed, get a drink for herself.  Has difficulty with laundry, meal preparation, driving, check writing.  Vision has been impaired after CVA, reports some blurred vision in right eye, difficulty seeing TV and reading. She used to wear glasses for reading before and now uses magnifying glass at times. Able to demonstrate smooth tracking in all quadrants. Scanning  left to right, missing items on right with letter cancellation task.  Pt will full ROM of RUE, full fisting and full opposition.  Overall strength of right UE 4/5, left 5/5 Able to hold a pen and write name with poor legibility.                      OT Long Term Goals - 07/12/21 1106       OT LONG TERM GOAL #1   Title Pt will be independent with home exercise program.    Baseline no current program    Time 12    Period Weeks    Status New    Target Date 10/04/21      OT LONG TERM GOAL #2   Title Pt will improve FOTO score to 73 or above to demonstrate a clinically relevant change to impact greater independence in necessary daily tasks.    Baseline Eval score of 61    Time 12    Period Weeks  Status New    Target Date 10/04/21      OT LONG TERM GOAL #3    Title Pt will complete light meal prep with modified independence.    Baseline requires assist from family    Time 12    Period Weeks    Status New    Target Date 10/04/21      OT LONG TERM GOAL #4   Title Pt will complete laundry tasks with modified independence    Baseline Eval: requires assist from family    Time 12    Period Weeks    Status New    Target Date 10/04/21      OT LONG TERM GOAL #5   Title Pt will complete check writing with modified independence.    Baseline eval:  difficulty    Time 6    Period Weeks    Status New    Target Date 08/23/21      Long Term Additional Goals   Additional Long Term Goals --      OT LONG TERM GOAL #6   Title Pt will demonstrate handwriting with legibility 80% or greater to write demographics and signature  on important paper.    Baseline EvalL difficulty with poor legibility    Time 6    Period Weeks    Status New    Target Date 08/23/21      OT LONG TERM GOAL #7   Title Pt will complete basic self care tasks with modified independence.    Baseline increased time and occasional assist    Time 6    Period Weeks    Status New    Target Date 08/23/21      OT LONG TERM GOAL #8   Title Pt to improve coordination by reduction of time on 9 hole peg test by 10 secs to assist with performing coordination tasks with writing, buttons, zippers.    Baseline Eval: right 52 secs    Time 12    Period Weeks    Status New    Target Date 10/04/21      OT LONG TERM GOAL  #9   TITLE Pt will improve right grip strength by 10# to open jars, containers with modified independence.    Baseline Eval: right 28#    Time 12    Period Weeks    Status New    Target Date 10/04/21                   Plan - 07/12/21 1059     Clinical Impression Statement Pt is a 70 yo female who suffered a CVA on 05/23/2021 and was hospitalized until 05/26/2021, was transferred on 05/26/2021 to and attended inpatient rehab and was discharged on June 02, 2021.  She was referred for OP OT for evaluation and treatment.  She presents with decreased balance, decreased mobility, decreased strength, decreased right sided attention, decreased coordination skills and decreased ability to perform self care and IADL tasks. Pt would benefit from skilled OT services to maximize safety and independence in ADL and IADL tasks at home and in the community.    OT Occupational Profile and History Detailed Assessment- Review of Records and additional review of physical, cognitive, psychosocial history related to current functional performance    Occupational performance deficits (Please refer to evaluation for details): ADL's;IADL's;Leisure    Body Structure / Function / Physical Skills ADL;Dexterity;Flexibility;Strength;Balance;Coordination;FMC;IADL;UE functional use;Mobility    Psychosocial Skills Environmental  Adaptations;Habits;Routines and  Behaviors    Rehab Potential Good    Clinical Decision Making Limited treatment options, no task modification necessary    Comorbidities Affecting Occupational Performance: May have comorbidities impacting occupational performance    Modification or Assistance to Complete Evaluation  No modification of tasks or assist necessary to complete eval    OT Frequency 2x / week    OT Duration 12 weeks    OT Treatment/Interventions Self-care/ADL training;Cryotherapy;Therapeutic exercise;DME and/or AE instruction;Functional Mobility Training;Cognitive remediation/compensation;Balance training;Neuromuscular education;Manual Therapy;Splinting;Visual/perceptual remediation/compensation;Moist Heat;Contrast Bath;Therapeutic activities;Patient/family education    Consulted and Agree with Plan of Care Patient             Patient will benefit from skilled therapeutic intervention in order to improve the following deficits and impairments:   Body Structure / Function / Physical Skills: ADL, Dexterity, Flexibility, Strength, Balance,  Coordination, FMC, IADL, UE functional use, Mobility   Psychosocial Skills: Environmental  Adaptations, Habits, Routines and Behaviors   Visit Diagnosis: 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 artery (HCC)  Muscle weakness (generalized)  Other lack of coordination    Problem List 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 [place], [place], [address] Phone: [phone]   Fax:  [phone]  Name: Anays Detore MRN: [id number] Date of Birth: 1951-08-10

## 2021-07-13 ENCOUNTER — Ambulatory Visit: Payer: Medicare Other

## 2021-07-13 ENCOUNTER — Other Ambulatory Visit: Payer: Self-pay

## 2021-07-13 ENCOUNTER — Ambulatory Visit: Payer: Medicare Other | Admitting: Speech Pathology

## 2021-07-13 DIAGNOSIS — R471 Dysarthria and anarthria: Secondary | ICD-10-CM

## 2021-07-13 DIAGNOSIS — R2689 Other abnormalities of gait and mobility: Secondary | ICD-10-CM

## 2021-07-13 DIAGNOSIS — I6300 Cerebral infarction due to thrombosis of unspecified precerebral artery: Secondary | ICD-10-CM

## 2021-07-13 DIAGNOSIS — R41841 Cognitive communication deficit: Secondary | ICD-10-CM

## 2021-07-13 DIAGNOSIS — R278 Other lack of coordination: Secondary | ICD-10-CM

## 2021-07-13 DIAGNOSIS — R2681 Unsteadiness on feet: Secondary | ICD-10-CM

## 2021-07-13 DIAGNOSIS — M6281 Muscle weakness (generalized): Secondary | ICD-10-CM

## 2021-07-13 DIAGNOSIS — R4701 Aphasia: Secondary | ICD-10-CM

## 2021-07-13 NOTE — Therapy (Signed)
[facility] MAIN [facility] SERVICES [address] [place], [place], [address] Phone: [phone]   Fax:  [phone]  Occupational Therapy Treatment  Patient Details  Name: Laura [name] MRN: [id number] Date of Birth: 30-Jun-1951 No data recorded  Encounter Date: 07/13/2021   OT End of Session - 07/13/21 1708     Visit Number 2    Number of Visits 24    Date for OT Re-Evaluation 10/04/21    OT Start Time 1330    OT Stop Time 1415    OT Time Calculation (min) 45 min    Activity Tolerance Patient tolerated treatment well    Behavior During Therapy [facility] for tasks assessed/performed             Past Medical History:  Diagnosis Date   Acute CVA (cerebrovascular accident) (HCC) 05/24/2021   Left forearm fracture    in high school--was casted   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   Subjective Assessment - 07/13/21 1707     Subjective  "She signed one check for a bill this week. I filled out the check but she signed it." (per sister)    Pertinent History Per chart, Laura [name] is a 70 y.o. female in relatively good health but no medical care for years who was admitted on 05/23/2021 to [facility] with garbled speech, right facial droop, vision changes and right upper committee weakness.  Per reports she started feeling weak on 06/14 but refused to come to the hospital.  MRI brain done revealing acute nonhemorrhagic infarct in left caudate head and anterior lentiform nucleus as well as chronic microvascular ischemia mildly advanced for age.  CTA head/neck was negative for AVM, aneurysm or stenosis.  She was noted to have low vitamin B12 level at 97 and was started on supplementation.  Stroke was felt to be due to small vessel disease and neurology recommended DAPT x3 weeks followed by aspirin alone.   She continued to be limited by weakness with dysarthria, tachycardia with activity, balance deficits as well as motor planning deficits with ADLs.  CIR was recommended due to functional decline from 05-26-2021 to 06-02-2021.    Patient Stated Goals Pt would like to go home and be as independent as possible            Occupational Therapy Treatment: Self care: Participation in handwriting tasks signing name and writing address.  Poor legibility with poor spacing of letters when writing address, and letters are small.  Fair legibility with signature.  Practiced handwriting on lined paper to increase height of letters and mod vc for spacing letters of alphabet upper and lower case.  Provided template to practice check writing at home, as well as filling out an envelope to address to recipient of check.  Also encouraged hand writing, obtaining adult coloring book, or word search/activity book at home for improving [facility] and legibility of writing.  Pt/sister receptive.   Neuro re-ed: Instructed pt in green theraputty exercises, including gross grasping, digit opposition, key pinch, digit abd/add, and digging coins out of putty.  Pt required min A to locate 1 of 5 pennies, indep with 4/5 pennies.  Handout for theraputty issued and reviewed with pt with good return demo.  Participated in visual scanning activity with jumbo digits, searching  for duplicate numbers in a row with 100% accuracy, though increased time to complete.   Response to Treatment: Sister reports pt was able to sign one of her checks at home, while sister filled out the bill.  Handwriting with poor legibility, and R hand does fatigue with paper/pencil tasks.  Pt will continue to benefit from skilled OT to provide neuro re-ed for RUE and ADL training in order to maximize indep with self care and work towards return to living indep.  Pt is staying with sister temporarily d/t increased self care needs.     OT Education - 07/13/21 1708      Education Details HEP for theraputty and handwriting    Person(s) Educated Patient;Caregiver(s)    Methods Explanation;Verbal cues;Handout    Comprehension Verbalized understanding;Need further instruction;Verbal cues required                 OT Long Term Goals - 07/12/21 1106       OT LONG TERM GOAL #1   Title Pt will be independent with home exercise program.    Baseline no current program    Time 12    Period Weeks    Status New    Target Date 10/04/21      OT LONG TERM GOAL #2   Title Pt will improve FOTO score to 73 or above to demonstrate a clinically relevant change to impact greater independence in necessary daily tasks.    Baseline Eval score of 61    Time 12    Period Weeks    Status New    Target Date 10/04/21      OT LONG TERM GOAL #3   Title Pt will complete light meal prep with modified independence.    Baseline requires assist from family    Time 12    Period Weeks    Status New    Target Date 10/04/21      OT LONG TERM GOAL #4   Title Pt will complete laundry tasks with modified independence    Baseline Eval: requires assist from family    Time 12    Period Weeks    Status New    Target Date 10/04/21      OT LONG TERM GOAL #5   Title Pt will complete check writing with modified independence.    Baseline eval:  difficulty    Time 6    Period Weeks    Status New    Target Date 08/23/21      Long Term Additional Goals   Additional Long Term Goals --      OT LONG TERM GOAL #6   Title Pt will demonstrate handwriting with legibility 80% or greater to write demographics and signature  on important paper.    Baseline EvalL difficulty with poor legibility    Time 6    Period Weeks    Status New    Target Date 08/23/21      OT LONG TERM GOAL #7   Title Pt will complete basic self care tasks with modified independence.    Baseline increased time and occasional assist    Time 6    Period Weeks    Status New    Target Date 08/23/21       OT LONG TERM GOAL #8   Title Pt to improve coordination by reduction of time on 9 hole peg test by 10 secs to assist with performing coordination tasks with writing, buttons, zippers.  Baseline Eval: right 52 secs    Time 12    Period Weeks    Status New    Target Date 10/04/21      OT LONG TERM GOAL  #9   TITLE Pt will improve right grip strength by 10# to open jars, containers with modified independence.    Baseline Eval: right 28#    Time 12    Period Weeks    Status New    Target Date 10/04/21                   Plan - 07/13/21 1720     Clinical Impression Statement Sister reports pt was able to sign one of her checks at home, while sister filled out the bill.  Handwriting with poor legibility, and R hand does fatigue with paper/pencil tasks.  Pt will continue to benefit from skilled OT to provide neuro re-ed for RUE and ADL training in order to maximize indep with self care and work towards return to living indep.  Pt is staying with sister temporarily d/t increased self care needs.    OT Occupational Profile and History Detailed Assessment- Review of Records and additional review of physical, cognitive, psychosocial history related to current functional performance    Occupational performance deficits (Please refer to evaluation for details): ADL's;IADL's;Leisure    Body Structure / Function / Physical Skills ADL;Dexterity;Flexibility;Strength;Balance;Coordination;FMC;IADL;UE functional use;Mobility    Psychosocial Skills Environmental  Adaptations;Habits;Routines and Behaviors    Rehab Potential Good    Clinical Decision Making Limited treatment options, no task modification necessary    Comorbidities Affecting Occupational Performance: May have comorbidities impacting occupational performance    Modification or Assistance to Complete Evaluation  No modification of tasks or assist necessary to complete eval    OT Frequency 2x / week    OT Duration 12 weeks    OT  Treatment/Interventions Self-care/ADL training;Cryotherapy;Therapeutic exercise;DME and/or AE instruction;Functional Mobility Training;Cognitive remediation/compensation;Balance training;Neuromuscular education;Manual Therapy;Splinting;Visual/perceptual remediation/compensation;Moist Heat;Contrast Bath;Therapeutic activities;Patient/family education    Consulted and Agree with Plan of Care Patient;Family member/caregiver             Patient will benefit from skilled therapeutic intervention in order to improve the following deficits and impairments:   Body Structure / Function / Physical Skills: ADL, Dexterity, Flexibility, Strength, Balance, Coordination, FMC, IADL, UE functional use, Mobility   Psychosocial Skills: Environmental  Adaptations, Habits, Routines and Behaviors   Visit Diagnosis: Other lack of coordination  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 artery (HCC)  Muscle weakness (generalized)    Problem List 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 [place], [place], [address] Phone: [phone]   Fax:  [phone]  Name: Haely Leyland MRN: [id number] Date of Birth: [birth year]

## 2021-07-13 NOTE — Patient Instructions (Signed)
Tips for Talking with People who have Aphasia  . Say one thing at a time . Don't  rush - slow down, be patient . Talk face to face . Reduce background noise . Relax - be natural . Use pen and paper . Write down key words . Draw diagrams or pictures . Don't pretend you understand . Ask what helps . Recap - check you both understand . Be a partner, not a therapist   Aphasia does not affect intelligence, only language. The person with aphasia can still: make decisions, have opinions, and socialize.   Describing words  What group does it belong to?  What do I use it for?  Where can I find it?  What does it LOOK like?  What other words go with it?  What is the 1st sound of the word?   Many Ways to Communicate  Describe it Write it Draw it Gesture it Use related words  Resources  [facility] Aph[employer] [place] with Dr. Jessica Obermeyer at [employer] - email [email or web address]  TalkPath Therapy app by [employer] Virtual Connections on [employer] website National Aphasia Association - naa.aphasia.org Aphasia Recovery Connection - aphasiarecoveryconnection.org Tactus therapy apps Constant Therapy  Watch: Patience Listening and Communicating with Aphasia Patients on YouTube https://www.youtube.com/watch?v=aPTTjRTmgq0     

## 2021-07-14 ENCOUNTER — Telehealth: Payer: Self-pay | Admitting: Internal Medicine

## 2021-07-14 NOTE — Therapy (Signed)
[place] [facility] [facility] MAIN [facility] SERVICES [address] [place], [place], [address] Phone: [phone]   Fax:  [phone]  Speech Language Pathology Treatment  Patient Details  Name: Laura [name] MRN: [id number] Date of Birth: July 08, 1951 Referring Provider (SLP): Mariam Dollar, PA-C   Encounter Date: 07/13/2021   End of Session - 07/14/21 1441     Visit Number 2    Number of Visits 25    Date for SLP Re-Evaluation 10/04/21    Authorization Type [employer]    SLP Start Time 1400    SLP Stop Time  1500    SLP Time Calculation (min) 60 min    Activity Tolerance Patient tolerated treatment well             Past Medical History:  Diagnosis Date   Acute CVA (cerebrovascular accident) (HCC) 05/24/2021   Left forearm fracture    in high school--was casted   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   Subjective Assessment - 07/14/21 1437     Subjective Brought notebook/binder    Currently in Pain? No/denies                   ADULT SLP TREATMENT - 07/14/21 1438       General Information   Behavior/Cognition Alert;Cooperative    HPI Laura [name] is a 70 year old female with past medical hx noted for DM II, HLD,   admitted at [facility] on 05/23/21 with garbled speech, right facial droop, vision changes and  RUE weakness. MRI brain showed acute infarct in left caudate head and anterior lentiform nucleus, chronic microvascular ischemia. Resulting transcortical motor aphasia, dysarthria and cognitive deficits. Blood sugar was 372 on admission. Admitted to CIR 6/22- 06/02/21.      Treatment Provided   Treatment provided Cognitive-Linquistic      Cognitive-Linquistic Treatment   Treatment focused on Cognition;Aphasia;Patient/family/caregiver education    Skilled Treatment Pt read  aphasia-friendly document aloud with information RE: aphasia education. Worked on Development worker, international aid in categories for [facility]; much of these illegible due to poor spacing of letters, small writing; is working on Diplomatic Services operational officer with OT. Encouraged pt to continue with naming tasks, both verbally and in writing. Initiated cognitive testing with Cognitive Linguistic Quick Test, to be completed next session. Education re: deficit areas observed.      Assessment / Recommendations / Plan   Plan Continue with current plan of care      Progression Toward Goals   Progression toward goals Progressing toward goals              SLP Education - 07/14/21 1441     Education Details visual scanning/attention, suspect auditory processing impairment. Pt would benefit from audiologic evaluation    Person(s) Educated Patient   sister   Methods Explanation    Comprehension Verbalized understanding              SLP Short Term Goals - 07/07/21 1755       SLP SHORT TERM GOAL #1   Title Patient will name average of 8+ items for simple /concrete categories.    Time 10    Period --   sessions   Status New    Target Date 08/18/21      SLP SHORT TERM GOAL #2  Title Patient will generate fluent, grammatical, and cogent sentence to complete concrete linguistic tasks with 80% accuracy.    Time 10    Period --   sessions   Status New    Target Date 08/18/21      SLP SHORT TERM GOAL #3   Title The patient will decode and read aloud sentences of varying length and complexity with 80% accuracy.    Time 10    Period --   sessions   Status New    Target Date 08/18/21      SLP SHORT TERM GOAL #4   Title Patient will complete standardized assessment of cognitive communication skills.    Time 2    Period Weeks    Status New    Target Date 07/21/21              SLP Long Term Goals - 07/07/21 1806       SLP LONG TERM GOAL #1   Title Patient will use compensations for aphasia and dysarthria in 10 minutes  moderately complex conversation to express thoughts and opinions with listener comprehension at least 90%.    Time 12    Period Weeks    Status New    Target Date 10/05/21      SLP LONG TERM GOAL #2   Title Patient will write or verbally sequence steps to complete simple to mod complex personally relevant tasks (ADLs, IADLs).    Time 12    Period Weeks    Status New    Target Date 10/05/21      SLP LONG TERM GOAL #3   Title Pt will read paragraph-level materials relating to personal interests, financial and medical matters demonstrating comprehension of details >90% accuracy.    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 Cognitive testing initiated today and to be completed next session. SLP suspects hearing loss and well as possible auditory processing deficits impacting comprehension vs receptive language. She would benefit from referral to audiology for hearing evaluation. I recommend skilled ST to address aphasia, dysarthria, and cognitive-communication deficits in order to improve ability to communicate wants and needs and increase pt independence, as pt goal is to return to living independently. SLP to proceed with assessment of cognitive impairments next 1-2 sessions and add cognitive communication goals as needed.    Speech Therapy Frequency 2x / week    Duration 12 weeks    Treatment/Interventions Language facilitation;Environmental controls;Cueing hierarchy;SLP instruction and feedback;Compensatory techniques;Cognitive reorganization;Functional tasks;Compensatory strategies;Internal/external aids;Multimodal communcation approach;Patient/family education    Potential to Achieve Goals Good    SLP Home Exercise Plan TBD     Consulted and Agree with Plan of Care Patient;Family member/caregiver             Patient will benefit from skilled therapeutic intervention in order to improve the following deficits and impairments:   Cognitive communication deficit  Aphasia  Dysarthria and anarthria  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 artery [facility])    Problem List Patient Active Problem List   Diagnosis Date Noted   Hyperlipidemia associated with type 2 diabetes mellitus (HCC) 06/02/2021   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) 06/02/2021   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 (CVA) 06/02/2021   Vitamin D deficiency 06/02/2021   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 [place], [place], [address] Phone: [phone]   Fax:  [phone]   Name: Laura [name] MRN: [id number] Date of Birth: [birth year]

## 2021-07-14 NOTE — Therapy (Signed)
[facility] MAIN [facility] SERVICES [address] [place], [place], [address] Phone: [phone]   Fax:  [phone]  Physical Therapy Treatment  Patient Details  Name: Laura [name] MRN: [id number] Date of Birth: 19-Jun-1951 Referring Provider (PT): Carlis Abbott Drema Pry, MD   Encounter Date: 07/13/2021   PT End of Session - 07/14/21 0910     Visit Number 2    Number of Visits 25    Date for PT Re-Evaluation 09/27/21    Authorization Type eval 07/05/2021    PT Start Time 1421    PT Stop Time 1504    PT Time Calculation (min) 43 min    Equipment Utilized During Treatment Gait belt    Activity Tolerance Patient tolerated treatment well    Behavior During Therapy [facility] for tasks assessed/performed             Past Medical History:  Diagnosis Date   Acute CVA (cerebrovascular accident) (HCC) 05/24/2021   Left forearm fracture    in high school--was casted   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   Subjective Assessment - 07/14/21 0909     Subjective Pt reports no pain. Sister present at session. Reports no major changes since previous appointment.    Patient is accompained by: Family member   sister   Pertinent History Pt presents to evaluation with her sister as pt has difficulty with speech. Pt reports she has not been using an AD. Pt confirms she was admitted to hospital d/t CVA on 05/23/2021. Pt sister reports they are unsure of the onset of the stroke. Pt's sister reports she checked on her sister after she received a birthday card and noticed her sister's signature looked abnormal. She called the pt and reports her voice was slurred. When she checked on her she said she had R side facial droop, difficulty with R hand. Pt's sister reports pt initially declined going to the ED but agreed to  go the following morning where she was diagnosed with a CVA. Once d/c from the hospital the pt went to rehab for a week. Pt was living alone, independently prior to her stroke, but now lives with her sister and her sister's husband. Pt's sister says her husband walks with pt 15 mins 2x/day. Pt's sister reports after pt has been walking for a while she will start to drag her L leg. She says this has improved with time. Pt now requires some assistance with ADLs, including dressing, making her bed, and self-feeding, showering. Pt's sister reports pt now has vision issues as well as balance problems. She reports pt loses balance with dressing herself. Other PMH includes: HTN, DMII, Vitamin b12 deficiency, HLD, Vitamin D deficiency. Pt also with dysphasia, aphasia, weakness d/t recent stroke.    Limitations Walking;Writing;Standing;Lifting;House hold activities;Reading    How long can you sit comfortably? not affected    How long can you stand comfortably? 15 min    How long can you walk comfortably? 15 minutes    Diagnostic tests per chart MRI brain revealed acute nonhemorrhagic infarct in L caudate had and anterior lentiform nucelus and chronic microvascular   ischemia mildly advanced for age. CTA head neck negative for AVM, aneurysms or stenosis. 2D echo showing EF 65 to 70% with borderline LVH.  Patient Stated Goals Pt would like to go home again    Currently in Pain? No/denies             TREATMENT  M-CTSIB: Pt able to complete all 4 conditions successfully.   At support surface with CGA: SLB - 4x30 sec BLEs Tandem stance -4x30 sec BLEs Stepping forward/backward over hurdle - x multiple reps Cone-taps: 3 cones in front of pt (forward and laterally) - x multiple reps. Pt with difficulty with LLE.   Treadmill LE endurance training/gait assessment - pt ambulates for 3 minutes, ambulates up to 0.9 mph. Analysis indicates greater stance time and step length RLE<LLE. Pt rates exercise as hard.    Standing at support surface: Standing hip abduction 2x10 with 2# ankle weights.   PT instructs pt through the following exercises and also prescribes them as HEP. Instructs pt to perform STS at support surface and with sister present. Pt and sister verbalize understanding Seated hip abduction with RTB 3x15 Seated marches with 2# AW - 2x20 STS 2x12  Access Code: [email or web address] URL: [email or web address] Date: 07/13/2021 Prepared by: Temple Pacini  Exercises  Seated Hip Abduction with Resistance - 1 x daily - 4 x weekly - 3 sets - 15 reps Seated March with Resistance - 1 x daily - 4 x weekly - 2 sets - 20 reps Sit to Stand with Counter Support - 1 x daily - 4 x weekly - 2 sets - 12 reps      PT Education - 07/14/21 0910     Education Details exercise technique, body mechanics, HEP    Person(s) Educated Patient    Methods Explanation;Tactile cues;Demonstration;Verbal cues    Comprehension Verbalized understanding;Returned demonstration;Need further instruction              PT Short Term Goals - 07/05/21 8182       PT SHORT TERM GOAL #1   Title Patient will be independent in home exercise program to improve strength/mobility for better functional independence with ADLs.    Baseline 07/05/2021: to be initiated next visit    Time 6    Status New    Target Date 08/16/21               PT Long Term Goals - 07/05/21 0922       PT LONG TERM GOAL #1   Title Patient will increase FOTO score to equal to or greater than 79 to demonstrate statistically significant improvement in mobility and quality of life.    Baseline 8/1: 70    Time 12    Period Weeks    Status New    Target Date 09/27/21      PT LONG TERM GOAL #2   Title Patient will increase Berg Balance score by > 6 points to demonstrate decreased fall risk during functional activities.    Baseline 8/1: 46/46    Time 12    Period Weeks    Status New    Target Date 09/27/21      PT LONG TERM GOAL #3    Title The pt will ambulate at least 1700 ft during to demonstrate return toward age norm gait ability.    Baseline 8/1: 1398 ft no AD, CGA    Time 12    Period Weeks    Status New    Target Date 09/27/21      PT LONG TERM GOAL #4   Title --    Time --    Period --  Status --    Target Date --      PT LONG TERM GOAL #5   Title --    Time --    Period --    Status --    Target Date --             Patient will benefit from skilled therapeutic intervention in order to improve the following deficits and impairments:  Abnormal gait, Decreased activity tolerance, Decreased endurance, Decreased range of motion, Decreased strength, Hypomobility, Improper body mechanics, Decreased balance, Decreased coordination, Decreased mobility, Difficulty walking, Impaired flexibility, Postural dysfunction, Impaired vision/preception, Impaired sensation  Visit Diagnosis: Other abnormalities of gait and mobility  Other lack of coordination  Muscle weakness (generalized)  Unsteadiness on feet   Problem List 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) 06/02/2021   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 (CVA) 06/02/2021   Vitamin D deficiency 06/02/2021   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

## 2021-07-14 NOTE — Telephone Encounter (Signed)
Copied from CRM [id number]. Topic: Medicare AWV >> Jul 14, 2021 11:30 AM Claudette Laws R wrote: Reason for CRM:  Left message for patient to call back and schedule Medicare Annual Wellness Visit (AWV) in office.   If unable to come into the office for AWV,  please offer to do virtually or by telephone.  No hx of AWV eligible for AWVI as of 01/05/2018  Please schedule at anytime with [facility] Health Advisor.      40 Minutes appointment   Any questions, please call me at [phone]

## 2021-07-15 ENCOUNTER — Ambulatory Visit: Payer: Medicare Other

## 2021-07-15 ENCOUNTER — Encounter: Payer: Medicare Other | Admitting: Speech Pathology

## 2021-07-15 ENCOUNTER — Encounter: Payer: Medicare Other | Admitting: Occupational Therapy

## 2021-07-16 ENCOUNTER — Ambulatory Visit: Payer: Medicare Other

## 2021-07-16 ENCOUNTER — Ambulatory Visit: Payer: Medicare Other | Admitting: Occupational Therapy

## 2021-07-16 ENCOUNTER — Other Ambulatory Visit: Payer: Self-pay

## 2021-07-16 DIAGNOSIS — R2681 Unsteadiness on feet: Secondary | ICD-10-CM

## 2021-07-16 DIAGNOSIS — M6281 Muscle weakness (generalized): Secondary | ICD-10-CM

## 2021-07-16 DIAGNOSIS — R262 Difficulty in walking, not elsewhere classified: Secondary | ICD-10-CM

## 2021-07-16 DIAGNOSIS — R2689 Other abnormalities of gait and mobility: Secondary | ICD-10-CM | POA: Diagnosis not present

## 2021-07-16 DIAGNOSIS — R269 Unspecified abnormalities of gait and mobility: Secondary | ICD-10-CM

## 2021-07-16 DIAGNOSIS — R278 Other lack of coordination: Secondary | ICD-10-CM

## 2021-07-16 NOTE — Therapy (Signed)
[facility] MAIN [facility] SERVICES [address] [place], [place], [address] Phone: [phone]   Fax:  [phone]  Physical Therapy Treatment  Patient Details  Name: Laura [name] MRN: [id number] Date of Birth: [birth year] Referring Provider (PT): Carlis Abbott Drema Pry, MD   Encounter Date: 07/16/2021   PT End of Session - 07/16/21 1206     Visit Number 3    Number of Visits 25    Date for PT Re-Evaluation 09/27/21    Authorization Type eval 07/05/2021    PT Start Time 0845    PT Stop Time 0929    PT Time Calculation (min) 44 min    Equipment Utilized During Treatment Gait belt    Activity Tolerance Patient tolerated treatment well    Behavior During Therapy 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   Subjective Assessment - 07/16/21 0847     Subjective Patient reports no changes since last visit. Reports she has been exercising.    Patient is accompained by: Family member   sister   Pertinent History Pt presents to evaluation with her sister as pt has difficulty with speech. Pt reports she has not been using an AD. Pt confirms she was admitted to hospital d/t CVA on 05/23/2021. Pt sister reports they are unsure of the onset of the stroke. Pt's sister reports she checked on her sister after she received a birthday card and noticed her sister's signature looked abnormal. She called the pt and reports her voice was slurred. When she checked on her she said she had R side facial droop, difficulty with R hand. Pt's sister reports pt initially declined going to the ED but agreed to go the following  morning where she was diagnosed with a CVA. Once d/c from the hospital the pt went to rehab for a week. Pt was living alone, independently prior to her stroke, but now lives with her sister and her sister's husband. Pt's sister says her husband walks with pt 15 mins 2x/day. Pt's sister reports after pt has been walking for a while she will start to drag her L leg. She says this has improved with time. Pt now requires some assistance with ADLs, including dressing, making her bed, and self-feeding, showering. Pt's sister reports pt now has vision issues as well as balance problems. She reports pt loses balance with dressing herself. Other PMH includes: HTN, DMII, Vitamin b12 deficiency, HLD, Vitamin D deficiency. Pt also with dysphasia, aphasia, weakness d/t recent stroke.    Limitations Walking;Writing;Standing;Lifting;House hold activities;Reading    How long can you sit comfortably? not affected    How long can you stand comfortably? 15 min    How long can you walk comfortably? 15 minutes    Diagnostic tests per chart MRI brain revealed acute nonhemorrhagic infarct in L caudate had and anterior lentiform nucelus and chronic microvascular   ischemia mildly advanced for age. CTA head neck negative for AVM, aneurysms or stenosis. 2D echo showing EF 65 to 70% with borderline LVH.    Patient  Stated Goals Pt would like to go home again    Currently in Pain? No/denies              Interventions:  Therapeutic exercises:   Patient instructed in standing LE strengthening performed at //bars today with instruction to use kitchen sink area at home. Patient and sister verbalized understanding.  Stand hip flex Stand hip abd Stand hip ext Stand minisquat Stand knee flex Stand toe raises Stand heel raises 10 reps each leg with VC for body mechanics, UE light touch for support and to hold 2 sec for slow controlled motion.   Education provided throughout session via VC/TC and demonstration to  facilitate movement at target joints and correct muscle activation for all testing and exercises performed.       Access Code: [email or web address] URL: [email or web address] Date: 07/16/2021 Prepared by: Maureen Ralphs  Exercises Standing March with Counter Support - 1 x daily - 5 x weekly - 3 sets - 10 reps - 2 hold Standing Hip Abduction with Counter Support - 1 x daily - 5 x weekly - 3 sets - 10 reps - 2 hold Standing Hip Extension with Counter Support - 1 x daily - 5 x weekly - 3 sets - 10 reps - 2 hold Standing Knee Flexion AROM with Chair Support - 1 x daily - 5 x weekly - 3 sets - 10 reps - 2 hold Mini Squat - 1 x daily - 5 x weekly - 3 sets - 10 reps - 2 hold Heel Raises with Counter Support - 1 x daily - 5 x weekly - 3 sets - 10 reps - 2 hold Heel Toe Raises with Counter Support - 1 x daily - 5 x weekly - 3 sets - 10 reps - 2 hold  Neuromuscular re-ed:    SLB - 4x30 sec BLEs Tandem stance -4x30 sec BLEs Stepping forward/backward (heel to toe gait sequencing by placing 2 cones and using RTB for resistance) Patient performed 12 reps each leg- VC to maintain toes up during swing.   Clinical Impression: Patient performed well today- able to follow all VC, TC, and visual demo to perform standing therex safe. Patient and sister issued HEP and able to verbalize understanding. She exhibited good motivation and receptive to cues for gait safety including increasing step length and heel to toe gait sequencing. The pt will benefit from further skilled PT to improve BLE strength, endurance, balance and gait in order to return pt to PLOF.              PT Education - 07/16/21 1205     Education Details Access Code: [email or web address]  URL: [email or web address]    Person(s) Educated Patient    Methods Explanation;Demonstration;Tactile cues;Verbal cues;Handout    Comprehension Verbalized understanding;Returned demonstration;Verbal cues required;Tactile cues  required;Need further instruction              PT Short Term Goals - 07/05/21 6712       PT SHORT TERM GOAL #1   Title Patient will be independent in home exercise program to improve strength/mobility for better functional independence with ADLs.    Baseline 07/05/2021: to be initiated next visit    Time 6    Status New    Target Date 08/16/21               PT Long Term Goals - 07/05/21 0922       PT LONG TERM GOAL #1   Title Patient will increase FOTO score  to equal to or greater than 79 to demonstrate statistically significant improvement in mobility and quality of life.    Baseline 8/1: 70    Time 12    Period Weeks    Status New    Target Date 09/27/21      PT LONG TERM GOAL #2   Title Patient will increase Berg Balance score by > 6 points to demonstrate decreased fall risk during functional activities.    Baseline 8/1: 46/46    Time 12    Period Weeks    Status New    Target Date 09/27/21      PT LONG TERM GOAL #3   Title The pt will ambulate at least 1700 ft during to demonstrate return toward age norm gait ability.    Baseline 8/1: 1398 ft no AD, CGA    Time 12    Period Weeks    Status New    Target Date 09/27/21      PT LONG TERM GOAL #4   Title --    Time --    Period --    Status --    Target Date --      PT LONG TERM GOAL #5   Title --    Time --    Period --    Status --    Target Date --                   Plan - 07/16/21 1204     Clinical Impression Statement Patient performed well today- able to follow all VC, TC, and visual demo to perform standing therex safe. Patient and sister issued HEP and able to verbalize understanding. ?She exhibited good motivation and receptive to cues for gait safety including increasing step length and heel to toe gait sequencing. The pt will benefit from further skilled PT to improve BLE strength, endurance, balance and gait in order to return pt to PLOF.    Personal Factors and  Comorbidities Comorbidity 1;Comorbidity 2;Comorbidity 3+;Sex;Age    Comorbidities HTN, DMII, dysphasia, aphasia, weakness,    Examination-Activity Limitations Locomotion Level;Stairs;Dressing;Bathing;Reach Overhead;Lift;Caring for Others    Examination-Participation Restrictions Driving;Medication Management;Personal Finances;Cleaning;Meal Prep;Yard Work;Community Activity;Shop;Other;Laundry    Stability/Clinical Decision Making Evolving/Moderate complexity    Rehab Potential Good    PT Frequency 2x / week    PT Duration 12 weeks    PT Treatment/Interventions ADLs/Self Care Home Management;Biofeedback;Canalith Repostioning;Aquatic Therapy;Cryotherapy;Moist Heat;DME Instruction;Gait training;Stair training;Functional mobility training;Therapeutic activities;Therapeutic exercise;Balance training;Neuromuscular re-education;Cognitive remediation;Patient/family education;Orthotic Fit/Training;Manual techniques;Passive range of motion;Energy conservation;Taping;Splinting;Vestibular;Visual/perceptual remediation/compensation;Joint Manipulations    PT Next Visit Plan strength, endurance, balance and gait interventions    PT Home Exercise Plan Access Code: [email or web address]  URL: [email or web address]    Consulted and Agree with Plan of Care Patient;Family member/caregiver    Family Member Consulted sister             Patient will benefit from skilled therapeutic intervention in order to improve the following deficits and impairments:  Abnormal gait, Decreased activity tolerance, Decreased endurance, Decreased range of motion, Decreased strength, Hypomobility, Improper body mechanics, Decreased balance, Decreased coordination, Decreased mobility, Difficulty walking, Impaired flexibility, Postural dysfunction, Impaired vision/preception, Impaired sensation  Visit Diagnosis: Abnormality of gait and mobility  Difficulty in walking, not elsewhere classified  Muscle weakness  (generalized)  Unsteadiness on feet     Problem List Patient Active Problem List   Diagnosis Date Noted   Hyperlipidemia associated with type 2 diabetes mellitus (HCC) 06/02/2021   Dysphasia as late  effect of cerebrovascular accident (CVA) 06/02/2021   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 (CVA) 06/02/2021   Vitamin D deficiency 06/02/2021   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

## 2021-07-16 NOTE — Therapy (Signed)
[facility] MAIN [facility] SERVICES [address] [place], [place], [address] Phone: [phone]   Fax:  [phone]  Occupational Therapy Treatment  Patient Details  Name: Laura [name] MRN: [id number] Date of Birth: [birth year] No data recorded  Encounter Date: 07/16/2021   OT End of Session - 07/16/21 1130     Visit Number 3    Number of Visits 24    Date for OT Re-Evaluation 10/04/21    OT Start Time 0933    OT Stop Time 1015    OT Time Calculation (min) 42 min    Activity Tolerance Patient tolerated treatment well    Behavior During Therapy [facility] for tasks assessed/performed             Past Medical History:  Diagnosis Date   Acute CVA (cerebrovascular accident) (HCC) 05/24/2021   Left forearm fracture    in high school--was casted   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   Subjective Assessment - 07/16/21 1129     Subjective  "We got her some writing and coloring activities for home." (per sister, Laura [name])    Patient is accompanied by: Family member    Pertinent History Per chart, Laura [name] is a 70 y.o. female in relatively good health but no medical care for years who was admitted on 05/23/2021 to [facility] with garbled speech, right facial droop, vision changes and right upper committee weakness.  Per reports she started feeling weak on 06/14 but refused to come to the hospital.  MRI brain done revealing acute nonhemorrhagic infarct in left caudate head and anterior lentiform nucleus as well as chronic microvascular ischemia mildly advanced for age.  CTA head/neck was negative for AVM, aneurysm or stenosis.  She was noted to have low vitamin B12 level at 97 and was started on supplementation.  Stroke was felt to be due to small vessel disease and neurology recommended DAPT x3 weeks  followed by aspirin alone.  She continued to be limited by weakness with dysarthria, tachycardia with activity, balance deficits as well as motor planning deficits with ADLs.  CIR was recommended due to functional decline from 05-26-2021 to 06-02-2021.    Patient Stated Goals Pt would like to go home and be as independent as possible    Currently in Pain? No/denies    Pain Score 0-No pain            Occupational Therapy Treatment: Therapeutic Exercise: Participated in hand strengthening/coordination activities, working to improve hand writing and engagement of RUE during self care.  Used hand gripper for R hand at 11.2 lbs to place and remove jumbo pegs from pegboard x 2 trials.  Rest breaks during and between 2 trials d/t hand fatigue.  Addressed R 3 point and lateral pinch strength using therapy resistant jumbo clothespins to clip onto horizontal bar.  Pt required repeated attempts for heavy resistant (black) clothespins.  Address object manipulation picking up 3 jumbo pegs at a time and discarding into holes on pegboards without dropping; able to perform with min compensation strategies.  Worked on rotating pegs to flip 180 degrees from 1 row to the next.    Response to Treatment: Sister present during session today and reported that they were able to obtain an adult coloring book, jumbo word search,  and wide lined note pad and pt has been practicing with all at home, including practice with writing checks and filling out envelopes.  Sister presented pt's work from her folder and handwriting legibility is improving.  Sister and sister's spouse remain very encouraging of pt and pt has been cooperative with HEP d/t eagerness to return home alone.  Pt continues with RUE apraxia, weakness, and decreased coordination and will benefit from continued skilled OT to address these deficits to enable indep with ADLs/IADLs with end goal to return home alone.     OT Education - 07/16/21 1130     Education  Details Handwriting activities for home (handout given)    Person(s) Educated Patient;Caregiver(s)    Methods Explanation;Verbal cues;Handout    Comprehension Verbalized understanding;Need further instruction;Verbal cues required                 OT Long Term Goals - 07/12/21 1106       OT LONG TERM GOAL #1   Title Pt will be independent with home exercise program.    Baseline no current program    Time 12    Period Weeks    Status New    Target Date 10/04/21      OT LONG TERM GOAL #2   Title Pt will improve FOTO score to 73 or above to demonstrate a clinically relevant change to impact greater independence in necessary daily tasks.    Baseline Eval score of 61    Time 12    Period Weeks    Status New    Target Date 10/04/21      OT LONG TERM GOAL #3   Title Pt will complete light meal prep with modified independence.    Baseline requires assist from family    Time 12    Period Weeks    Status New    Target Date 10/04/21      OT LONG TERM GOAL #4   Title Pt will complete laundry tasks with modified independence    Baseline Eval: requires assist from family    Time 12    Period Weeks    Status New    Target Date 10/04/21      OT LONG TERM GOAL #5   Title Pt will complete check writing with modified independence.    Baseline eval:  difficulty    Time 6    Period Weeks    Status New    Target Date 08/23/21      Long Term Additional Goals   Additional Long Term Goals --      OT LONG TERM GOAL #6   Title Pt will demonstrate handwriting with legibility 80% or greater to write demographics and signature  on important paper.    Baseline EvalL difficulty with poor legibility    Time 6    Period Weeks    Status New    Target Date 08/23/21      OT LONG TERM GOAL #7   Title Pt will complete basic self care tasks with modified independence.    Baseline increased time and occasional assist    Time 6    Period Weeks    Status New    Target Date 08/23/21       OT LONG TERM GOAL #8   Title Pt to improve coordination by reduction of time on 9 hole peg test by 10 secs to assist with performing coordination tasks with writing, buttons, zippers.    Baseline Eval:  right 52 secs    Time 12    Period Weeks    Status New    Target Date 10/04/21      OT LONG TERM GOAL  #9   TITLE Pt will improve right grip strength by 10# to open jars, containers with modified independence.    Baseline Eval: right 28#    Time 12    Period Weeks    Status New    Target Date 10/04/21                   Plan - 07/16/21 1140     Clinical Impression Statement Sister present during session today and reported that they were able to obtain an adult coloring book, jumbo word search, and wide lined note pad and pt has been practicing with all at home, including practice with writing checks and filling out envelopes.  Sister presented pt's work from her folder and handwriting legibility is improving.  Sister and sister's spouse remain very encouraging of pt and pt has been cooperative with HEP d/t eagerness to return home alone.  Pt continues with RUE apraxia, weakness, and decreased coordination and will benefit from continued skilled OT to address these deficits to enable indep with ADLs/IADLs with end goal to return home alone.    OT Occupational Profile and History Detailed Assessment- Review of Records and additional review of physical, cognitive, psychosocial history related to current functional performance    Occupational performance deficits (Please refer to evaluation for details): ADL's;IADL's;Leisure    Body Structure / Function / Physical Skills ADL;Dexterity;Flexibility;Strength;Balance;Coordination;FMC;IADL;UE functional use;Mobility    Psychosocial Skills Environmental  Adaptations;Habits;Routines and Behaviors    Rehab Potential Good    Clinical Decision Making Limited treatment options, no task modification necessary    Comorbidities Affecting  Occupational Performance: May have comorbidities impacting occupational performance    Modification or Assistance to Complete Evaluation  No modification of tasks or assist necessary to complete eval    OT Frequency 2x / week    OT Duration 12 weeks    OT Treatment/Interventions Self-care/ADL training;Cryotherapy;Therapeutic exercise;DME and/or AE instruction;Functional Mobility Training;Cognitive remediation/compensation;Balance training;Neuromuscular education;Manual Therapy;Splinting;Visual/perceptual remediation/compensation;Moist Heat;Contrast Bath;Therapeutic activities;Patient/family education    Consulted and Agree with Plan of Care Patient;Family member/caregiver    Family Member Consulted sister, Laura [name]             Patient will benefit from skilled therapeutic intervention in order to improve the following deficits and impairments:   Body Structure / Function / Physical Skills: ADL, Dexterity, Flexibility, Strength, Balance, Coordination, FMC, IADL, UE functional use, Mobility   Psychosocial Skills: Environmental  Adaptations, Habits, Routines and Behaviors   Visit Diagnosis: Other lack of coordination  Muscle weakness (generalized)    Problem List 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) 06/02/2021   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 (CVA) 06/02/2021   Vitamin D deficiency 06/02/2021   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

## 2021-07-19 ENCOUNTER — Other Ambulatory Visit: Payer: Self-pay | Admitting: Internal Medicine

## 2021-07-19 ENCOUNTER — Encounter: Payer: Self-pay | Admitting: Internal Medicine

## 2021-07-19 DIAGNOSIS — E118 Type 2 diabetes mellitus with unspecified complications: Secondary | ICD-10-CM

## 2021-07-19 MED ORDER — COMFORT TOUCH INSULIN PEN NEED 32G X 6 MM MISC: 1.0000 "application " | Freq: Every day | 0 refills | Status: DC

## 2021-07-19 MED ORDER — BASAGLAR KWIKPEN 100 UNIT/ML ~~LOC~~ SOPN: 24.0000 [IU] | PEN_INJECTOR | Freq: Every day | SUBCUTANEOUS | 1 refills | Status: DC

## 2021-07-20 ENCOUNTER — Ambulatory Visit: Payer: Medicare Other

## 2021-07-20 ENCOUNTER — Other Ambulatory Visit: Payer: Self-pay

## 2021-07-20 ENCOUNTER — Ambulatory Visit: Payer: Medicare Other | Admitting: Physical Therapy

## 2021-07-20 ENCOUNTER — Ambulatory Visit: Payer: Medicare Other | Admitting: Speech Pathology

## 2021-07-20 DIAGNOSIS — R2681 Unsteadiness on feet: Secondary | ICD-10-CM

## 2021-07-20 DIAGNOSIS — R4701 Aphasia: Secondary | ICD-10-CM

## 2021-07-20 DIAGNOSIS — I6300 Cerebral infarction due to thrombosis of unspecified precerebral artery: Secondary | ICD-10-CM

## 2021-07-20 DIAGNOSIS — R269 Unspecified abnormalities of gait and mobility: Secondary | ICD-10-CM

## 2021-07-20 DIAGNOSIS — M6281 Muscle weakness (generalized): Secondary | ICD-10-CM

## 2021-07-20 DIAGNOSIS — R262 Difficulty in walking, not elsewhere classified: Secondary | ICD-10-CM

## 2021-07-20 DIAGNOSIS — R278 Other lack of coordination: Secondary | ICD-10-CM

## 2021-07-20 DIAGNOSIS — R41841 Cognitive communication deficit: Secondary | ICD-10-CM

## 2021-07-20 DIAGNOSIS — R2689 Other abnormalities of gait and mobility: Secondary | ICD-10-CM

## 2021-07-20 NOTE — Therapy (Signed)
[facility] MAIN [facility] SERVICES [address] [place], [place], [address] Phone: [phone]   Fax:  [phone]  Speech Language Pathology Treatment  Patient Details  Name: Laura [name] MRN: [id number] Date of Birth: [birth year] Referring Provider (SLP): Mariam Dollar, PA-C   Encounter Date: 07/20/2021   End of Session - 07/20/21 1636     Visit Number 3    Number of Visits 25    Date for SLP Re-Evaluation 10/04/21    Authorization Type [employer]    SLP Start Time 1400    SLP Stop Time  1500    SLP Time Calculation (min) 60 min    Activity Tolerance Patient tolerated treatment well             Past Medical History:  Diagnosis Date   Acute CVA (cerebrovascular accident) (HCC) 05/24/2021   Left forearm fracture    in high school--was casted   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   Subjective Assessment - 07/20/21 1629     Subjective Has been working on homework daily    Patient is accompained by: Family member   brother in law   Currently in Pain? No/denies                   ADULT SLP TREATMENT - 07/20/21 1630       General Information   Behavior/Cognition Alert;Cooperative    HPI Analissa [name] is a 70 year old female with past medical hx noted for DM II, HLD,   admitted at [facility] on 05/23/21 with garbled speech, right facial droop, vision changes and  RUE weakness. MRI brain showed acute infarct in left caudate head and anterior lentiform nucleus, chronic microvascular ischemia. Resulting transcortical motor aphasia, dysarthria and cognitive deficits. Blood sugar was 372 on admission. Admitted to CIR 6/22- 06/02/21.      Treatment Provided   Treatment provided Cognitive-Linquistic      Cognitive-Linquistic Treatment   Treatment focused on Aphasia;Cognition     Skilled Treatment Completed Cognitive-Linguistic Quick Test with scores as follows:  Attention 162/215 (mild), Memory 122/185 (moderate), Executive functions 27/40 (WNL), Language 24/37 (moderate), Visuospatial skills 87/105  (WNL). Overall severity rating =3.0 (mild). Educated pt on compensations for anomia, targeted simple categorization. Pt ID'd categories when given specific items 75% accuracy with min-mod cues. Occasional min cues for spelling. Pt  generated items for simple category 100% accuracy. Provided tasks for home practice as well as check-writing task per sister's request last session.      Assessment / Recommendations / Plan   Plan Continue with current plan of care      Progression Toward Goals   Progression toward goals Progressing toward goals              SLP Education - 07/20/21 1636     Education Details deficit areas, goals    Person(s) Educated Patient;Caregiver(s)    Methods Explanation;Verbal cues;Handout    Comprehension Verbalized understanding;Need further instruction              SLP Short Term Goals - 07/07/21 1755       SLP SHORT TERM GOAL #1   Title Patient will name average of 8+ items for simple /concrete categories.    Time 10    Period --  sessions   Status New    Target Date 08/18/21      SLP SHORT TERM GOAL #2   Title Patient will generate fluent, grammatical, and cogent sentence to complete concrete linguistic tasks with 80% accuracy.    Time 10    Period --   sessions   Status New    Target Date 08/18/21      SLP SHORT TERM GOAL #3   Title The patient will decode and read aloud sentences of varying length and complexity with 80% accuracy.    Time 10    Period --   sessions   Status New    Target Date 08/18/21      SLP SHORT TERM GOAL #4   Title Patient will complete standardized assessment of cognitive communication skills.    Time 2    Period Weeks    Status New    Target Date 07/21/21              SLP Long  Term Goals - 07/07/21 1806       SLP LONG TERM GOAL #1   Title Patient will use compensations for aphasia and dysarthria in 10 minutes moderately complex conversation to express thoughts and opinions with listener comprehension at least 90%.    Time 12    Period Weeks    Status New    Target Date 10/05/21      SLP LONG TERM GOAL #2   Title Patient will write or verbally sequence steps to complete simple to mod complex personally relevant tasks (ADLs, IADLs).    Time 12    Period Weeks    Status New    Target Date 10/05/21      SLP LONG TERM GOAL #3   Title Pt will read paragraph-level materials relating to personal interests, financial and medical matters demonstrating comprehension of details >90% accuracy.    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 Cognitive testing completed showing deficits in attention, memory. SLP suspects hearing loss and well as possible auditory processing deficits impacting comprehension vs receptive language. She would benefit from referral to audiology for hearing evaluation. I recommend skilled ST to address aphasia, dysarthria, and cognitive-communication deficits in order to improve ability to communicate wants and needs and increase pt independence, as pt goal is to return to living independently.    Speech Therapy Frequency 2x / week    Duration 12 weeks    Treatment/Interventions Language facilitation;Environmental controls;Cueing hierarchy;SLP instruction and feedback;Compensatory techniques;Cognitive reorganization;Functional tasks;Compensatory strategies;Internal/external aids;Multimodal communcation approach;Patient/family education    Potential to Achieve Goals Good    SLP Home Exercise Plan TBD     Consulted and Agree with Plan of Care Patient;Family member/caregiver             Patient will benefit from skilled therapeutic intervention in order to improve the following deficits and impairments:   Aphasia  Cognitive communication deficit    Problem List 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 [place], [place], [address] Phone: [phone]   Fax:  [phone]   Name: Imanii Gosdin MRN: [id number] Date of Birth: 11-06-1951

## 2021-07-20 NOTE — Therapy (Signed)
[facility] MAIN [facility] SERVICES [address] [place], [place], [address] Phone: [phone]   Fax:  [phone]  Physical Therapy Treatment  Patient Details  Name: Laura [name] MRN: [id number] Date of Birth: [birth year] Referring Provider (PT): Carlis Abbott Drema Pry, MD   Encounter Date: 07/20/2021   PT End of Session - 07/20/21 1258     Visit Number 4    Number of Visits 25    Date for PT Re-Evaluation 09/27/21    Authorization Type eval 07/05/2021    PT Start Time 1515    PT Stop Time 1600    PT Time Calculation (min) 45 min    Equipment Utilized During Treatment Gait belt    Activity Tolerance Patient tolerated treatment well    Behavior During Therapy [facility] for tasks assessed/performed             Past Medical History:  Diagnosis Date   Acute CVA (cerebrovascular accident) (HCC) 05/24/2021   Left forearm fracture    in high school--was casted   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   Subjective Assessment - 07/20/21 1519     Subjective Patient reports no changes since last visit. Reports she has been performing HEP. No questions or concerns.    Patient is accompained by: Family member   sister   Pertinent History Pt presents to evaluation with her sister as pt has difficulty with speech. Pt reports she has not been using an AD. Pt confirms she was admitted to hospital d/t CVA on 05/23/2021. Pt sister reports they are unsure of the onset of the stroke. Pt's sister reports she checked on her sister after she received a birthday card and noticed her sister's signature looked abnormal. She called the pt and reports her voice was slurred. When she checked on her she said she had R side facial droop, difficulty with R hand. Pt's sister reports pt initially declined going to the ED but  agreed to go the following morning where she was diagnosed with a CVA. Once d/c from the hospital the pt went to rehab for a week. Pt was living alone, independently prior to her stroke, but now lives with her sister and her sister's husband. Pt's sister says her husband walks with pt 15 mins 2x/day. Pt's sister reports after pt has been walking for a while she will start to drag her L leg. She says this has improved with time. Pt now requires some assistance with ADLs, including dressing, making her bed, and self-feeding, showering. Pt's sister reports pt now has vision issues as well as balance problems. She reports pt loses balance with dressing herself. Other PMH includes: HTN, DMII, Vitamin b12 deficiency, HLD, Vitamin D deficiency. Pt also with dysphasia, aphasia, weakness d/t recent stroke.    Limitations Walking;Writing;Standing;Lifting;House hold activities;Reading    How long can you sit comfortably? not affected    How long can you stand comfortably? 15 min    How long can you walk comfortably? 15 minutes    Diagnostic tests per chart MRI brain revealed acute nonhemorrhagic infarct in L caudate had and anterior lentiform nucelus and chronic microvascular   ischemia mildly advanced for age. CTA head neck negative for AVM, aneurysms or stenosis. 2D echo showing EF 65 to 70% with borderline  LVH.    Patient Stated Goals Pt would like to go home again    Currently in Pain? No/denies             INTERVENTIONS   Gait: 4 x 52ft with VC on RLE foot clearance, encouraged heel strike and increased gait velocity.  Therex: STS, no UE support, 2 x 10 reps; Heel raises on step 2 x 15 reps; Step ups 2 x 15 reps;   Neuromuscular re-ed:    SLB - 4x30 sec BLEs Tandem stance -2x30 sec BLE non-compliant surface; 2x30 sec BLE Airex pad;  Ambulation in clinic hallway: 4 x 69ft with horizontal head turns reading sticky notes on wall, preference to look left. 2 x 49ft with cognitive dual tasking,  counting backwards from 50 by 2, decreased gait velocity and increased pause between numbers.    Clinical Impression: Patient arrived with excellent motivation to today's session. She was able to follow all VC, TC, and visual demo to perform standing therex and neuromuscular re-ed safe and correctly. Patient and brother confirm understanding of HEP, no questions at this time. Session was progressed by adding additional BLE strengthening therex and neuromuscular re-ed. Gait with dual tasking and head turns/visual scanning. Increased difficulty with dual tasking; preference for leftward gaze, VC for bilateral visual scanning. She was receptive to cues for gait safety including increasing step length, step height and cadence as well as heel to toe gait sequencing with encouraged heel strike. The pt will benefit from further skilled PT to improve BLE strength, endurance, balance and gait in order to return pt to PLOF.               PT Short Term Goals - 07/05/21 9371       PT SHORT TERM GOAL #1   Title Patient will be independent in home exercise program to improve strength/mobility for better functional independence with ADLs.    Baseline 07/05/2021: to be initiated next visit    Time 6    Status New    Target Date 08/16/21               PT Long Term Goals - 07/05/21 0922       PT LONG TERM GOAL #1   Title Patient will increase FOTO score to equal to or greater than 79 to demonstrate statistically significant improvement in mobility and quality of life.    Baseline 8/1: 70    Time 12    Period Weeks    Status New    Target Date 09/27/21      PT LONG TERM GOAL #2   Title Patient will increase Berg Balance score by > 6 points to demonstrate decreased fall risk during functional activities.    Baseline 8/1: 46/46    Time 12    Period Weeks    Status New    Target Date 09/27/21      PT LONG TERM GOAL #3   Title The pt will ambulate at least 1700 ft during to  demonstrate return toward age norm gait ability.    Baseline 8/1: 1398 ft no AD, CGA    Time 12    Period Weeks    Status New    Target Date 09/27/21      PT LONG TERM GOAL #4   Title --    Time --    Period --    Status --    Target Date --      PT  LONG TERM GOAL #5   Title --    Time --    Period --    Status --    Target Date --                   Plan - 07/20/21 1734     Clinical Impression Statement Patient arrived with excellent motivation to today's session. She was able to follow all VC, TC, and visual demo to perform standing therex and neuromuscular re-ed safe and correctly. Patient and brother confirm understanding of HEP, no questions at this time. Session was progressed by adding additional BLE strengthening therex and neuromuscular re-ed. Gait with dual tasking and head turns/visual scanning. Increased difficulty with dual tasking; preference for leftward gaze, VC for bilateral visual scanning. She was receptive to cues for gait safety including increasing step length, step height and cadence as well as heel to toe gait sequencing with encouraged heel strike. The pt will benefit from further skilled PT to improve BLE strength, endurance, balance and gait in order to return pt to PLOF.    Personal Factors and Comorbidities Comorbidity 1;Comorbidity 2;Comorbidity 3+;Sex;Age    Comorbidities HTN, DMII, dysphasia, aphasia, weakness,    Examination-Activity Limitations Locomotion Level;Stairs;Dressing;Bathing;Reach Overhead;Lift;Caring for Others    Examination-Participation Restrictions Driving;Medication Management;Personal Finances;Cleaning;Meal Prep;Yard Work;Community Activity;Shop;Other;Laundry    Stability/Clinical Decision Making Evolving/Moderate complexity    Rehab Potential Good    PT Frequency 2x / week    PT Duration 12 weeks    PT Treatment/Interventions ADLs/Self Care Home Management;Biofeedback;Canalith Repostioning;Aquatic Therapy;Cryotherapy;Moist  Heat;DME Instruction;Gait training;Stair training;Functional mobility training;Therapeutic activities;Therapeutic exercise;Balance training;Neuromuscular re-education;Cognitive remediation;Patient/family education;Orthotic Fit/Training;Manual techniques;Passive range of motion;Energy conservation;Taping;Splinting;Vestibular;Visual/perceptual remediation/compensation;Joint Manipulations    PT Next Visit Plan strength, endurance, balance and gait interventions    PT Home Exercise Plan Access Code: [email or web address]  URL: [email or web address]    Consulted and Agree with Plan of Care Patient;Family member/caregiver    Family Member Consulted sister             Patient will benefit from skilled therapeutic intervention in order to improve the following deficits and impairments:  Abnormal gait, Decreased activity tolerance, Decreased endurance, Decreased range of motion, Decreased strength, Hypomobility, Improper body mechanics, Decreased balance, Decreased coordination, Decreased mobility, Difficulty walking, Impaired flexibility, Postural dysfunction, Impaired vision/preception, Impaired sensation  Visit Diagnosis: Other lack of coordination  Muscle weakness (generalized)  Abnormality of gait and mobility  Difficulty in walking, not elsewhere classified  Unsteadiness on feet  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 artery (HCC)  Other abnormalities of gait and mobility     Problem List 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 [place], [place], [address] Phone: [phone]   Fax:  [phone]  Name: Laura [name] MRN: [id number] Date of Birth: January 05, 1951

## 2021-07-21 NOTE — Therapy (Signed)
[place] [facility] MAIN [facility] SERVICES [address] [place], [place], [address] Phone: [phone]   Fax:  [phone]  Occupational Therapy Treatment  Patient Details  Name: Laura [name] MRN: [id number] Date of Birth: 1951/08/11 No data recorded  Encounter Date: 07/20/2021   OT End of Session - 07/21/21 0808     Visit Number 4    Number of Visits 24    Date for OT Re-Evaluation 10/04/21    OT Start Time 1256    OT Stop Time 1345    OT Time Calculation (min) 49 min    Activity Tolerance Patient tolerated treatment well    Behavior During Therapy [facility] for tasks assessed/performed             Past Medical History:  Diagnosis Date   Acute CVA (cerebrovascular accident) (HCC) 05/24/2021   Left forearm fracture    in high school--was casted   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   Subjective Assessment - 07/20/21 0806     Subjective  "I've been working on my writing."    Patient is accompanied by: Family member    Pertinent History Per chart, Laura [name] is a 70 y.o. female in relatively good health but no medical care for years who was admitted on 05/23/2021 to [facility] with garbled speech, right facial droop, vision changes and right upper committee weakness.  Per reports she started feeling weak on 06/14 but refused to come to the hospital.  MRI brain done revealing acute nonhemorrhagic infarct in left caudate head and anterior lentiform nucleus as well as chronic microvascular ischemia mildly advanced for age.  CTA head/neck was negative for AVM, aneurysm or stenosis.  She was noted to have low vitamin B12 level at 97 and was started on supplementation.  Stroke was felt to be due to small vessel disease and neurology recommended DAPT x3 weeks followed by aspirin alone.  She continued to be  limited by weakness with dysarthria, tachycardia with activity, balance deficits as well as motor planning deficits with ADLs.  CIR was recommended due to functional decline from 05-26-2021 to 06-02-2021.    Patient Stated Goals Pt would like to go home and be as independent as possible    Currently in Pain? No/denies    Pain Score 0-No pain    Multiple Pain Sites No            Occupational Therapy Treatment: Therapeutic Exercise: Participation in grip/pinch/object manipulation activities, working to improve functional use of RUE for self care.  Used hand gripper at 11.2 lbs for 1 trial, 17.9 lbs for a 2nd trial, to remove jumbo pegs from pegboard.  2 rest breaks needed to complete 2nd trial without dropping pegs.  Used therapy resistant clothespins to address lateral and 3 point pinch on R hand.  Practiced picking up washers from dish with targeted placement threaded over vertical dowel.  Object manipulation practiced by rotating jumbo pegs in hand with minimal forearm movement, picking up 1 at a time, holding up to 3 in hand, and discarding from palm 1 at a time.  Good ability to perform without dropping but extra time required for motor planning.   Response to Treatment: Pt making steady gains toward OT goals.  RUE weakness and apraxia continue to limit  ADL performance.  Pt will continue to benefit from skilled OT to address above noted deficits to maximize indep with self care and work toward return to PLOF.  Pt's goal remains to return back home where she lived alone, and she continues to stay with sister and sister's husband for ADL assist.     OT Education - 07/20/21 0807     Education Details HEP    Person(s) Educated Patient;Caregiver(s)    Methods Explanation;Verbal cues;Handout    Comprehension Verbalized understanding;Need further instruction;Verbal cues required                 OT Long Term Goals - 07/12/21 1106       OT LONG TERM GOAL #1   Title Pt will be  independent with home exercise program.    Baseline no current program    Time 12    Period Weeks    Status New    Target Date 10/04/21      OT LONG TERM GOAL #2   Title Pt will improve FOTO score to 73 or above to demonstrate a clinically relevant change to impact greater independence in necessary daily tasks.    Baseline Eval score of 61    Time 12    Period Weeks    Status New    Target Date 10/04/21      OT LONG TERM GOAL #3   Title Pt will complete light meal prep with modified independence.    Baseline requires assist from family    Time 12    Period Weeks    Status New    Target Date 10/04/21      OT LONG TERM GOAL #4   Title Pt will complete laundry tasks with modified independence    Baseline Eval: requires assist from family    Time 12    Period Weeks    Status New    Target Date 10/04/21      OT LONG TERM GOAL #5   Title Pt will complete check writing with modified independence.    Baseline eval:  difficulty    Time 6    Period Weeks    Status New    Target Date 08/23/21      Long Term Additional Goals   Additional Long Term Goals --      OT LONG TERM GOAL #6   Title Pt will demonstrate handwriting with legibility 80% or greater to write demographics and signature  on important paper.    Baseline EvalL difficulty with poor legibility    Time 6    Period Weeks    Status New    Target Date 08/23/21      OT LONG TERM GOAL #7   Title Pt will complete basic self care tasks with modified independence.    Baseline increased time and occasional assist    Time 6    Period Weeks    Status New    Target Date 08/23/21      OT LONG TERM GOAL #8   Title Pt to improve coordination by reduction of time on 9 hole peg test by 10 secs to assist with performing coordination tasks with writing, buttons, zippers.    Baseline Eval: right 52 secs    Time 12    Period Weeks    Status New    Target Date 10/04/21      OT LONG TERM GOAL  #9   TITLE Pt will improve  right grip strength  by 10# to open jars, containers with modified independence.    Baseline Eval: right 28#    Time 12    Period Weeks    Status New    Target Date 10/04/21                   Plan - 07/20/21 0817     Clinical Impression Statement Pt making steady gains toward OT goals.  RUE weakness and apraxia continue to limit ADL performance.  Pt will continue to benefit from skilled OT to address above noted deficits to maximize indep with self care and work toward return to PLOF.  Pt's goal remains to return back home where she lived alone, and she continues to stay with sister and sister's husband for ADL assist.    OT Occupational Profile and History Detailed Assessment- Review of Records and additional review of physical, cognitive, psychosocial history related to current functional performance    Occupational performance deficits (Please refer to evaluation for details): ADL's;IADL's;Leisure    Body Structure / Function / Physical Skills ADL;Dexterity;Flexibility;Strength;Balance;Coordination;FMC;IADL;UE functional use;Mobility    Psychosocial Skills Environmental  Adaptations;Habits;Routines and Behaviors    Rehab Potential Good    Clinical Decision Making Limited treatment options, no task modification necessary    Comorbidities Affecting Occupational Performance: May have comorbidities impacting occupational performance    Modification or Assistance to Complete Evaluation  No modification of tasks or assist necessary to complete eval    OT Frequency 2x / week    OT Duration 12 weeks    OT Treatment/Interventions Self-care/ADL training;Cryotherapy;Therapeutic exercise;DME and/or AE instruction;Functional Mobility Training;Cognitive remediation/compensation;Balance training;Neuromuscular education;Manual Therapy;Splinting;Visual/perceptual remediation/compensation;Moist Heat;Contrast Bath;Therapeutic activities;Patient/family education    Consulted and Agree with Plan of Care  Patient;Family member/caregiver    Family Member Consulted brother-in-law, Ree Kida             Patient will benefit from skilled therapeutic intervention in order to improve the following deficits and impairments:   Body Structure / Function / Physical Skills: ADL, Dexterity, Flexibility, Strength, Balance, Coordination, FMC, IADL, UE functional use, Mobility   Psychosocial Skills: Environmental  Adaptations, Habits, Routines and Behaviors   Visit Diagnosis: Other lack of coordination  Muscle weakness (generalized)  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 artery [facility])    Problem List 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 [facility] MAIN [facility] SERVICES [address] [place], [place], [address] Phone: [phone]   Fax:  [phone]  Name: Keylie Beavers MRN: [id number] Date of Birth: Oct 25, 1951

## 2021-07-22 ENCOUNTER — Encounter: Payer: Self-pay | Admitting: Occupational Therapy

## 2021-07-22 ENCOUNTER — Ambulatory Visit: Payer: Medicare Other

## 2021-07-22 ENCOUNTER — Ambulatory Visit: Payer: Medicare Other | Admitting: Occupational Therapy

## 2021-07-22 ENCOUNTER — Ambulatory Visit: Payer: Medicare Other | Admitting: Speech Pathology

## 2021-07-22 ENCOUNTER — Other Ambulatory Visit: Payer: Self-pay

## 2021-07-22 ENCOUNTER — Encounter: Payer: Medicare Other | Admitting: Speech Pathology

## 2021-07-22 DIAGNOSIS — R262 Difficulty in walking, not elsewhere classified: Secondary | ICD-10-CM

## 2021-07-22 DIAGNOSIS — R269 Unspecified abnormalities of gait and mobility: Secondary | ICD-10-CM

## 2021-07-22 DIAGNOSIS — R2689 Other abnormalities of gait and mobility: Secondary | ICD-10-CM

## 2021-07-22 DIAGNOSIS — R4701 Aphasia: Secondary | ICD-10-CM

## 2021-07-22 DIAGNOSIS — R471 Dysarthria and anarthria: Secondary | ICD-10-CM

## 2021-07-22 DIAGNOSIS — R278 Other lack of coordination: Secondary | ICD-10-CM

## 2021-07-22 DIAGNOSIS — M6281 Muscle weakness (generalized): Secondary | ICD-10-CM

## 2021-07-22 DIAGNOSIS — R41841 Cognitive communication deficit: Secondary | ICD-10-CM

## 2021-07-22 DIAGNOSIS — R2681 Unsteadiness on feet: Secondary | ICD-10-CM

## 2021-07-22 NOTE — Therapy (Signed)
[place] [facility] MAIN [facility] SERVICES [address] [place], [place], [address] Phone: [phone]   Fax:  [phone]  Speech Language Pathology Treatment  Patient Details  Name: Laura [name] MRN: [id number] Date of Birth: 1951/02/02 Referring Provider (SLP): Mariam Dollar, PA-C   Encounter Date: 07/22/2021   End of Session - 07/22/21 1637     Visit Number 4    Number of Visits 25    Date for SLP Re-Evaluation 10/04/21    Authorization Type [employer]    SLP Start Time 1400    SLP Stop Time  1500    SLP Time Calculation (min) 60 min    Activity Tolerance Patient tolerated treatment well             Past Medical History:  Diagnosis Date   Acute CVA (cerebrovascular accident) (HCC) 05/24/2021   Left forearm fracture    in high school--was casted   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   Subjective Assessment - 07/22/21 1632     Subjective Completed home tasks    Patient is accompained by: Family member   sister   Currently in Pain? No/denies                   ADULT SLP TREATMENT - 07/22/21 1633       General Information   Behavior/Cognition Alert;Cooperative   flat affect   HPI Laura [name] is a 70 year old female with past medical hx noted for DM II, HLD,   admitted at [facility] on 05/23/21 with garbled speech, right facial droop, vision changes and  RUE weakness. MRI brain showed acute infarct in left caudate head and anterior lentiform nucleus, chronic microvascular ischemia. Resulting transcortical motor aphasia, dysarthria and cognitive deficits. Blood sugar was 372 on admission. Admitted to CIR 6/22- 06/02/21.      Treatment Provided   Treatment provided Cognitive-Linquistic      Cognitive-Linquistic Treatment   Treatment focused on Aphasia;Cognition     Skilled Treatment Reviewed home exercises; moderate cues required for categorization ~40% of the time. Encouraged pt that all responses are beneficial and that her work at home is helping stimulate language. Developed home program on [employer] for additional home practice. Sentence completion 100% accuracy with mildly extended time; ID'd parts of a whole 100% accuracy. Pt enjoyed doing crosswords prior to her CVA but felt these were too difficult to attempt at home. Provided simple crossword; responsive naming accuracy 70%, 100% with mod cues. Home tasks assigned.      Assessment / Recommendations / Plan   Plan Continue with current plan of care      Progression Toward Goals   Progression toward goals Progressing toward goals              SLP Education - 07/22/21 1637     Education Details home tasks    Person(s) Educated Patient;Caregiver(s)    Methods Explanation    Comprehension Verbalized understanding;Need further instruction              SLP Short Term Goals - 07/07/21 1755       SLP SHORT TERM GOAL #1   Title Patient will name average of 8+ items for simple /concrete categories.    Time 10    Period --  sessions   Status New    Target Date 08/18/21      SLP SHORT TERM GOAL #2   Title Patient will generate fluent, grammatical, and cogent sentence to complete concrete linguistic tasks with 80% accuracy.    Time 10    Period --   sessions   Status New    Target Date 08/18/21      SLP SHORT TERM GOAL #3   Title The patient will decode and read aloud sentences of varying length and complexity with 80% accuracy.    Time 10    Period --   sessions   Status New    Target Date 08/18/21      SLP SHORT TERM GOAL #4   Title Patient will complete standardized assessment of cognitive communication skills.    Time 2    Period Weeks    Status New    Target Date 07/21/21              SLP Long Term Goals - 07/07/21 1806       SLP LONG TERM GOAL #1   Title  Patient will use compensations for aphasia and dysarthria in 10 minutes moderately complex conversation to express thoughts and opinions with listener comprehension at least 90%.    Time 12    Period Weeks    Status New    Target Date 10/05/21      SLP LONG TERM GOAL #2   Title Patient will write or verbally sequence steps to complete simple to mod complex personally relevant tasks (ADLs, IADLs).    Time 12    Period Weeks    Status New    Target Date 10/05/21      SLP LONG TERM GOAL #3   Title Pt will read paragraph-level materials relating to personal interests, financial and medical matters demonstrating comprehension of details >90% accuracy.    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 Cognitive testing completed showing deficits in attention, memory. SLP suspects hearing loss and well as possible auditory processing deficits impacting comprehension vs receptive language. She would benefit from referral to audiology for hearing evaluation. I recommend skilled ST to address aphasia, dysarthria, and cognitive-communication deficits in order to improve ability to communicate wants and needs and increase pt independence, as pt goal is to return to living independently.    Speech Therapy Frequency 2x / week    Duration 12 weeks    Treatment/Interventions Language facilitation;Environmental controls;Cueing hierarchy;SLP instruction and feedback;Compensatory techniques;Cognitive reorganization;Functional tasks;Compensatory strategies;Internal/external aids;Multimodal communcation approach;Patient/family education    Potential to Achieve Goals Good    SLP Home Exercise Plan TBD    Consulted and Agree with Plan of Care Patient;Family  member/caregiver             Patient will benefit from skilled therapeutic intervention in order to improve the following deficits and impairments:   Aphasia  Cognitive communication deficit  Dysarthria and anarthria    Problem List 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 [place], [place], [address] Phone: [phone]   Fax:  [phone]   Name: Wanona Stare MRN: [id number] Date of Birth: 1950/12/12

## 2021-07-22 NOTE — Therapy (Signed)
[place] [facility] MAIN [facility] SERVICES [address] [place], [place], [address] Phone: [phone]   Fax:  [phone]  Physical Therapy Treatment  Patient Details  Name: Laura [name] MRN: [id number] Date of Birth: 20-Dec-1950 Referring Provider (PT): Carlis Abbott Drema Pry, MD   Encounter Date: 07/22/2021   PT End of Session - 07/22/21 1311     Visit Number 5    Number of Visits 25    Date for PT Re-Evaluation 09/27/21    Authorization Type Medicare (traditional)    Authorization Time Period 07/05/21-09/27/21    Progress Note Due on Visit 10    PT Start Time 1302    PT Stop Time 1352    PT Time Calculation (min) 50 min    Equipment Utilized During Treatment Gait belt    Activity Tolerance Patient tolerated treatment well;No increased pain    Behavior During Therapy [facility] for tasks assessed/performed             Past Medical History:  Diagnosis Date   Acute CVA (cerebrovascular accident) (HCC) 05/24/2021   Left forearm fracture    in high school--was casted   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   Subjective Assessment - 07/22/21 1310     Subjective No updates since last session. Pt still walking and working on advancing AMB.    Pertinent History Pt presents to evaluation with her sister as pt has difficulty with speech. Pt reports she has not been using an AD. Pt confirms she was admitted to hospital d/t CVA on 05/23/2021. Pt sister reports they are unsure of the onset of the stroke. Pt's sister reports she checked on her sister after she received a birthday card and noticed her sister's signature looked abnormal. She called the pt and reports her voice was slurred. When she checked on her she said she had R side facial droop, difficulty with R hand. Pt's sister reports pt initially  declined going to the ED but agreed to go the following morning where she was diagnosed with a CVA. Once d/c from the hospital the pt went to rehab for a week. Pt was living alone, independently prior to her stroke, but now lives with her sister and her sister's husband. Pt's sister says her husband walks with pt 15 mins 2x/day. Pt's sister reports after pt has been walking for a while she will start to drag her L leg. She says this has improved with time. Pt now requires some assistance with ADLs, including dressing, making her bed, and self-feeding, showering. Pt's sister reports pt now has vision issues as well as balance problems. She reports pt loses balance with dressing herself. Other PMH includes: HTN, DMII, Vitamin b12 deficiency, HLD, Vitamin D deficiency. Pt also with dysphasia, aphasia, weakness d/t recent stroke.    Currently in Pain? No/denies                [facility] PT Assessment - 07/22/21 0001       Standardized Balance Assessment   Standardized Balance Assessment Mini-BESTest      Mini-BESTest   Sit To Stand Normal: Comes to stand without use of hands and stabilizes independently.    Rise to Toes < 3 s.    Stand on one leg (left) Severe: Unable  Stand on one leg (right) Moderate: < 20 s    Stand on one leg - lowest score 0    Compensatory Stepping Correction - Forward Moderate: More than one step is required to recover equilibrium    Compensatory Stepping Correction - Backward Moderate: More than one step is required to recover equilibrium    Compensatory Stepping Correction - Left Lateral Moderate: Several steps to recover equilibrium    Compensatory Stepping Correction - Right Lateral Moderate: Several steps to recover equilibrium    Stepping Corredtion Lateral - lowest score 1    Stance - Feet together, eyes open, firm surface  Normal: 30s    Stance - Feet together, eyes closed, foam surface  Severe: Unable    Incline - Eyes Closed Normal: Stands independently 30s and  aligns with gravity    Change in Gait Speed Normal: Significantly changes walkling speed without imbalance    Walk with head turns - Horizontal Severe: performs head turns with imbalance    Walk with pivot turns Moderate:Turns with feet close SLOW (>4 steps) with good balance.    Step over obstacles Normal: Able to step over box with minimal change of gait speed and with good balance.    Timed UP & GO with Dual Task Moderate: Dual Task affects either counting OR walking (>10%) when compared to the TUG without Dual Task.   11.08sec; 10.73sec   Mini-BEST total score 15                     PT Short Term Goals - 07/05/21 7322       PT SHORT TERM GOAL #1   Title Patient will be independent in home exercise program to improve strength/mobility for better functional independence with ADLs.    Baseline 07/05/2021: to be initiated next visit    Time 6    Status New    Target Date 08/16/21               PT Long Term Goals - 07/22/21 1726       PT LONG TERM GOAL #1   Title Patient will increase FOTO score to equal to or greater than 79 to demonstrate statistically significant improvement in mobility and quality of life.    Baseline 8/1: 70    Time 12    Period Weeks    Status On-going    Target Date 09/27/21      PT LONG TERM GOAL #2   Title Patient will increase Berg Balance score by > 6 points to demonstrate decreased fall risk during functional activities.    Baseline 8/1: 46/56    Time 12    Period Weeks    Status On-going    Target Date 09/27/21      PT LONG TERM GOAL #3   Title The pt will ambulate at least 1700 ft during to demonstrate return toward age norm gait ability.    Baseline 8/1: 1398 ft no AD, CGA    Time 12    Period Weeks    Status On-going    Target Date 09/27/21      PT LONG TERM GOAL #4   Title Pt to demonstrate improvement in Mini-BesTest score to 22/28 or higher to represent improved balance and reduced falls risk.    Baseline On  07/22/21: 15/28    Time 8    Period Weeks    Status New    Target Date 09/16/21  Plan - 07/22/21 1716     Clinical Impression Statement Pt making excellent progress overall started session with overground gait training, author noted no frank gait deviation of legs or trunk that would speak to CVA-realted impairment, further assessment reveals better SLS time on Rt side than on the uninvolved side. After discussion with patient and DTR, Thereasa Parkin explained that baseline measures of and BBT may not be the most sensitive tools to demonstrate the full extenst of patient's post-CVA balance dysfunction. Both patient and family encorse continued aberrance in gait patterns compared to PLOF. Author moved to another balance assessment that better demonstrated pt's safety issues and limitations. Pt scored 13/28 on Mini-BesTest, compared to 24/28 of age matched norms. Testing also presented guarded hesitancy with head turns due to fears of imbalance as well as some tendencies that could be related to residual Rt hemineglect. Will conitnue to address specific balance difficulties and work toward goals of treatment to maximize safety and independence in basic mobility needed for ADL and IADL performance.    Personal Factors and Comorbidities Comorbidity 1;Comorbidity 2;Comorbidity 3+;Sex;Age    Comorbidities HTN, DMII, dysphasia, aphasia, weakness,    Examination-Activity Limitations Locomotion Level;Stairs;Dressing;Bathing;Reach Overhead;Lift;Caring for Others    Examination-Participation Restrictions Driving;Medication Management;Personal Finances;Cleaning;Meal Prep;Yard Work;Community Activity;Shop;Other;Laundry    Stability/Clinical Decision Making Evolving/Moderate complexity    Clinical Decision Making Moderate    Rehab Potential Good    PT Frequency 2x / week    PT Duration 12 weeks    PT Treatment/Interventions ADLs/Self Care Home Management;Biofeedback;Canalith  Repostioning;Aquatic Therapy;Cryotherapy;Moist Heat;DME Instruction;Gait training;Stair training;Functional mobility training;Therapeutic activities;Therapeutic exercise;Balance training;Neuromuscular re-education;Cognitive remediation;Patient/family education;Orthotic Fit/Training;Manual techniques;Passive range of motion;Energy conservation;Taping;Splinting;Vestibular;Visual/perceptual remediation/compensation;Joint Manipulations    PT Next Visit Plan strength, endurance, balance and gait interventions    PT Home Exercise Plan Access Code: [email or web address]  URL: [email or web address]    Consulted and Agree with Plan of Care Patient;Family member/caregiver    Family Member Consulted sister             Patient will benefit from skilled therapeutic intervention in order to improve the following deficits and impairments:  Abnormal gait, Decreased activity tolerance, Decreased endurance, Decreased range of motion, Decreased strength, Hypomobility, Improper body mechanics, Decreased balance, Decreased coordination, Decreased mobility, Difficulty walking, Impaired flexibility, Postural dysfunction, Impaired vision/preception, Impaired sensation  Visit Diagnosis: Muscle weakness (generalized)  Abnormality of gait and mobility  Difficulty in walking, not elsewhere classified  Unsteadiness on feet  Other abnormalities of gait and mobility     Problem List 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) 06/02/2021   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 (CVA) 06/02/2021   Vitamin D deficiency 06/02/2021   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) 06/02/2021   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   Type II diabetes mellitus with complication (HCC)    Vitamin B12 deficiency    Essential hypertension 05/23/2021   5:31 PM, 07/22/21 Rosamaria Lints, PT, DPT Physical Therapist - [facility]  Outpatient Physical Therapy- Main Campus [phone]     [place] 07/22/2021, 5:29 PM  [facility] MAIN [facility] SERVICES [address] [place], [place], [address] Phone: [phone]   Fax:  [phone]  Name: Laura [name] MRN: [id number] Date of Birth: 1951/12/02

## 2021-07-22 NOTE — Therapy (Signed)
[place] [facility]IN [facility] [address] [place], [place], [address] Phone: [phone]   Fax:  [phone]  Occupational Therapy Treatment  Patient Details  Name: Laura [name] MRN: [id number] Date of Birth: 07-22-1951 No data recorded  Encounter Date: 07/22/2021   OT End of Session - 07/22/21 1822     Visit Number 5    Number of Visits 24    Date for OT Re-Evaluation 10/04/21    OT Start Time 1515    OT Stop Time 1600    OT Time Calculation (min) 45 min    Activity Tolerance Patient tolerated treatment well    Behavior During Therapy [facility] for tasks assessed/performed             Past Medical History:  Diagnosis Date   Acute CVA (cerebrovascular accident) (HCC) 05/24/2021   Left forearm fracture    in high school--was casted   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   Subjective Assessment - 07/22/21 1821     Subjective  Pt. brought in samplaes of writing, and coloring projects.    Patient is accompanied by: Family member    Pertinent History Per chart, Laura GurneyWanda [name] is a 70 y.o. female in relatively good health but no medical care for years who was [facility] with garbled speech, right facial droop, vision changes and right upper committee weakness.  Per reports she started feeling weak on 06/14 but refused to come to the hospital.  MRI brain done revealing acute nonhemorrhagic infarct in left caudate head and anterior lentiform nucleus as well as chronic microvascular ischemia mildly advanced for age.  CTA head/neck was negative for AVM, aneurysm or stenosis.  She was noted to have low vitamin B12 level at 97 and was started on supplementation.  Stroke was felt to be due to small vessel disease and neurology recommended DAPT x3 weeks followed by aspirin  alone.  She continued to be limited by weakness with dysarthria, tachycardia with activity, balance deficits as well as motor planning deficits with ADLs.  CIR was recommended due to functional decline from 05-26-2021 to 06-02-2021.    Patient Stated Goals Pt would like to go home and be as independent as possible    Currently in Pain? No/denies            OT TREATMENT    Neuro muscular re-education:  [facility]. performed [facility] tasks using the Grooved pegboard. Pt. worked on grasping the grooved pegs from a horizontal position, and moving the pegs to a vertical position in the hand to prepare for placing them in the grooved slot.    Therapeutic Exercise:  Pt. performed gross gripping with a gross grip strengthener. Pt. worked on sustaining grip while grasping pegs and reaching at various heights. The gripper was set to 17.9# of grip strength resistance. Pt. Worked on pinch strengthening in the left hand for lateral, and 3pt. pinch using yellow, red, green, and blue resistive clips. Pt. worked on placing the clips at various vertical and horizontal angles. Tactile and verbal cues were required for eliciting the desired movement.   Pt. Is making progress with right hand strength, and hand function skills. Pt.'s daughter reports that pt. Is writing more, and home, and using her coloring photos in her  coloring book. Pt. tolerated 11.9# grip strength, however required several rest breaks, and the container to be placed flat on the tabletop to th right of the pegboard 3/4 of the way through the task.  Pt. was able to to grasp the grooved pegs, however at times had difficulty turning the pegs in her fingers prior to placing them in the correct grooved peg direction. When performing translatory movements, Pt. had difficulty moving the pegs through her hand to the very tip of her 2nd digit, and thumb. Pt. Continues to work on improving, and increasing engagement of her right hand during daily ADL,a nd IADL tasks.                          OT Education - 07/22/21 1822     Education Details HEP    Person(s) Educated Patient;Caregiver(s)    Methods Explanation;Verbal cues;Handout    Comprehension Verbalized understanding;Need further instruction;Verbal cues required                 OT Long Term Goals - 07/12/21 1106       OT LONG TERM GOAL #1   Title Pt will be independent with home exercise program.    Baseline no current program    Time 12    Period Weeks    Status New    Target Date 10/04/21      OT LONG TERM GOAL #2   Title Pt will improve FOTO score to 73 or above to demonstrate a clinically relevant change to impact greater independence in necessary daily tasks.    Baseline Eval score of 61    Time 12    Period Weeks    Status New    Target Date 10/04/21      OT LONG TERM GOAL #3   Title Pt will complete light meal prep with modified independence.    Baseline requires assist from family    Time 12    Period Weeks    Status New    Target Date 10/04/21      OT LONG TERM GOAL #4   Title Pt will complete laundry tasks with modified independence    Baseline Eval: requires assist from family    Time 12    Period Weeks    Status New    Target Date 10/04/21      OT LONG TERM GOAL #5   Title Pt will complete check writing with modified independence.    Baseline eval:  difficulty    Time 6    Period Weeks    Status New    Target Date 08/23/21      Long Term Additional Goals   Additional Long Term Goals --      OT LONG TERM GOAL #6   Title Pt will demonstrate handwriting with legibility 80% or greater to write demographics and signature  on important paper.    Baseline EvalL difficulty with poor legibility    Time 6    Period Weeks    Status New    Target Date 08/23/21      OT LONG TERM GOAL #7   Title Pt will complete basic self care tasks with modified independence.    Baseline increased time and occasional assist    Time 6    Period Weeks     Status New    Target Date 08/23/21      OT LONG TERM GOAL #8   Title  Pt to improve coordination by reduction of time on 9 hole peg test by 10 secs to assist with performing coordination tasks with writing, buttons, zippers.    Baseline Eval: right 52 secs    Time 12    Period Weeks    Status New    Target Date 10/04/21      OT LONG TERM GOAL  #9   TITLE Pt will improve right grip strength by 10# to open jars, containers with modified independence.    Baseline Eval: right 28#    Time 12    Period Weeks    Status New    Target Date 10/04/21                   Plan - 07/22/21 1823     Clinical Impression Statement Pt. Is making progress with right hand strength, and hand function skills. Pt.'s daughter reports that pt. Is writing more, and home, and using her coloring photos in her coloring book. Pt. tolerated 11.9# grip strength, however required several rest breaks, and the container to be placed flat on the tabletop to th right of the pegboard 3/4 of the way through the task.  Pt. was able to to grasp the grooved pegs, however at times had difficulty turning the pegs in her fingers prior to placing them in the correct grooved peg direction. When performing translatory movements, Pt. had difficulty moving the pegs through her hand to the very tip of her 2nd digit, and thumb. Pt. Continues to work on improving, and increasing engagement of her right hand during daily ADL,a nd IADL tasks.        OT Occupational Profile and History Detailed Assessment- Review of Records and additional review of physical, cognitive, psychosocial history related to current functional performance    Body Structure / Function / Physical Skills ADL;Dexterity;Flexibility;Strength;Balance;Coordination;FMC;IADL;UE functional use;Mobility    Psychosocial Skills Environmental  Adaptations;Habits;Routines and Behaviors    Rehab Potential Good    Clinical Decision Making Limited treatment options, no task  modification necessary    Comorbidities Affecting Occupational Performance: May have comorbidities impacting occupational performance    Modification or Assistance to Complete Evaluation  No modification of tasks or assist necessary to complete eval    OT Frequency 2x / week    OT Duration 12 weeks    OT Treatment/Interventions Self-care/ADL training;Cryotherapy;Therapeutic exercise;DME and/or AE instruction;Functional Mobility Training;Cognitive remediation/compensation;Balance training;Neuromuscular education;Manual Therapy;Splinting;Visual/perceptual remediation/compensation;Moist Heat;Contrast Bath;Therapeutic activities;Patient/family education    Consulted and Agree with Plan of Care Patient;Family member/caregiver    Family Member Consulted Sister             Patient will benefit from skilled therapeutic intervention in order to improve the following deficits and impairments:   Body Structure / Function / Physical Skills: ADL, Dexterity, Flexibility, Strength, Balance, Coordination, FMC, IADL, UE functional use, Mobility   Psychosocial Skills: Environmental  Adaptations, Habits, Routines and Behaviors   Visit Diagnosis: Muscle weakness (generalized)  Other lack of coordination    Problem List 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) 06/02/2021   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 (CVA) 06/02/2021   Vitamin D deficiency 06/02/2021   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

## 2021-07-26 ENCOUNTER — Encounter: Payer: Self-pay | Admitting: Occupational Therapy

## 2021-07-26 ENCOUNTER — Ambulatory Visit: Payer: Medicare Other | Admitting: Occupational Therapy

## 2021-07-26 ENCOUNTER — Ambulatory Visit (INDEPENDENT_AMBULATORY_CARE_PROVIDER_SITE_OTHER): Payer: Medicare Other | Admitting: Internal Medicine

## 2021-07-26 ENCOUNTER — Ambulatory Visit: Payer: Medicare Other

## 2021-07-26 ENCOUNTER — Encounter: Payer: Self-pay | Admitting: Internal Medicine

## 2021-07-26 ENCOUNTER — Other Ambulatory Visit: Payer: Self-pay

## 2021-07-26 VITALS — BP 124/70 | HR 91 | Temp 98.2°F | Ht 68.0 in | Wt 153.0 lb

## 2021-07-26 DIAGNOSIS — H9190 Unspecified hearing loss, unspecified ear: Secondary | ICD-10-CM

## 2021-07-26 DIAGNOSIS — E1169 Type 2 diabetes mellitus with other specified complication: Secondary | ICD-10-CM | POA: Diagnosis not present

## 2021-07-26 DIAGNOSIS — R2689 Other abnormalities of gait and mobility: Secondary | ICD-10-CM | POA: Diagnosis not present

## 2021-07-26 DIAGNOSIS — E785 Hyperlipidemia, unspecified: Secondary | ICD-10-CM

## 2021-07-26 DIAGNOSIS — R269 Unspecified abnormalities of gait and mobility: Secondary | ICD-10-CM

## 2021-07-26 DIAGNOSIS — I10 Essential (primary) hypertension: Secondary | ICD-10-CM

## 2021-07-26 DIAGNOSIS — R262 Difficulty in walking, not elsewhere classified: Secondary | ICD-10-CM

## 2021-07-26 DIAGNOSIS — E118 Type 2 diabetes mellitus with unspecified complications: Secondary | ICD-10-CM

## 2021-07-26 DIAGNOSIS — Z1159 Encounter for screening for other viral diseases: Secondary | ICD-10-CM

## 2021-07-26 DIAGNOSIS — Z23 Encounter for immunization: Secondary | ICD-10-CM

## 2021-07-26 DIAGNOSIS — M6281 Muscle weakness (generalized): Secondary | ICD-10-CM

## 2021-07-26 DIAGNOSIS — I6300 Cerebral infarction due to thrombosis of unspecified precerebral artery: Secondary | ICD-10-CM

## 2021-07-26 DIAGNOSIS — R278 Other lack of coordination: Secondary | ICD-10-CM

## 2021-07-26 LAB — POCT UA - MICROALBUMIN: Microalbumin Ur, POC: 20 mg/L

## 2021-07-26 NOTE — Therapy (Signed)
[place] [facility] MAIN [facility] SERVICES [address] [place], [place], [address] Phone: [phone]   Fax:  [phone]  Physical Therapy Treatment  Patient Details  Name: Laura [name] MRN: [id number] Date of Birth: [birth year] Referring Provider (PT): Carlis Abbott Drema Pry, MD   Encounter Date: 07/26/2021   PT End of Session - 07/26/21 1106     Visit Number 6    Number of Visits 25    Date for PT Re-Evaluation 09/27/21    Authorization Type Medicare (traditional)    Authorization Time Period 07/05/21-09/27/21    Progress Note Due on Visit 10    PT Start Time 1102    Equipment Utilized During Treatment Gait belt    Activity Tolerance Patient tolerated treatment well;No increased pain    Behavior During Therapy [facility] for tasks assessed/performed             Past Medical History:  Diagnosis Date   Acute CVA (cerebrovascular accident) (HCC) 05/24/2021   Left forearm fracture    in high school--was casted   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   Subjective Assessment - 07/26/21 1105     Pertinent History Pt presents to evaluation with her sister as pt has difficulty with speech. Pt reports she has not been using an AD. Pt confirms she was admitted to hospital d/t CVA on 05/23/2021. Pt sister reports they are unsure of the onset of the stroke. Pt's sister reports she checked on her sister after she received a birthday card and noticed her sister's signature looked abnormal. She called the pt and reports her voice was slurred. When she checked on her she said she had R side facial droop, difficulty with R hand. Pt's sister reports pt initially declined going to the ED but agreed to go the following morning where she was diagnosed with a CVA. Once d/c from the hospital the pt went to rehab for a  week. Pt was living alone, independently prior to her stroke, but now lives with her sister and her sister's husband. Pt's sister says her husband walks with pt 15 mins 2x/day. Pt's sister reports after pt has been walking for a while she will start to drag her L leg. She says this has improved with time. Pt now requires some assistance with ADLs, including dressing, making her bed, and self-feeding, showering. Pt's sister reports pt now has vision issues as well as balance problems. She reports pt loses balance with dressing herself. Other PMH includes: HTN, DMII, Vitamin b12 deficiency, HLD, Vitamin D deficiency. Pt also with dysphasia, aphasia, weakness d/t recent stroke.    Currently in Pain? No/denies             Interventions:  Neuromuscular re-education:  Tandem standing on 1/2 foam (curved end up) without UE support x 20 sec x 3 trials each LE. Patient reports as medium with mild difficulty initially but improved each trial Tandem (curved side down) x 15-20 sec x 3 each LE.   Cone activities- weaving in/out around cones (6)  forward then backward x 3 times.  Forward/diagonal/backward/side step - negotiating around 6 cones x 3 trials. (Patient presents with more difficulty with retro gait with shuffling short steps)    Static Standing on firm surface with eyes open and dynamic head  turns- no LOB so progressed to:    Standing on blue airex pad with feet narrowed then tandem focusing on head turns- multiple attempts  with initial difficulty but improved with practice.    Education provided throughout session via VC/TC and demonstration to facilitate movement at target joints and correct muscle activation for all testing and exercises performed.   Clinical Impression: Patient challenged overall today with dynamic balance activities involving tandem standing, cone activities and head turning. Initial unsteadiness with all activities but improved overall with practice. The pt will benefit  from further skilled PT to improve BLE strength, endurance, balance and gait in order to return pt to PLOF                            PT Short Term Goals - 07/05/21 3557       PT SHORT TERM GOAL #1   Title Patient will be independent in home exercise program to improve strength/mobility for better functional independence with ADLs.    Baseline 07/05/2021: to be initiated next visit    Time 6    Status New    Target Date 08/16/21               PT Long Term Goals - 07/22/21 1726       PT LONG TERM GOAL #1   Title Patient will increase FOTO score to equal to or greater than 79 to demonstrate statistically significant improvement in mobility and quality of life.    Baseline 8/1: 70    Time 12    Period Weeks    Status On-going    Target Date 09/27/21      PT LONG TERM GOAL #2   Title Patient will increase Berg Balance score by > 6 points to demonstrate decreased fall risk during functional activities.    Baseline 8/1: 46/56    Time 12    Period Weeks    Status On-going    Target Date 09/27/21      PT LONG TERM GOAL #3   Title The pt will ambulate at least 1700 ft during to demonstrate return toward age norm gait ability.    Baseline 8/1: 1398 ft no AD, CGA    Time 12    Period Weeks    Status On-going    Target Date 09/27/21      PT LONG TERM GOAL #4   Title Pt to demonstrate improvement in Mini-BesTest score to 22/28 or higher to represent improved balance and reduced falls risk.    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

## 2021-07-26 NOTE — Therapy (Signed)
[facility] [address] [place], [place], [address] Phone: [phone]   Fax:  [phone]  Occupational Therapy Treatment  Patient Details  Name: Laura GurneyWanda Myler MRN: [id number] Date of Birth: 1951-10-11 No data recorded  Encounter Date: 07/26/2021   OT End of Session - 07/26/21 1650     Visit Number 7    Number of Visits 24    Date for OT Re-Evaluation 10/04/21    OT Start Time 1020    OT Stop Time 1100    OT Time Calculation (min) 40 min    Activity Tolerance Patient tolerated treatment well    Behavior During Therapy [facility] for tasks assessed/performed             Past Medical History:  Diagnosis Date   Acute CVA (cerebrovascular accident) (HCC) 05/24/2021   Left forearm fracture    in high school--was casted   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   Subjective Assessment - 07/26/21 1638     Subjective  Pt. was present with her sister    Patient is accompanied by: Family member    Pertinent History Per chart, Laura GurneyWanda Tobey is a 70 y.o. female in relatively good health but no medical care for years who was [facility] with garbled speech, right facial droop, vision changes and right upper committee weakness.  Per reports she started feeling weak on 06/14 but refused to come to the hospital.  MRI brain done revealing acute nonhemorrhagic infarct in left caudate head and anterior lentiform nucleus as well as chronic microvascular ischemia mildly advanced for age.  CTA head/neck was negative for AVM, aneurysm or stenosis.  She was noted to have low vitamin B12 level at 97 and was started on supplementation.  Stroke was felt to be due to small vessel disease and neurology recommended DAPT x3 weeks followed by aspirin alone.  She continued to be  limited by weakness with dysarthria, tachycardia with activity, balance deficits as well as motor planning deficits with ADLs.  CIR was recommended due to functional decline from 05-26-2021 to 06-02-2021.    Currently in Pain? No/denies            OT TREATMENT    Neuro muscular re-education:  Pt. performed [facility] tasks using the Grooved pegboard. Pt. worked on grasping the grooved pegs from a horizontal position, performing translatory skills moving them through her palm to the tip of her second digit, and thumb to prepare for placing them in the grooved slots.  Therapeutic Exercise:  Pt. performed gross gripping with a gross grip strengthener. Pt. worked on sustaining grip while grasping pegs and reaching at various heights. The gripper was set to  17.9# of grip strength resistance. Pt. performed resistive EZ Board exercises for forearm supination/pronation, wrist flexion/extension using gross grasp, and lateral pinch (key) grasp. Pt. performed resistive EZ Board exercises angled in several planes to promote shoulder flexion, abduction, and wrist flexion, and extension while performing resistive wrist flexion and extension with a gross grip. Pt. worked on grasping 1" resistive cubes alternating thumb opposition to the tip of the 2nd digit while the board is placed at a vertical angle.   Pt. is making progress and has improved with right hand [facility] skills, and translatory  movements of the hand. Pt. presented with more isolated thumb movements when moving the grooved pegs through her hand from the palm to the tip of her 2nd digit and thumb. Pt. was able to clear the board, and sustain gross grip resistance while reaching to place them into th container. Pt. Continues to require cues for movement patterns, and digit placement on the cubes when removing them from the resistive velcro. Pt. Continues to work on improving right hand function, and [facility] skills in order to increase engagement of the RUE during  ADLS, and IADLs.                            OT Education - 07/26/21 1650     Education Details HEP    Person(s) Educated Patient;Caregiver(s)    Methods Explanation;Verbal cues;Handout    Comprehension Verbalized understanding;Need further instruction;Verbal cues required                 OT Long Term Goals - 07/12/21 1106       OT LONG TERM GOAL #1   Title Pt will be independent with home exercise program.    Baseline no current program    Time 12    Period Weeks    Status New    Target Date 10/04/21      OT LONG TERM GOAL #2   Title Pt will improve FOTO score to 73 or above to demonstrate a clinically relevant change to impact greater independence in necessary daily tasks.    Baseline Eval score of 61    Time 12    Period Weeks    Status New    Target Date 10/04/21      OT LONG TERM GOAL #3   Title Pt will complete light meal prep with modified independence.    Baseline requires assist from family    Time 12    Period Weeks    Status New    Target Date 10/04/21      OT LONG TERM GOAL #4   Title Pt will complete laundry tasks with modified independence    Baseline Eval: requires assist from family    Time 12    Period Weeks    Status New    Target Date 10/04/21      OT LONG TERM GOAL #5   Title Pt will complete check writing with modified independence.    Baseline eval:  difficulty    Time 6    Period Weeks    Status New    Target Date 08/23/21      Long Term Additional Goals   Additional Long Term Goals --      OT LONG TERM GOAL #6   Title Pt will demonstrate handwriting with legibility 80% or greater to write demographics and signature  on important paper.    Baseline EvalL difficulty with poor legibility    Time 6    Period Weeks    Status New    Target Date 08/23/21      OT LONG TERM GOAL #7   Title Pt will complete basic self care tasks with modified independence.    Baseline increased time and occasional assist     Time 6    Period Weeks    Status New    Target Date 08/23/21      OT LONG TERM GOAL #8   Title Pt to improve coordination by reduction of time on  9 hole peg test by 10 secs to assist with performing coordination tasks with writing, buttons, zippers.    Baseline Eval: right 52 secs    Time 12    Period Weeks    Status New    Target Date 10/04/21      OT LONG TERM GOAL  #9   TITLE Pt will improve right grip strength by 10# to open jars, containers with modified independence.    Baseline Eval: right 28#    Time 12    Period Weeks    Status New    Target Date 10/04/21                   Plan - 07/26/21 1649     Clinical Impression Statement Pt. is making progress and has improved with right hand [facility] skills, and translatory movements of the hand. Pt. presented with more isolated thumb movements when moving the grooved pegs through her hand from the palm to the tip of her 2nd digit and thumb. Pt. was able to clear the board, and sustain gross grip resistance while reaching to place them into th container. Pt. Continues to require cues for movement patterns, and digit placement on the cubes when removing them from the resistive velcro. Pt. Continues to work on improving right hand function, and [facility] skills in order to increase engagement of the RUE during ADLS, and IADLs.    OT Occupational Profile and History Detailed Assessment- Review of Records and additional review of physical, cognitive, psychosocial history related to current functional performance    Occupational performance deficits (Please refer to evaluation for details): ADL's;IADL's;Leisure    Body Structure / Function / Physical Skills ADL;Dexterity;Flexibility;Strength;Balance;Coordination;FMC;IADL;UE functional use;Mobility    Psychosocial Skills Environmental  Adaptations;Habits;Routines and Behaviors    Rehab Potential Good    Clinical Decision Making Limited treatment options, no task modification necessary     Comorbidities Affecting Occupational Performance: May have comorbidities impacting occupational performance    Modification or Assistance to Complete Evaluation  No modification of tasks or assist necessary to complete eval    OT Frequency 2x / week    OT Duration 12 weeks    OT Treatment/Interventions Self-care/ADL training;Cryotherapy;Therapeutic exercise;DME and/or AE instruction;Functional Mobility Training;Cognitive remediation/compensation;Balance training;Neuromuscular education;Manual Therapy;Splinting;Visual/perceptual remediation/compensation;Moist Heat;Contrast Bath;Therapeutic activities;Patient/family education    Consulted and Agree with Plan of Care Patient;Family member/caregiver    Family Member Consulted Sister             Patient will benefit from skilled therapeutic intervention in order to improve the following deficits and impairments:   Body Structure / Function / Physical Skills: ADL, Dexterity, Flexibility, Strength, Balance, Coordination, FMC, IADL, UE functional use, Mobility   Psychosocial Skills: Environmental  Adaptations, Habits, Routines and Behaviors   Visit Diagnosis: Muscle weakness (generalized)    Problem List 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 [place], [place], [address] Phone:  [phone]   Fax:  [phone]  Name: Annalaya Wile MRN: [id number] Date of Birth: [birth year]

## 2021-07-26 NOTE — Progress Notes (Signed)
Date:  07/26/2021   Name:  Laura [name]   DOB:  1951/11/08   MRN:  [id number]  Here today with her sister [name].  Chief Complaint: Hypertension and Diabetes (Last BS this morning 174)  Diabetes She presents for her follow-up diabetic visit. She has type 2 diabetes mellitus. The initial diagnosis of diabetes was made 6 months ago. Her disease course has been improving. Pertinent negatives for hypoglycemia include no headaches or tremors. Pertinent negatives for diabetes include no chest pain, no fatigue, no polydipsia and no polyuria. Diabetic complications include a CVA. Current diabetic treatment includes insulin injections (24 units daily). Her weight is stable. Her breakfast blood glucose is taken between 6-7 am. Her breakfast blood glucose range is generally 140-180 mg/dl. Her bedtime blood glucose is taken between 9-10 pm. Her bedtime blood glucose range is generally >200 mg/dl. An ACE inhibitor/angiotensin II receptor blocker is not being taken.  Hypertension This is a chronic problem. The problem is controlled. Pertinent negatives include no chest pain, headaches, palpitations or shortness of breath. Past treatments include calcium channel blockers. The current treatment provides significant improvement. Hypertensive end-organ damage includes CVA.  Hyperlipidemia This is a chronic problem. Pertinent negatives include no chest pain or shortness of breath. Current antihyperlipidemic treatment includes statins (started three months ago).   Lab Results  Component Value Date   CREATININE 0.93 05/31/2021   BUN 19 05/31/2021   NA 136 05/31/2021   K 4.3 05/31/2021   CL 102 05/31/2021   CO2 27 05/31/2021   Lab Results  Component Value Date   CHOL 192 05/24/2021   HDL 27 (L) 05/24/2021   LDLCALC 124 (H) 05/24/2021   TRIG 203 (H) 05/24/2021   CHOLHDL 7.1 05/24/2021   Lab Results  Component Value Date   TSH 3.160 05/23/2021   Lab Results  Component Value Date   HGBA1C 12.0 (H)  05/23/2021   Lab Results  Component Value Date   WBC 6.9 05/31/2021   HGB 13.4 05/31/2021   HCT 40.3 05/31/2021   MCV 86.1 05/31/2021   PLT 293 05/31/2021   Lab Results  Component Value Date   ALT 21 05/27/2021   AST 20 05/27/2021   ALKPHOS 61 05/27/2021   BILITOT 0.6 05/27/2021     Review of Systems  Constitutional:  Negative for appetite change, fatigue, fever and unexpected weight change.  HENT:  Negative for tinnitus and trouble swallowing.   Eyes:  Negative for visual disturbance.  Respiratory:  Negative for cough, chest tightness and shortness of breath.   Cardiovascular:  Negative for chest pain, palpitations and leg swelling.  Gastrointestinal:  Negative for abdominal pain.  Endocrine: Negative for polydipsia and polyuria.  Genitourinary:  Negative for dysuria and hematuria.  Musculoskeletal:  Negative for arthralgias.  Neurological:  Negative for tremors, numbness and headaches.  Psychiatric/Behavioral:  Negative for dysphoric mood.    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  Current Meds  Medication Sig   Accu-Chek Softclix Lancets lancets Use twice a day   amLODipine (NORVASC) 5 MG tablet Take 1 tablet (5 mg total) by mouth daily.   aspirin 81 MG EC tablet Take 1 tablet (81 mg total) by mouth daily. Swallow whole.   atorvastatin (LIPITOR) 80 MG tablet Take 1 tablet (80 mg total) by mouth every evening.   cyanocobalamin 1000 MCG tablet Take 1 tablet (1,000 mcg total) by mouth daily.   glucose blood (ACCU-CHEK GUIDE) test strip Test blood sugar twice a day   Insulin Glargine (BASAGLAR KWIKPEN) 100 UNIT/ML Inject 24 Units into the skin daily.   Insulin Pen Needle (COMFORT TOUCH INSULIN PEN NEED) 32G X 6 MM MISC 1 application by Does not apply route daily.   polyethylene glycol (MIRALAX / GLYCOLAX) 17 g packet Take 17 g by mouth daily as needed for moderate constipation.    PHQ 2/9 Scores 07/26/2021 06/02/2021  PHQ - 2 Score 0 6  PHQ- 9 Score 0 17    GAD 7 : Generalized Anxiety Score 07/26/2021 06/02/2021  Nervous, Anxious, on Edge 0 3  Control/stop worrying 0 3  Worry too much - different things 0 2  Trouble relaxing 0 2  Restless 0 0  Easily annoyed or irritable 0 0  Afraid - awful might happen 0 1  Total GAD 7 Score 0 11  Anxiety Difficulty - Very difficult    BP Readings from Last 3 Encounters:  07/26/21 124/70  06/02/21 124/70  06/02/21 (!) 154/72    Physical Exam Vitals and nursing note reviewed.  Constitutional:      General: She is not in acute distress.    Appearance: She is well-developed.  HENT:     Head: Normocephalic and atraumatic.  Neck:     Vascular: No carotid bruit.  Cardiovascular:     Rate and Rhythm: Normal rate and regular rhythm.     Pulses: Normal pulses.     Heart sounds: No murmur heard. Pulmonary:     Effort: Pulmonary effort is normal. No respiratory distress.     Breath sounds: No wheezing or rhonchi.  Musculoskeletal:        General: Normal range of motion.     Cervical back: Normal range of  motion.     Right lower leg: No edema.     Left lower leg: No edema.  Lymphadenopathy:     Cervical: No cervical adenopathy.  Skin:    General: Skin is warm and dry.     Capillary Refill: Capillary refill takes less than 2 seconds.     Findings: No rash.  Neurological:     Mental Status: She is alert and oriented to person, place, and time. Mental status is at baseline.     Gait: Gait abnormal (mild balance issues/speech is limited).     Comments: Speech is sparse but appropriate  Psychiatric:        Mood and Affect: Mood normal.        Behavior: Behavior normal.    Wt Readings  from Last 3 Encounters:  07/26/21 153 lb (69.4 kg)  06/02/21 159 lb 9.6 oz (72.4 kg)  05/26/21 157 lb 13.6 oz (71.6 kg)    BP 124/70   Pulse 91   Temp 98.2 F (36.8 C) (Oral)   Ht 5\' 8"  (1.727 m)   Wt 153 lb (69.4 kg)   SpO2 97%   BMI 23.26 kg/m   Assessment and Plan: 1. Type II diabetes mellitus with complication (HCC) Doing well on insulin once a day but glucoses are still high Will get labs and consider adding metformin if A1C is improved Continue BID finger stick BS and record - POCT UA - Microalbumin - Comprehensive metabolic panel - Hemoglobin A1c  2. Essential hypertension Clinically stable exam with well controlled BP. Tolerating medications without side effects at this time. Pt to continue current regimen and low sodium diet; benefits of regular exercise as able discussed. - Comprehensive metabolic panel  3. Hyperlipidemia associated with type 2 diabetes mellitus (HCC) Doing well on Statin - no labs done since initiation - Comprehensive metabolic panel - Lipid panel  4. Need for hepatitis C screening test - Hepatitis C antibody  5. Decreased hearing, unspecified laterality ST feels that hearing loss may be impacting her therapy Sister requests referral - Ambulatory referral to Audiology  6. Need for vaccination for pneumococcus - Pneumococcal conjugate vaccine  20-valent   Partially dictated using . Any errors are unintentional.  Animal nutritionist, MD [facility]  07/26/2021

## 2021-07-27 LAB — COMPREHENSIVE METABOLIC PANEL
ALT: 35 IU/L — ABNORMAL HIGH (ref 0–32)
AST: 22 IU/L (ref 0–40)
Albumin/Globulin Ratio: 1.7 (ref 1.2–2.2)
Albumin: 4.4 g/dL (ref 3.8–4.8)
Alkaline Phosphatase: 83 IU/L (ref 44–121)
BUN/Creatinine Ratio: 22 (ref 12–28)
BUN: 22 mg/dL (ref 8–27)
Bilirubin Total: 0.3 mg/dL (ref 0.0–1.2)
CO2: 23 mmol/L (ref 20–29)
Calcium: 9.8 mg/dL (ref 8.7–10.3)
Chloride: 99 mmol/L (ref 96–106)
Creatinine, Ser: 1 mg/dL (ref 0.57–1.00)
Globulin, Total: 2.6 g/dL (ref 1.5–4.5)
Glucose: 187 mg/dL — ABNORMAL HIGH (ref 65–99)
Potassium: 4.4 mmol/L (ref 3.5–5.2)
Sodium: 139 mmol/L (ref 134–144)
Total Protein: 7 g/dL (ref 6.0–8.5)
eGFR: 61 mL/min/{1.73_m2} (ref >59)

## 2021-07-27 LAB — LIPID PANEL
Chol/HDL Ratio: 2.9 ratio (ref 0.0–4.4)
Cholesterol, Total: 79 mg/dL — ABNORMAL LOW (ref 100–199)
HDL: 27 mg/dL — ABNORMAL LOW (ref >39)
LDL Chol Calc (NIH): 36 mg/dL (ref 0–99)
Triglycerides: 77 mg/dL (ref 0–149)
VLDL Cholesterol Cal: 16 mg/dL (ref 5–40)

## 2021-07-27 LAB — HEPATITIS C ANTIBODY: Hep C Virus Ab: <0.1 s/co ratio (ref 0.0–0.9)

## 2021-07-27 LAB — HEMOGLOBIN A1C
Est. average glucose Bld gHb Est-mCnc: 203 mg/dL
Hgb A1c MFr Bld: 8.7 % — ABNORMAL HIGH (ref 4.8–5.6)

## 2021-07-28 ENCOUNTER — Telehealth: Payer: Self-pay

## 2021-07-28 ENCOUNTER — Other Ambulatory Visit: Payer: Self-pay | Admitting: Internal Medicine

## 2021-07-28 DIAGNOSIS — E118 Type 2 diabetes mellitus with unspecified complications: Secondary | ICD-10-CM

## 2021-07-28 MED ORDER — METFORMIN HCL ER 500 MG PO TB24: 500.0000 mg | ORAL_TABLET | Freq: Two times a day (BID) | ORAL | 1 refills | Status: DC

## 2021-07-28 NOTE — Progress Notes (Signed)
Called pt left VM to call back. Pt viewed labs on Mychart. Need to know if pt would like to start taking Metformin which was recommended by Dr. Judithann Graves.  [facility] nurse may give results to patient if they return call to clinic, a CRM has been created.  KP

## 2021-07-28 NOTE — Telephone Encounter (Signed)
Copied from CRM [id number]. Topic: General - Other >> Jul 28, 2021  9:24 AM Traci Sermon wrote: Reason for CRM: Pt sister called in returning a call back from [place], please advise.

## 2021-07-28 NOTE — Progress Notes (Signed)
Informed pt with labs. Pts wants to start taking metformin ([facility], [place])  KP

## 2021-07-28 NOTE — Telephone Encounter (Signed)
Informed pt with labs.   KP

## 2021-07-29 ENCOUNTER — Ambulatory Visit: Payer: Medicare Other | Admitting: Occupational Therapy

## 2021-07-29 ENCOUNTER — Ambulatory Visit: Payer: Medicare Other

## 2021-07-29 ENCOUNTER — Ambulatory Visit: Payer: Medicare Other | Admitting: Physical Therapy

## 2021-07-29 ENCOUNTER — Ambulatory Visit: Payer: Medicare Other | Admitting: Speech Pathology

## 2021-07-29 ENCOUNTER — Encounter: Payer: Self-pay | Admitting: Occupational Therapy

## 2021-07-29 ENCOUNTER — Encounter: Payer: Medicare Other | Admitting: Occupational Therapy

## 2021-07-29 ENCOUNTER — Encounter: Payer: Self-pay | Admitting: Physical Therapy

## 2021-07-29 ENCOUNTER — Other Ambulatory Visit: Payer: Self-pay

## 2021-07-29 DIAGNOSIS — R2689 Other abnormalities of gait and mobility: Secondary | ICD-10-CM | POA: Diagnosis not present

## 2021-07-29 DIAGNOSIS — M6281 Muscle weakness (generalized): Secondary | ICD-10-CM

## 2021-07-29 DIAGNOSIS — R278 Other lack of coordination: Secondary | ICD-10-CM

## 2021-07-29 DIAGNOSIS — R471 Dysarthria and anarthria: Secondary | ICD-10-CM

## 2021-07-29 DIAGNOSIS — R4701 Aphasia: Secondary | ICD-10-CM

## 2021-07-29 DIAGNOSIS — R262 Difficulty in walking, not elsewhere classified: Secondary | ICD-10-CM

## 2021-07-29 DIAGNOSIS — R269 Unspecified abnormalities of gait and mobility: Secondary | ICD-10-CM

## 2021-07-29 DIAGNOSIS — R41841 Cognitive communication deficit: Secondary | ICD-10-CM

## 2021-07-29 DIAGNOSIS — R2681 Unsteadiness on feet: Secondary | ICD-10-CM

## 2021-07-29 NOTE — Therapy (Signed)
[facility] MAIN [facility] SERVICES [address] [place], [place], [address] Phone: [phone]   Fax:  [phone]  Physical Therapy Treatment  Patient Details  Name: Laura [name] MRN: [id number] Date of Birth: 12/17/1950 Referring Provider (PT): Carlis Abbott Drema Pry, MD   Encounter Date: 07/29/2021   PT End of Session - 07/29/21 1343     Visit Number 7    Number of Visits 25    Date for PT Re-Evaluation 09/27/21    Authorization Type Medicare (traditional)    Authorization Time Period 07/05/21-09/27/21    Progress Note Due on Visit 10    PT Start Time 1346    PT Stop Time 1430    PT Time Calculation (min) 44 min    Equipment Utilized During Treatment Gait belt    Activity Tolerance Patient tolerated treatment well;No increased pain    Behavior During Therapy [facility] for tasks assessed/performed             Past Medical History:  Diagnosis Date   Acute CVA (cerebrovascular accident) (HCC) 05/24/2021   Left forearm fracture    in high school--was casted   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   Subjective Assessment - 07/29/21 1350     Subjective Patient reports doing well; denies any pain; denies any new falls; reports adherence with HEP    Pertinent History Pt presents to evaluation with her sister as pt has difficulty with speech. Pt reports she has not been using an AD. Pt confirms she was admitted to hospital d/t CVA on 05/23/2021. Pt sister reports they are unsure of the onset of the stroke. Pt's sister reports she checked on her sister after she received a birthday card and noticed her sister's signature looked abnormal. She called the pt and reports her voice was slurred. When she checked on her she said she had R side facial droop, difficulty with R hand. Pt's sister reports pt  initially declined going to the ED but agreed to go the following morning where she was diagnosed with a CVA. Once d/c from the hospital the pt went to rehab for a week. Pt was living alone, independently prior to her stroke, but now lives with her sister and her sister's husband. Pt's sister says her husband walks with pt 15 mins 2x/day. Pt's sister reports after pt has been walking for a while she will start to drag her L leg. She says this has improved with time. Pt now requires some assistance with ADLs, including dressing, making her bed, and self-feeding, showering. Pt's sister reports pt now has vision issues as well as balance problems. She reports pt loses balance with dressing herself. Other PMH includes: HTN, DMII, Vitamin b12 deficiency, HLD, Vitamin D deficiency. Pt also with dysphasia, aphasia, weakness d/t recent stroke.    Currently in Pain? No/denies    Multiple Pain Sites No                     Interventions:  Neuromuscular re-education:  Standing on 1/2 bolster (flat side up): Forward, feet apart, heel/toe rock with rail assist x10 reps; Progressed to feet apart, neutral stance:  Unsupported 10 sec hold x2 reps with cues for hip and ankle strategies for balance recovery to avoid posterior loss of  balance;  Unsupported BUE shoulder abduction (angel wings) x10 reps to challenge stance control; Required CGA for safety with intermittent posterior loss of balance;  Tandem standing on 1/2 foam (flat end up) without UE support x 10 sec x 2 trials each LE. Had increased difficulty with left foot ahead of right foot requiring min A for safety;     Forward/backward step over orange hurdle x10 reps, unsupported, progressed after 3 reps with patient holding ball in both hands to challenge dynamic balance;  Side step over orange hurdle: Unsupported x3 reps each, progressed to dual task, holding ball in BUE with circles x7 reps each direction with mod VCS for sequencing and  increase step length for foot clearance;      Standing on airex pad: Alternate toe taps to 8 inch step (2nd step) x10 reps without rail assist, CGA for safety Standing one foot on airex, one foot on 8 inch step:  Unsupported: 15 sec hold, required CGA while standing on RLE  Unsupported with ball pass side/side x10 reps each with increased difficulty while standing on RLE requiring min A for safety;   Standing on incline at steps: Unsupported eyes open 30 sec hold, eyes closed 10 sec hold x2 reps requiring CGA to min A for balance recovery with eyes closed Unsupported head turns side/side x5 reps Unsupported head turns up/down with arms up/down x5 reps Required CGA for safety with occasional loss of balance;    Education provided throughout session via VC/TC and demonstration to facilitate movement at target joints and correct muscle activation for all testing and exercises performed.    Tolerated well. She reports mild fatigue at end of session;                  PT Education - 07/29/21 1343     Education Details balance/exercise technique;    Person(s) Educated Patient    Methods Explanation;Verbal cues    Comprehension Verbalized understanding;Returned demonstration;Verbal cues required;Need further instruction              PT Short Term Goals - 07/05/21 3875       PT SHORT TERM GOAL #1   Title Patient will be independent in home exercise program to improve strength/mobility for better functional independence with ADLs.    Baseline 07/05/2021: to be initiated next visit    Time 6    Status New    Target Date 08/16/21               PT Long Term Goals - 07/22/21 1726       PT LONG TERM GOAL #1   Title Patient will increase FOTO score to equal to or greater than 79 to demonstrate statistically significant improvement in mobility and quality of life.    Baseline 8/1: 70    Time 12    Period Weeks    Status On-going    Target Date 09/27/21      PT  LONG TERM GOAL #2   Title Patient will increase Berg Balance score by > 6 points to demonstrate decreased fall risk during functional activities.    Baseline 8/1: 46/56    Time 12    Period Weeks    Status On-going    Target Date 09/27/21      PT LONG TERM GOAL #3   Title The pt will ambulate at least 1700 ft during to demonstrate return toward age norm gait ability.    Baseline 8/1: 1398 ft no  AD, CGA    Time 12    Period Weeks    Status On-going    Target Date 09/27/21      PT LONG TERM GOAL #4   Title Pt to demonstrate improvement in Mini-BesTest score to 22/28 or higher to represent improved balance and reduced falls risk.    Baseline On 07/22/21: 15/28    Time 8    Period Weeks    Status New    Target Date 09/16/21                   Plan - 07/29/21 1429     Clinical Impression Statement Patient motivated and participated well within session. She was instructed in advanced balance tasks. Utilized compliant surface to challenge ankle strategies and stance control. Patient does exhibit increased posterior loss of balance requiring cues for forward weight shift and ankle strategies to improve balance recovery. She required CGA to min A for safety. She was also instructed in dual task activities this session. Patient had a harder time negotiating obstacles with dual task. She would benefit from additional skilled PT intervention to improve strength, balance and mobility;    Personal Factors and Comorbidities Comorbidity 1;Comorbidity 2;Comorbidity 3+;Sex;Age    Comorbidities HTN, DMII, dysphasia, aphasia, weakness,    Examination-Activity Limitations Locomotion Level;Stairs;Dressing;Bathing;Reach Overhead;Lift;Caring for Others    Examination-Participation Restrictions Driving;Medication Management;Personal Finances;Cleaning;Meal Prep;Yard Work;Community Activity;Shop;Other;Laundry    Stability/Clinical Decision Making Evolving/Moderate complexity    Rehab Potential  Good    PT Frequency 2x / week    PT Duration 12 weeks    PT Treatment/Interventions ADLs/Self Care Home Management;Biofeedback;Canalith Repostioning;Aquatic Therapy;Cryotherapy;Moist Heat;DME Instruction;Gait training;Stair training;Functional mobility training;Therapeutic activities;Therapeutic exercise;Balance training;Neuromuscular re-education;Cognitive remediation;Patient/family education;Orthotic Fit/Training;Manual techniques;Passive range of motion;Energy conservation;Taping;Splinting;Vestibular;Visual/perceptual remediation/compensation;Joint Manipulations    PT Next Visit Plan strength, endurance, balance and gait interventions    PT Home Exercise Plan No changes to HEP this visit    Consulted and Agree with Plan of Care Patient;Family member/caregiver    Family Member Consulted sister             Patient will benefit from skilled therapeutic intervention in order to improve the following deficits and impairments:  Abnormal gait, Decreased activity tolerance, Decreased endurance, Decreased range of motion, Decreased strength, Hypomobility, Improper body mechanics, Decreased balance, Decreased coordination, Decreased mobility, Difficulty walking, Impaired flexibility, Postural dysfunction, Impaired vision/preception, Impaired sensation  Visit Diagnosis: Abnormality of gait and mobility  Difficulty in walking, not elsewhere classified  Muscle weakness (generalized)  Other lack of coordination  Unsteadiness on feet  Other abnormalities of gait and mobility     Problem List 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) 06/02/2021   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 (CVA) 06/02/2021   Vitamin D deficiency 06/02/2021   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

## 2021-07-29 NOTE — Therapy (Signed)
[facility] MAIN [facility] SERVICES [address] [place], [place], [address] Phone: [phone]   Fax:  [phone]  Occupational Therapy Treatment  Patient Details  Name: Laura [name] MRN: [id number] Date of Birth: Jun 02, 1951 No data recorded  Encounter Date: 07/29/2021   OT End of Session - 07/29/21 1715     Visit Number 8    Number of Visits 24    Date for OT Re-Evaluation 10/04/21    OT Start Time 1650    OT Stop Time 1730    OT Time Calculation (min) 40 min    Activity Tolerance Patient tolerated treatment well    Behavior During Therapy [facility] for tasks assessed/performed             Past Medical History:  Diagnosis Date   Acute CVA (cerebrovascular accident) (HCC) 05/24/2021   Left forearm fracture    in high school--was casted   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   Subjective Assessment - 07/29/21 1740     Subjective  Patient is present with her brother-in-law    Patient is accompanied by: Family member    Pertinent History Per chart, Laura [name] is a 70 y.o. female in relatively good health but no medical care for years who was admitted on 05/23/2021 to [facility] with garbled speech, right facial droop, vision changes and right upper committee weakness.  Per reports she started feeling weak on 06/14 but refused to come to the hospital.  MRI brain done revealing acute nonhemorrhagic infarct in left caudate head and anterior lentiform nucleus as well as chronic microvascular ischemia mildly advanced for age.  CTA head/neck was negative for AVM, aneurysm or stenosis.  She was noted to have low vitamin B12 level at 97 and was started on supplementation.  Stroke was felt to be due to small vessel disease and neurology recommended DAPT x3 weeks followed by aspirin alone.  She  continued to be limited by weakness with dysarthria, tachycardia with activity, balance deficits as well as motor planning deficits with ADLs.  CIR was recommended due to functional decline from 05-26-2021 to 06-02-2021.    Currently in Pain? No/denies             OT treatment   Therapeutic ex  Pt. Worked on BUE strengthening, and reciprocal motion using the UBE forward and reverse every 2 minutes in standing for 8 min. with no resistance. Constant monitoring was provided.  Neuromuscular reeducation:  Pt. worked on [facility] skills grasping 1" sticks, and 1/4" collars. Pt. worked on storing the objects in the palm, and translatory skills moving the items from the palm of the hand to the tip of the 2nd digit, and thumb. Pt. worked on removing the pegs using bilateral alternating hand patterns.   Patient is making progress with fine motor coordination skills and and isolated thumb movements during transitory movements.  Patient required cues to change direction on the UB E every 2 minutes.  Patient was able to grasp and manipulate the 1 inch sticks however had difficulty with the transitory movements moving them through her hand out to the tip of her index finger and thumb.  Patient dropped several sticks between her fingers when attempting to place them in the pegboard.  Patient was able to place the  1/4 inch collars on the sticks, however had difficulty removing the collars from the sticks without holding the sticks.  She was able to increase speed with bilateral alternating hand movements when removing the 1 inch sticks.  Patient continues to work on improving upper arm strength and fine motor coordination in preparation for using functional hand movements during ADLs and IADLs.                   OT Education - 07/29/21 1714     Education Details UBE, [facility]    Person(s) Educated Patient;Caregiver(s)    Methods Explanation;Verbal cues;Handout    Comprehension Verbalized  understanding;Need further instruction;Verbal cues required                 OT Long Term Goals - 07/12/21 1106       OT LONG TERM GOAL #1   Title Pt will be independent with home exercise program.    Baseline no current program    Time 12    Period Weeks    Status New    Target Date 10/04/21      OT LONG TERM GOAL #2   Title Pt will improve FOTO score to 73 or above to demonstrate a clinically relevant change to impact greater independence in necessary daily tasks.    Baseline Eval score of 61    Time 12    Period Weeks    Status New    Target Date 10/04/21      OT LONG TERM GOAL #3   Title Pt will complete light meal prep with modified independence.    Baseline requires assist from family    Time 12    Period Weeks    Status New    Target Date 10/04/21      OT LONG TERM GOAL #4   Title Pt will complete laundry tasks with modified independence    Baseline Eval: requires assist from family    Time 12    Period Weeks    Status New    Target Date 10/04/21      OT LONG TERM GOAL #5   Title Pt will complete check writing with modified independence.    Baseline eval:  difficulty    Time 6    Period Weeks    Status New    Target Date 08/23/21      Long Term Additional Goals   Additional Long Term Goals --      OT LONG TERM GOAL #6   Title Pt will demonstrate handwriting with legibility 80% or greater to write demographics and signature  on important paper.    Baseline EvalL difficulty with poor legibility    Time 6    Period Weeks    Status New    Target Date 08/23/21      OT LONG TERM GOAL #7   Title Pt will complete basic self care tasks with modified independence.    Baseline increased time and occasional assist    Time 6    Period Weeks    Status New    Target Date 08/23/21      OT LONG TERM GOAL #8   Title Pt to improve coordination by reduction of time on 9 hole peg test by 10 secs to assist with performing coordination tasks with writing,  buttons, zippers.    Baseline Eval: right 52 secs    Time 12    Period Weeks    Status New  Target Date 10/04/21      OT LONG TERM GOAL  #9   TITLE Pt will improve right grip strength by 10# to open jars, containers with modified independence.    Baseline Eval: right 28#    Time 12    Period Weeks    Status New    Target Date 10/04/21                   Plan - 07/29/21 1716     Clinical Impression Statement Patient is making progress with fine motor coordination skills and and isolated thumb movements during transitory movements.  Patient required cues to change direction on the UB E every 2 minutes.  Patient was able to grasp and manipulate the 1 inch sticks however had difficulty with the transitory movements moving them through her hand out to the tip of her index finger and thumb.  Patient dropped several sticks between her fingers when attempting to place them in the pegboard.  Patient was able to place the 1/4 inch collars on the sticks, however had difficulty removing the collars from the sticks without holding the sticks.  She was able to increase speed with bilateral alternating hand movements when removing the 1 inch sticks.  Patient continues to work on improving upper arm strength and fine motor coordination in preparation for using functional hand movements during ADLs and IADLs.      OT Occupational Profile and History Detailed Assessment- Review of Records and additional review of physical, cognitive, psychosocial history related to current functional performance    Occupational performance deficits (Please refer to evaluation for details): ADL's;IADL's;Leisure    Body Structure / Function / Physical Skills ADL;Dexterity;Flexibility;Strength;Balance;Coordination;FMC;IADL;UE functional use;Mobility    Psychosocial Skills Environmental  Adaptations;Habits;Routines and Behaviors    Rehab Potential Good    Clinical Decision Making Limited treatment options, no task  modification necessary    Comorbidities Affecting Occupational Performance: May have comorbidities impacting occupational performance    Modification or Assistance to Complete Evaluation  No modification of tasks or assist necessary to complete eval    OT Frequency 2x / week    OT Duration 12 weeks    OT Treatment/Interventions Self-care/ADL training;Cryotherapy;Therapeutic exercise;DME and/or AE instruction;Functional Mobility Training;Cognitive remediation/compensation;Balance training;Neuromuscular education;Manual Therapy;Splinting;Visual/perceptual remediation/compensation;Moist Heat;Contrast Bath;Therapeutic activities;Patient/family education    Consulted and Agree with Plan of Care Patient;Family member/caregiver             Patient will benefit from skilled therapeutic intervention in order to improve the following deficits and impairments:   Body Structure / Function / Physical Skills: ADL, Dexterity, Flexibility, Strength, Balance, Coordination, FMC, IADL, UE functional use, Mobility   Psychosocial Skills: Environmental  Adaptations, Habits, Routines and Behaviors   Visit Diagnosis: Muscle weakness (generalized)    Problem List 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) 06/02/2021   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 (CVA) 06/02/2021   Vitamin D deficiency 06/02/2021   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

## 2021-07-29 NOTE — Therapy (Signed)
[place] [facility] MAIN [facility] SERVICES [address] [place], [place], [address] Phone: [phone]   Fax:  [phone]  Speech Language Pathology Treatment  Patient Details  Name: Laura [name] MRN: [id number] Date of Birth: [birth year] Referring Provider (SLP): Mariam Dollar, PA-C   Encounter Date: 07/29/2021   End of Session - 07/29/21 1725     Visit Number 5    Number of Visits 25    Date for SLP Re-Evaluation 10/04/21    Authorization Type MCARE/[employer]    SLP Start Time 1500    SLP Stop Time  1600    SLP Time Calculation (min) 60 min    Activity Tolerance Patient tolerated treatment well             Past Medical History:  Diagnosis Date   Acute CVA (cerebrovascular accident) (HCC) 05/24/2021   Left forearm fracture    in high school--was casted   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   Subjective Assessment - 07/29/21 1720     Subjective Patient gets out homework.    Patient is accompained by: Family member   brother-in-law   Currently in Pain? No/denies                   ADULT SLP TREATMENT - 07/29/21 1720       General Information   Behavior/Cognition Alert;Cooperative    HPI Laura [name] is a 70 year old female with past medical hx noted for DM II, HLD,   admitted at [facility] on 05/23/21 with garbled speech, right facial droop, vision changes and  RUE weakness. MRI brain showed acute infarct in left caudate head and anterior lentiform nucleus, chronic microvascular ischemia. Resulting transcortical motor aphasia, dysarthria and cognitive deficits. Blood sugar was 372 on admission. Admitted to CIR 6/22- 06/02/21.      Treatment Provided   Treatment provided Cognitive-Linquistic      Cognitive-Linquistic Treatment   Treatment focused on Aphasia;Cognition     Skilled Treatment Patient has been using Talk Path Therapy app as well as completing crosswords at home.  Reported difficulty with semantic feature analysis task.  Introduced using word webs to simplify this concept for patient.  Patient was able to generate descriptions of common objects and write at word level with min to mod cues.  Reinforced importance of communicating message versus finding the exact word.  Demonstrated how patient can use word web process to facilitate communication when she is having word finding difficulty.  Patient read simple personal questions and generated words and phrases regarding personal interests and family with min cues.      Assessment / Recommendations / Plan   Plan Continue with current plan of care      Progression Toward Goals   Progression toward goals Progressing toward goals              SLP Education - 07/29/21 1725     Education Details word webs    Person(s) Educated Patient;Caregiver(s)    Methods Explanation    Comprehension Verbalized understanding;Returned demonstration;Need further instruction              SLP Short Term Goals - 07/07/21 1755       SLP SHORT TERM GOAL #1   Title Patient will name average  of 8+ items for simple /concrete categories.    Time 10    Period --   sessions   Status New    Target Date 08/18/21      SLP SHORT TERM GOAL #2   Title Patient will generate fluent, grammatical, and cogent sentence to complete concrete linguistic tasks with 80% accuracy.    Time 10    Period --   sessions   Status New    Target Date 08/18/21      SLP SHORT TERM GOAL #3   Title The patient will decode and read aloud sentences of varying length and complexity with 80% accuracy.    Time 10    Period --   sessions   Status New    Target Date 08/18/21      SLP SHORT TERM GOAL #4   Title Patient will complete standardized assessment of cognitive communication skills.    Time 2    Period Weeks    Status New     Target Date 07/21/21              SLP Long Term Goals - 07/07/21 1806       SLP LONG TERM GOAL #1   Title Patient will use compensations for aphasia and dysarthria in 10 minutes moderately complex conversation to express thoughts and opinions with listener comprehension at least 90%.    Time 12    Period Weeks    Status New    Target Date 10/05/21      SLP LONG TERM GOAL #2   Title Patient will write or verbally sequence steps to complete simple to mod complex personally relevant tasks (ADLs, IADLs).    Time 12    Period Weeks    Status New    Target Date 10/05/21      SLP LONG TERM GOAL #3   Title Pt will read paragraph-level materials relating to personal interests, financial and medical matters demonstrating comprehension of details >90% accuracy.    Time 12    Period Weeks    Status New    Target Date 10/05/21              Plan - 07/29/21 1725     Clinical Impression Statement Mahati Vajda presents with mild anomic aphasia, mild dysarthria, and mild-moderate cognitive deficits s/p CVA. Cognitive testing completed showing deficits in attention, memory. SLP suspects hearing loss and well as possible auditory processing deficits impacting comprehension vs receptive language; patient has been referred for audiologic evaluation by her PCP. I recommend skilled ST to address aphasia, dysarthria, and cognitive-communication deficits in order to improve ability to communicate wants and needs and increase pt independence, as pt goal is to return to living independently.    Speech Therapy Frequency 2x / week    Duration 12 weeks    Treatment/Interventions Language facilitation;Environmental controls;Cueing hierarchy;SLP instruction and feedback;Compensatory techniques;Cognitive reorganization;Functional tasks;Compensatory strategies;Internal/external aids;Multimodal communcation approach;Patient/family education    Potential to Achieve Goals Good    SLP Home Exercise Plan TBD     Consulted and Agree with Plan of Care Patient;Family member/caregiver             Patient will benefit from skilled therapeutic intervention in order to improve the following deficits and impairments:   Aphasia  Cognitive communication deficit  Dysarthria and anarthria    Problem List Patient Active Problem List   Diagnosis Date Noted   Hyperlipidemia associated with type 2 diabetes mellitus (HCC) 06/02/2021   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 [place], [place], [address] Phone: [phone]   Fax:  [phone]   Name: Ghadeer Kastelic MRN: [id number] Date of Birth: 02/08/1951

## 2021-08-02 ENCOUNTER — Ambulatory Visit: Payer: Medicare Other | Admitting: Speech Pathology

## 2021-08-02 ENCOUNTER — Ambulatory Visit: Payer: Medicare Other

## 2021-08-02 ENCOUNTER — Other Ambulatory Visit: Payer: Self-pay

## 2021-08-02 DIAGNOSIS — M6281 Muscle weakness (generalized): Secondary | ICD-10-CM

## 2021-08-02 DIAGNOSIS — R2689 Other abnormalities of gait and mobility: Secondary | ICD-10-CM | POA: Diagnosis not present

## 2021-08-02 DIAGNOSIS — R4701 Aphasia: Secondary | ICD-10-CM

## 2021-08-02 DIAGNOSIS — R278 Other lack of coordination: Secondary | ICD-10-CM

## 2021-08-02 DIAGNOSIS — I6300 Cerebral infarction due to thrombosis of unspecified precerebral artery: Secondary | ICD-10-CM

## 2021-08-02 DIAGNOSIS — R41841 Cognitive communication deficit: Secondary | ICD-10-CM

## 2021-08-02 NOTE — Therapy (Signed)
[place] [facility] MAIN [facility] SERVICES [address] [place], [place], [address] Phone: [phone]   Fax:  [phone]  Occupational Therapy Treatment  Patient Details  Name: Laura [name] MRN: [id number] Date of Birth: 01-29-1951 No data recorded  Encounter Date: 08/02/2021   OT End of Session - 08/02/21 0925     Visit Number 9    Number of Visits 24    Date for OT Re-Evaluation 10/04/21    OT Start Time 0901    OT Stop Time 0945    OT Time Calculation (min) 44 min    Activity Tolerance Patient tolerated treatment well    Behavior During Therapy [facility] for tasks assessed/performed             Past Medical History:  Diagnosis Date   Acute CVA (cerebrovascular accident) (HCC) 05/24/2021   Left forearm fracture    in high school--was casted   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   Subjective Assessment - 08/02/21 0913     Subjective  "Doing well."    Patient is accompanied by: Family member    Pertinent History Per chart, Laura [name] is a 70 y.o. female in relatively good health but no medical care for years who was admitted on 05/23/2021 to [facility] with garbled speech, right facial droop, vision changes and right upper committee weakness.  Per reports she started feeling weak on 06/14 but refused to come to the hospital.  MRI brain done revealing acute nonhemorrhagic infarct in left caudate head and anterior lentiform nucleus as well as chronic microvascular ischemia mildly advanced for age.  CTA head/neck was negative for AVM, aneurysm or stenosis.  She was noted to have low vitamin B12 level at 97 and was started on supplementation.  Stroke was felt to be due to small vessel disease and neurology recommended DAPT x3 weeks followed by aspirin alone.  She continued to be limited by weakness  with dysarthria, tachycardia with activity, balance deficits as well as motor planning deficits with ADLs.  CIR was recommended due to functional decline from 05-26-2021 to 06-02-2021.    Patient Stated Goals Pt would like to go home and be as independent as possible    Currently in Pain? No/denies    Pain Score 0-No pain    Multiple Pain Sites No            Occupational Therapy Treatment: Neuro re-ed: Focus on pinch strength today with manipulation of therapy resistant clothespins on a vertical bar using R hand, and pinching small plastic beads together, then separating.  Self Care: Used small and large nail clippers to clip paper making straight and angled cuts to simulate clipping fingernails.  Required mod vc for hand positioning and prehension patterns.  Handwriting addressed using standard pen, see below.   Response to Treatment: Mild ataxia in RUE when clipping clothespins onto vertical target, but was able to pinch all colors.  Pt had difficulty pinching small beads; goal was to fasten 10 beads, but pt stopped at 4 d/t difficulty.  Nail clippers were also challenging to manipulate.  Pt was able to hold them without dropping, but struggled intermittently to clip through thin paper.  Pt has been practicing handwriting at home on lined paper, and all sentences today were 100% legible  using same large spaced lined paper from her home notebook.  Pt and brother-in-law confirm that pt's handwriting today is comparable to writing prior to CVA.  Pt reports she still needs to improve her strength in order to return home alone.  Pt will continue to benefit from skilled OT to address R hand pinch and grip strength in order to improve manipulation of ADL supplies for self care tasks.      OT Education - 08/02/21 [phone]     Education Details [facility] activities    Person(s) Educated Patient;Caregiver(s)    Methods Explanation;Verbal cues;Handout    Comprehension Verbalized understanding;Need further  instruction;Verbal cues required                 OT Long Term Goals - 07/12/21 1106       OT LONG TERM GOAL #1   Title Pt will be independent with home exercise program.    Baseline no current program    Time 12    Period Weeks    Status New    Target Date 10/04/21      OT LONG TERM GOAL #2   Title Pt will improve FOTO score to 73 or above to demonstrate a clinically relevant change to impact greater independence in necessary daily tasks.    Baseline Eval score of 61    Time 12    Period Weeks    Status New    Target Date 10/04/21      OT LONG TERM GOAL #3   Title Pt will complete light meal prep with modified independence.    Baseline requires assist from family    Time 12    Period Weeks    Status New    Target Date 10/04/21      OT LONG TERM GOAL #4   Title Pt will complete laundry tasks with modified independence    Baseline Eval: requires assist from family    Time 12    Period Weeks    Status New    Target Date 10/04/21      OT LONG TERM GOAL #5   Title Pt will complete check writing with modified independence.    Baseline eval:  difficulty    Time 6    Period Weeks    Status New    Target Date 08/23/21      Long Term Additional Goals   Additional Long Term Goals --      OT LONG TERM GOAL #6   Title Pt will demonstrate handwriting with legibility 80% or greater to write demographics and signature  on important paper.    Baseline EvalL difficulty with poor legibility    Time 6    Period Weeks    Status New    Target Date 08/23/21      OT LONG TERM GOAL #7   Title Pt will complete basic self care tasks with modified independence.    Baseline increased time and occasional assist    Time 6    Period Weeks    Status New    Target Date 08/23/21      OT LONG TERM GOAL #8   Title Pt to improve coordination by reduction of time on 9 hole peg test by 10 secs to assist with performing coordination tasks with writing, buttons, zippers.    Baseline  Eval: right 52 secs    Time 12    Period Weeks    Status New    Target Date 10/04/21  OT LONG TERM GOAL  #9   TITLE Pt will improve right grip strength by 10# to open jars, containers with modified independence.    Baseline Eval: right 28#    Time 12    Period Weeks    Status New    Target Date 10/04/21                   Plan - 08/02/21 1006     Clinical Impression Statement Mild ataxia in RUE when clipping clothespins onto vertical target, but was able to pinch all colors.  Pt had difficulty pinching small beads; goal was to fasten 10 beads, but pt stopped at 4 d/t difficulty.  Nail clippers were also challenging to manipulate.  Pt was able to hold them without dropping, but struggled intermittently to clip through thin paper.  Pt has been practicing handwriting at home on lined paper, and all sentences today were 100% legible using same large spaced lined paper from her home notebook.  Pt and brother-in-law confirm that pt's handwriting today is comparable to writing prior to CVA.  Pt reports she still needs to improve her strength in order to return home alone.  Pt will continue to benefit from skilled OT to address R hand pinch and grip strength in order to improve manipulation of ADL supplies for self care tasks.    OT Occupational Profile and History Detailed Assessment- Review of Records and additional review of physical, cognitive, psychosocial history related to current functional performance    Occupational performance deficits (Please refer to evaluation for details): ADL's;IADL's;Leisure    Body Structure / Function / Physical Skills ADL;Dexterity;Flexibility;Strength;Balance;Coordination;FMC;IADL;UE functional use;Mobility    Psychosocial Skills Environmental  Adaptations;Habits;Routines and Behaviors    Rehab Potential Good    Clinical Decision Making Limited treatment options, no task modification necessary    Comorbidities Affecting Occupational Performance: May  have comorbidities impacting occupational performance    Modification or Assistance to Complete Evaluation  No modification of tasks or assist necessary to complete eval    OT Frequency 2x / week    OT Duration 12 weeks    OT Treatment/Interventions Self-care/ADL training;Cryotherapy;Therapeutic exercise;DME and/or AE instruction;Functional Mobility Training;Cognitive remediation/compensation;Balance training;Neuromuscular education;Manual Therapy;Splinting;Visual/perceptual remediation/compensation;Moist Heat;Contrast Bath;Therapeutic activities;Patient/family education    Consulted and Agree with Plan of Care Patient;Family member/caregiver             Patient will benefit from skilled therapeutic intervention in order to improve the following deficits and impairments:   Body Structure / Function / Physical Skills: ADL, Dexterity, Flexibility, Strength, Balance, Coordination, FMC, IADL, UE functional use, Mobility   Psychosocial Skills: Environmental  Adaptations, Habits, Routines and Behaviors   Visit Diagnosis: Muscle weakness (generalized)  Other lack of coordination  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 artery [facility])    Problem List 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) 06/02/2021   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 (CVA) 06/02/2021   Vitamin D deficiency 06/02/2021   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) 06/02/2021   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

## 2021-08-03 ENCOUNTER — Encounter: Payer: Medicare Other | Admitting: Occupational Therapy

## 2021-08-03 ENCOUNTER — Ambulatory Visit: Payer: Medicare Other

## 2021-08-03 NOTE — Therapy (Signed)
[place] [facility] MAIN [facility] SERVICES [address] [place], [place], [address] Phone: [phone]   Fax:  [phone]  Speech Language Pathology Treatment  Patient Details  Name: Laura [name] MRN: [id number] Date of Birth: 1951/06/10 Referring Provider (SLP): Mariam Dollar, PA-C   Encounter Date: 08/02/2021   End of Session - 08/03/21 0944     Visit Number 6    Number of Visits 25    Date for SLP Re-Evaluation 10/04/21    Authorization Type MCARE/[employer]    SLP Start Time 1000    SLP Stop Time  1100    SLP Time Calculation (min) 60 min    Activity Tolerance Patient tolerated treatment well             Past Medical History:  Diagnosis Date   Acute CVA (cerebrovascular accident) (HCC) 05/24/2021   Left forearm fracture    in high school--was casted   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   Subjective Assessment - 08/03/21 0938     Subjective "It was hard," re: crossword    Currently in Pain? No/denies                   ADULT SLP TREATMENT - 08/03/21 0938       General Information   Behavior/Cognition Alert;Cooperative    HPI Laura [name] is a 70 year old female with past medical hx noted for DM II, HLD,   admitted at [facility] on 05/23/21 with garbled speech, right facial droop, vision changes and  RUE weakness. MRI brain showed acute infarct in left caudate head and anterior lentiform nucleus, chronic microvascular ischemia. Resulting transcortical motor aphasia, dysarthria and cognitive deficits. Blood sugar was 372 on admission. Admitted to CIR 6/22- 06/02/21.      Treatment Provided   Treatment provided Cognitive-Linquistic      Cognitive-Linquistic Treatment   Treatment focused on Aphasia;Cognition    Skilled Treatment Patient reported difficulty with crossword  clues.  Targeted decoding of phrases/sentences.  Used semantic description with word webs to generate related terms. Noted patient difficulty with cognitive flexibility and perseveration; educated on identifying when she is "stuck" and either taking a break, skipping the item, or using a strategy (such as word web). Education/encouragement provided that aphasia affects language, not intelligence. Brother-in-law present today and reports that patient barely talks at home.      Assessment / Recommendations / Plan   Plan Continue with current plan of care      Progression Toward Goals   Progression toward goals Progressing toward goals              SLP Education - 08/03/21 0944     Education Details aphasia affects language, not intelligence    Person(s) Educated Patient;Caregiver(s)    Methods Explanation    Comprehension Verbalized understanding;Need further instruction              SLP Short Term Goals - 07/07/21 1755       SLP SHORT TERM GOAL #1   Title Patient will name average of 8+ items for simple /concrete categories.    Time 10    Period --   sessions   Status New    Target Date 08/18/21      SLP SHORT TERM GOAL #  2   Title Patient will generate fluent, grammatical, and cogent sentence to complete concrete linguistic tasks with 80% accuracy.    Time 10    Period --   sessions   Status New    Target Date 08/18/21      SLP SHORT TERM GOAL #3   Title The patient will decode and read aloud sentences of varying length and complexity with 80% accuracy.    Time 10    Period --   sessions   Status New    Target Date 08/18/21      SLP SHORT TERM GOAL #4   Title Patient will complete standardized assessment of cognitive communication skills.    Time 2    Period Weeks    Status New    Target Date 07/21/21              SLP Long Term Goals - 07/07/21 1806       SLP LONG TERM GOAL #1   Title Patient will use compensations for aphasia and dysarthria in 10  minutes moderately complex conversation to express thoughts and opinions with listener comprehension at least 90%.    Time 12    Period Weeks    Status New    Target Date 10/05/21      SLP LONG TERM GOAL #2   Title Patient will write or verbally sequence steps to complete simple to mod complex personally relevant tasks (ADLs, IADLs).    Time 12    Period Weeks    Status New    Target Date 10/05/21      SLP LONG TERM GOAL #3   Title Pt will read paragraph-level materials relating to personal interests, financial and medical matters demonstrating comprehension of details >90% accuracy.    Time 12    Period Weeks    Status New    Target Date 10/05/21              Plan - 08/03/21 0944     Clinical Impression Statement Prisila Dlouhy presents with mild anomic aphasia, mild dysarthria, and mild-moderate cognitive deficits s/p CVA. Cognitive testing completed showing deficits in attention, memory. SLP suspects hearing loss and well as possible auditory processing deficits impacting comprehension vs receptive language; patient has been referred for audiologic evaluation by her PCP. I recommend skilled ST to address aphasia, dysarthria, and cognitive-communication deficits in order to improve ability to communicate wants and needs and increase pt independence, as pt goal is to return to living independently.    Speech Therapy Frequency 2x / week    Duration 12 weeks    Treatment/Interventions Language facilitation;Environmental controls;Cueing hierarchy;SLP instruction and feedback;Compensatory techniques;Cognitive reorganization;Functional tasks;Compensatory strategies;Internal/external aids;Multimodal communcation approach;Patient/family education    Potential to Achieve Goals Good    SLP Home Exercise Plan TBD    Consulted and Agree with Plan of Care Patient;Family member/caregiver             Patient will benefit from skilled therapeutic intervention in order to improve the  following deficits and impairments:   Aphasia  Cognitive communication deficit    Problem List 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) 06/02/2021   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 (CVA) 06/02/2021   Vitamin D deficiency 06/02/2021   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  Arlana Lindau 08/03/2021, 9:45 AM  [place] [facility] MAIN [facility] SERVICES [address] [place], [place], [address] Phone: [phone]   Fax:  [phone]   Name: Kaija Kovacevic MRN: [id number] Date of Birth: [birth year]

## 2021-08-04 ENCOUNTER — Ambulatory Visit: Payer: Medicare Other | Admitting: Occupational Therapy

## 2021-08-04 ENCOUNTER — Ambulatory Visit: Payer: Medicare Other | Admitting: Speech Pathology

## 2021-08-04 ENCOUNTER — Encounter: Payer: Self-pay | Admitting: Occupational Therapy

## 2021-08-04 ENCOUNTER — Ambulatory Visit: Payer: Medicare Other

## 2021-08-04 ENCOUNTER — Other Ambulatory Visit: Payer: Self-pay

## 2021-08-04 DIAGNOSIS — M6281 Muscle weakness (generalized): Secondary | ICD-10-CM

## 2021-08-04 DIAGNOSIS — R278 Other lack of coordination: Secondary | ICD-10-CM

## 2021-08-04 DIAGNOSIS — R471 Dysarthria and anarthria: Secondary | ICD-10-CM

## 2021-08-04 DIAGNOSIS — R2689 Other abnormalities of gait and mobility: Secondary | ICD-10-CM | POA: Diagnosis not present

## 2021-08-04 DIAGNOSIS — R4701 Aphasia: Secondary | ICD-10-CM

## 2021-08-04 DIAGNOSIS — R41841 Cognitive communication deficit: Secondary | ICD-10-CM

## 2021-08-04 NOTE — Therapy (Signed)
[place] [facility] MAIN [facility] SERVICES [address] [place], [place], [address] Phone: [phone]   Fax:  [phone]  Speech Language Pathology Treatment  Patient Details  Name: Laura [name] MRN: [id number] Date of Birth: [birth year] Referring Provider (SLP): Mariam Dollar, PA-C   Encounter Date: 08/04/2021   End of Session - 08/04/21 1729     Visit Number 7    Number of Visits 25    Date for SLP Re-Evaluation 10/04/21    Authorization Type [employer]    SLP Start Time 1400    SLP Stop Time  1500    SLP Time Calculation (min) 60 min    Activity Tolerance Patient tolerated treatment well             Past Medical History:  Diagnosis Date   Acute CVA (cerebrovascular accident) (HCC) 05/24/2021   Left forearm fracture    in high school--was casted   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   Subjective Assessment - 08/04/21 1726     Subjective Pt requests notebook from sister    Currently in Pain? No/denies                   ADULT SLP TREATMENT - 08/04/21 1727       General Information   Behavior/Cognition Alert;Cooperative    HPI Laura [name] is a 70 year old female with past medical hx noted for DM II, HLD,   admitted at [facility] on 05/23/21 with garbled speech, right facial droop, vision changes and  RUE weakness. MRI brain showed acute infarct in left caudate head and anterior lentiform nucleus, chronic microvascular ischemia. Resulting transcortical motor aphasia, dysarthria and cognitive deficits. Blood sugar was 372 on admission. Admitted to CIR 6/22- 06/02/21.      Treatment Provided   Treatment provided Cognitive-Linquistic      Cognitive-Linquistic Treatment   Treatment focused on Aphasia;Cognition    Skilled Treatment Targeted object naming as well as  sentence generation using VNEST.  Patient able to generate 5 subjects and 5 objects for given verbs, and generated 5 sentences for each verb with occasional moderate cues.  Provided activities for home.      Assessment / Recommendations / Plan   Plan Continue with current plan of care      Progression Toward Goals   Progression toward goals Progressing toward goals              SLP Education - 08/04/21 1728     Education Details When stuck, "zoom out"    Person(s) Educated Patient;Caregiver(s)   sister   Methods Explanation    Comprehension Verbalized understanding              SLP Short Term Goals - 07/07/21 1755       SLP SHORT TERM GOAL #1   Title Patient will name average of 8+ items for simple /concrete categories.    Time 10    Period --   sessions   Status New    Target Date 08/18/21      SLP SHORT TERM GOAL #2   Title Patient will generate fluent, grammatical, and cogent sentence to complete concrete linguistic tasks with 80% accuracy.    Time 10    Period --   sessions  Status New    Target Date 08/18/21      SLP SHORT TERM GOAL #3   Title The patient will decode and read aloud sentences of varying length and complexity with 80% accuracy.    Time 10    Period --   sessions   Status New    Target Date 08/18/21      SLP SHORT TERM GOAL #4   Title Patient will complete standardized assessment of cognitive communication skills.    Time 2    Period Weeks    Status New    Target Date 07/21/21              SLP Long Term Goals - 07/07/21 1806       SLP LONG TERM GOAL #1   Title Patient will use compensations for aphasia and dysarthria in 10 minutes moderately complex conversation to express thoughts and opinions with listener comprehension at least 90%.    Time 12    Period Weeks    Status New    Target Date 10/05/21      SLP LONG TERM GOAL #2   Title Patient will write or verbally sequence steps to complete simple to mod complex personally  relevant tasks (ADLs, IADLs).    Time 12    Period Weeks    Status New    Target Date 10/05/21      SLP LONG TERM GOAL #3   Title Pt will read paragraph-level materials relating to personal interests, financial and medical matters demonstrating comprehension of details >90% accuracy.    Time 12    Period Weeks    Status New    Target Date 10/05/21              Plan - 08/04/21 1729     Clinical Impression Statement Laura [name] presents with mild anomic aphasia, mild dysarthria, and mild-moderate cognitive deficits s/p CVA. Cognitive testing completed showing deficits in attention, memory.  Patient initiated conversation more often in session today.  SLP suspects hearing loss and well as possible auditory processing deficits impacting comprehension vs receptive language; patient has been referred for audiologic evaluation by her PCP. I recommend skilled ST to address aphasia, dysarthria, and cognitive-communication deficits in order to improve ability to communicate wants and needs and increase pt independence, as pt goal is to return to living independently.    Speech Therapy Frequency 2x / week    Duration 12 weeks    Treatment/Interventions Language facilitation;Environmental controls;Cueing hierarchy;SLP instruction and feedback;Compensatory techniques;Cognitive reorganization;Functional tasks;Compensatory strategies;Internal/external aids;Multimodal communcation approach;Patient/family education    Potential to Achieve Goals Good    SLP Home Exercise Plan TBD    Consulted and Agree with Plan of Care Patient;Family member/caregiver             Patient will benefit from skilled therapeutic intervention in order to improve the following deficits and impairments:   Aphasia  Cognitive communication deficit  Dysarthria and anarthria    Problem List Patient Active Problem List   Diagnosis Date Noted   Hyperlipidemia associated with type 2 diabetes mellitus (HCC) 06/02/2021    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) 06/02/2021   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 (CVA) 06/02/2021   Vitamin D deficiency 06/02/2021   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) 06/02/2021   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

## 2021-08-04 NOTE — Therapy (Signed)
[place] MAIN [facility] SERVICES [address] [place], [place], [address] Phone: [phone]   Fax:  [phone]  Occupational Therapy Progress Note  Dates of reporting period 07/05/2021   to   08/04/2021  Patient Details  Name: Laura [name] MRN: [id number] Date of Birth: 06-13-1951 No data recorded  Encounter Date: 08/04/2021   OT End of Session - 08/04/21 1724     Visit Number 10    Number of Visits 24    Date for OT Re-Evaluation 10/04/21    Authorization Type Progress report starting 08/04/2021    OT Start Time 1300    OT Stop Time 1345    OT Time Calculation (min) 45 min    Activity Tolerance Patient tolerated treatment well    Behavior During Therapy [facility] for tasks assessed/performed             Past Medical History:  Diagnosis Date   Acute CVA (cerebrovascular accident) ([place]) 05/24/2021   Left forearm fracture    in high school--was casted   Stroke (cerebrum) ([place]) 05/26/2021   Stroke ([place]) 05/23/2021    Past Surgical History:  Procedure Laterality Date   FOOT SURGERY     TONSILLECTOMY      There were no vitals filed for this visit.   Subjective Assessment - 08/04/21 1723     Subjective  Pt. was present with her sister.    Patient is accompanied by: Family member    Pertinent History Per chart, Kaydee Magel is a 70 y.o. female in relatively good health but no medical care for years who was admitted on 05/23/2021 to  [facility] with garbled speech, right facial droop, vision changes and right upper committee weakness.  Per reports she started feeling weak on 06/14 but refused to come to the hospital.  MRI brain done revealing acute nonhemorrhagic infarct in left caudate head and anterior lentiform nucleus as well as chronic microvascular ischemia mildly advanced for age.  CTA head/neck was negative for AVM, aneurysm or stenosis.  She was noted to have low vitamin B12 level at 97 and was started on supplementation.  Stroke was felt to be  due to small vessel disease and neurology recommended DAPT x3 weeks followed by aspirin alone.  She continued to be limited by weakness with dysarthria, tachycardia with activity, balance deficits as well as motor planning deficits with ADLs.  CIR was recommended due to functional decline from 05-26-2021 to 06-02-2021.    Currently in Pain? No/denies                [facility] OT Assessment - 08/04/21 1305       Coordination   Right 9 Hole Peg Test 46    Left 9 Hole Peg Test 25      Strength   Overall Strength Comments BUE strength: 5/5      Hand Function   Right Hand Grip (lbs) 39#    Right Hand Lateral Pinch 13 lbs    Right Hand 3 Point Pinch 10 lbs    Left Hand Grip (lbs) 60    Left Hand Lateral Pinch 15 lbs    Left 3 point pinch 13 lbs    Comment 2pt pinch R:9  L:14            Measurements were obtained, and goals were reviewed with the pt., and her sister. Pt. Has made excellent progress since the initial evaluation with RUE strength, grip strength, pinch strength, and [place] skills, speed, and  dexterity. Pt. 's FOTO score improved to 65. Pt. Has improved with laundry management tasks, and now only occasionally needs assist with the machine dials. Pt. Is able to prepare sandwiches, and use the microwave for light meal preparation. Pt. has improved with writing skills, and is now able to write with 75-80% legibility. Pt. Is now able to perform check writing skills independently, and has her sister review her work for accuracy. Pt. Continues to work on improving RUE functioning in order to improve engagement in ADL, tasks, and work towards improving and maximizing independence with ADLs, and IADLs.                 OT Education - 08/04/21 1724     Education Details [place] activities    Person(s) Educated Patient;Caregiver(s)    Methods Explanation;Verbal cues;Handout    Comprehension Verbalized understanding;Need further instruction;Verbal cues required                  OT Long Term Goals - 08/04/21 1321       OT LONG TERM GOAL #1   Title Pt will be independent with home exercise program.    Baseline 08/04/2021: Independent with Theraputty HEP.    Time 12    Period Weeks    Status Partially Met    Target Date 10/04/21      OT LONG TERM GOAL #2   Title Pt will improve FOTO score to 73 or above to demonstrate a clinically relevant change to impact greater independence in necessary daily tasks.    Baseline 08/04/2021: 65 Eval score of 61    Time 12    Period Weeks    Status New    Target Date 10/04/21      OT LONG TERM GOAL #3   Title Pt will complete light meal prep with modified independence.    Baseline 08/04/2021: Pt. is now preparing lunch sandwiches  independently. pt. is able to use the microwave. Pt. requires assist from family    Time 12    Period Weeks    Status Partially Met    Target Date 10/04/21      OT LONG TERM GOAL #4   Title Pt will complete laundry tasks with modified independence    Baseline 08/04/2021: Pt. is able to wash, transfer, and fold laundry. Pt. has improved, however requires occ. assist with the laundry controls. Eval: requires assist from family    Time 12    Period Weeks    Status New    Target Date 10/04/21      OT LONG TERM GOAL #5   Title Pt will complete check writing with modified independence.    Baseline 08/04/2021: Pt. was able to independently complete, and has her sister review it. Eval:  difficulty    Time 6    Period Weeks    Status Achieved      OT LONG TERM GOAL #6   Title Pt will demonstrate handwriting with legibility 80% or greater to write demographics and signature  on important paper.    Baseline 08/04/2021: Writing 70-80% legibility. EvaL difficulty with poor legibility    Time 6    Period Weeks    Status On-going    Target Date 08/23/21      OT LONG TERM GOAL #7   Title Pt will complete basic self care tasks with modified independence.    Baseline increased time and  occasional assist    Time 6    Period Weeks  Status Achieved      OT LONG TERM GOAL #8   Title Pt to improve coordination by reduction of time on 9 hole peg test by 10 secs to assist with performing coordination tasks with writing, buttons, zippers.    Baseline 08/04/2021: 46 sec. Eval: right 52 secs    Time 12    Period Weeks    Status On-going    Target Date 10/04/21      OT LONG TERM GOAL  #9   TITLE Pt will improve right grip strength by 10# to open jars, containers with modified independence.    Baseline 08/04/2021: 39# Eval: right 28#    Time 12    Period Weeks    Status On-going    Target Date 10/04/21                   Plan - 08/04/21 1725     Plan Measurements were obtained, and goals were reviewed with the pt., and her sister. Pt. Has made excellent progress since the initial evaluation with RUE strength, grip strength, pinch strength, and FMC skills, speed, and dexterity. Pt. 's FOTO score improved to 65. Pt. Has improved with laundry management tasks, and now only occasionally needs assist with the machine dials. Pt. Is able to prepare sandwiches, and use the microwave for light meal preparation. Pt. has improved with writing skills, and is now able to write with 75-80% legibility. Pt. Is now able to perform check writing skills independently, and has her sister review her work for accuracy. Pt. Continues to work on improving RUE functioning in order to improve engagement in ADL, tasks, and work towards improving and maximizing independence with ADLs, and IADLs.     OT Occupational Profile and History Detailed Assessment- Review of Records and additional review of physical, cognitive, psychosocial history related to current functional performance    Occupational performance deficits (Please refer to evaluation for details): ADL's;IADL's;Leisure    Body Structure / Function / Physical Skills ADL;Dexterity;Flexibility;Strength;Balance;Coordination;FMC;IADL;UE  functional use;Mobility    Psychosocial Skills Environmental  Adaptations;Habits;Routines and Behaviors    Rehab Potential Good    Clinical Decision Making Limited treatment options, no task modification necessary    Comorbidities Affecting Occupational Performance: May have comorbidities impacting occupational performance    Modification or Assistance to Complete Evaluation  No modification of tasks or assist necessary to complete eval    OT Frequency 2x / week    OT Duration 12 weeks    OT Treatment/Interventions Self-care/ADL training;Cryotherapy;Therapeutic exercise;DME and/or AE instruction;Functional Mobility Training;Cognitive remediation/compensation;Balance training;Neuromuscular education;Manual Therapy;Splinting;Visual/perceptual remediation/compensation;Moist Heat;Contrast Bath;Therapeutic activities;Patient/family education    Consulted and Agree with Plan of Care Patient;Family member/caregiver    Family Member Consulted Sister             Patient will benefit from skilled therapeutic intervention in order to improve the following deficits and impairments:   Body Structure / Function / Physical Skills: ADL, Dexterity, Flexibility, Strength, Balance, Coordination, FMC, IADL, UE functional use, Mobility   Psychosocial Skills: Environmental  Adaptations, Habits, Routines and Behaviors   Visit Diagnosis: Muscle weakness (generalized)  Other lack of coordination    Problem List 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 [place], [place], [address] Phone: [phone]   Fax:  [phone]  Name: Laura [name] MRN: [id number] Date of Birth: December 25, 1950

## 2021-08-05 ENCOUNTER — Encounter: Payer: Medicare Other | Admitting: Speech Pathology

## 2021-08-06 ENCOUNTER — Other Ambulatory Visit: Payer: Self-pay

## 2021-08-06 ENCOUNTER — Ambulatory Visit: Payer: Medicare Other | Attending: Physician Assistant

## 2021-08-06 DIAGNOSIS — I6300 Cerebral infarction due to thrombosis of unspecified precerebral artery: Secondary | ICD-10-CM | POA: Insufficient documentation

## 2021-08-06 DIAGNOSIS — R2681 Unsteadiness on feet: Secondary | ICD-10-CM | POA: Insufficient documentation

## 2021-08-06 DIAGNOSIS — R269 Unspecified abnormalities of gait and mobility: Secondary | ICD-10-CM | POA: Diagnosis not present

## 2021-08-06 DIAGNOSIS — R4701 Aphasia: Secondary | ICD-10-CM | POA: Diagnosis present

## 2021-08-06 DIAGNOSIS — R278 Other lack of coordination: Secondary | ICD-10-CM | POA: Insufficient documentation

## 2021-08-06 DIAGNOSIS — R262 Difficulty in walking, not elsewhere classified: Secondary | ICD-10-CM | POA: Diagnosis present

## 2021-08-06 DIAGNOSIS — R41841 Cognitive communication deficit: Secondary | ICD-10-CM | POA: Diagnosis present

## 2021-08-06 DIAGNOSIS — M6281 Muscle weakness (generalized): Secondary | ICD-10-CM | POA: Insufficient documentation

## 2021-08-06 DIAGNOSIS — R2689 Other abnormalities of gait and mobility: Secondary | ICD-10-CM | POA: Insufficient documentation

## 2021-08-06 DIAGNOSIS — R471 Dysarthria and anarthria: Secondary | ICD-10-CM | POA: Diagnosis present

## 2021-08-06 NOTE — Therapy (Signed)
[place] [facility] MAIN [facility] SERVICES [address] [place], [place], [address] Phone: [phone]   Fax:  [phone]  Physical Therapy Treatment  Patient Details  Name: Laura [name] MRN: [id number] Date of Birth: [birth year] Referring Provider (PT): Carlis Abbott Drema Pry, MD   Encounter Date: 08/06/2021   PT End of Session - 08/06/21 0001     Visit Number 8    Number of Visits 25    Date for PT Re-Evaluation 09/27/21    Authorization Type Medicare (traditional)    Authorization Time Period 07/05/21-09/27/21    PT Start Time 0850    PT Stop Time 0929    PT Time Calculation (min) 39 min    Equipment Utilized During Treatment Gait belt    Activity Tolerance Patient tolerated treatment well    Behavior During Therapy [facility] for tasks assessed/performed             Past Medical History:  Diagnosis Date   Acute CVA (cerebrovascular accident) (HCC) 05/24/2021   Left forearm fracture    in high school--was casted   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   Subjective Assessment - 08/06/21 0854     Subjective Patient reports continuing to do well; denies any pain; denies any new falls; reports adherence with HEP and brother-in-law concurs.    Patient is accompained by: Family member   sister   Pertinent History Pt presents to evaluation with her sister as pt has difficulty with speech. Pt reports she has not been using an AD. Pt confirms she was admitted to hospital d/t CVA on 05/23/2021. Pt sister reports they are unsure of the onset of the stroke. Pt's sister reports she checked on her sister after she received a birthday card and noticed her sister's signature looked abnormal. She called the pt and reports her voice was slurred. When she checked on her she said she had R side facial droop,  difficulty with R hand. Pt's sister reports pt initially declined going to the ED but agreed to go the following morning where she was diagnosed with a CVA. Once d/c from the hospital the pt went to rehab for a week. Pt was living alone, independently prior to her stroke, but now lives with her sister and her sister's husband. Pt's sister says her husband walks with pt 15 mins 2x/day. Pt's sister reports after pt has been walking for a while she will start to drag her L leg. She says this has improved with time. Pt now requires some assistance with ADLs, including dressing, making her bed, and self-feeding, showering. Pt's sister reports pt now has vision issues as well as balance problems. She reports pt loses balance with dressing herself. Other PMH includes: HTN, DMII, Vitamin b12 deficiency, HLD, Vitamin D deficiency. Pt also with dysphasia, aphasia, weakness d/t recent stroke.    Limitations Walking;Writing;Standing;Lifting;House hold activities;Reading    How long can you sit comfortably? not affected    How long can you stand comfortably? 15 min    How long can you walk comfortably? 15 minutes    Diagnostic tests per chart MRI brain revealed acute nonhemorrhagic infarct in L caudate had and anterior lentiform nucelus and chronic microvascular   ischemia mildly advanced for age. CTA head neck negative for AVM, aneurysms or  stenosis. 2D echo showing EF 65 to 70% with borderline LVH.    Patient Stated Goals Pt would like to go home again    Currently in Pain? No/denies              INTERVENTIONS:   Neuromuscular re-education:   Tandem gait - Forward/backward on blue airex beam x length of beam x 10 reps each direction. Patient with more difficulty with reto gait- requiring 1 UE. She was able to achieve no UE support walking forward.   Side stepping along balance beam x length of beam and No UE support x 10 each direction- VC to take a wider step as able. She was able to follow cues and  increase step width.    Dynamic marching on blue airex pad (high march to work on SLS) x with some unsteadiness- reaching for bars intermittently- Decreased SLS on left LE noted.   Standing on blue airex pad and dynamic UE reaching (lateral  reaching at varying heights) to incorporate trunk rotation and tracking with eyes to locate cone.   Dynamic reaching for cones (on a 3 tier/shelf cart) from top shelf to 2nd shelf down to bottom shelf while standing initially with feet apart on blue airex pad with no UE support x 5 cones.   Dynamic step tapping (Standing on blue airex pad and instructed to tap onto corresponding cones)   Clinical Impression: Patient was challenged with all balance activities with initial unsteadiness with each activity but did improve with each with cues and practice today. She required CGA to min A for safety.  Patient had a harder time negotiating activities with dual task. She would benefit from additional skilled PT intervention to improve strength, balance and mobility                          PT Education - 08/06/21 0855     Education Details Exercise techniques    Person(s) Educated Patient    Methods Explanation;Demonstration;Tactile cues;Verbal cues    Comprehension Verbalized understanding;Returned demonstration;Verbal cues required;Tactile cues required;Need further instruction              PT Short Term Goals - 07/05/21 4259       PT SHORT TERM GOAL #1   Title Patient will be independent in home exercise program to improve strength/mobility for better functional independence with ADLs.    Baseline 07/05/2021: to be initiated next visit    Time 6    Status New    Target Date 08/16/21               PT Long Term Goals - 07/22/21 1726       PT LONG TERM GOAL #1   Title Patient will increase FOTO score to equal to or greater than 79 to demonstrate statistically significant improvement in mobility and quality of life.     Baseline 8/1: 70    Time 12    Period Weeks    Status On-going    Target Date 09/27/21      PT LONG TERM GOAL #2   Title Patient will increase Berg Balance score by > 6 points to demonstrate decreased fall risk during functional activities.    Baseline 8/1: 46/56    Time 12    Period Weeks    Status On-going    Target Date 09/27/21      PT LONG TERM GOAL #3   Title The pt will  ambulate at least 1700 ft during to demonstrate return toward age norm gait ability.    Baseline 8/1: 1398 ft no AD, CGA    Time 12    Period Weeks    Status On-going    Target Date 09/27/21      PT LONG TERM GOAL #4   Title Pt to demonstrate improvement in Mini-BesTest score to 22/28 or higher to represent improved balance and reduced falls risk.    Baseline On 07/22/21: 15/28    Time 8    Period Weeks    Status New    Target Date 09/16/21                   Plan - 08/06/21 1058     Clinical Impression Statement Patient was challenged with all balance activities with initial unsteadiness with each activity but did improve with each with cues and practice today. She required CGA to min A for safety.  Patient had a harder time negotiating activities with dual task. She would benefit from additional skilled PT intervention to improve strength, balance and mobility    Personal Factors and Comorbidities Comorbidity 1;Comorbidity 2;Comorbidity 3+;Sex;Age    Comorbidities HTN, DMII, dysphasia, aphasia, weakness,    Examination-Activity Limitations Locomotion Level;Stairs;Dressing;Bathing;Reach Overhead;Lift;Caring for Others    Examination-Participation Restrictions Driving;Medication Management;Personal Finances;Cleaning;Meal Prep;Yard Work;Community Activity;Shop;Other;Laundry    Stability/Clinical Decision Making Evolving/Moderate complexity    Rehab Potential Good    PT Frequency 2x / week    PT Duration 12 weeks    PT Treatment/Interventions ADLs/Self Care Home  Management;Biofeedback;Canalith Repostioning;Aquatic Therapy;Cryotherapy;Moist Heat;DME Instruction;Gait training;Stair training;Functional mobility training;Therapeutic activities;Therapeutic exercise;Balance training;Neuromuscular re-education;Cognitive remediation;Patient/family education;Orthotic Fit/Training;Manual techniques;Passive range of motion;Energy conservation;Taping;Splinting;Vestibular;Visual/perceptual remediation/compensation;Joint Manipulations    PT Next Visit Plan strength, endurance, balance and gait interventions    PT Home Exercise Plan No changes to HEP this visit    Consulted and Agree with Plan of Care Patient;Family member/caregiver    Family Member Consulted sister             Patient will benefit from skilled therapeutic intervention in order to improve the following deficits and impairments:  Abnormal gait, Decreased activity tolerance, Decreased endurance, Decreased range of motion, Decreased strength, Hypomobility, Improper body mechanics, Decreased balance, Decreased coordination, Decreased mobility, Difficulty walking, Impaired flexibility, Postural dysfunction, Impaired vision/preception, Impaired sensation  Visit Diagnosis: Abnormality of gait and mobility  Difficulty in walking, not elsewhere classified  Muscle weakness (generalized)  Unsteadiness on feet     Problem List 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 [place], [place], [address] Phone: [phone]   Fax:  [phone]  Name: Laura [name] MRN: [id number] Date of Birth: [birth year]

## 2021-08-09 ENCOUNTER — Encounter: Payer: Self-pay | Admitting: Internal Medicine

## 2021-08-10 ENCOUNTER — Ambulatory Visit: Payer: Medicare Other | Admitting: Occupational Therapy

## 2021-08-10 ENCOUNTER — Ambulatory Visit: Payer: Medicare Other

## 2021-08-10 ENCOUNTER — Encounter: Payer: Medicare Other | Admitting: Speech Pathology

## 2021-08-10 ENCOUNTER — Encounter: Payer: Self-pay | Admitting: Occupational Therapy

## 2021-08-10 ENCOUNTER — Other Ambulatory Visit: Payer: Self-pay | Admitting: Internal Medicine

## 2021-08-10 ENCOUNTER — Other Ambulatory Visit: Payer: Self-pay

## 2021-08-10 DIAGNOSIS — R269 Unspecified abnormalities of gait and mobility: Secondary | ICD-10-CM

## 2021-08-10 DIAGNOSIS — R278 Other lack of coordination: Secondary | ICD-10-CM

## 2021-08-10 DIAGNOSIS — R2681 Unsteadiness on feet: Secondary | ICD-10-CM

## 2021-08-10 DIAGNOSIS — M6281 Muscle weakness (generalized): Secondary | ICD-10-CM

## 2021-08-10 DIAGNOSIS — R262 Difficulty in walking, not elsewhere classified: Secondary | ICD-10-CM

## 2021-08-10 DIAGNOSIS — E118 Type 2 diabetes mellitus with unspecified complications: Secondary | ICD-10-CM

## 2021-08-10 NOTE — Therapy (Signed)
[place] MAIN [facility] SERVICES [address] [place], [place], [address] Phone: [phone]   Fax:  [phone]  Occupational Therapy Treatment  Patient Details  Name: Laura [name] MRN: [id number] Date of Birth: 11/28/1951 No data recorded  Encounter Date: 08/10/2021   OT End of Session - 08/12/21 0602     Visit Number 11    Number of Visits 24    Date for OT Re-Evaluation 10/04/21    Authorization Type Progress report starting 08/04/2021    OT Start Time 1100    OT Stop Time 1146    OT Time Calculation (min) 46 min    Activity Tolerance Patient tolerated treatment well    Behavior During Therapy [facility] for tasks assessed/performed             Past Medical History:  Diagnosis Date   Acute CVA (cerebrovascular accident) ([place]) 05/24/2021   Diabetes mellitus without complication ([place]) 96/22/2979   Hyperlipidemia 05/23/2021   Hypertension 05/23/2021   Left forearm fracture    in high school--was casted   Stroke (cerebrum) ([place]) 05/26/2021   Stroke ([place]) 05/23/2021    Past Surgical History:  Procedure Laterality Date   FOOT SURGERY     FRACTURE SURGERY  ?   Foot fracture, no surgery   TONSILLECTOMY      There were no vitals filed for this visit.   Subjective Assessment - 08/13/21 0600     Subjective  Pt reports she has been doing better, saw improvements with her measurements last time.  Using hand more at home for tasks.    Patient is accompanied by: Family member    Pertinent History Per chart, Laura [name] is a 70 y.o. female in relatively good health but no medical care for years who was admitted on 05/23/2021 to [facility] with garbled speech, right facial droop, vision changes and right upper committee weakness.  Per reports she started feeling weak on 06/14 but refused to come to the hospital.  MRI brain done revealing acute nonhemorrhagic infarct in left caudate head and anterior lentiform nucleus as well as chronic microvascular  ischemia mildly advanced for age.  CTA head/neck was negative for AVM, aneurysm or stenosis.  She was noted to have low vitamin B12 level at 97 and was started on supplementation.  Stroke was felt to be due to small vessel disease and neurology recommended DAPT x3 weeks followed by aspirin alone.  She continued to be limited by weakness with dysarthria, tachycardia with activity, balance deficits as well as motor planning deficits with ADLs.  CIR was recommended due to functional decline from 05-26-2021 to 06-02-2021.    Patient Stated Goals Pt would like to go home and be as independent as possible    Currently in Pain? No/denies    Pain Score 0-No pain            Therapeutic Exercises: Pt seen for strengthening with focus on Grip strength with 3rd setting for one set, second set for 10 reps then 2nd setting for 15 reps.   Cues for proper grip on hand gripper and for sustained gripping patterns.   Neuro: Pt seen for manipulation of [place] discs with right hand unilateral and then working bilateral followed by Stacking up to 5 at a time.  Cues for isolated finger movements.    Unknotting exercises with bilateral hand use with medium nylon rope.   Manipulation of Small letter cubes about 1/4 inch in size to pick up from flat  surface and place into small container.  Cues for prehension patterns.   Manipulation of small Marbles to pick up, use translatory skills of the hand and move to palm, using hand for storage and then to fingertips to place into container  Discussed some options for home program with small objects to pick up and move to palm and back to fingertips, using hand for storage.   Response to tx: Pt continues to make excellent progress with hand skills with strengthening and fine motor coordination skills. Cues for prehension patterns to pick up and manipulate objects.  Performs well with picking up small objects but has more difficulty with translatory skills of the hand and  using the hand for storage.  Instructed on ideas for home program to continue to work on these skills.  Continue towards goals in plan of care to maximize safety and independence in necessary daily tasks.                    OT Education - 08/12/21 0602     Education Details [place] activities, strengthening    Person(s) Educated Patient;Caregiver(s)    Methods Explanation;Verbal cues;Handout    Comprehension Verbalized understanding;Need further instruction;Verbal cues required                 OT Long Term Goals - 08/04/21 1321       OT LONG TERM GOAL #1   Title Pt will be independent with home exercise program.    Baseline 08/04/2021: Independent with Theraputty HEP.    Time 12    Period Weeks    Status Partially Met    Target Date 10/04/21      OT LONG TERM GOAL #2   Title Pt will improve FOTO score to 73 or above to demonstrate a clinically relevant change to impact greater independence in necessary daily tasks.    Baseline 08/04/2021: 65 Eval score of 61    Time 12    Period Weeks    Status New    Target Date 10/04/21      OT LONG TERM GOAL #3   Title Pt will complete light meal prep with modified independence.    Baseline 08/04/2021: Pt. is now preparing lunch sandwiches  independently. pt. is able to use the microwave. Pt. requires assist from family    Time 12    Period Weeks    Status Partially Met    Target Date 10/04/21      OT LONG TERM GOAL #4   Title Pt will complete laundry tasks with modified independence    Baseline 08/04/2021: Pt. is able to wash, transfer, and fold laundry. Pt. has improved, however requires occ. assist with the laundry controls. Eval: requires assist from family    Time 12    Period Weeks    Status New    Target Date 10/04/21      OT LONG TERM GOAL #5   Title Pt will complete check writing with modified independence.    Baseline 08/04/2021: Pt. was able to independently complete, and has her sister review it. Eval:   difficulty    Time 6    Period Weeks    Status Achieved      OT LONG TERM GOAL #6   Title Pt will demonstrate handwriting with legibility 80% or greater to write demographics and signature  on important paper.    Baseline 08/04/2021: Writing 70-80% legibility. EvaL difficulty with poor legibility    Time 6  Period Weeks    Status On-going    Target Date 08/23/21      OT LONG TERM GOAL #7   Title Pt will complete basic self care tasks with modified independence.    Baseline increased time and occasional assist    Time 6    Period Weeks    Status Achieved      OT LONG TERM GOAL #8   Title Pt to improve coordination by reduction of time on 9 hole peg test by 10 secs to assist with performing coordination tasks with writing, buttons, zippers.    Baseline 08/04/2021: 46 sec. Eval: right 52 secs    Time 12    Period Weeks    Status On-going    Target Date 10/04/21      OT LONG TERM GOAL  #9   TITLE Pt will improve right grip strength by 10# to open jars, containers with modified independence.    Baseline 08/04/2021: 39# Eval: right 28#    Time 12    Period Weeks    Status On-going    Target Date 10/04/21                   Plan - 08/12/21 0603     OT Occupational Profile and History Detailed Assessment- Review of Records and additional review of physical, cognitive, psychosocial history related to current functional performance    Occupational performance deficits (Please refer to evaluation for details): ADL's;IADL's;Leisure    Body Structure / Function / Physical Skills ADL;Dexterity;Flexibility;Strength;Balance;Coordination;FMC;IADL;UE functional use;Mobility    Psychosocial Skills Environmental  Adaptations;Habits;Routines and Behaviors    Rehab Potential Good    Clinical Decision Making Limited treatment options, no task modification necessary    Comorbidities Affecting Occupational Performance: May have comorbidities impacting occupational performance     Modification or Assistance to Complete Evaluation  No modification of tasks or assist necessary to complete eval    OT Frequency 2x / week    OT Duration 12 weeks    OT Treatment/Interventions Self-care/ADL training;Cryotherapy;Therapeutic exercise;DME and/or AE instruction;Functional Mobility Training;Cognitive remediation/compensation;Balance training;Neuromuscular education;Manual Therapy;Splinting;Visual/perceptual remediation/compensation;Moist Heat;Contrast Bath;Therapeutic activities;Patient/family education    Consulted and Agree with Plan of Care Patient;Family member/caregiver    Family Member Consulted Sister             Patient will benefit from skilled therapeutic intervention in order to improve the following deficits and impairments:   Body Structure / Function / Physical Skills: ADL, Dexterity, Flexibility, Strength, Balance, Coordination, FMC, IADL, UE functional use, Mobility   Psychosocial Skills: Environmental  Adaptations, Habits, Routines and Behaviors   Visit Diagnosis: Muscle weakness (generalized)  Other lack of coordination  Unsteadiness on feet    Problem List Patient Active Problem List   Diagnosis Date Noted   Hyperlipidemia associated with type 2 diabetes mellitus ([place]) 06/02/2021   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) 06/02/2021   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 (CVA) 06/02/2021   Vitamin D deficiency 06/02/2021   Aphasia due to acute stroke ([place]) 06/02/2021   Stroke (cerebrum) ([place]) 05/26/2021   Type II diabetes mellitus with complication ([place])    Vitamin B12 deficiency    Essential hypertension 05/23/2021   Eyesight diminished 12/05/2020   Diminished hearing 12/05/2020   Laura [name] T Laura [name], OTR/L, CLT  Laura [name] 08/13/2021, 6:15 AM  [place], [place], [address] Phone: [phone]   Fax:  [phone]  Name: Laura [name]  MRN: [id number] Date of Birth: Oct 10, 1951

## 2021-08-10 NOTE — Therapy (Signed)
[facility] MAIN [facility] [address] [place], [place], [address] Phone: [phone]   Fax:  [phone]  Physical Therapy Treatment  Patient Details  Name: Laura GurneyWanda [name] MRN: [id number] Date of Birth: 20-Nov-1951 Referring Provider (PT): Carlis Abbottaulkar, Drema PryKrutika P, MD   Encounter Date: 08/10/2021   PT End of Session - 08/10/21 1019     Visit Number 9    Number of Visits 25    Date for PT Re-Evaluation 09/27/21    Authorization Type Medicare (traditional)    Authorization Time Period 07/05/21-09/27/21    PT Start Time 1015    PT Stop Time 1059    PT Time Calculation (min) 44 min    Equipment Utilized During Treatment Gait belt    Activity Tolerance Patient tolerated treatment well    Behavior [facility] Therapy [facility] for tasks assessed/performed             Past Medical History:  Diagnosis Date   Acute CVA (cerebrovascular accident) (HCC) 05/24/2021   Left forearm fracture    in high school--was casted   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   Subjective Assessment - 08/10/21 1018     Subjective Patient reports doing well this week and denies any falls and no pain.    Patient is accompained by: Family member   sister   Pertinent History Pt presents to evaluation with her sister as pt has difficulty with speech. Pt reports she has not been using an AD. Pt confirms she was admitted to hospital d/t CVA on 05/23/2021. Pt sister reports they are unsure of the onset of the stroke. Pt's sister reports she checked on her sister after she received a birthday card and noticed her sister's signature looked abnormal. She called the pt and reports her voice was slurred. When she checked on her she said she had R side facial droop, difficulty with R hand. Pt's sister reports pt initially declined  going to the ED but agreed to go the following morning where she was diagnosed with a CVA. Once d/c from the hospital the pt went to rehab for a week. Pt was living alone, independently prior to her stroke, but now lives with her sister and her sister's husband. Pt's sister says her husband walks with pt 15 mins 2x/day. Pt's sister reports after pt has been walking for a while she will start to drag her L leg. She says this has improved with time. Pt now requires some assistance with ADLs, including dressing, making her bed, and self-feeding, showering. Pt's sister reports pt now has vision issues as well as balance problems. She reports pt loses balance with dressing herself. Other PMH includes: HTN, DMII, Vitamin b12 deficiency, HLD, Vitamin D deficiency. Pt also with dysphasia, aphasia, weakness d/t recent stroke.    Limitations Walking;Writing;Standing;Lifting;House hold activities;Reading    How long can you sit comfortably? not affected    How long can you stand comfortably? 15 min    How long can you walk comfortably? 15 minutes    Diagnostic tests per chart MRI brain revealed acute nonhemorrhagic infarct in L caudate had and anterior lentiform nucelus and chronic microvascular   ischemia mildly advanced for age. CTA head neck negative for AVM, aneurysms or stenosis. 2D echo showing EF 65 to  70% with borderline LVH.    Patient Stated Goals Pt would like to go home again    Currently in Pain? No/denies             INTERVENTIONS:      Neuromuscular re-education:   Standing on blue airex pad and dynamic squat to pick up cone(s) placed laterally and lateral trunk rotation to place cone on opp. Side. X 8 reps each side.   Standing on blue airex with lateral step tapping to cones placed on left and right side x 12 reps each side. Intermittent use of 1 UE support secondary to unsteadiness  Tandem standing - hold as long as possibe- 4-5 sec on left LE and up to 20 sec on Right (as front  leg)Patient with more difficulty with reto gait- requiring 1 UE. She was able to achieve no UE support walking forward.   Side stepping along balance beam x length of beam and No UE support x 10 each direction.   Dynamic marching on blue airex pad (high march to work on SLS) x 12 reps each LE with some unsteadiness- reaching for bars intermittently- Decreased SLS on left LE noted.    Dynamic Golfers lean to pick up cone on floor x 8 reps each LE- Patient with increased difficulty and required 1UE support.   Dynamic reaching forward toward wall - with golfers lean  (opp UE/LE) x 12 reps each.   Therapeutic Exercises:   Lunges- standing on blue airex pad - lunge forward to another positioned blue airex pad and 1UE support on bar x12 reps each LE  Standing calf raises on 1/2 foam roll x 12 reps Standing toe raises on 1/2 foam roll x 12 reps  Clinical Impression: Patient continued to be challenged with higher level dynamic balance activities requiring more UE support than in past sessions.  She required CGA to min A for safety.  She remains well motivated and receptive to [facility] and instruction in exercise technique. She exhibited most difficulty with dynamic forward reaching- difficulty with coordinating UE/LE and maintaining balance. She would benefit from additional skilled PT intervention to improve strength, balance and mobility                       PT Education - 08/10/21 1018     Education Details Exercise technique    Person(s) Educated Patient    Methods Explanation;Demonstration;Tactile cues;Verbal cues    Comprehension Returned demonstration;Verbal cues required;Verbalized understanding;Tactile cues required;Need further instruction              PT Short Term Goals - 07/05/21 1740       PT SHORT TERM GOAL #1   Title Patient will be independent in home exercise program to improve strength/mobility for better functional independence with ADLs.    Baseline  07/05/2021: to be initiated next visit    Time 6    Status New    Target Date 08/16/21               PT Long Term Goals - 07/22/21 1726       PT LONG TERM GOAL #1   Title Patient will increase FOTO score to equal to or greater than 79 to demonstrate statistically significant improvement in mobility and quality of life.    Baseline 8/1: 70    Time 12    Period Weeks    Status On-going    Target Date 09/27/21      PT  LONG TERM GOAL #2   Title Patient will increase Berg Balance score by > 6 points to demonstrate decreased fall risk during functional activities.    Baseline 8/1: 46/56    Time 12    Period Weeks    Status On-going    Target Date 09/27/21      PT LONG TERM GOAL #3   Title The pt will ambulate at least 1700 ft during to demonstrate return toward age norm gait ability.    Baseline 8/1: 1398 ft no AD, CGA    Time 12    Period Weeks    Status On-going    Target Date 09/27/21      PT LONG TERM GOAL #4   Title Pt to demonstrate improvement in Mini-BesTest score to 22/28 or higher to represent improved balance and reduced falls risk.    Baseline On 07/22/21: 15/28    Time 8    Period Weeks    Status New    Target Date 09/16/21                   Plan - 08/10/21 1019     Clinical Impression Statement Patient continued to be challenged with higher level dynamic balance activities requiring more UE support than in past sessions.  She required CGA to min A for safety.  She remains well motivated and receptive to [facility] and instruction in exercise technique. She exhibited most difficulty with dynamic forward reaching- difficulty with coordinating UE/LE and maintaining balance. She would benefit from additional skilled PT intervention to improve strength, balance and mobility    Personal Factors and Comorbidities Comorbidity 1;Comorbidity 2;Comorbidity 3+;Sex;Age    Comorbidities HTN, DMII, dysphasia, aphasia, weakness,    Examination-Activity Limitations  Locomotion Level;Stairs;Dressing;Bathing;Reach Overhead;Lift;Caring for Others    Examination-Participation Restrictions Driving;Medication Management;Personal Finances;Cleaning;Meal Prep;Yard Work;Community Activity;Shop;Other;Laundry    Stability/Clinical Decision Making Evolving/Moderate complexity    Rehab Potential Good    PT Frequency 2x / week    PT Duration 12 weeks    PT Treatment/Interventions ADLs/Self Care Home Management;Biofeedback;Canalith Repostioning;Aquatic Therapy;Cryotherapy;Moist Heat;DME Instruction;Gait training;Stair training;Functional mobility training;Therapeutic activities;Therapeutic exercise;Balance training;Neuromuscular re-education;Cognitive remediation;Patient/family education;Orthotic Fit/Training;Manual techniques;Passive range of motion;Energy conservation;Taping;Splinting;Vestibular;Visual/perceptual remediation/compensation;Joint Manipulations    PT Next Visit Plan strength, endurance, balance and gait interventions    PT Home Exercise Plan No changes to HEP this visit    Consulted and Agree with Plan of Care Patient;Family member/caregiver    Family Member Consulted sister             Patient will benefit from skilled therapeutic intervention in order to improve the following deficits and impairments:  Abnormal gait, Decreased activity tolerance, Decreased endurance, Decreased range of motion, Decreased strength, Hypomobility, Improper body mechanics, Decreased balance, Decreased coordination, Decreased mobility, Difficulty walking, Impaired flexibility, Postural dysfunction, Impaired vision/preception, Impaired sensation  Visit Diagnosis: Abnormality of gait and mobility  Difficulty in walking, not elsewhere classified  Muscle weakness (generalized)  Other lack of coordination     Problem List Patient Active Problem List   Diagnosis Date Noted   Hyperlipidemia associated with type 2 diabetes mellitus (HCC) 06/02/2021   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 [place], [place], [address] Phone: [phone]   Fax:  [phone]  Name: Keni Wafer MRN: [id number] Date of Birth: 07-May-1951

## 2021-08-10 NOTE — Telephone Encounter (Signed)
Please review.  KP

## 2021-08-11 ENCOUNTER — Ambulatory Visit (INDEPENDENT_AMBULATORY_CARE_PROVIDER_SITE_OTHER): Payer: Medicare Other

## 2021-08-11 VITALS — BP 112/62 | HR 88 | Temp 98.2°F | Resp 16 | Ht 68.0 in | Wt 151.0 lb

## 2021-08-11 DIAGNOSIS — Z0000 Encounter for general adult medical examination without abnormal findings: Secondary | ICD-10-CM

## 2021-08-11 DIAGNOSIS — Z780 Asymptomatic menopausal state: Secondary | ICD-10-CM

## 2021-08-11 NOTE — Patient Instructions (Signed)
[name] , Thank you for taking time to come for your Medicare Wellness Visit. I appreciate your ongoing commitment to your health goals. Please review the following plan we discussed and let me know if I can assist you in the future.   Screening recommendations/referrals: Colonoscopy: declined Mammogram: declined Bone Density: Ordered today. Please call [phone] to schedule your bone density screening.  Recommended yearly ophthalmology/optometry visit for glaucoma screening and checkup Recommended yearly dental visit for hygiene and checkup  Vaccinations: Influenza vaccine: declined Pneumococcal vaccine: done 07/26/21 Tdap vaccine: due Shingles vaccine: Shingrix discussed. Please contact your pharmacy for coverage information.  Covid-19:due  Advanced directives: Please bring a copy of your health care power of attorney and living will to the office at your convenience.   Conditions/risks identified: Keep up the great work!  Next appointment: Follow up in one year for your annual wellness visit    Preventive Care 65 Years and Older, Female Preventive care refers to lifestyle choices and visits with your health care provider that can promote health and wellness. What does preventive care include? A yearly physical exam. This is also called an annual well check. Dental exams once or twice a year. Routine eye exams. Ask your health care provider how often you should have your eyes checked. Personal lifestyle choices, including: Daily care of your teeth and gums. Regular physical activity. Eating a healthy diet. Avoiding tobacco and drug use. Limiting alcohol use. Practicing safe sex. Taking low-dose aspirin every day. Taking vitamin and mineral supplements as recommended by your health care provider. What happens during an annual well check? The services and screenings done by your health care provider during your annual well check will depend on your age, overall health,  lifestyle risk factors, and family history of disease. Counseling  Your health care provider may ask you questions about your: Alcohol use. Tobacco use. Drug use. Emotional well-being. Home and relationship well-being. Sexual activity. Eating habits. History of falls. Memory and ability to understand (cognition). Work and work Astronomer. Reproductive health. Screening  You may have the following tests or measurements: Height, weight, and BMI. Blood pressure. Lipid and cholesterol levels. These may be checked every 5 years, or more frequently if you are over 78 years old. Skin check. Lung cancer screening. You may have this screening every year starting at age 13 if you have a 30-pack-year history of smoking and currently smoke or have quit within the past 15 years. Fecal occult blood test (FOBT) of the stool. You may have this test every year starting at age 49. Flexible sigmoidoscopy or colonoscopy. You may have a sigmoidoscopy every 5 years or a colonoscopy every 10 years starting at age 86. Hepatitis C blood test. Hepatitis B blood test. Sexually transmitted disease (STD) testing. Diabetes screening. This is done by checking your blood sugar (glucose) after you have not eaten for a while (fasting). You may have this done every 1-3 years. Bone density scan. This is done to screen for osteoporosis. You may have this done starting at age 20. Mammogram. This may be done every 1-2 years. Talk to your health care provider about how often you should have regular mammograms. Talk with your health care provider about your test results, treatment options, and if necessary, the need for more tests. Vaccines  Your health care provider may recommend certain vaccines, such as: Influenza vaccine. This is recommended every year. Tetanus, diphtheria, and acellular pertussis (Tdap, Td) vaccine. You may need a Td booster every 10 years. Zoster  vaccine. You may need this after age  50. Pneumococcal 13-valent conjugate (PCV13) vaccine. One dose is recommended after age 83. Pneumococcal polysaccharide (PPSV23) vaccine. One dose is recommended after age 54. Talk to your health care provider about which screenings and vaccines you need and how often you need them. This information is not intended to replace advice given to you by your health care provider. Make sure you discuss any questions you have with your health care provider. Document Released: 12/18/2015 Document Revised: 08/10/2016 Document Reviewed: 09/22/2015 Elsevier Interactive Patient Education  2017 [place] Prevention in the Home Falls can cause injuries. They can happen to people of all ages. There are many things you can do to make your home safe and to help prevent falls. What can I do on the outside of my home? Regularly fix the edges of walkways and driveways and fix any cracks. Remove anything that might make you trip as you walk through a door, such as a raised step or threshold. Trim any bushes or trees on the path to your home. Use bright outdoor lighting. Clear any walking paths of anything that might make someone trip, such as rocks or tools. Regularly check to see if handrails are loose or broken. Make sure that both sides of any steps have handrails. Any raised decks and porches should have guardrails on the edges. Have any leaves, snow, or ice cleared regularly. Use sand or salt on walking paths during winter. Clean up any spills in your garage right away. This includes oil or grease spills. What can I do in the bathroom? Use night lights. Install grab bars by the toilet and in the tub and shower. Do not use towel bars as grab bars. Use non-skid mats or decals in the tub or shower. If you need to sit down in the shower, use a plastic, non-slip stool. Keep the floor dry. Clean up any water that spills on the floor as soon as it happens. Remove soap buildup in the tub or shower  regularly. Attach bath mats securely with double-sided non-slip rug tape. Do not have throw rugs and other things on the floor that can make you trip. What can I do in the bedroom? Use night lights. Make sure that you have a light by your bed that is easy to reach. Do not use any sheets or blankets that are too big for your bed. They should not hang down onto the floor. Have a firm chair that has side arms. You can use this for support while you get dressed. Do not have throw rugs and other things on the floor that can make you trip. What can I do in the kitchen? Clean up any spills right away. Avoid walking on wet floors. Keep items that you use a lot in easy-to-reach places. If you need to reach something above you, use a strong step stool that has a grab bar. Keep electrical cords out of the way. Do not use floor polish or wax that makes floors slippery. If you must use wax, use non-skid floor wax. Do not have throw rugs and other things on the floor that can make you trip. What can I do with my stairs? Do not leave any items on the stairs. Make sure that there are handrails on both sides of the stairs and use them. Fix handrails that are broken or loose. Make sure that handrails are as long as the stairways. Check any carpeting to make sure that it  is firmly attached to the stairs. Fix any carpet that is loose or worn. Avoid having throw rugs at the top or bottom of the stairs. If you do have throw rugs, attach them to the floor with carpet tape. Make sure that you have a light switch at the top of the stairs and the bottom of the stairs. If you do not have them, ask someone to add them for you. What else can I do to help prevent falls? Wear shoes that: Do not have high heels. Have rubber bottoms. Are comfortable and fit you well. Are closed at the toe. Do not wear sandals. If you use a stepladder: Make sure that it is fully opened. Do not climb a closed stepladder. Make sure that  both sides of the stepladder are locked into place. Ask someone to hold it for you, if possible. Clearly mark and make sure that you can see: Any grab bars or handrails. First and last steps. Where the edge of each step is. Use tools that help you move around (mobility aids) if they are needed. These include: Canes. Walkers. Scooters. Crutches. Turn on the lights when you go into a dark area. Replace any light bulbs as soon as they burn out. Set up your furniture so you have a clear path. Avoid moving your furniture around. If any of your floors are uneven, fix them. If there are any pets around you, be aware of where they are. Review your medicines with your doctor. Some medicines can make you feel dizzy. This can increase your chance of falling. Ask your doctor what other things that you can do to help prevent falls. This information is not intended to replace advice given to you by your health care provider. Make sure you discuss any questions you have with your health care provider. Document Released: 09/17/2009 Document Revised: 04/28/2016 Document Reviewed: 12/26/2014 Elsevier Interactive Patient Education  2017 [employer].

## 2021-08-11 NOTE — Progress Notes (Signed)
Subjective:   Laura [name] is a 70 y.o. female who presents for an Initial Medicare Annual Wellness Visit.  Review of Systems     Cardiac Risk Factors include: advanced age (>12men, >67 women);diabetes mellitus;dyslipidemia;hypertension     Objective:    Today's Vitals   08/11/21 1525  BP: 112/62  Pulse: 88  Resp: 16  Temp: 98.2 F (36.8 C)  TempSrc: Oral  SpO2: (!) 15%  Weight: 151 lb (68.5 kg)  Height:  (1.727 m)   Body mass index is 22.96 kg/m.  Advanced Directives 08/11/2021 07/06/2021 07/05/2021 05/26/2021 05/23/2021  Does Patient Have a Medical Advance Directive? Yes No No No No  Type of Advance Directive Healthcare Power of Attorney - - - -  Copy of Healthcare Power of Attorney in Chart? No - copy requested - - - -  Would patient like information on creating a medical advance directive? - - Yes (Inpatient - patient requests chaplain consult to create a medical advance directive);No - Patient declined Yes (Inpatient - patient requests chaplain consult to create a medical advance directive) -    Current Medications (verified) Outpatient Encounter Medications as of 08/11/2021  Medication Sig   Accu-Chek Softclix Lancets lancets Use twice a day   amLODipine (NORVASC) 5 MG tablet Take 1 tablet (5 mg total) by mouth daily.   aspirin 81 MG EC tablet Take 1 tablet (81 mg total) by mouth daily. Swallow whole.   atorvastatin (LIPITOR) 80 MG tablet Take 1 tablet (80 mg total) by mouth every evening.   cyanocobalamin 1000 MCG tablet Take 1 tablet (1,000 mcg total) by mouth daily.   glucose blood (ACCU-CHEK GUIDE) test strip Test blood sugar twice a day   Insulin Glargine (BASAGLAR KWIKPEN) 100 UNIT/ML Inject 24 Units into the skin daily.   Insulin Pen Needle (COMFORT TOUCH INSULIN PEN NEED) 32G X 6 MM MISC 1 application by Does not apply route daily.   metFORMIN (GLUCOPHAGE-XR) 500 MG 24 hr tablet Take 1 tablet (500 mg total) by mouth in the morning and at bedtime.    polyethylene glycol (MIRALAX / GLYCOLAX) 17 g packet Take 17 g by mouth daily as needed for moderate constipation.   Vitamin D, Ergocalciferol, (DRISDOL) 1.25 MG (50000 UNIT) CAPS capsule Take 1 capsule (50,000 Units total) by mouth every 7 (seven) days.   No facility-administered encounter medications on file as of 08/11/2021.    Allergies (verified) Patient has no known allergies.   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   Foot fracture, no surgery   TONSILLECTOMY     Family History  Problem Relation Age of Onset   Hypertension Mother    Diabetes Mother    Arthritis Mother    Heart disease Mother    Heart failure Father    Stroke Father    COPD Father    Hypertension Sister    Arthritis Sister    Heart disease Maternal Grandfather    Heart disease Maternal Grandmother    Stroke Paternal Grandfather    Stroke Paternal Grandmother    Arthritis Sister    Hypertension  Sister    Cancer Maternal Aunt    Diabetes Maternal Aunt    Cancer Paternal Aunt    Hypertension Sister    Varicose Veins Sister    Social History   Socioeconomic History   Marital status: Single    Spouse name: Not on file   Number of children: Not on file   Years of education: Not on file   Highest education level: Not on file  Occupational History   Not on file  Tobacco Use   Smoking status: Never    Passive exposure: Never   Smokeless tobacco: Never  Vaping Use   Vaping Use: Never used  Substance and Sexual Activity   Alcohol use: Never   Drug use: Never   Sexual activity: Not Currently    Birth  control/protection: None  Other Topics Concern   Not on file  Social History Narrative   Not on file   Social Determinants of Health   Financial Resource Strain: Low Risk    Difficulty of Paying Living Expenses: Not hard at all  Food Insecurity: No Food Insecurity   Worried About Programme researcher, broadcasting/film/video in the Last Year: Never true   Ran Out of Food in the Last Year: Never true  Transportation Needs: No Transportation Needs   Lack of Transportation (Medical): No   Lack of Transportation (Non-Medical): No  Physical Activity: Insufficiently Active   Days of Exercise per Week: 4 days   Minutes of Exercise per Session: 30 min  Stress: No Stress Concern Present   Feeling of Stress : Not at all  Social Connections: Socially Isolated   Frequency of Communication with Friends and Family: Never   Frequency of Social Gatherings with Friends and Family: Never   Attends Religious Services: Never   Database administrator or Organizations: No   Attends Engineer, structural: Never   Marital Status: Never married    Tobacco Counseling Counseling given: Not Answered   Clinical Intake:  Pre-visit preparation completed: Yes  Pain : No/denies pain     BMI - recorded: 22.96 Nutritional Status: BMI of 19-24  Normal Nutritional Risks: Nausea/ vomitting/ diarrhea (diarrhea related to metformin) Diabetes: Yes CBG done?: No Did pt. bring in CBG monitor from home?: No  How often do you need to have someone help you when you read instructions, pamphlets, or other written materials from your doctor or pharmacy?: 1 - Never  Nutrition Risk Assessment:  Has the patient had any N/V/D within the last 2 months?  Yes - diarrhea related to starting metformin Does the patient have any non-healing wounds?  No  Has the patient had any unintentional weight loss or weight gain?  No   Diabetes:  Is the patient diabetic?  Yes  If diabetic, was a CBG obtained today?  No  Did the patient bring in  their glucometer from home?  No  How often do you monitor your CBG's? Twice daily.   Financial Strains and Diabetes Management:  Are you having any financial strains with the device, your supplies or your medication? No .  Does the patient want to be seen by Chronic Care Management for management of their diabetes?  No  Would the patient like to be referred to a Nutritionist or for Diabetic Management?  No   Diabetic Exams:  Diabetic Eye Exam: Overdue for diabetic eye exam. Pt has been advised about the importance in completing this exam. Pt scheduled for 08/17/21  Diabetic Foot Exam: Completed  07/26/21.   Interpreter Needed?: No  Information entered by :: Reather Littler LPN   Activities of Daily Living In your present state of health, do you have any difficulty performing the following activities: 08/11/2021  Hearing? Y  Vision? Y  Difficulty concentrating or making decisions? N  Walking or climbing stairs? N  Dressing or bathing? N  Doing errands, shopping? Y  Comment pt does not drive  Preparing Food and eating ? N  Using the Toilet? N  In the past six months, have you accidently leaked urine? N  Do you have problems with loss of bowel control? N  Managing your Medications? N  Managing your Finances? N  Housekeeping or managing your Housekeeping? N  Some recent data might be hidden    Patient Care Team: Reubin Milan, MD as PCP - General (Internal Medicine) Lockie Mola, MD as Referring Physician (Ophthalmology)  Indicate any recent Medical Services you may have received from other than [facility] providers in the past year (date may be approximate).     Assessment:   This is a routine wellness examination for Chasady.  Hearing/Vision screen Hearing Screening - Comments:: Pt c/o mild hearing difficulty; scheduled for hearing evaluation later this month Vision Screening - Comments:: Annual vision screenings done by [facility]  Dietary issues and exercise  activities discussed: Current Exercise Habits: Home exercise routine, Type of exercise: walking, Time (Minutes): 30, Frequency (Times/Week): 4, Weekly Exercise (Minutes/Week): 120, Intensity: Mild, Exercise limited by: neurologic condition(s)   Goals Addressed   None    Depression Screen PHQ 2/9 Scores 08/11/2021 07/26/2021 06/02/2021  PHQ - 2 Score 0 0 6  PHQ- 9 Score 0 0 17    Fall Risk Fall Risk  08/11/2021 07/26/2021 06/02/2021  Falls in the past year? 0 0 0  Number falls in past yr: 0 0 0  Injury with Fall? 0 0 0  Risk for fall due to : No Fall Risks No Fall Risks -  Follow up Falls prevention discussed Falls evaluation completed Falls evaluation completed    FALL RISK PREVENTION PERTAINING TO THE HOME:  Any stairs in or around the home? Yes  If so, are there any without handrails? No  Home free of loose throw rugs in walkways, pet beds, electrical cords, etc? Yes  Adequate lighting in your home to reduce risk of falls? Yes   ASSISTIVE DEVICES UTILIZED TO PREVENT FALLS:  Life alert? No  Use of a cane, walker or w/c? No  Grab bars in the bathroom? Yes  Shower chair or bench in shower? Yes  Elevated toilet seat or a handicapped toilet? No   TIMED UP AND GO:  Was the test performed? Yes .  Length of time to ambulate 10 feet: 6 sec.   Gait steady and fast without use of assistive device  Cognitive Function: Cognitive status assessed by direct observation. Patient is scheduled with neurology for ongoing assessment s/p CVA.           Immunizations Immunization History  Administered Date(s) Administered   PNEUMOCOCCAL CONJUGATE-20 07/26/2021    TDAP status: Due, Education has been provided regarding the importance of this vaccine. Advised may receive this vaccine at local pharmacy or Health Dept. Aware to provide a copy of the vaccination record if obtained from local pharmacy or Health Dept. Verbalized acceptance and understanding.  Flu Vaccine status: Declined,  Education has been provided regarding the importance of this vaccine but patient still declined. Advised may receive this vaccine  at local pharmacy or Health Dept. Aware to provide a copy of the vaccination record if obtained from local pharmacy or Health Dept. Verbalized acceptance and understanding.  Pneumococcal vaccine status: Up to date  Covid-19 vaccine status: Declined, Education has been provided regarding the importance of this vaccine but patient still declined. Advised may receive this vaccine at local pharmacy or Health Dept.or vaccine clinic. Aware to provide a copy of the vaccination record if obtained from local pharmacy or Health Dept. Verbalized acceptance and understanding.  Qualifies for Shingles Vaccine? Yes   Zostavax completed No   Shingrix Completed?: No.    Education has been provided regarding the importance of this vaccine. Patient has been advised to call insurance company to determine out of pocket expense if they have not yet received this vaccine. Advised may also receive vaccine at local pharmacy or Health Dept. Verbalized acceptance and understanding.  Screening Tests Health Maintenance  Topic Date Due   OPHTHALMOLOGY EXAM  Never done   Zoster Vaccines- Shingrix (1 of 2) Never done   DEXA SCAN  Never done   PNA vac Low Risk Adult (1 of 2 - PCV13) 01/25/2017   INFLUENZA VACCINE  Never done   COVID-19 Vaccine (1) 08/11/2021 (Originally 01/25/1956)   TETANUS/TDAP  12/05/2021 (Originally 01/25/1971)   MAMMOGRAM  06/02/2022 (Originally 01/25/2002)   COLONOSCOPY (Pts 45-5yrs Insurance coverage will need to be confirmed)  06/02/2022 (Originally 01/25/1997)   HEMOGLOBIN A1C  01/26/2022   FOOT EXAM  07/26/2022   URINE MICROALBUMIN  07/26/2022   Hepatitis C Screening  Completed   HPV VACCINES  Aged Out    Health Maintenance  Health Maintenance Due  Topic Date Due   OPHTHALMOLOGY EXAM  Never done   Zoster Vaccines- Shingrix (1 of 2) Never done   DEXA SCAN  Never  done   PNA vac Low Risk Adult (1 of 2 - PCV13) 01/25/2017   INFLUENZA VACCINE  Never done    Colorectal cancer screening: pt declined referral to gastroenterology for colonoscopy  Mammogram status: pt declined  Bone Density status: Ordered today. Pt provided with contact info and advised to call to schedule appt.  Lung Cancer Screening: (Low Dose CT Chest recommended if Age 67-80 years, 30 pack-year currently smoking OR have quit w/in 15years.) does not qualify.   Additional Screening:  Hepatitis C Screening: does qualify; Completed 07/26/21  Vision Screening: Recommended annual ophthalmology exams for early detection of glaucoma and other disorders of the eye. Is the patient up to date with their annual eye exam?  No  Who is the provider or what is the name of the office in which the patient attends annual eye exams? [facility] - scheduled next week.   Dental Screening: Recommended annual dental exams for proper oral hygiene  Community Resource Referral / Chronic Care Management: CRR required this visit?  No   CCM required this visit?  No      Plan:     I have personally reviewed and noted the following in the patient's chart:   Medical and social history Use of alcohol, tobacco or illicit drugs  Current medications and supplements including opioid prescriptions. Patient is not currently taking opioid prescriptions. Functional ability and status Nutritional status Physical activity Advanced directives List of other physicians Hospitalizations, surgeries, and ER visits in previous 12 months Vitals Screenings to include cognitive, depression, and falls Referrals and appointments  In addition, I have reviewed and discussed with patient certain preventive protocols, quality metrics, and  best practice recommendations. A written personalized care plan for preventive services as well as general preventive health recommendations were provided to patient.     Reather LittlerKasey  Valencia Kassa, LPN   3/2/35579/06/2021   Nurse Notes: pt accompanied to visit today by her brother in law [place]. Pt has had a couple of episodes of diarrhea since starting metformin but aware that could be possible side effect; advised to contact office if needed.

## 2021-08-12 ENCOUNTER — Ambulatory Visit: Payer: Medicare Other | Admitting: Physical Therapy

## 2021-08-12 ENCOUNTER — Other Ambulatory Visit: Payer: Self-pay

## 2021-08-12 ENCOUNTER — Ambulatory Visit: Payer: Medicare Other | Admitting: Speech Pathology

## 2021-08-12 ENCOUNTER — Encounter: Payer: Self-pay | Admitting: Physical Therapy

## 2021-08-12 ENCOUNTER — Ambulatory Visit: Payer: Medicare Other | Admitting: Occupational Therapy

## 2021-08-12 ENCOUNTER — Encounter: Payer: Self-pay | Admitting: Occupational Therapy

## 2021-08-12 DIAGNOSIS — M6281 Muscle weakness (generalized): Secondary | ICD-10-CM

## 2021-08-12 DIAGNOSIS — R4701 Aphasia: Secondary | ICD-10-CM

## 2021-08-12 DIAGNOSIS — R262 Difficulty in walking, not elsewhere classified: Secondary | ICD-10-CM

## 2021-08-12 DIAGNOSIS — R41841 Cognitive communication deficit: Secondary | ICD-10-CM

## 2021-08-12 DIAGNOSIS — R269 Unspecified abnormalities of gait and mobility: Secondary | ICD-10-CM | POA: Diagnosis not present

## 2021-08-12 DIAGNOSIS — R278 Other lack of coordination: Secondary | ICD-10-CM

## 2021-08-12 DIAGNOSIS — R2681 Unsteadiness on feet: Secondary | ICD-10-CM

## 2021-08-12 NOTE — Therapy (Signed)
[facility] MAIN [facility] SERVICES [address] [place], [place], [address] Phone: [phone]   Fax:  [phone]  Speech Language Pathology Treatment  Patient Details  Name: Laura [name] MRN: [id number] Date of Birth: 09-25-1951 Referring Provider (SLP): Mariam Dollar, PA-C   Encounter Date: 08/12/2021   End of Session - 08/12/21 1816     Visit Number 8    Number of Visits 25    Date for SLP Re-Evaluation 10/04/21    Authorization Type [employer]    SLP Start Time 1600    SLP Stop Time  1700    SLP Time Calculation (min) 60 min    Activity Tolerance Patient tolerated treatment well             Past Medical History:  Diagnosis Date   Acute CVA (cerebrovascular accident) (HCC) 05/24/2021   Diabetes mellitus without complication (HCC) 05/23/2021   Hyperlipidemia 05/23/2021   Hypertension 05/23/2021   Left forearm fracture    in high school--was casted   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   Foot fracture, no surgery   TONSILLECTOMY      There were no vitals filed for this visit.   Subjective Assessment - 08/12/21 1805     Subjective Completed sentences for homework    Patient is accompained by: Family member   sister   Currently in Pain? No/denies                   ADULT SLP TREATMENT - 08/12/21 1814       General Information   Behavior/Cognition Alert;Cooperative    HPI Laura [name] is a 70 year old female with past medical hx noted for DM II, HLD,   admitted at [facility] on [facility]/23/21 with garbled speech, right facial droop, vision changes and  RUE weakness. MRI brain showed acute infarct in left caudate head and anterior lentiform nucleus, chronic microvascular ischemia. Resulting transcortical motor aphasia, dysarthria and cognitive deficits. Blood sugar was 372 on admission. Admitted to [facility]  6/22- 06/02/21.      Cognitive-Linquistic Treatment   Treatment focused on Aphasia;Cognition    Skilled Treatment Reviewed home exercises; patient wished to continue targeting writing today. Occasional min-mod cues to generate subjects/objects; wrote SVO sentences with additional details with min-mod cues and extended time. Reading/listening comprehension of simple news article and answered 3/3 questions accurately.      Assessment / Recommendations / Plan   Plan Continue with current plan of care      Progression Toward Goals   Progression toward goals Progressing toward goals                SLP Short Term Goals - 07/07/21 1755       SLP SHORT TERM GOAL #1   Title Patient will name average of 8+ items for simple /concrete categories.    Time 10    Period --   sessions   Status New    Target Date 08/18/21      SLP SHORT TERM GOAL #2   Title Patient will generate fluent, grammatical, and cogent sentence to complete concrete linguistic tasks with 80% accuracy.    Time 10    Period --   sessions   Status New    Target Date 08/18/21      SLP SHORT TERM GOAL #3  Title The patient will decode and read aloud sentences of varying length and complexity with 80% accuracy.    Time 10    Period --   sessions   Status New    Target Date 08/18/21      SLP SHORT TERM GOAL #4   Title Patient will complete standardized assessment of cognitive communication skills.    Time 2    Period Weeks    Status New    Target Date 07/21/21              SLP Long Term Goals - 07/07/21 1806       SLP LONG TERM GOAL #1   Title Patient will use compensations for aphasia and dysarthria in 10 minutes moderately complex conversation to express thoughts and opinions with listener comprehension at least 90%.    Time 12    Period Weeks    Status New    Target Date 10/05/21      SLP LONG TERM GOAL #2   Title Patient will write or verbally sequence steps to complete simple to mod complex  personally relevant tasks (ADLs, IADLs).    Time 12    Period Weeks    Status New    Target Date 10/05/21      SLP LONG TERM GOAL #3   Title Pt will read paragraph-level materials relating to personal interests, financial and medical matters demonstrating comprehension of details >90% accuracy.    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 Cognitive testing completed showing deficits in attention, memory.  Patient initiated conversation more often in session today.  SLP suspects hearing loss and well as possible auditory processing deficits impacting comprehension vs receptive language; audiologic evaluation is scheduled. I recommend skilled ST to address aphasia, dysarthria, and cognitive-communication deficits in order to improve ability to communicate wants and needs and increase pt independence, as pt goal is to return to living independently.    Speech Therapy Frequency 2x / week    Duration 12 weeks    Treatment/Interventions Language facilitation;Environmental controls;Cueing hierarchy;SLP instruction and feedback;Compensatory techniques;Cognitive reorganization;Functional tasks;Compensatory strategies;Internal/external aids;Multimodal communcation approach;Patient/family education    Potential to Achieve Goals Good    SLP Home Exercise Plan TBD    Consulted and Agree with Plan of Care Patient;Family member/caregiver             Patient will benefit from skilled therapeutic intervention in order to improve the following deficits and impairments:   Aphasia  Cognitive communication deficit    Problem List Patient Active Problem List   Diagnosis Date Noted   Hyperlipidemia associated with type 2 diabetes mellitus (HCC) 06/02/2021   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 [place], [place], [address] Phone: [phone]   Fax:  [phone]   Name: Laura [name] MRN: [id number] Date of Birth: 1951/02/26

## 2021-08-12 NOTE — Patient Instructions (Signed)
TEST Outcome (8/1)   Outcome (9/8) Interpretation  5 times sit<>stand 12 sec  >69 yo, >15 sec indicates increased risk for falls  10 meter walk test              1.15   m/s  1.0 m/s indicates increased risk for falls; limited community ambulator  6 minute walk test      1398 Feet    1460 feet 1000 feet is community Forensic scientist Assessment 46/56    52/56 <36/56 (100% risk for falls), 37-45 (80% risk for falls); 46-51 (>50% risk for falls); 52-55 (lower risk <25% of falls)  DGI 21/24     FOTO Survey  70%  65% Self reported functional level; goal is >79%  Mini Best  15/28 (8/18)    18/28   Goal is >22/28

## 2021-08-12 NOTE — Therapy (Signed)
[place] MAIN [facility] SERVICES [address] [place], [place], [address] Phone: [phone]   Fax:  [phone]  Occupational Therapy Treatment  Patient Details  Name: Laura [name] MRN: [id number] Date of Birth: Nov 10, 1951 No data recorded  Encounter Date: 08/12/2021   OT End of Session - 08/12/21 1530     Visit Number 12    Number of Visits 24    Date for OT Re-Evaluation 10/04/21    Authorization Type Progress report starting 08/04/2021    OT Start Time 1430    OT Stop Time 9509    OT Time Calculation (min) 45 min    Activity Tolerance Patient tolerated treatment well    Behavior During Therapy Laura [name] for tasks assessed/performed             Past Medical History:  Diagnosis Date   Acute CVA (cerebrovascular accident) (Laura [name]) 05/24/2021   Diabetes mellitus without complication (Laura [name]) 32/67/1245   Hyperlipidemia 05/23/2021   Hypertension 05/23/2021   Left forearm fracture    in high school--was casted   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   Foot fracture, no Laura   TONSILLECTOMY      There were no vitals filed for this visit.   Subjective Assessment - 08/12/21 1529     Subjective  Pt reports she has been doing better, saw improvements with her measurements last time.  Using hand more at home for tasks.    Patient is accompanied by: Family member    Pertinent History Per chart, Laura [name] is a 70 y.o. female in relatively good health but no medical care for years who was admitted on 05/23/2021 to [name] with garbled speech, right facial droop, vision changes and right upper committee weakness.  Per reports she started feeling weak on 06/14 but refused to come to the hospital.  MRI brain done revealing acute nonhemorrhagic infarct in left caudate head and anterior lentiform nucleus as well as chronic microvascular  ischemia mildly advanced for age.  CTA head/neck was negative for AVM, aneurysm or stenosis.  She was noted to have low vitamin B12 level at 97 and was started on supplementation.  Stroke was felt to be due to small vessel disease and neurology recommended DAPT x3 weeks followed by aspirin alone.  She continued to be limited by weakness with dysarthria, tachycardia with activity, balance deficits as well as motor planning deficits with ADLs.  Laura [name] was recommended due to functional decline from 05-26-2021 to 06-02-2021.    Patient Stated Goals Pt would like to go home and be as independent as possible    Currently in Pain? No/denies            OT TREATMENT    Neuro muscular re-education:  Pt. worked on right  [facility] skills using the Laura [name] Task. Pt. worked on sustaining grasp on the resistive tweezers while grasping this sticks, and moving them from a horizontal position to a vertical position to prepare for placing them into the pegboard placed on a flat tabletop surface. Pt. required verbal cues, and cues for visual demonstration for wrist position, and hand pattern when placing them into the pegboard.  Pt. worked on using the tweezers to remove 1/4" small cubes from a narrow neck container placed at various angles.  Therapeutic Exercise:  Pt. worked on the Laura [name] for  8 min. with constant monitoring of the BUEs. Pt. worked on changing, and alternating forward reverse for 2 min. rest breaks were required.  Pt. worked on level 4.5.  Pt. continues to make excellent progress with RUE functioning, and Laura [name] skills. Pt. required cues, and cues for visual demonstration to maintain 3pt. grasp on the tweezers. Pt.presented with difficulty securing a grasp on the 1" thin stick when attempting to grasp them from the slick surface of the dish. Pt. was able to secure a grasp easier once the pegs were placed on a nonskid surface. Pt. required cues at times to position tweezers in to the narrow neck  bottle when grasping the cubes. Pt. Continues to work on improving right hand function skills in order to be able to manipulate small objects during ADL, and IADL tasks.                        OT Education - 08/12/21 1530     Education Details Laura [name], strengthening    Person(s) Educated Patient;Caregiver(s)    Methods Explanation;Verbal cues;Handout    Comprehension Verbalized understanding;Need further instruction;Verbal cues required                 OT Long Term Goals - 08/04/21 1321       OT LONG TERM GOAL #1   Title Pt will be independent with home exercise program.    Baseline 08/04/2021: Independent with Theraputty HEP.    Time 12    Period Weeks    Status Partially Met    Target Date 10/04/21      OT LONG TERM GOAL #2   Title Pt will improve FOTO score to 73 or above to demonstrate a clinically relevant change to impact greater independence in necessary daily tasks.    Baseline 08/04/2021: 65 Eval score of 61    Time 12    Period Weeks    Status New    Target Date 10/04/21      OT LONG TERM GOAL #3   Title Pt will complete light meal prep with modified independence.    Baseline 08/04/2021: Pt. is now preparing lunch sandwiches  independently. pt. is able to use the microwave. Pt. requires assist from family    Time 12    Period Weeks    Status Partially Met    Target Date 10/04/21      OT LONG TERM GOAL #4   Title Pt will complete laundry tasks with modified independence    Baseline 08/04/2021: Pt. is able to wash, transfer, and fold laundry. Pt. has improved, however requires occ. assist with the laundry controls. Eval: requires assist from family    Time 12    Period Weeks    Status New    Target Date 10/04/21      OT LONG TERM GOAL #5   Title Pt will complete check writing with modified independence.    Baseline 08/04/2021: Pt. was able to independently complete, and has her sister review it. Eval:  difficulty    Time 6     Period Weeks    Status Achieved      OT LONG TERM GOAL #6   Title Pt will demonstrate handwriting with legibility 80% or greater to write demographics and signature  on important paper.    Baseline 08/04/2021: Writing 70-80% legibility. EvaL difficulty with poor legibility    Time 6    Period Weeks    Status On-going  Target Date 08/23/21      OT LONG TERM GOAL #7   Title Pt will complete basic self care tasks with modified independence.    Baseline increased time and occasional assist    Time 6    Period Weeks    Status Achieved      OT LONG TERM GOAL #8   Title Pt to improve coordination by reduction of time on 9 hole peg test by 10 secs to assist with performing coordination tasks with writing, buttons, zippers.    Baseline 08/04/2021: 46 sec. Eval: right 52 secs    Time 12    Period Weeks    Status On-going    Target Date 10/04/21      OT LONG TERM GOAL  #9   TITLE Pt will improve right grip strength by 10# to open jars, containers with modified independence.    Baseline 08/04/2021: 39# Eval: right 28#    Time 12    Period Weeks    Status On-going    Target Date 10/04/21                   Plan - 08/12/21 1530     Clinical Impression Statement Pt. continues to make excellent progress with RUE functioning, and [name] skills. Pt. required cues cues, and visual demonstration to maintain 3pt. grasp on the tweezers. Pt.presented with difficulty securing a grasp on the 1" thin stick when attempting to grasp them from the slick surface of the dish. Pt. was able to secure a grasp easier once the pegs were placed on a nonskid surface. Pt. Required cues at times to position tweezers in to the narrow neck bottle when grasping the cubes. Pt. Continues to work on improving right hand function skills in order to be able to manipulate small objects during ADL, and IADL tasks.   OT Occupational Profile and History Detailed Assessment- Review of Records and additional review of  physical, cognitive, psychosocial history related to current functional performance    Occupational performance deficits (Please refer to evaluation for details): ADL's;IADL's;Leisure    Body Structure / Function / Physical Skills ADL;Dexterity;Flexibility;Strength;Balance;Coordination;FMC;IADL;UE functional use;Mobility    Psychosocial Skills Environmental  Adaptations;Habits;Routines and Behaviors    Rehab Potential Good    Clinical Decision Making Limited treatment options, no task modification necessary    Comorbidities Affecting Occupational Performance: May have comorbidities impacting occupational performance    Modification or Assistance to Complete Evaluation  No modification of tasks or assist necessary to complete eval    OT Frequency 2x / week    OT Duration 12 weeks    OT Treatment/Interventions Self-care/ADL training;Cryotherapy;Therapeutic exercise;DME and/or AE instruction;Functional Mobility Training;Cognitive remediation/compensation;Balance training;Neuromuscular education;Manual Therapy;Splinting;Visual/perceptual remediation/compensation;Moist Heat;Contrast Bath;Therapeutic [name];Patient/family education    Consulted and Agree with Plan of Care Patient;Family member/caregiver             Patient will benefit from skilled therapeutic intervention in order to improve the following deficits and impairments:   Body Structure / Function / Physical Skills: ADL, Dexterity, Flexibility, Strength, Balance, Coordination, FMC, IADL, UE functional use, Mobility   Psychosocial Skills: Environmental  Adaptations, Habits, Routines and Behaviors   Visit Diagnosis: Muscle weakness (generalized)  Other lack of coordination    Problem List Patient Active Problem List   Diagnosis Date Noted   Hyperlipidemia associated with type 2 diabetes mellitus ([place]) 06/02/2021   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 [place], [place], [address] Phone: [phone]   Fax:  [phone]  Name: Laura [name] MRN: [id number] Date of Birth: [birth year]

## 2021-08-12 NOTE — Therapy (Signed)
[place] [facility] MAIN [facility] SERVICES [address] [place], [place], [address] Phone: [phone]   Fax:  [phone]  Physical Therapy Treatment Physical Therapy Progress Note   Dates of reporting period  07/05/21   to   08/12/21   Patient Details  Name: Laura [name] MRN: [id number] Date of Birth: July 29, 1951 Referring Provider (PT): Carlis Abbott Drema Pry, MD   Encounter Date: 08/12/2021   PT End of Session - 08/12/21 1343     Visit Number 10    Number of Visits 25    Date for PT Re-Evaluation 09/27/21    Authorization Type Medicare (traditional)    Authorization Time Period 07/05/21-09/27/21    PT Start Time 1344    PT Stop Time 1430    PT Time Calculation (min) 46 min    Equipment Utilized During Treatment Gait belt    Activity Tolerance Patient tolerated treatment well    Behavior During Therapy [facility] for tasks assessed/performed             Past Medical History:  Diagnosis Date   Acute CVA (cerebrovascular accident) (HCC) 05/24/2021   Diabetes mellitus without complication (HCC) 05/23/2021   Hyperlipidemia 05/23/2021   Hypertension 05/23/2021   Left forearm fracture    in high school--was casted   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   Foot fracture, no surgery   TONSILLECTOMY      There were no vitals filed for this visit.   Subjective Assessment - 08/12/21 1403     Subjective Patient reports not feeling well after taking Metformin, has been having some diarrhea; She reports taking it easy;    Patient is accompained by: Family member   sister   Pertinent History Pt presents to evaluation with her sister as pt has difficulty with speech. Pt reports she has not been using an AD. Pt confirms she was admitted to hospital d/t CVA on 05/23/2021. Pt sister reports they are unsure of the onset of the stroke. Pt's sister  reports she checked on her sister after she received a birthday card and noticed her sister's signature looked abnormal. She called the pt and reports her voice was slurred. When she checked on her she said she had R side facial droop, difficulty with R hand. Pt's sister reports pt initially declined going to the ED but agreed to go the following morning where she was diagnosed with a CVA. Once d/c from the hospital the pt went to rehab for a week. Pt was living alone, independently prior to her stroke, but now lives with her sister and her sister's husband. Pt's sister says her husband walks with pt 15 mins 2x/day. Pt's sister reports after pt has been walking for a while she will start to drag her L leg. She says this has improved with time. Pt now requires some assistance with ADLs, including dressing, making her bed, and self-feeding, showering. Pt's sister reports pt now has vision issues as well as balance problems. She reports pt loses balance with dressing herself. Other PMH includes: HTN, DMII, Vitamin b12 deficiency, HLD, Vitamin D deficiency. Pt also with dysphasia, aphasia, weakness d/t recent stroke.    Limitations Walking;Writing;Standing;Lifting;House hold activities;Reading    How long can you sit comfortably? not affected    How long can you stand comfortably? 15 min    How long  can you walk comfortably? 15 minutes    Diagnostic tests per chart MRI brain revealed acute nonhemorrhagic infarct in L caudate had and anterior lentiform nucelus and chronic microvascular   ischemia mildly advanced for age. CTA head neck negative for AVM, aneurysms or stenosis. 2D echo showing EF 65 to 70% with borderline LVH.    Patient Stated Goals Pt would like to go home again    Currently in Pain? No/denies    Multiple Pain Sites No                [facility] PT Assessment - 08/12/21 0001       Observation/Other Assessments   Focus on Therapeutic Outcomes (FOTO)  65% (more impaired than 07/05/21 which was  70%)      Berg Balance Test   Sit to Stand Able to stand without using hands and stabilize independently    Standing Unsupported Able to stand safely 2 minutes    Sitting with Back Unsupported but Feet Supported on Floor or Stool Able to sit safely and securely 2 minutes    Stand to Sit Sits safely with minimal use of hands    Transfers Able to transfer safely, minor use of hands    Standing Unsupported with Eyes Closed Able to stand 10 seconds safely    Standing Unsupported with Feet Together Able to place feet together independently and stand 1 minute safely    From Standing, Reach Forward with Outstretched Arm Can reach confidently >25 cm (10")    From Standing Position, Pick up Object from Floor Able to pick up shoe safely and easily    From Standing Position, Turn to Look Behind Over each Shoulder Looks behind from both sides and weight shifts well    Turn 360 Degrees Able to turn 360 degrees safely in 4 seconds or less    Standing Unsupported, Alternately Place Feet on Step/Stool Able to stand independently and safely and complete 8 steps in 20 seconds    Standing Unsupported, One Foot in Front Able to take small step independently and hold 30 seconds    Standing on One Leg Able to lift leg independently and hold equal to or more than 3 seconds    Total Score 52    Berg comment: low risk for falls, improved from 46/56 on 07/05/21             TREATMENT: PT instructed patient in outcome measures, see above/below:  TEST Outcome (8/1)   Outcome (9/8) Interpretation  5 times sit<>stand 12 sec  >60 yo, >15 sec indicates increased risk for falls  10 meter walk test              1.15   m/s  1.0 m/s indicates increased risk for falls; limited community ambulator  6 minute walk test      1398 Feet    1460 feet 1000 feet is community Forensic scientistambulator  Berg Balance Assessment 46/56    52/56 <36/56 (100% risk for falls), 37-45 (80% risk for falls); 46-51 (>50% risk for falls); 52-55 (lower risk <25%  of falls)  DGI 21/24     FOTO Survey  70%  65% Self reported functional level; goal is >79%  Mini Best  15/28 (8/18)    18/28   Goal is >22/28        Patient's condition has the potential to improve in response to therapy. Maximum improvement is yet to be obtained. The anticipated improvement is attainable and reasonable in a  generally predictable time.  Patient reports adherence with HEP, however this week she hasn't been feeling well and as a result hasn't done as much; She continues to have difficulty with cognition which does impair dual task activities.                   PT Education - 08/12/21 1343     Education Details progress towards goals/exercise/balance; HEP    Person(s) Educated Patient    Methods Explanation;Verbal cues    Comprehension Verbalized understanding;Returned demonstration;Verbal cues required;Need further instruction              PT Short Term Goals - 08/12/21 1402       PT SHORT TERM GOAL #1   Title Patient will be independent in home exercise program to improve strength/mobility for better functional independence with ADLs.    Baseline 07/05/2021: to be initiated next visit; 9/8: has been holding off due to medication side effect;    Time 6    Status On-going    Target Date 08/16/21               PT Long Term Goals - 08/12/21 1358       PT LONG TERM GOAL #1   Title Patient will increase FOTO score to equal to or greater than 79 to demonstrate statistically significant improvement in mobility and quality of life.    Baseline 8/1: 70    Time 12    Period Weeks    Status On-going    Target Date 09/27/21      PT LONG TERM GOAL #2   Title Patient will increase Berg Balance score by > 6 points to demonstrate decreased fall risk during functional activities.    Baseline 8/1: 46/56    Time 12    Period Weeks    Status On-going    Target Date 09/27/21      PT LONG TERM GOAL #3   Title The pt will ambulate at least 1700 ft  during to demonstrate return toward age norm gait ability.    Baseline 8/1: 1398 ft no AD, CGA; 9/8: 1460 feet;    Time 12    Period Weeks    Status On-going    Target Date 09/27/21      PT LONG TERM GOAL #4   Title Pt to demonstrate improvement in Mini-BesTest score to 22/28 or higher to represent improved balance and reduced falls risk.    Baseline On 07/22/21: 15/28, 9/8: 18/28    Time 8    Period Weeks    Status New                   Plan - 08/12/21 1439     Clinical Impression Statement Patient motivated and participated well within session. She was instructed in outcome measures to address progress towards goals. Patient does exhibit improvement in balance and gait ability. Despite improvement she is still considered at an increased risk for falls as evidenced by MiniBest. Patient exhibits most unsteadiness when on compliant surfaces with immediate loss of balance on airex pad/incline. She also had difficulty with tandem stance/narrow base of support. Patient would benefit from additional skilled PT intervention to improve balance and mobility;    Personal Factors and Comorbidities Comorbidity 1;Comorbidity 2;Comorbidity 3+;Sex;Age    Comorbidities HTN, DMII, dysphasia, aphasia, weakness,    Examination-Activity Limitations Locomotion Level;Stairs;Dressing;Bathing;Reach Overhead;Lift;Caring for Others    Examination-Participation Restrictions Driving;Medication Management;Personal Finances;Cleaning;Meal Prep;Yard Work;Community Activity;Shop;Other;Laundry  Stability/Clinical Decision Making Evolving/Moderate complexity    Rehab Potential Good    PT Frequency 2x / week    PT Duration 12 weeks    PT Treatment/Interventions ADLs/Self Care Home Management;Biofeedback;Canalith Repostioning;Aquatic Therapy;Cryotherapy;Moist Heat;DME Instruction;Gait training;Stair training;Functional mobility training;Therapeutic activities;Therapeutic exercise;Balance training;Neuromuscular  re-education;Cognitive remediation;Patient/family education;Orthotic Fit/Training;Manual techniques;Passive range of motion;Energy conservation;Taping;Splinting;Vestibular;Visual/perceptual remediation/compensation;Joint Manipulations    PT Next Visit Plan strength, endurance, balance and gait interventions    PT Home Exercise Plan No changes to HEP this visit    Consulted and Agree with Plan of Care Patient;Family member/caregiver    Family Member Consulted sister             Patient will benefit from skilled therapeutic intervention in order to improve the following deficits and impairments:  Abnormal gait, Decreased activity tolerance, Decreased endurance, Decreased range of motion, Decreased strength, Hypomobility, Improper body mechanics, Decreased balance, Decreased coordination, Decreased mobility, Difficulty walking, Impaired flexibility, Postural dysfunction, Impaired vision/preception, Impaired sensation  Visit Diagnosis: Muscle weakness (generalized)  Other lack of coordination  Unsteadiness on feet  Abnormality of gait and mobility  Difficulty in walking, not elsewhere classified     Problem List 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) 06/02/2021   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 (CVA) 06/02/2021   Vitamin D deficiency 06/02/2021   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

## 2021-08-16 ENCOUNTER — Other Ambulatory Visit: Payer: Self-pay

## 2021-08-16 ENCOUNTER — Ambulatory Visit: Payer: Medicare Other | Admitting: Speech Pathology

## 2021-08-16 DIAGNOSIS — R4701 Aphasia: Secondary | ICD-10-CM

## 2021-08-16 DIAGNOSIS — R41841 Cognitive communication deficit: Secondary | ICD-10-CM

## 2021-08-16 DIAGNOSIS — R269 Unspecified abnormalities of gait and mobility: Secondary | ICD-10-CM | POA: Diagnosis not present

## 2021-08-17 ENCOUNTER — Ambulatory Visit: Payer: Medicare Other

## 2021-08-17 ENCOUNTER — Encounter: Payer: Medicare Other | Admitting: Occupational Therapy

## 2021-08-17 DIAGNOSIS — I6300 Cerebral infarction due to thrombosis of unspecified precerebral artery: Secondary | ICD-10-CM

## 2021-08-17 DIAGNOSIS — R278 Other lack of coordination: Secondary | ICD-10-CM

## 2021-08-17 DIAGNOSIS — R269 Unspecified abnormalities of gait and mobility: Secondary | ICD-10-CM | POA: Diagnosis not present

## 2021-08-17 DIAGNOSIS — M6281 Muscle weakness (generalized): Secondary | ICD-10-CM

## 2021-08-17 LAB — HM DIABETES EYE EXAM

## 2021-08-17 NOTE — Therapy (Signed)
[place] MAIN [facility] SERVICES [address] [place], [place], [address] Phone: [phone]   Fax:  [phone]  Occupational Therapy Treatment  Patient Details  Name: Laura [name] MRN: [id number] Date of Birth: 1951-04-16 No data recorded  Encounter Date: 08/17/2021   OT End of Session - 08/17/21 2108     Visit Number 13    Number of Visits 24    Date for OT Re-Evaluation 10/04/21    Authorization Type Progress report starting 08/04/2021    OT Start Time 33    OT Stop Time 1645    OT Time Calculation (min) 45 min    Activity Tolerance Patient tolerated treatment well    Behavior During Therapy [facility] for tasks assessed/performed             Past Medical History:  Diagnosis Date   Acute CVA (cerebrovascular accident) ([place]) 05/24/2021   Diabetes mellitus without complication ([place]) 50/35/4656   Hyperlipidemia 05/23/2021   Hypertension 05/23/2021   Left forearm fracture    in high school--was casted   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   Foot fracture, no surgery   TONSILLECTOMY      There were no vitals filed for this visit.   Subjective Assessment - 08/17/21 2105     Subjective  "The issue of going back home is a sensitive issue because there was hoarding, a lack of hygiene, and a lot of laying on the couch." (per brother-in-law, [name])    Patient is accompanied by: Family member    Pertinent History Per chart, Laura [name] is a 70 y.o. female in relatively good health but no medical care for years who was admitted on 05/23/2021 to [facility] with garbled speech, right facial droop, vision changes and right upper committee weakness.  Per reports she started feeling weak on 06/14 but refused to come to the hospital.  MRI brain done revealing acute nonhemorrhagic infarct in left caudate head and anterior lentiform  nucleus as well as chronic microvascular ischemia mildly advanced for age.  CTA head/neck was negative for AVM, aneurysm or stenosis.  She was noted to have low vitamin B12 level at 97 and was started on supplementation.  Stroke was felt to be due to small vessel disease and neurology recommended DAPT x3 weeks followed by aspirin alone.  She continued to be limited by weakness with dysarthria, tachycardia with activity, balance deficits as well as motor planning deficits with ADLs.  CIR was recommended due to functional decline from 05-26-2021 to 06-02-2021.    Patient Stated Goals Pt would like to go home and be as independent as possible    Currently in Pain? No/denies    Pain Score 0-No pain            Occupational Therapy Treatment: Self Care: Reviewed return home goals and necessary tasks pt would need to be indep with in order to return home.  Per brother-in-law, [name], pt is setting up her own pills in her weekly pill organizer, managing all basic self care with encouragement from sister to maintain hygiene, indep to manage cold meal prep, but vision impairment limits ability to operate sister's microwave, and pt has not yet attempted use of stove.  Pt had eye appt today and was told she has cataracts in both eyes, but will need to wait for neuro  and cardiac follow up d/t her stroke.  OT encouraged pt try a stove top task before next visit with family present to assist with setting dials as needed d/t visual limitations.  OT encouraged caregiver to observe pt complete ingredient set up, meal prep on stove top, clean up, and turning off stove, providing cues only as needed.  Pt/caregiver in agreement.  Advised color contrast for microwave buttons and stove top dials at home.  Pt reports that she knows her dials at home without extra markings, but stated that washer/dryer at good color contrast.    Therapeutic Exercise: Pt participated in standing UBE x 5 min forward rotation working to increase BUE  strength and activity tolerance for self care.  Performed hand gripper with moderate resistance for 3 sets 10 reps for grip strengthening, as well as use of hand gripper to remove 1" pegs from peg board and place into container x3 trials with gripper set at 11.2 lbs.  Addressed digit isolation with rotating pegs between tips of thumb, IF, and LF, rotating 180 and 360 degrees; extra time needed but no dropping of pegs.  Used 2 lb dumbbell for distal RUE strengthening, including pron/sup, wrist flex/ext/radial and ulnar deviation x3 sets 10 reps each.   Response to Treatment: Pt's brother-in-law [name] present today for pt's visit.  [name] verbalized pt's sister and his concerns of pt's return home, newly reporting to OT that pt had hoarding behavior, poor hygiene, and extensive sedentary behavior laying on the couch all day.  Pt also had a dog which passed d/t lack of appropriate care from pt, per [name].  Both sister and brother-in-law report this is a sensitive issue, but they are reluctant to consider pt's return home. Further discussion will be necessary to identify pt's long term plan to ensure OT goals are addressed appropriately in regards to discharge planning.  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  Continued OT will be beneficial to maximize RUE strength and coordination for improving accuracy and efficiency with self care tasks.     OT Education - 08/17/21 2108     Education Details digit isolation exercises    Person(s) Educated Patient;Caregiver(s)    Methods Explanation;Verbal cues;Handout    Comprehension Verbalized understanding;Need further instruction;Verbal cues required                 OT Long Term Goals - 08/04/21 1321       OT LONG TERM GOAL #1   Title Pt will be independent with home exercise program.    Baseline 08/04/2021: Independent with Theraputty HEP.    Time 12    Period Weeks     Status Partially Met    Target Date 10/04/21      OT LONG TERM GOAL #2   Title Pt will improve FOTO score to 73 or above to demonstrate a clinically relevant change to impact greater independence in necessary daily tasks.    Baseline 08/04/2021: 65 Eval score of 61    Time 12    Period Weeks    Status New    Target Date 10/04/21      OT LONG TERM GOAL #3   Title Pt will complete light meal prep with modified independence.    Baseline 08/04/2021: Pt. is now preparing lunch sandwiches  independently. pt. is able to use the microwave. Pt. requires assist from family  Time 12    Period Weeks    Status Partially Met    Target Date 10/04/21      OT LONG TERM GOAL #4   Title Pt will complete laundry tasks with modified independence    Baseline 08/04/2021: Pt. is able to wash, transfer, and fold laundry. Pt. has improved, however requires occ. assist with the laundry controls. Eval: requires assist from family    Time 12    Period Weeks    Status New    Target Date 10/04/21      OT LONG TERM GOAL #5   Title Pt will complete check writing with modified independence.    Baseline 08/04/2021: Pt. was able to independently complete, and has her sister review it. Eval:  difficulty    Time 6    Period Weeks    Status Achieved      OT LONG TERM GOAL #6   Title Pt will demonstrate handwriting with legibility 80% or greater to write demographics and signature  on important paper.    Baseline 08/04/2021: Writing 70-80% legibility. EvaL difficulty with poor legibility    Time 6    Period Weeks    Status On-going    Target Date 08/23/21      OT LONG TERM GOAL #7   Title Pt will complete basic self care tasks with modified independence.    Baseline increased time and occasional assist    Time 6    Period Weeks    Status Achieved      OT LONG TERM GOAL #8   Title Pt to improve coordination by reduction of time on 9 hole peg test by 10 secs to assist with performing coordination tasks with  writing, buttons, zippers.    Baseline 08/04/2021: 46 sec. Eval: right 52 secs    Time 12    Period Weeks    Status On-going    Target Date 10/04/21      OT LONG TERM GOAL  #9   TITLE Pt will improve right grip strength by 10# to open jars, containers with modified independence.    Baseline 08/04/2021: 39# Eval: right 28#    Time 12    Period Weeks    Status On-going    Target Date 10/04/21                   Plan - 08/17/21 2128     Clinical Impression Statement Pt's brother-in-law [name] present today for pt's visit.  [name] verbalized pt's sister and his concerns of pt's return home, newly reporting to OT that pt had hoarding behavior, poor hygiene, and extensive sedentary behavior laying on the couch all day.  Pt also had a dog which passed d/t lack of appropriate care from pt, per [name].  Both sister and brother-in-law report this is a sensitive issue, but they are reluctant to consider pt's return home. Further discussion will be necessary to identify pt's long term plan to ensure OT goals are addressed appropriately in regards to discharge planning.  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  Continued OT will be beneficial to maximize RUE strength and coordination for improving accuracy and efficiency with self care tasks.    OT Occupational Profile and History Detailed Assessment- Review of Records and additional review of physical, cognitive, psychosocial history related to current functional performance    Occupational performance deficits (Please  refer to evaluation for details): ADL's;IADL's;Leisure    Body Structure / Function / Physical Skills ADL;Dexterity;Flexibility;Strength;Balance;Coordination;FMC;IADL;UE functional use;Mobility    Psychosocial Skills Environmental  Adaptations;Habits;Routines and Behaviors    Rehab Potential Good    Clinical Decision Making Limited treatment options, no  task modification necessary    Comorbidities Affecting Occupational Performance: May have comorbidities impacting occupational performance    Modification or Assistance to Complete Evaluation  No modification of tasks or assist necessary to complete eval    OT Frequency 2x / week    OT Duration 12 weeks    OT Treatment/Interventions Self-care/ADL training;Cryotherapy;Therapeutic exercise;DME and/or AE instruction;Functional Mobility Training;Cognitive remediation/compensation;Balance training;Neuromuscular education;Manual Therapy;Splinting;Visual/perceptual remediation/compensation;Moist Heat;Contrast Bath;Therapeutic activities;Patient/family education    Consulted and Agree with Plan of Care Patient;Family member/caregiver             Patient will benefit from skilled therapeutic intervention in order to improve the following deficits and impairments:   Body Structure / Function / Physical Skills: ADL, Dexterity, Flexibility, Strength, Balance, Coordination, FMC, IADL, UE functional use, Mobility   Psychosocial Skills: Environmental  Adaptations, Habits, Routines and Behaviors   Visit Diagnosis: Muscle weakness (generalized)  Other lack of coordination  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 artery [facility])    Problem List Patient Active Problem List   Diagnosis Date Noted   Hyperlipidemia associated with type 2 diabetes mellitus ([place]) 06/02/2021   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 [place], [place], [address] Phone: [phone]   Fax:  [phone]  Name: Laura [name] MRN: [id number] Date of Birth: [birth year]

## 2021-08-17 NOTE — Therapy (Signed)
[place] [facility] MAIN [facility] SERVICES [address] [place], [place], [address] Phone: [phone]   Fax:  [phone]  Speech Language Pathology Treatment  Patient Details  Name: Laura [name] MRN: [id number] Date of Birth: [birth year] Referring Provider (SLP): Mariam Dollar, PA-C   Encounter Date: 08/16/2021   End of Session - 08/17/21 0852     Visit Number 9    Number of Visits 25    Date for SLP Re-Evaluation 10/04/21    Authorization Type [employer]    SLP Start Time 1600    SLP Stop Time  1700    SLP Time Calculation (min) 60 min    Activity Tolerance Patient tolerated treatment well             Past Medical History:  Diagnosis Date   Acute CVA (cerebrovascular accident) ([facility]) 05/24/2021   Diabetes mellitus without complication ([facility]) 05/23/2021   Hyperlipidemia 05/23/2021   Hypertension 05/23/2021   Left forearm fracture    in high school--was casted   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   Foot fracture, no surgery   TONSILLECTOMY      There were no vitals filed for this visit.   Subjective Assessment - 08/17/21 0824     Subjective Gets out homework    Patient is accompained by: Family member   brother-in-law   Currently in Pain? No/denies                   ADULT SLP TREATMENT - 08/17/21 0838       General Information   Behavior/Cognition Alert;Cooperative    HPI Laura [name] is a 70 year old female with past medical hx noted for DM II, HLD,   admitted at [facility] on 05/23/21 with garbled speech, right facial droop, vision changes and  RUE weakness. MRI brain showed acute infarct in left caudate head and anterior lentiform nucleus, chronic microvascular ischemia. Resulting transcortical motor aphasia, dysarthria and cognitive deficits. Blood sugar was 372 on admission. Admitted to CIR 6/22-  06/02/21.      Cognitive-Linquistic Treatment   Treatment focused on Aphasia;Cognition    Skilled Treatment Much of home exercise not legible due to pt's handwriting. Encouraged her to write on a sheet of wide-ruled paper with larger print. Targeted cognitive flexibility, aphasia compensations with descriptive task, as well as category naming using Taboo cards as stimuli. Written keywords, question cues most effective to generate descriptions/alternatives. Pt often required redirection when perseverating on a written word or verbal error. As task progressed and with therapist demonstration, patient responses were more varied and descriptive.      Assessment / Recommendations / Plan   Plan Continue with current plan of care      Progression Toward Goals   Progression toward goals Progressing toward goals              SLP Education - 08/17/21 0852     Education Details semantic feature analysis    Person(s) Educated Patient    Methods Explanation;Demonstration;Verbal cues    Comprehension Verbalized understanding;Returned demonstration;Verbal cues required;Need further instruction              SLP Short Term Goals - 07/07/21 1755       SLP SHORT TERM GOAL #1   Title Patient will name average of 8+ items for simple /concrete categories.  Time 10    Period --   sessions   Status New    Target Date 08/18/21      SLP SHORT TERM GOAL #2   Title Patient will generate fluent, grammatical, and cogent sentence to complete concrete linguistic tasks with 80% accuracy.    Time 10    Period --   sessions   Status New    Target Date 08/18/21      SLP SHORT TERM GOAL #3   Title The patient will decode and read aloud sentences of varying length and complexity with 80% accuracy.    Time 10    Period --   sessions   Status New    Target Date 08/18/21      SLP SHORT TERM GOAL #4   Title Patient will complete standardized assessment of cognitive communication skills.    Time 2     Period Weeks    Status New    Target Date 07/21/21              SLP Long Term Goals - 07/07/21 1806       SLP LONG TERM GOAL #1   Title Patient will use compensations for aphasia and dysarthria in 10 minutes moderately complex conversation to express thoughts and opinions with listener comprehension at least 90%.    Time 12    Period Weeks    Status New    Target Date 10/05/21      SLP LONG TERM GOAL #2   Title Patient will write or verbally sequence steps to complete simple to mod complex personally relevant tasks (ADLs, IADLs).    Time 12    Period Weeks    Status New    Target Date 10/05/21      SLP LONG TERM GOAL #3   Title Pt will read paragraph-level materials relating to personal interests, financial and medical matters demonstrating comprehension of details >90% accuracy.    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 Cognitive testing completed showing deficits in attention, memory. With modeling able to generate more flexible/varied descriptions of targets using semantic feature analysis today. I recommend skilled ST to address aphasia, dysarthria, and cognitive-communication deficits in order to improve ability to communicate wants and needs and increase pt independence, as pt goal is to return to living independently.    Speech Therapy Frequency 2x / week    Duration 12 weeks    Treatment/Interventions Language facilitation;Environmental controls;Cueing hierarchy;SLP instruction and feedback;Compensatory techniques;Cognitive reorganization;Functional tasks;Compensatory strategies;Internal/external aids;Multimodal communcation approach;Patient/family education    Potential to Achieve Goals Good    SLP Home Exercise Plan TBD    Consulted and Agree with Plan of Care  Patient;Family member/caregiver             Patient will benefit from skilled therapeutic intervention in order to improve the following deficits and impairments:   Aphasia  Cognitive communication deficit    Problem List 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 [place], [place], [address] Phone: [phone]   Fax:  [phone]   Name: Laura [name] MRN: [id number] Date of Birth: November 23, 1951

## 2021-08-18 ENCOUNTER — Encounter: Payer: Self-pay | Admitting: Internal Medicine

## 2021-08-19 ENCOUNTER — Ambulatory Visit: Payer: Medicare Other | Admitting: Speech Pathology

## 2021-08-19 ENCOUNTER — Other Ambulatory Visit: Payer: Self-pay

## 2021-08-19 ENCOUNTER — Ambulatory Visit: Payer: Medicare Other | Admitting: Occupational Therapy

## 2021-08-19 ENCOUNTER — Ambulatory Visit: Payer: Medicare Other

## 2021-08-19 DIAGNOSIS — M6281 Muscle weakness (generalized): Secondary | ICD-10-CM

## 2021-08-19 DIAGNOSIS — R262 Difficulty in walking, not elsewhere classified: Secondary | ICD-10-CM

## 2021-08-19 DIAGNOSIS — R269 Unspecified abnormalities of gait and mobility: Secondary | ICD-10-CM

## 2021-08-19 DIAGNOSIS — R278 Other lack of coordination: Secondary | ICD-10-CM

## 2021-08-19 DIAGNOSIS — R4701 Aphasia: Secondary | ICD-10-CM

## 2021-08-19 DIAGNOSIS — I6300 Cerebral infarction due to thrombosis of unspecified precerebral artery: Secondary | ICD-10-CM

## 2021-08-19 DIAGNOSIS — R41841 Cognitive communication deficit: Secondary | ICD-10-CM

## 2021-08-19 DIAGNOSIS — R471 Dysarthria and anarthria: Secondary | ICD-10-CM

## 2021-08-19 NOTE — Therapy (Signed)
[facility] MAIN [facility] SERVICES [address] [place], [place], [address] Phone: [phone]   Fax:  [phone]  Physical Therapy Treatment  Patient Details  Name: Laura [name] MRN: [id number] Date of Birth: Feb 01, 1951 Referring Provider (PT): Carlis Abbott Drema Pry, MD   Encounter Date: 08/19/2021   PT End of Session - 08/19/21 1521     Visit Number 11    Number of Visits 25    Date for PT Re-Evaluation 09/27/21    Authorization Type Medicare (traditional)    Authorization Time Period 07/05/21-09/27/21    Progress Note Due on Visit 20    PT Start Time 1515    PT Stop Time 1559    PT Time Calculation (min) 44 min    Equipment Utilized During Treatment Gait belt    Activity Tolerance Patient tolerated treatment well    Behavior During Therapy [facility] for tasks assessed/performed             Past Medical History:  Diagnosis Date   Acute CVA (cerebrovascular accident) (HCC) 05/24/2021   Diabetes mellitus without complication (HCC) 05/23/2021   Hyperlipidemia 05/23/2021   Hypertension 05/23/2021   Left forearm fracture    in high school--was casted   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   Foot fracture, no surgery   TONSILLECTOMY      There were no vitals filed for this visit.   Subjective Assessment - 08/19/21 1519     Subjective Patient reports she was seen by Eye MD and has cataracts will need procedure in near future.    Patient is accompained by: Family member   sister   Pertinent History Pt presents to evaluation with her sister as pt has difficulty with speech. Pt reports she has not been using an AD. Pt confirms she was admitted to hospital d/t CVA on 05/23/2021. Pt sister reports they are unsure of the onset of the stroke. Pt's sister reports she checked on her sister after she received a birthday card and  noticed her sister's signature looked abnormal. She called the pt and reports her voice was slurred. When she checked on her she said she had R side facial droop, difficulty with R hand. Pt's sister reports pt initially declined going to the ED but agreed to go the following morning where she was diagnosed with a CVA. Once d/c from the hospital the pt went to rehab for a week. Pt was living alone, independently prior to her stroke, but now lives with her sister and her sister's husband. Pt's sister says her husband walks with pt 15 mins 2x/day. Pt's sister reports after pt has been walking for a while she will start to drag her L leg. She says this has improved with time. Pt now requires some assistance with ADLs, including dressing, making her bed, and self-feeding, showering. Pt's sister reports pt now has vision issues as well as balance problems. She reports pt loses balance with dressing herself. Other PMH includes: HTN, DMII, Vitamin b12 deficiency, HLD, Vitamin D deficiency. Pt also with dysphasia, aphasia, weakness d/t recent stroke.    Limitations Walking;Writing;Standing;Lifting;House hold activities;Reading    How long can you sit comfortably? not affected    How long can you stand comfortably? 15 min    How long can you walk comfortably? 15 minutes    Diagnostic tests  per chart MRI brain revealed acute nonhemorrhagic infarct in L caudate had and anterior lentiform nucelus and chronic microvascular   ischemia mildly advanced for age. CTA head neck negative for AVM, aneurysms or stenosis. 2D echo showing EF 65 to 70% with borderline LVH.    Patient Stated Goals Pt would like to go home again    Currently in Pain? No/denies                INTERVENTIONS:   Neuromuscular Re-ed:   Static stand on wood ramps- incline then decline x 30 sec x 2- Mild posterior lean with decline stand yet no LOB.   Static stand on burgundy wedge - Incline and decline x 60 sec x 3 trials each- Increased  unsteadiness yet able to self correct using ankle and some hip strategy.   Static standing on BOSU Ball (flat side up) x 60 sec x 3. - Initial difficulty maintaining balance yet improved with time.  A/P rocking on BOSU ball (flat side up) - mild unsteadiness with posterior rock- yet able to self correct.   Static standing on BOSU ball (curved side up) increased difficulty  Static tandem stand on 1/2 foam x 60 sec each LE in front x 2 sets.   Education provided throughout session via VC/TC and demonstration to facilitate movement at target joints and correct muscle activation for all testing and exercises performed.   Clinical Impression: Patient was motivated for today's session and able to perform higher level balance exercises well today without significant difficulty. She was able to use mostly ankle righting reactions and some hip but able to maintain her balance well with wedge/incline/decline surfaces today. Patient would benefit from additional skilled PT intervention to improve balance and mobility  Note: Portions of this document were prepared using Dragon voice recognition software and although reviewed may contain unintentional dictation errors in syntax, grammar, or spelling.                       PT Education - 08/19/21 1520     Education Details Exercise/balance technique    Person(s) Educated Patient    Methods Explanation;Demonstration;Tactile cues;Verbal cues    Comprehension Verbalized understanding;Returned demonstration;Verbal cues required;Tactile cues required;Need further instruction              PT Short Term Goals - 08/12/21 1402       PT SHORT TERM GOAL #1   Title Patient will be independent in home exercise program to improve strength/mobility for better functional independence with ADLs.    Baseline 07/05/2021: to be initiated next visit; 9/8: has been holding off due to medication side effect;    Time 6    Status On-going    Target  Date 08/16/21               PT Long Term Goals - 08/12/21 1358       PT LONG TERM GOAL #1   Title Patient will increase FOTO score to equal to or greater than 79 to demonstrate statistically significant improvement in mobility and quality of life.    Baseline 8/1: 70    Time 12    Period Weeks    Status On-going    Target Date 09/27/21      PT LONG TERM GOAL #2   Title Patient will increase Berg Balance score by > 6 points to demonstrate decreased fall risk during functional activities.    Baseline 8/1: 46/56    Time  12    Period Weeks    Status On-going    Target Date 09/27/21      PT LONG TERM GOAL #3   Title The pt will ambulate at least 1700 ft during to demonstrate return toward age norm gait ability.    Baseline 8/1: 1398 ft no AD, CGA; 9/8: 1460 feet;    Time 12    Period Weeks    Status On-going    Target Date 09/27/21      PT LONG TERM GOAL #4   Title Pt to demonstrate improvement in Mini-BesTest score to 22/28 or higher to represent improved balance and reduced falls risk.    Baseline On 07/22/21: 15/28, 9/8: 18/28    Time 8    Period Weeks    Status New                   Plan - 08/19/21 0831     Clinical Impression Statement Patient was motivated for today's session and able to perform higher level balance exercises well today without significant difficulty. She was able to use mostly ankle righting reactions and some hip but able to maintain her balance well with wedge/incline/decline surfaces today. Patient would benefit from additional skilled PT intervention to improve balance and mobility    Personal Factors and Comorbidities Comorbidity 1;Comorbidity 2;Comorbidity 3+;Sex;Age    Comorbidities HTN, DMII, dysphasia, aphasia, weakness,    Examination-Activity Limitations Locomotion Level;Stairs;Dressing;Bathing;Reach Overhead;Lift;Caring for Others    Examination-Participation Restrictions Driving;Medication Management;Personal  Finances;Cleaning;Meal Prep;Yard Work;Community Activity;Shop;Other;Laundry    Stability/Clinical Decision Making Evolving/Moderate complexity    Rehab Potential Good    PT Frequency 2x / week    PT Duration 12 weeks    PT Treatment/Interventions ADLs/Self Care Home Management;Biofeedback;Canalith Repostioning;Aquatic Therapy;Cryotherapy;Moist Heat;DME Instruction;Gait training;Stair training;Functional mobility training;Therapeutic activities;Therapeutic exercise;Balance training;Neuromuscular re-education;Cognitive remediation;Patient/family education;Orthotic Fit/Training;Manual techniques;Passive range of motion;Energy conservation;Taping;Splinting;Vestibular;Visual/perceptual remediation/compensation;Joint Manipulations    PT Next Visit Plan strength, endurance, balance and gait interventions    PT Home Exercise Plan No changes to HEP this visit    Consulted and Agree with Plan of Care Patient;Family member/caregiver    Family Member Consulted sister             Patient will benefit from skilled therapeutic intervention in order to improve the following deficits and impairments:  Abnormal gait, Decreased activity tolerance, Decreased endurance, Decreased range of motion, Decreased strength, Hypomobility, Improper body mechanics, Decreased balance, Decreased coordination, Decreased mobility, Difficulty walking, Impaired flexibility, Postural dysfunction, Impaired vision/preception, Impaired sensation  Visit Diagnosis: Abnormality of gait and mobility  Difficulty in walking, not elsewhere classified  Muscle weakness (generalized)  Other lack of coordination     Problem List 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 [place], [place], [address] Phone: [phone]   Fax:  [phone]  Name: Laura [name] MRN: [id number] Date of Birth: May 03, 1951

## 2021-08-19 NOTE — Therapy (Signed)
[place] MAIN [facility] SERVICES [address] [place], [place], [address] Phone: [phone]   Fax:  [phone]  Speech Language Pathology Treatment and Progress Update  Patient Details  Name: Laura [name] MRN: [id number] Date of Birth: Oct 18, 1951 Referring Provider (SLP): Laura Rinne, PA-C  Speech Therapy Progress Note  Dates of Reporting Period: 07/06/2021 to 08/19/2021  Objective: Patient has been seen for 10 speech therapy sessions this reporting period targeting aphasia, cognitive communication, and dysarthria. Patient is making progress toward LTGs and met 3/4 STGs this reporting period. See skilled intervention, clinical impressions, and goals below for details.  Encounter Date: 08/19/2021   End of Session - 08/19/21 1701     Visit Number 10    Number of Visits 25    Date for SLP Re-Evaluation 10/04/21    Authorization Type [employer]    SLP Start Time 25    SLP Stop Time  1700    SLP Time Calculation (min) 60 min    Activity Tolerance Patient tolerated treatment well             Past Medical History:  Diagnosis Date   Acute CVA (cerebrovascular accident) ([place]) 05/24/2021   Diabetes mellitus without complication ([place]) 75/91/6384   Hyperlipidemia 05/23/2021   Hypertension 05/23/2021   Left forearm fracture    in high school--was casted   Stroke (cerebrum) ([place]) 05/26/2021   Stroke ([place]) 05/23/2021    Past Surgical History:  Procedure Laterality Date   FOOT SURGERY     FRACTURE SURGERY  ?   Foot fracture, no surgery   TONSILLECTOMY      There were no vitals filed for this visit.   Subjective Assessment - 08/19/21 1602     Subjective "Oh, cataracts."    Currently in Pain? No/denies                   ADULT SLP TREATMENT - 08/19/21 1603       General Information   Behavior/Cognition Alert;Cooperative    HPI Laura [name] is a 70 year old female with past medical hx noted for DM II, HLD,   admitted  at [facility] on 05/23/21 with garbled speech, right facial droop, vision changes and  RUE weakness. MRI brain showed acute infarct in left caudate head and anterior lentiform nucleus, chronic microvascular ischemia. Resulting transcortical motor aphasia, dysarthria and cognitive deficits. Blood sugar was 372 on admission. Admitted to CIR 6/22- 06/02/21.      Cognitive-Linquistic Treatment   Treatment focused on Aphasia;Cognition    Skilled Treatment Reviewed STG and reassessed progress toward goals. Generated sentences for concrete actions 100% accuracy. Read/decode sentences: ID missing word 100% accuracy. Follow 2 step written directions 100% accuracy. Generative naming for simple, concrete categories averaged 8.5 per category with occasional questioning cues.      Assessment / Recommendations / Plan   Plan Continue with current plan of care      Progression Toward Goals   Progression toward goals Progressing toward goals              SLP Education - 08/19/21 1701     Education Details progress toward goals    Person(s) Educated Patient    Methods Explanation    Comprehension Verbalized understanding              SLP Short Term Goals - 08/19/21 1703       SLP SHORT TERM GOAL #1   Title Patient will name average of 8+  items for simple /concrete categories.    Time 10    Period --   sessions   Status Partially Met    Target Date 08/18/21      SLP SHORT TERM GOAL #2   Title Patient will generate fluent, grammatical, and cogent sentence to complete concrete linguistic tasks with 80% accuracy.    Time 10    Period --   sessions   Status Achieved    Target Date 08/18/21      SLP SHORT TERM GOAL #3   Title The patient will decode and read aloud sentences of varying length and complexity with 80% accuracy.    Time 10    Period --   sessions   Status Achieved    Target Date 08/18/21      SLP SHORT TERM GOAL #4   Title Patient will complete standardized assessment of  cognitive communication skills.    Time 2    Period Weeks    Status Achieved    Target Date 07/21/21      SLP SHORT TERM GOAL #5   Title Patient will write logical/appropriate sentences of 8-10 words 80% accuracy with min cues for double checking.    Time 10    Period --   sessions   Status New      Additional Short Term Goals   Additional Short Term Goals Yes      SLP SHORT TERM GOAL #6   Title Patient will use compensations for aphasia/dysarthria in 5 min conversation with occasional min cues.    Time 10    Period --   sessions   Status New      SLP SHORT TERM GOAL #7   Title Patient will identify when she is perseverating and choose an alternative (break, use a strategy), 75% of the time with min verbal cue.    Time 10    Period --   sessions   Status New              SLP Long Term Goals - 08/19/21 1706       SLP LONG TERM GOAL #1   Title Patient will use compensations for aphasia and dysarthria in 10 minutes moderately complex conversation to express thoughts and opinions with listener comprehension at least 90%.    Time 12    Period Weeks    Status On-going      SLP LONG TERM GOAL #2   Title Patient will write or verbally sequence steps to complete simple to mod complex personally relevant tasks (ADLs, IADLs).    Time 12    Period Weeks    Status On-going      SLP LONG TERM GOAL #3   Title Pt will read paragraph-level materials relating to personal interests, financial and medical matters demonstrating comprehension of details >90% accuracy.    Time 12    Period Weeks    Status On-going              Plan - 08/19/21 1702     Clinical Impression Statement Laura [name] presents with mild anomic aphasia, mild dysarthria, and mild-moderate cognitive deficits s/p CVA. Patient met 3/4 STG and partially met 1/4. Perseveration/ difficulty with cognitive flexibility when anomia occurs. Goals updated. I recommend skilled ST to address aphasia, dysarthria, and  cognitive-communication deficits in order to improve ability to communicate wants and needs and increase pt independence, as pt goal is to return to living independently.    Speech Therapy  Frequency 2x / week    Duration 12 weeks    Treatment/Interventions Language facilitation;Environmental controls;Cueing hierarchy;SLP instruction and feedback;Compensatory techniques;Cognitive reorganization;Functional tasks;Compensatory strategies;Internal/external aids;Multimodal communcation approach;Patient/family education    Potential to Achieve Goals Good    SLP Home Exercise Plan TBD    Consulted and Agree with Plan of Care Patient;Family member/caregiver             Patient will benefit from skilled therapeutic intervention in order to improve the following deficits and impairments:   Aphasia  Cognitive communication deficit  Dysarthria and anarthria  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 artery [facility])    Problem List Patient Active Problem List   Diagnosis Date Noted   Hyperlipidemia associated with type 2 diabetes mellitus ([place]) 06/02/2021   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 [place], [place], [address] Phone: [phone]   Fax:  [phone]   Name: Laura [name] MRN: [id number] Date of Birth: [birth year]

## 2021-08-20 ENCOUNTER — Encounter: Payer: Self-pay | Admitting: Occupational Therapy

## 2021-08-20 NOTE — Therapy (Signed)
[place] MAIN [facility] SERVICES [address] [place], [place], [address] Phone: [phone]   Fax:  [phone]  Occupational Therapy Treatment  Patient Details  Name: Laura [name] MRN: [id number] Date of Birth: January 28, 1951 No data recorded  Encounter Date: 08/19/2021   OT End of Session - 08/20/21 1834     Visit Number 14    Number of Visits 24    Date for OT Re-Evaluation 10/04/21    Authorization Type Progress report starting 08/04/2021    OT Start Time 1650    OT Stop Time 1730    OT Time Calculation (min) 40 min    Activity Tolerance Patient tolerated treatment well    Behavior During Therapy [facility] for tasks assessed/performed             Past Medical History:  Diagnosis Date   Acute CVA (cerebrovascular accident) ([place]) 05/24/2021   Diabetes mellitus without complication ([place]) 56/38/7564   Hyperlipidemia 05/23/2021   Hypertension 05/23/2021   Left forearm fracture    in high school--was casted   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   Foot fracture, no surgery   TONSILLECTOMY      There were no vitals filed for this visit.  OT TREATMENT     Neuro muscular re-education:   Pt. worked on right hand [facility] skills while following  a design pattern. Pt. worked on using her right hand for grasping small, light Mini Lite-Brite pegs, preforming translatory movements moving them through her palm to the tip of her 2nd digit, and thumb in preparation for pressing them into the Lite-Brite board. Pt. Worked on removing the pegs while using her right hand to store the pegs. Pt. Worked on dropping the pegs from her palm one at a time.    Therapeutic Exercise:   Pt. worked on the [employer] for 8 min. with constant monitoring of the BUEs. Pt. worked on changing, and alternating forward reverse for 2 min. rest breaks were  required.  Pt. worked on level 4.5.   Pt. had an eye appointment this week. Pt. and her sister report that the pt. Has cataracts, Is preparing to have cataract surgery pending medical clearance. Pt. required visual cues to follow the design pattern secondary to low vision. Pt. Education was provided about high contrast visual compensatory strategies. Pt. is improving with right hand function, and [facility] skills. Pt. Is progressing towards working on manipulating smaller objects. Pt. Dropped multiple lighter objects during translatory movements with the right hand. Pt. continues to work on improving right hand function skills in order to be able to manipulate small objects during ADL, and IADL tasks.                                 OT Education - 08/20/21 1834     Education Details [facility]    Person(s) Educated Patient;Caregiver(s)    Comprehension Verbalized understanding;Need further instruction;Verbal cues required                 OT Long Term Goals - 08/04/21 1321       OT LONG TERM GOAL #1   Title Pt will be independent with home exercise program.    Baseline 08/04/2021: Independent with Theraputty HEP.    Time 12  Period Weeks    Status Partially Met    Target Date 10/04/21      OT LONG TERM GOAL #2   Title Pt will improve FOTO score to 73 or above to demonstrate a clinically relevant change to impact greater independence in necessary daily tasks.    Baseline 08/04/2021: 65 Eval score of 61    Time 12    Period Weeks    Status New    Target Date 10/04/21      OT LONG TERM GOAL #3   Title Pt will complete light meal prep with modified independence.    Baseline 08/04/2021: Pt. is now preparing lunch sandwiches  independently. pt. is able to use the microwave. Pt. requires assist from family    Time 12    Period Weeks    Status Partially Met    Target Date 10/04/21      OT LONG TERM GOAL #4   Title Pt will complete laundry tasks with modified  independence    Baseline 08/04/2021: Pt. is able to wash, transfer, and fold laundry. Pt. has improved, however requires occ. assist with the laundry controls. Eval: requires assist from family    Time 12    Period Weeks    Status New    Target Date 10/04/21      OT LONG TERM GOAL #5   Title Pt will complete check writing with modified independence.    Baseline 08/04/2021: Pt. was able to independently complete, and has her sister review it. Eval:  difficulty    Time 6    Period Weeks    Status Achieved      OT LONG TERM GOAL #6   Title Pt will demonstrate handwriting with legibility 80% or greater to write demographics and signature  on important paper.    Baseline 08/04/2021: Writing 70-80% legibility. EvaL difficulty with poor legibility    Time 6    Period Weeks    Status On-going    Target Date 08/23/21      OT LONG TERM GOAL #7   Title Pt will complete basic self care tasks with modified independence.    Baseline increased time and occasional assist    Time 6    Period Weeks    Status Achieved      OT LONG TERM GOAL #8   Title Pt to improve coordination by reduction of time on 9 hole peg test by 10 secs to assist with performing coordination tasks with writing, buttons, zippers.    Baseline 08/04/2021: 46 sec. Eval: right 52 secs    Time 12    Period Weeks    Status On-going    Target Date 10/04/21      OT LONG TERM GOAL  #9   TITLE Pt will improve right grip strength by 10# to open jars, containers with modified independence.    Baseline 08/04/2021: 39# Eval: right 28#    Time 12    Period Weeks    Status On-going    Target Date 10/04/21                   Plan - 08/20/21 1835     Clinical Impression Statement Pt. had an eye appointment this week. Pt. and her sister report that the pt. Has cataracts, Is preparing to have cataract surgery pending medical clearance. Pt. required visual cues to follow the design pattern secondary to low vision. Pt. Education  was provided about high contrast visual compensatory strategies.  Pt. is improving with right hand function, and [facility] skills. Pt. Is progressing towards working on manipulating smaller objects. Pt. Dropped multiple lighter objects during translatory movements with the right hand. Pt. continues to work on improving right hand function skills in order to be able to manipulate small objects during ADL, and IADL tasks   OT Occupational Profile and History Detailed Assessment- Review of Records and additional review of physical, cognitive, psychosocial history related to current functional performance    Occupational performance deficits (Please refer to evaluation for details): ADL's;IADL's;Leisure    Body Structure / Function / Physical Skills ADL;Dexterity;Flexibility;Strength;Balance;Coordination;FMC;IADL;UE functional use;Mobility    Psychosocial Skills Environmental  Adaptations;Habits;Routines and Behaviors    Rehab Potential Good    Clinical Decision Making Limited treatment options, no task modification necessary    Comorbidities Affecting Occupational Performance: May have comorbidities impacting occupational performance    Modification or Assistance to Complete Evaluation  No modification of tasks or assist necessary to complete eval    OT Frequency 2x / week    OT Duration 12 weeks    OT Treatment/Interventions Self-care/ADL training;Cryotherapy;Therapeutic exercise;DME and/or AE instruction;Functional Mobility Training;Cognitive remediation/compensation;Balance training;Neuromuscular education;Manual Therapy;Splinting;Visual/perceptual remediation/compensation;Moist Heat;Contrast Bath;Therapeutic activities;Patient/family education    Consulted and Agree with Plan of Care Patient;Family member/caregiver    Family Member Consulted Sister             Patient will benefit from skilled therapeutic intervention in order to improve the following deficits and impairments:   Body Structure /  Function / Physical Skills: ADL, Dexterity, Flexibility, Strength, Balance, Coordination, FMC, IADL, UE functional use, Mobility   Psychosocial Skills: Environmental  Adaptations, Habits, Routines and Behaviors   Visit Diagnosis: Muscle weakness (generalized)  Other lack of coordination    Problem List Patient Active Problem List   Diagnosis Date Noted   Hyperlipidemia associated with type 2 diabetes mellitus ([place]) 06/02/2021   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 [place], [place], [address] Phone: [phone]   Fax:  [phone]  Name: Timeka Goette MRN: [id number] Date of Birth: Apr 25, 1951

## 2021-08-24 ENCOUNTER — Ambulatory Visit: Payer: Medicare Other | Admitting: Physical Therapy

## 2021-08-24 ENCOUNTER — Ambulatory Visit: Payer: Medicare Other

## 2021-08-24 ENCOUNTER — Ambulatory Visit: Payer: Medicare Other | Admitting: Speech Pathology

## 2021-08-24 ENCOUNTER — Encounter: Payer: Self-pay | Admitting: Physical Therapy

## 2021-08-24 ENCOUNTER — Other Ambulatory Visit: Payer: Self-pay

## 2021-08-24 DIAGNOSIS — R2681 Unsteadiness on feet: Secondary | ICD-10-CM

## 2021-08-24 DIAGNOSIS — R278 Other lack of coordination: Secondary | ICD-10-CM

## 2021-08-24 DIAGNOSIS — M6281 Muscle weakness (generalized): Secondary | ICD-10-CM

## 2021-08-24 DIAGNOSIS — R269 Unspecified abnormalities of gait and mobility: Secondary | ICD-10-CM

## 2021-08-24 DIAGNOSIS — R2689 Other abnormalities of gait and mobility: Secondary | ICD-10-CM

## 2021-08-24 DIAGNOSIS — R41841 Cognitive communication deficit: Secondary | ICD-10-CM

## 2021-08-24 DIAGNOSIS — I6300 Cerebral infarction due to thrombosis of unspecified precerebral artery: Secondary | ICD-10-CM

## 2021-08-24 DIAGNOSIS — R262 Difficulty in walking, not elsewhere classified: Secondary | ICD-10-CM

## 2021-08-24 DIAGNOSIS — R471 Dysarthria and anarthria: Secondary | ICD-10-CM

## 2021-08-24 DIAGNOSIS — R4701 Aphasia: Secondary | ICD-10-CM

## 2021-08-24 NOTE — Therapy (Signed)
[place] [facility] MAIN [facility] SERVICES [address] [place], [place], [address] Phone: [phone]   Fax:  [phone]  Physical Therapy Treatment  Patient Details  Name: Laura [name] MRN: [id number] Date of Birth: 1951/06/15 Referring Provider (PT): Carlis Abbott Drema Pry, MD   Encounter Date: 08/24/2021   PT End of Session - 08/24/21 1104     Visit Number 12    Number of Visits 25    Date for PT Re-Evaluation 09/27/21    Authorization Type Medicare (traditional)    Authorization Time Period 07/05/21-09/27/21    Progress Note Due on Visit 20    PT Start Time 1105    PT Stop Time 1148    PT Time Calculation (min) 43 min    Equipment Utilized During Treatment Gait belt    Activity Tolerance Patient tolerated treatment well    Behavior During Therapy [facility] for tasks assessed/performed             Past Medical History:  Diagnosis Date   Acute CVA (cerebrovascular accident) ([facility]) 05/24/2021   Diabetes mellitus without complication ([facility]) 05/23/2021   Hyperlipidemia 05/23/2021   Hypertension 05/23/2021   Left forearm fracture    in high school--was casted   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   Foot fracture, no surgery   TONSILLECTOMY      There were no vitals filed for this visit.   Subjective Assessment - 08/24/21 1106     Subjective Patient reports doing well; no pain; denies any new falls but does report being unsteady at times;    Patient is accompained by: Family member   sister   Pertinent History Pt presents to evaluation with her sister as pt has difficulty with speech. Pt reports she has not been using an AD. Pt confirms she was admitted to hospital d/t CVA on 05/23/2021. Pt sister reports they are unsure of the onset of the stroke. Pt's sister reports she checked on her sister after she received a birthday card and  noticed her sister's signature looked abnormal. She called the pt and reports her voice was slurred. When she checked on her she said she had R side facial droop, difficulty with R hand. Pt's sister reports pt initially declined going to the ED but agreed to go the following morning where she was diagnosed with a CVA. Once d/c from the hospital the pt went to rehab for a week. Pt was living alone, independently prior to her stroke, but now lives with her sister and her sister's husband. Pt's sister says her husband walks with pt 15 mins 2x/day. Pt's sister reports after pt has been walking for a while she will start to drag her L leg. She says this has improved with time. Pt now requires some assistance with ADLs, including dressing, making her bed, and self-feeding, showering. Pt's sister reports pt now has vision issues as well as balance problems. She reports pt loses balance with dressing herself. Other PMH includes: HTN, DMII, Vitamin b12 deficiency, HLD, Vitamin D deficiency. Pt also with dysphasia, aphasia, weakness d/t recent stroke.    Limitations Walking;Writing;Standing;Lifting;House hold activities;Reading    How long can you sit comfortably? not affected    How long can you stand comfortably? 15 min    How long can you walk comfortably? 15 minutes    Diagnostic tests  per chart MRI brain revealed acute nonhemorrhagic infarct in L caudate had and anterior lentiform nucelus and chronic microvascular   ischemia mildly advanced for age. CTA head neck negative for AVM, aneurysms or stenosis. 2D echo showing EF 65 to 70% with borderline LVH.    Patient Stated Goals Pt would like to go home again    Currently in Pain? No/denies    Multiple Pain Sites No                INTERVENTIONS:    Neuromuscular Re-ed:    Static stand on wood incline with airex pad: Eyes open  x 30 sec x 2, eyes closed 15 sec hold x2 reps with increased instability noted with eyes closed  Progressed to eyes open head  turns side/side x5 reps, up/down x5 reps;   standing on incline with airex pad:  Forward/backward weight shift with feet in staggered stance x20 reps with 1-0 rail assist, min A for safety with cues for weight shift for better stance control;  Side/side weight shift with feet apart with 2-1 - 0 rail assist x20 reps with min A for safety;  Feet apart balancing board in midline:   Heel raises with 1 rail assist, min A for safety with good neutral weight shift   Mini squat with 1 rail assist, min A , moderate instability noted with increased unsteadiness reported;   Forward step up/down on  BOSU (round side up) x10 reps with 1 rail assist; CGA for safety;  Static standing on BOSU ball (curved side up)   -feet shoulder width apart: passing cone side/side reaching with each UE x5 reps each with min A for safety  -BUE arm raise looking up at ceiling and then down at floor x10 reps requiring min A for safety  Patient reports moderate difficulty with increased instability noted  Side stepping over cones #4 x1 lap each direction withcues to increase step length for better foot clearance Progressed to dual task with stepping over cone while holding kick ball #4 x1 lap and then progressed to stepping over cone while holding kick ball and pushing ball out away from body with feet apart and then bringing ball close to body when stepping together x1 lap each with moderate difficulty reported requiring mod VCs for sequencing and CGA for safety;    Education provided throughout session via VC/TC and demonstration to facilitate movement at target joints and correct muscle activation for all testing and exercises performed.    Patient did require cues for neutral weight shift for better stance control especially while on compliant surfaces. She reports mild fatigue at end of session.                           PT Education - 08/24/21 1103     Education Details balance/coordination;     Person(s) Educated Patient    Methods Explanation;Verbal cues    Comprehension Verbalized understanding;Returned demonstration;Verbal cues required;Need further instruction              PT Short Term Goals - 08/12/21 1402       PT SHORT TERM GOAL #1   Title Patient will be independent in home exercise program to improve strength/mobility for better functional independence with ADLs.    Baseline 07/05/2021: to be initiated next visit; 9/8: has been holding off due to medication side effect;    Time 6    Status On-going    Target  Date 08/16/21               PT Long Term Goals - 08/12/21 1358       PT LONG TERM GOAL #1   Title Patient will increase FOTO score to equal to or greater than 79 to demonstrate statistically significant improvement in mobility and quality of life.    Baseline 8/1: 70    Time 12    Period Weeks    Status On-going    Target Date 09/27/21      PT LONG TERM GOAL #2   Title Patient will increase Berg Balance score by > 6 points to demonstrate decreased fall risk during functional activities.    Baseline 8/1: 46/56    Time 12    Period Weeks    Status On-going    Target Date 09/27/21      PT LONG TERM GOAL #3   Title The pt will ambulate at least 1700 ft during to demonstrate return toward age norm gait ability.    Baseline 8/1: 1398 ft no AD, CGA; 9/8: 1460 feet;    Time 12    Period Weeks    Status On-going    Target Date 09/27/21      PT LONG TERM GOAL #4   Title Pt to demonstrate improvement in Mini-BesTest score to 22/28 or higher to represent improved balance and reduced falls risk.    Baseline On 07/22/21: 15/28, 9/8: 18/28    Time 8    Period Weeks    Status New                   Plan - 08/24/21 1410     Clinical Impression Statement Patient motivated and participated well within session. Utilized compliant surface to challenge stance control. Patient continues to have difficulty standing on incline with eyes  closed. She also had difficulty with dynamic tasks on compliant surface with reduced rail assist. Patient required CGA throughout session with intermittent min A with advanced tasks. She did have difficulty with dual task requiring mod VCs for sequencing and positioning. Patient would benefit from additional skilled PT Intervention to improve strength, balance and mobility;    Personal Factors and Comorbidities Comorbidity 1;Comorbidity 2;Comorbidity 3+;Sex;Age    Comorbidities HTN, DMII, dysphasia, aphasia, weakness,    Examination-Activity Limitations Locomotion Level;Stairs;Dressing;Bathing;Reach Overhead;Lift;Caring for Others    Examination-Participation Restrictions Driving;Medication Management;Personal Finances;Cleaning;Meal Prep;Yard Work;Community Activity;Shop;Other;Laundry    Stability/Clinical Decision Making Evolving/Moderate complexity    Rehab Potential Good    PT Frequency 2x / week    PT Duration 12 weeks    PT Treatment/Interventions ADLs/Self Care Home Management;Biofeedback;Canalith Repostioning;Aquatic Therapy;Cryotherapy;Moist Heat;DME Instruction;Gait training;Stair training;Functional mobility training;Therapeutic activities;Therapeutic exercise;Balance training;Neuromuscular re-education;Cognitive remediation;Patient/family education;Orthotic Fit/Training;Manual techniques;Passive range of motion;Energy conservation;Taping;Splinting;Vestibular;Visual/perceptual remediation/compensation;Joint Manipulations    PT Next Visit Plan strength, endurance, balance and gait interventions    PT Home Exercise Plan No changes to HEP this visit    Consulted and Agree with Plan of Care Patient;Family member/caregiver    Family Member Consulted sister             Patient will benefit from skilled therapeutic intervention in order to improve the following deficits and impairments:  Abnormal gait, Decreased activity tolerance, Decreased endurance, Decreased range of motion, Decreased  strength, Hypomobility, Improper body mechanics, Decreased balance, Decreased coordination, Decreased mobility, Difficulty walking, Impaired flexibility, Postural dysfunction, Impaired vision/preception, Impaired sensation  Visit Diagnosis: Muscle weakness (generalized)  Other lack of coordination  Abnormality of gait and  mobility  Difficulty in walking, not elsewhere classified  Unsteadiness on feet  Other abnormalities of gait and mobility     Problem List 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 [place], [place], [address] Phone: [phone]   Fax:  [phone]  Name: Laura [name] MRN: [id number] Date of Birth: 1951/05/13

## 2021-08-24 NOTE — Therapy (Addendum)
[place] MAIN [facility] SERVICES [address] [place], [place], [address] Phone: [phone]   Fax:  [phone]  Occupational Therapy Treatment  Patient Details  Name: Laura [name] MRN: [id number] Date of Birth: [birth year] No data recorded  Encounter Date: 08/24/2021   OT End of Session - 08/24/21 1357     Visit Number 15    Number of Visits 24    Date for OT Re-Evaluation 10/04/21    Authorization Type Progress report starting 08/04/2021    OT Start Time 1015    OT Stop Time 1100    OT Time Calculation (min) 45 min    Activity Tolerance Patient tolerated treatment well    Behavior During Therapy [facility] for tasks assessed/performed             Past Medical History:  Diagnosis Date   Acute CVA (cerebrovascular accident) ([place]) 05/24/2021   Diabetes mellitus without complication ([place]) 25/85/2778   Hyperlipidemia 05/23/2021   Hypertension 05/23/2021   Left forearm fracture    in high school--was casted   Stroke (cerebrum) ([place]) 05/26/2021   Stroke ([place]) 05/23/2021    Past Surgical History:  Procedure Laterality Date   FOOT SURGERY     FRACTURE SURGERY  ?   Foot fracture, no surgery   TONSILLECTOMY      There were no vitals filed for this visit.   Subjective Assessment - 08/24/21 1356     Subjective  Pt stated that she made a grilled cheese on the stove top with her sister and it went well.    Patient is accompanied by: Family member    Pertinent History Per chart, Laura [name] is a 70 y.o. female in relatively good health but no medical care for years who was admitted on 05/23/2021 to [facility] with garbled speech, right facial droop, vision changes and right upper committee weakness.  Per reports she started feeling weak on 06/14 but refused to come to the hospital.  MRI brain done revealing acute nonhemorrhagic infarct in left caudate head and anterior lentiform nucleus as well as chronic microvascular ischemia mildly advanced for  age.  CTA head/neck was negative for AVM, aneurysm or stenosis.  She was noted to have low vitamin B12 level at 97 and was started on supplementation.  Stroke was felt to be due to small vessel disease and neurology recommended DAPT x3 weeks followed by aspirin alone.  She continued to be limited by weakness with dysarthria, tachycardia with activity, balance deficits as well as motor planning deficits with ADLs.  CIR was recommended due to functional decline from 05-26-2021 to 06-02-2021.    Patient Stated Goals Pt would like to go home and be as independent as possible    Currently in Pain? No/denies    Pain Score 0-No pain    Multiple Pain Sites No           Occupational Therapy Treatment: Therapeutic Activity: Pt participated in table top [place] game, using R hand to target picking up multiple small stones from a dish, storing them in palm, transferring them from palm to fingertips to discard from hand 1 at a time into a line of small dishes.  Extra time needed for motor planning with occasional dropping of 1 or 2 stones in hand.  Able to consistently store and discard 4 stones in hand at a time.    Therapeutic Exercise: R grip strengthening addressed using hand gripper set at 17.9 lbs of force to remove jumbo pegs  from pegboard and place into container.  3 trials completed, facilitating prolonged grasp for 3rd set with pt having to squeeze gripper and peg 3 seconds before dropping peg in container; able to complete with 50% accuracy and rest breaks needed.   Response to Treatment: See Plan/clinical impression below.    OT Education - 08/24/21 1357     Education Details [facility]    Person(s) Educated Patient;Caregiver(s)    Methods Explanation;Verbal cues;Handout    Comprehension Verbalized understanding;Need further instruction;Verbal cues required                 OT Long Term Goals - 08/04/21 1321       OT LONG TERM GOAL #1   Title Pt will be independent with home exercise  program.    Baseline 08/04/2021: Independent with Theraputty HEP.    Time 12    Period Weeks    Status Partially Met    Target Date 10/04/21      OT LONG TERM GOAL #2   Title Pt will improve FOTO score to 73 or above to demonstrate a clinically relevant change to impact greater independence in necessary daily tasks.    Baseline 08/04/2021: 65 Eval score of 61    Time 12    Period Weeks    Status New    Target Date 10/04/21      OT LONG TERM GOAL #3   Title Pt will complete light meal prep with modified independence.    Baseline 08/04/2021: Pt. is now preparing lunch sandwiches  independently. pt. is able to use the microwave. Pt. requires assist from family    Time 12    Period Weeks    Status Partially Met    Target Date 10/04/21      OT LONG TERM GOAL #4   Title Pt will complete laundry tasks with modified independence    Baseline 08/04/2021: Pt. is able to wash, transfer, and fold laundry. Pt. has improved, however requires occ. assist with the laundry controls. Eval: requires assist from family    Time 12    Period Weeks    Status New    Target Date 10/04/21      OT LONG TERM GOAL #5   Title Pt will complete check writing with modified independence.    Baseline 08/04/2021: Pt. was able to independently complete, and has her sister review it. Eval:  difficulty    Time 6    Period Weeks    Status Achieved      OT LONG TERM GOAL #6   Title Pt will demonstrate handwriting with legibility 80% or greater to write demographics and signature  on important paper.    Baseline 08/04/2021: Writing 70-80% legibility. EvaL difficulty with poor legibility    Time 6    Period Weeks    Status On-going    Target Date 08/23/21      OT LONG TERM GOAL #7   Title Pt will complete basic self care tasks with modified independence.    Baseline increased time and occasional assist    Time 6    Period Weeks    Status Achieved      OT LONG TERM GOAL #8   Title Pt to improve coordination by  reduction of time on 9 hole peg test by 10 secs to assist with performing coordination tasks with writing, buttons, zippers.    Baseline 08/04/2021: 46 sec. Eval: right 52 secs    Time 12    Period  Weeks    Status On-going    Target Date 10/04/21      OT LONG TERM GOAL  #9   TITLE Pt will improve right grip strength by 10# to open jars, containers with modified independence.    Baseline 08/04/2021: 39# Eval: right 28#    Time 12    Period Weeks    Status On-going    Target Date 10/04/21                   Plan - 08/24/21 1408     Clinical Impression Statement Pt reports that she did participate in a stove top kitchen task as requested by OT.  Pt reported that she made a grilled cheese with her sister, and gave no further information except that it was edible.  Pt reports she is able to hold onto a mayonnaise jar in the kitchen but nothing much heavier.  Pt will continue to benefit from skilled OT for improving strength and coordination throughout RUE in order to maximize indep with self care/IADLs/leisure.    OT Occupational Profile and History Detailed Assessment- Review of Records and additional review of physical, cognitive, psychosocial history related to current functional performance    Occupational performance deficits (Please refer to evaluation for details): ADL's;IADL's;Leisure    Body Structure / Function / Physical Skills ADL;Dexterity;Flexibility;Strength;Balance;Coordination;FMC;IADL;UE functional use;Mobility    Psychosocial Skills Environmental  Adaptations;Habits;Routines and Behaviors    Rehab Potential Good    Clinical Decision Making Limited treatment options, no task modification necessary    Comorbidities Affecting Occupational Performance: May have comorbidities impacting occupational performance    Modification or Assistance to Complete Evaluation  No modification of tasks or assist necessary to complete eval    OT Frequency 2x / week    OT Duration 12 weeks     OT Treatment/Interventions Self-care/ADL training;Cryotherapy;Therapeutic exercise;DME and/or AE instruction;Functional Mobility Training;Cognitive remediation/compensation;Balance training;Neuromuscular education;Manual Therapy;Splinting;Visual/perceptual remediation/compensation;Moist Heat;Contrast Bath;Therapeutic activities;Patient/family education    Consulted and Agree with Plan of Care Patient;Family member/caregiver    Family Member Consulted Sister             Patient will benefit from skilled therapeutic intervention in order to improve the following deficits and impairments:   Body Structure / Function / Physical Skills: ADL, Dexterity, Flexibility, Strength, Balance, Coordination, FMC, IADL, UE functional use, Mobility   Psychosocial Skills: Environmental  Adaptations, Habits, Routines and Behaviors   Visit Diagnosis: 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 artery (HCC)  Muscle weakness (generalized)  Other lack of coordination    Problem List Patient Active Problem List   Diagnosis Date Noted   Hyperlipidemia associated with type 2 diabetes mellitus ([place]) 06/02/2021   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) 06/02/2021   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 (CVA) 06/02/2021   Vitamin D deficiency 06/02/2021   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

## 2021-08-24 NOTE — Therapy (Signed)
[place] MAIN [facility] SERVICES [address] [place], [place], [address] Phone: [phone]   Fax:  [phone]  Speech Language Pathology Treatment  Patient Details  Name: Laura [name] MRN: [id number] Date of Birth: [birth year] Referring Provider (SLP): Lauraine Rinne, PA-C   Encounter Date: 08/24/2021   End of Session - 08/24/21 1359     Visit Number 11    Number of Visits 25    Date for SLP Re-Evaluation 10/04/21    Authorization Type [employer]    SLP Start Time 29    SLP Stop Time  1350    SLP Time Calculation (min) 50 min    Activity Tolerance Patient tolerated treatment well             Past Medical History:  Diagnosis Date   Acute CVA (cerebrovascular accident) ([place]) 05/24/2021   Diabetes mellitus without complication ([place]) 30/06/6225   Hyperlipidemia 05/23/2021   Hypertension 05/23/2021   Left forearm fracture    in high school--was casted   Stroke (cerebrum) ([place]) 05/26/2021   Stroke ([place]) 05/23/2021    Past Surgical History:  Procedure Laterality Date   FOOT SURGERY     FRACTURE SURGERY  ?   Foot fracture, no surgery   TONSILLECTOMY      There were no vitals filed for this visit.   Subjective Assessment - 08/24/21 1354     Subjective "Nothing."    Patient is accompained by: Family member   brother in-law   Currently in Pain? No/denies                   ADULT SLP TREATMENT - 08/24/21 1354       General Information   Behavior/Cognition Alert;Cooperative    HPI Laura [name] is a 70 year old female with past medical hx noted for DM II, HLD,   admitted at [facility] on 05/23/21 with garbled speech, right facial droop, vision changes and  RUE weakness. MRI brain showed acute infarct in left caudate head and anterior lentiform nucleus, chronic microvascular ischemia. Resulting transcortical motor aphasia, dysarthria and cognitive deficits. Blood sugar was 372 on admission. Admitted to CIR 6/22- 06/02/21.       Cognitive-Linquistic Treatment   Treatment focused on Aphasia;Cognition    Skilled Treatment Reviewed strategies for what to do when "stuck"; gave written choices: Describe, Act It Out, Later. Pt used description, gestures with frequent min-mod cues to have brother in-law "guess" stimuli provided. Reinforced using the "later" choice if stuck/perseverating on a word or topic. Provided tasks for home (written sentences). Pt has audiologic evaluation tomorrow.      Assessment / Recommendations / Plan   Plan Continue with current plan of care      Progression Toward Goals   Progression toward goals Progressing toward goals              SLP Education - 08/24/21 1358     Education Details what to do when "stuck" on a word    Person(s) Educated Patient    Methods Explanation    Comprehension Verbalized understanding;Returned demonstration;Verbal cues required;Need further instruction              SLP Short Term Goals - 08/19/21 1703       SLP SHORT TERM GOAL #1   Title Patient will name average of 8+ items for simple /concrete categories.    Time 10    Period --   sessions   Status Partially Met  Target Date 08/18/21      SLP SHORT TERM GOAL #2   Title Patient will generate fluent, grammatical, and cogent sentence to complete concrete linguistic tasks with 80% accuracy.    Time 10    Period --   sessions   Status Achieved    Target Date 08/18/21      SLP SHORT TERM GOAL #3   Title The patient will decode and read aloud sentences of varying length and complexity with 80% accuracy.    Time 10    Period --   sessions   Status Achieved    Target Date 08/18/21      SLP SHORT TERM GOAL #4   Title Patient will complete standardized assessment of cognitive communication skills.    Time 2    Period Weeks    Status Achieved    Target Date 07/21/21      SLP SHORT TERM GOAL #5   Title Patient will write logical/appropriate sentences of 8-10 words 80% accuracy with  min cues for double checking.    Time 10    Period --   sessions   Status New      Additional Short Term Goals   Additional Short Term Goals Yes      SLP SHORT TERM GOAL #6   Title Patient will use compensations for aphasia/dysarthria in 5 min conversation with occasional min cues.    Time 10    Period --   sessions   Status New      SLP SHORT TERM GOAL #7   Title Patient will identify when she is perseverating and choose an alternative (break, use a strategy), 75% of the time with min verbal cue.    Time 10    Period --   sessions   Status New              SLP Long Term Goals - 08/19/21 1706       SLP LONG TERM GOAL #1   Title Patient will use compensations for aphasia and dysarthria in 10 minutes moderately complex conversation to express thoughts and opinions with listener comprehension at least 90%.    Time 12    Period Weeks    Status On-going      SLP LONG TERM GOAL #2   Title Patient will write or verbally sequence steps to complete simple to mod complex personally relevant tasks (ADLs, IADLs).    Time 12    Period Weeks    Status On-going      SLP LONG TERM GOAL #3   Title Pt will read paragraph-level materials relating to personal interests, financial and medical matters demonstrating comprehension of details >90% accuracy.    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 Patient benefitted from written reminders and verbal cues for semantic description/cognitive flexibility. I recommend skilled ST to address aphasia, dysarthria, and cognitive-communication deficits in order to improve ability to communicate wants and needs and increase pt independence, as pt goal is to return to living independently.    Speech Therapy Frequency 2x / week    Duration 12 weeks     Treatment/Interventions Language facilitation;Environmental controls;Cueing hierarchy;SLP instruction and feedback;Compensatory techniques;Cognitive reorganization;Functional tasks;Compensatory strategies;Internal/external aids;Multimodal communcation approach;Patient/family education  Potential to Achieve Goals Good    SLP Home Exercise Plan TBD    Consulted and Agree with Plan of Care Patient;Family member/caregiver             Patient will benefit from skilled therapeutic intervention in order to improve the following deficits and impairments:   Aphasia  Cognitive communication deficit  Dysarthria and anarthria    Problem List Patient Active Problem List   Diagnosis Date Noted   Hyperlipidemia associated with type 2 diabetes mellitus ([place]) 06/02/2021   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) 06/02/2021   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 (CVA) 06/02/2021   Vitamin D deficiency 06/02/2021   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

## 2021-08-25 ENCOUNTER — Other Ambulatory Visit: Payer: Self-pay

## 2021-08-25 ENCOUNTER — Telehealth: Payer: Self-pay

## 2021-08-25 ENCOUNTER — Ambulatory Visit: Payer: Medicare Other | Attending: Internal Medicine | Admitting: Audiologist

## 2021-08-25 DIAGNOSIS — R4701 Aphasia: Secondary | ICD-10-CM | POA: Insufficient documentation

## 2021-08-25 DIAGNOSIS — I639 Cerebral infarction, unspecified: Secondary | ICD-10-CM | POA: Insufficient documentation

## 2021-08-25 DIAGNOSIS — H903 Sensorineural hearing loss, bilateral: Secondary | ICD-10-CM | POA: Diagnosis not present

## 2021-08-25 DIAGNOSIS — H9190 Unspecified hearing loss, unspecified ear: Secondary | ICD-10-CM

## 2021-08-25 NOTE — Procedures (Signed)
Outpatient Audiology and [place], [place]  [address] [phone]  AUDIOLOGICAL  EVALUATION  NAME: Laura [name]     DOB:   04/14/1951      MRN: [id number]                                                                                     DATE: 08/25/2021     REFERENT: Glean Hess, MD STATUS: Outpatient DIAGNOSIS: Asymmetric Sensorineural Hearing Loss   History: Laura [name] was seen for an audiological evaluation. Laura [name] was accompanied to the appointment by her sister.  Laura [name] is receiving a hearing evaluation due to concerns for difficulty hearing from her right ear ever since she had a stroke.  Laura [name] admitted to [facility] on 05/23/21 with garbled speech, right facial droop, vision changes and weakness. MRI of brain showed acute infarct in left caudate head and anterior lentiform nucleus, chronic microvascular ischemia. Resulting transcortical motor aphasia, dysarthria and cognitive deficits. Laura [name] is followed by speech therapist Benito Mccreedy SLP for rehabilitative treatment. Laura [name] has difficulty with multi step directions. She is able to repeat single words and answer yes/no questions. No pain or pressure reported in either ear. Tinnitus denied for both ears.  Medical history positive for diabetes and stroke which are risk factors for hearing loss. No other relevant case history reported.    Evaluation:  Otoscopy showed no view of the tympanic membranes due to non occluding cerumen, bilaterally Tympanometry results were consistent with normal middle ear pressure, bilaterally   Distortion Product Otoacoustic Emissions (DPOAE's) were screened as an objective cross check, absent 2k-6k Hz bilaterally   Audiometric testing was completed using conventional audiometry with supraural transducer due to cerumen. Speech Recognition Thresholds were consistent with pure tone averages. Word Recognition was good at an elevated level with masking, 88% in the  left ear and 72% in the right ear. Pure tone thresholds show normal sloping to moderate sensorineural hearing loss in the left ear and a moderate cookie bite sensorineural hearing loss in the right ear. Test results are consistent with report of asymmetric hearing. Responses were all reliable.  Instructions were modeled for [place]. Breaks in testing provided as needed. Re-instruction provided as needed. Word recognition performed over live voice with pauses between each word.   Results:  The test results were reviewed with Laura [name] and her sister. Laura [name] has an asymmetric moderate hearing loss.  Laura [name] needs to use drops to remove wax build up in her ears, see instructions below. A referral will be put in for her to start the process of receiving hearing aids. She needs hearing aids in both ears. Recommend an evaluation for medical clearance with an ENT first due to asymmetry in loss, though it is likely due to CVA. Her word recognition at elevated levels is still good, showing she will likely benefit from hearing aids. Sister was given two copies of Laura [name]'s hearing test results. A copy will be faxed to [facility], a HIPPA release was signed. Laura [name] should continue with all rehab services while in the process of getting hearing aids. Her speech therapist was notified of the hearing loss.    Recommendations:  Amplification is necessary for both ears. Hearing aids can be purchased from a variety of locations. See provided list for locations in the [place] area. Recommend practice with ENT and audiology. Debrox Earwax Removal Drops are a safe and inexpensive in-home solution for wax removal. Debrox Earwax Removal Kit includes a soft rubber bulb syringe to rinse your ear after using Debrox Earwax Removal Drops. Excessive earwax build-up can lead to ear discomfort and reduced hearing, which can affect your day-to-day life. The kit can be purchased over the counter at [place], [place], [employer], and most other pharmacies.   Referral to ENT Physician necessary due to asymmetric hearing loss. Need medical clearance for fitting of hearing aids.  Glean Hess, MD please send a referral for an ENT eval and hearing aid fitting to Dr. Redmond Baseman of [facility], part of [facility].    How to use the Debrox Earwax Removal Drops Kit:  tilt head sideways. place 5 to 10 drops into ear. tip of applicator should not enter ear canal. keep drops in ear for several minutes by keeping head tilted or placing cotton in the ear. use twice daily for up to four days  gently flush ear with water, using soft rubber bulb syringe after final treatment (on 4th day)   Alfonse Alpers  Audiologist, Au.D., CCC-A 08/25/2021  1:34 PM  Cc: Glean Hess, MD and Aliene Altes, [place]

## 2021-08-25 NOTE — Telephone Encounter (Signed)
Placed referral to Dr Jenne Pane at [facility]

## 2021-08-25 NOTE — Telephone Encounter (Signed)
-----   Message from Reubin Milan, MD sent at 08/25/2021  3:46 PM EDT -----  ----- Message ----- From: Barrie Folk, AUD Sent: 08/25/2021   3:07 PM EDT To: Reubin Milan, MD  Dr. Judithann Graves,  Please send a referral to [facility] Dr. Jenne Pane for medical clearance for hearing aids and hearing aid trial.  Please see report for details.  Ammie Ferrier Au.D. CCC-A Audiologist

## 2021-08-26 ENCOUNTER — Ambulatory Visit: Payer: Medicare Other | Admitting: Speech Pathology

## 2021-08-26 ENCOUNTER — Ambulatory Visit: Payer: Medicare Other

## 2021-08-26 ENCOUNTER — Other Ambulatory Visit: Payer: Self-pay

## 2021-08-26 ENCOUNTER — Ambulatory Visit: Payer: Medicare Other | Admitting: Occupational Therapy

## 2021-08-26 DIAGNOSIS — M6281 Muscle weakness (generalized): Secondary | ICD-10-CM

## 2021-08-26 DIAGNOSIS — R278 Other lack of coordination: Secondary | ICD-10-CM

## 2021-08-26 DIAGNOSIS — R269 Unspecified abnormalities of gait and mobility: Secondary | ICD-10-CM | POA: Diagnosis not present

## 2021-08-26 DIAGNOSIS — R262 Difficulty in walking, not elsewhere classified: Secondary | ICD-10-CM

## 2021-08-26 DIAGNOSIS — R2681 Unsteadiness on feet: Secondary | ICD-10-CM

## 2021-08-26 NOTE — Therapy (Signed)
[facility] MAIN [facility] SERVICES [address] [place], [place], [address] Phone: [phone]   Fax:  [phone]  Physical Therapy Treatment  Patient Details  Name: Laura [name] MRN: [id number] Date of Birth: 11-22-1951 Referring Provider (PT): Carlis Abbott Drema Pry, MD   Encounter Date: 08/26/2021   PT End of Session - 08/26/21 1509     Visit Number 13    Number of Visits 25    Date for PT Re-Evaluation 09/27/21    Authorization Type Medicare (traditional)    Authorization Time Period 07/05/21-09/27/21    Progress Note Due on Visit 20    PT Start Time 1516    PT Stop Time 1559    PT Time Calculation (min) 43 min    Equipment Utilized During Treatment Gait belt    Activity Tolerance Patient tolerated treatment well    Behavior During Therapy [facility] for tasks assessed/performed             Past Medical History:  Diagnosis Date   Acute CVA (cerebrovascular accident) (HCC) 05/24/2021   Diabetes mellitus without complication (HCC) 05/23/2021   Hyperlipidemia 05/23/2021   Hypertension 05/23/2021   Left forearm fracture    in high school--was casted   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   Foot fracture, no surgery   TONSILLECTOMY      There were no vitals filed for this visit.   Subjective Assessment - 08/26/21 1508     Subjective Patient reports no issues since last visit- States she had a good workout.    Patient is accompained by: Family member   sister   Pertinent History Pt presents to evaluation with her sister as pt has difficulty with speech. Pt reports she has not been using an AD. Pt confirms she was admitted to hospital d/t CVA on 05/23/2021. Pt sister reports they are unsure of the onset of the stroke. Pt's sister reports she checked on her sister after she received a birthday card and noticed her sister's  signature looked abnormal. She called the pt and reports her voice was slurred. When she checked on her she said she had R side facial droop, difficulty with R hand. Pt's sister reports pt initially declined going to the ED but agreed to go the following morning where she was diagnosed with a CVA. Once d/c from the hospital the pt went to rehab for a week. Pt was living alone, independently prior to her stroke, but now lives with her sister and her sister's husband. Pt's sister says her husband walks with pt 15 mins 2x/day. Pt's sister reports after pt has been walking for a while she will start to drag her L leg. She says this has improved with time. Pt now requires some assistance with ADLs, including dressing, making her bed, and self-feeding, showering. Pt's sister reports pt now has vision issues as well as balance problems. She reports pt loses balance with dressing herself. Other PMH includes: HTN, DMII, Vitamin b12 deficiency, HLD, Vitamin D deficiency. Pt also with dysphasia, aphasia, weakness d/t recent stroke.    Limitations Walking;Writing;Standing;Lifting;House hold activities;Reading    How long can you sit comfortably? not affected    How long can you stand comfortably? 15 min    How long can you walk comfortably? 15 minutes    Diagnostic tests per chart MRI brain  revealed acute nonhemorrhagic infarct in L caudate had and anterior lentiform nucelus and chronic microvascular   ischemia mildly advanced for age. CTA head neck negative for AVM, aneurysms or stenosis. 2D echo showing EF 65 to 70% with borderline LVH.    Patient Stated Goals Pt would like to go home again    Currently in Pain? No/denies              INTERVENTIONS:   Neuromuscular re-education:   Walking around cones (4) x1 lap each direction with verbal cues to increase step length for better foot clearance and coordination. Patient able to improve with VC and practice.  Progressed to dual task with stepping around cone  while holding 2kg ball x 4 cones x 4 trials back and forth and then progressed to stepping around cone while holding kick ball and pushing ball out away from body with feet apart and then bringing ball close to body when stepping together x1 lap each with moderate difficulty reported requiring mod VCs for sequencing and CGA for safety;   Side step over cones x 4 with VC to increase step length for improved foot clearance. Side step over cones with self ball toss- with 2 sec difference longer time recorded to walk over and back - 4 cones.   Side step over orange hurdle- x 12 reps without UE support - VC to take a wider side step.  Forward/backward step over hurdle x 12 reps without UE support  Therapeutic Exercises:  Sit to stand from edge of mat while holding red soft ball outstretched x 10 reps.  Sit to stand from edge of mat while holding red soft ball outstretched and then into calf raise x 10 reps.  Precor Leg press 55lb. BLE simultaneously- 10 reps x 2 trials.   Clinical Impression: Patient challenged today with balance activities- mild difficulty with coordination and some difficulty coordinating dual task activities. She was able to progress to more resistive LE strengthening including leg press without difficulty. Patient would benefit from additional skilled PT intervention to improve balance and mobility                        PT Education - 08/26/21 1508     Education Details balance exercise technique    Person(s) Educated Patient    Methods Explanation;Demonstration;Tactile cues;Verbal cues    Comprehension Verbalized understanding;Verbal cues required;Need further instruction;Returned demonstration              PT Short Term Goals - 08/12/21 1402       PT SHORT TERM GOAL #1   Title Patient will be independent in home exercise program to improve strength/mobility for better functional independence with ADLs.    Baseline 07/05/2021: to be initiated next  visit; 9/8: has been holding off due to medication side effect;    Time 6    Status On-going    Target Date 08/16/21               PT Long Term Goals - 08/12/21 1358       PT LONG TERM GOAL #1   Title Patient will increase FOTO score to equal to or greater than 79 to demonstrate statistically significant improvement in mobility and quality of life.    Baseline 8/1: 70    Time 12    Period Weeks    Status On-going    Target Date 09/27/21      PT LONG TERM GOAL #2  Title Patient will increase Berg Balance score by > 6 points to demonstrate decreased fall risk during functional activities.    Baseline 8/1: 46/56    Time 12    Period Weeks    Status On-going    Target Date 09/27/21      PT LONG TERM GOAL #3   Title The pt will ambulate at least 1700 ft during to demonstrate return toward age norm gait ability.    Baseline 8/1: 1398 ft no AD, CGA; 9/8: 1460 feet;    Time 12    Period Weeks    Status On-going    Target Date 09/27/21      PT LONG TERM GOAL #4   Title Pt to demonstrate improvement in Mini-BesTest score to 22/28 or higher to represent improved balance and reduced falls risk.    Baseline On 07/22/21: 15/28, 9/8: 18/28    Time 8    Period Weeks    Status New                   Plan - 08/26/21 1509     Clinical Impression Statement Patient challenged today with balance activities- mild difficulty with coordination and some difficulty coordinating dual task activities. She was able to progress to more resistive LE strengthening including leg press without difficulty. Patient would benefit from additional skilled PT intervention to improve balance and mobility    Personal Factors and Comorbidities Comorbidity 1;Comorbidity 2;Comorbidity 3+;Sex;Age    Comorbidities HTN, DMII, dysphasia, aphasia, weakness,    Examination-Activity Limitations Locomotion Level;Stairs;Dressing;Bathing;Reach Overhead;Lift;Caring for Others    Examination-Participation  Restrictions Driving;Medication Management;Personal Finances;Cleaning;Meal Prep;Yard Work;Community Activity;Shop;Other;Laundry    Stability/Clinical Decision Making Evolving/Moderate complexity    Rehab Potential Good    PT Frequency 2x / week    PT Duration 12 weeks    PT Treatment/Interventions ADLs/Self Care Home Management;Biofeedback;Canalith Repostioning;Aquatic Therapy;Cryotherapy;Moist Heat;DME Instruction;Gait training;Stair training;Functional mobility training;Therapeutic activities;Therapeutic exercise;Balance training;Neuromuscular re-education;Cognitive remediation;Patient/family education;Orthotic Fit/Training;Manual techniques;Passive range of motion;Energy conservation;Taping;Splinting;Vestibular;Visual/perceptual remediation/compensation;Joint Manipulations    PT Next Visit Plan strength, endurance, balance and gait interventions    PT Home Exercise Plan No changes to HEP this visit    Consulted and Agree with Plan of Care Patient;Family member/caregiver    Family Member Consulted sister             Patient will benefit from skilled therapeutic intervention in order to improve the following deficits and impairments:  Abnormal gait, Decreased activity tolerance, Decreased endurance, Decreased range of motion, Decreased strength, Hypomobility, Improper body mechanics, Decreased balance, Decreased coordination, Decreased mobility, Difficulty walking, Impaired flexibility, Postural dysfunction, Impaired vision/preception, Impaired sensation  Visit Diagnosis: Abnormality of gait and mobility  Difficulty in walking, not elsewhere classified  Muscle weakness (generalized)  Unsteadiness on feet     Problem List 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 [place], [place],  [address] Phone: [phone]   Fax:  [phone]  Name: Laura [name] MRN: [id number] Date of Birth: 09/05/1951

## 2021-08-27 ENCOUNTER — Encounter: Payer: Self-pay | Admitting: Occupational Therapy

## 2021-08-27 NOTE — Therapy (Signed)
[place] MAIN [facility] SERVICES [address] [place], [place], [address] Phone: [phone]   Fax:  [phone]  Occupational Therapy Treatment  Patient Details  Name: Laura [name] MRN: [id number] Date of Birth: Jan 11, 1951 No data recorded  Encounter Date: 08/26/2021   OT End of Session - 08/27/21 1922     Visit Number 16    Number of Visits 24    Date for OT Re-Evaluation 10/04/21    Authorization Type Progress report starting 08/04/2021    OT Start Time 1430    OT Stop Time 1515    OT Time Calculation (min) 45 min    Activity Tolerance Patient tolerated treatment well    Behavior During Therapy [facility] for tasks assessed/performed             Past Medical History:  Diagnosis Date   Acute CVA (cerebrovascular accident) ([place]) 05/24/2021   Diabetes mellitus without complication ([place]) 87/56/4332   Hyperlipidemia 05/23/2021   Hypertension 05/23/2021   Left forearm fracture    in high school--was casted   Stroke (cerebrum) ([place]) 05/26/2021   Stroke ([place]) 05/23/2021    Past Surgical History:  Procedure Laterality Date   FOOT SURGERY     FRACTURE SURGERY  ?   Foot fracture, no surgery   TONSILLECTOMY      There were no vitals filed for this visit.   Subjective Assessment - 08/27/21 1921     Subjective  Pt. reports having had an audiologist appointment    Patient is accompanied by: Family member    Pertinent History Per chart, Laura [name] is a 70 y.o. female in relatively good health but no medical care for years who was admitted on 05/23/2021 to [facility] with garbled speech, right facial droop, vision changes and right upper committee weakness.  Per reports she started feeling weak on 06/14 but refused to come to the hospital.  MRI brain done revealing acute nonhemorrhagic infarct in left caudate head and anterior lentiform nucleus as well as chronic microvascular ischemia mildly advanced for age.  CTA head/neck was negative for AVM,  aneurysm or stenosis.  She was noted to have low vitamin B12 level at 97 and was started on supplementation.  Stroke was felt to be due to small vessel disease and neurology recommended DAPT x3 weeks followed by aspirin alone.  She continued to be limited by weakness with dysarthria, tachycardia with activity, balance deficits as well as motor planning deficits with ADLs.  CIR was recommended due to functional decline from 05-26-2021 to 06-02-2021.    Patient Stated Goals Pt would like to go home and be as independent as possible    Currently in Pain? No/denies            OT TREATMENT    Neuro muscular re-education:  Therapeutic Exercise:  Pt. worked on the [employer] for 8 min. with constant monitoring of the BUEs. Pt. worked on changing, and alternating forward reverse position every 2 min. Rest breaks were required. Pt. performed 2# dumbbell ex. for bilateral shoulder flexion, abduction, elbow flexion and extension,  2# for forearm supination/pronation, wrist flexion/extension, and radial deviation. Pt. worked on these exercises while in standing in front of a mirror for visual cues to maintain symmetry, and alignment during the exercises.   Neuromuscular reeducation:  Pt. worked on grasping, flipping, turning, and stacking 4 rows of 15 [place] style discs. Pt. required visual demonstration, and cues for movement patterns. Pt. worked on speed, and Soil scientist.  Patient worked on transitory movements of the hand moving the [place] desks from the palm of her hand to the tip of her digit and thumb.   The patient and her sister report the patient had an audiologist appointment.  We will have a follow-up appointment regarding hearing aid device options for her right ear. Pt. required visual demonstration, and cues for movement patterns.  Patient continues to make progress with upper extremity strength, motor control, hand function, and coordination skills. Pt. required verbal cues for form,  and technique, as well as for forearm stabilization during wrist exercises.  Patient continues to work on improving right upper extremity strength, and fine motor coordination skills in preparation for improving hand function skills needed for engaging efficiently during ADLs and IADL tasks.               OT Education - 08/27/21 1922     Education Details [facility]    Person(s) Educated Patient;Caregiver(s)    Methods Explanation;Verbal cues;Handout    Comprehension Verbalized understanding;Need further instruction;Verbal cues required                 OT Long Term Goals - 08/04/21 1321       OT LONG TERM GOAL #1   Title Pt will be independent with home exercise program.    Baseline 08/04/2021: Independent with Theraputty HEP.    Time 12    Period Weeks    Status Partially Met    Target Date 10/04/21      OT LONG TERM GOAL #2   Title Pt will improve FOTO score to 73 or above to demonstrate a clinically relevant change to impact greater independence in necessary daily tasks.    Baseline 08/04/2021: 65 Eval score of 61    Time 12    Period Weeks    Status New    Target Date 10/04/21      OT LONG TERM GOAL #3   Title Pt will complete light meal prep with modified independence.    Baseline 08/04/2021: Pt. is now preparing lunch sandwiches  independently. pt. is able to use the microwave. Pt. requires assist from family    Time 12    Period Weeks    Status Partially Met    Target Date 10/04/21      OT LONG TERM GOAL #4   Title Pt will complete laundry tasks with modified independence    Baseline 08/04/2021: Pt. is able to wash, transfer, and fold laundry. Pt. has improved, however requires occ. assist with the laundry controls. Eval: requires assist from family    Time 12    Period Weeks    Status New    Target Date 10/04/21      OT LONG TERM GOAL #5   Title Pt will complete check writing with modified independence.    Baseline 08/04/2021: Pt. was able to  independently complete, and has her sister review it. Eval:  difficulty    Time 6    Period Weeks    Status Achieved      OT LONG TERM GOAL #6   Title Pt will demonstrate handwriting with legibility 80% or greater to write demographics and signature  on important paper.    Baseline 08/04/2021: Writing 70-80% legibility. EvaL difficulty with poor legibility    Time 6    Period Weeks    Status On-going    Target Date 08/23/21      OT LONG TERM GOAL #7   Title Pt  will complete basic self care tasks with modified independence.    Baseline increased time and occasional assist    Time 6    Period Weeks    Status Achieved      OT LONG TERM GOAL #8   Title Pt to improve coordination by reduction of time on 9 hole peg test by 10 secs to assist with performing coordination tasks with writing, buttons, zippers.    Baseline 08/04/2021: 46 sec. Eval: right 52 secs    Time 12    Period Weeks    Status On-going    Target Date 10/04/21      OT LONG TERM GOAL  #9   TITLE Pt will improve right grip strength by 10# to open jars, containers with modified independence.    Baseline 08/04/2021: 39# Eval: right 28#    Time 12    Period Weeks    Status On-going    Target Date 10/04/21                   Plan - 08/27/21 1923     Clinical Impression Statement The patient and her sister report the patient had an audiologist appointment. Pt. will have a follow-up appointment regarding hearing aid device options for her right ear. Pt. required visual demonstration, and cues for movement patterns.  Patient continues to make progress with right upper extremity strength, motor control, hand function, and coordination skills. Pt. required verbal cues for form, and technique, as well as for forearm stabilization during wrist exercises.  Patient continues to work on improving right upper extremity strength, and fine motor coordination skills in preparation for improving hand function skills needed for  engaging efficiently during ADLs and IADL tasks.   OT Occupational Profile and History Detailed Assessment- Review of Records and additional review of physical, cognitive, psychosocial history related to current functional performance    Occupational performance deficits (Please refer to evaluation for details): ADL's;IADL's;Leisure    Body Structure / Function / Physical Skills ADL;Dexterity;Flexibility;Strength;Balance;Coordination;FMC;IADL;UE functional use;Mobility    Psychosocial Skills Environmental  Adaptations;Habits;Routines and Behaviors    Rehab Potential Good    Clinical Decision Making Limited treatment options, no task modification necessary    Comorbidities Affecting Occupational Performance: May have comorbidities impacting occupational performance    Modification or Assistance to Complete Evaluation  No modification of tasks or assist necessary to complete eval    OT Frequency 2x / week    OT Duration 12 weeks    OT Treatment/Interventions Self-care/ADL training;Cryotherapy;Therapeutic exercise;DME and/or AE instruction;Functional Mobility Training;Cognitive remediation/compensation;Balance training;Neuromuscular education;Manual Therapy;Splinting;Visual/perceptual remediation/compensation;Moist Heat;Contrast Bath;Therapeutic activities;Patient/family education    Consulted and Agree with Plan of Care Patient;Family member/caregiver    Family Member Consulted Sister             Patient will benefit from skilled therapeutic intervention in order to improve the following deficits and impairments:   Body Structure / Function / Physical Skills: ADL, Dexterity, Flexibility, Strength, Balance, Coordination, FMC, IADL, UE functional use, Mobility   Psychosocial Skills: Environmental  Adaptations, Habits, Routines and Behaviors   Visit Diagnosis: Muscle weakness (generalized)  Other lack of coordination    Problem List Patient Active Problem List   Diagnosis Date Noted    Hyperlipidemia associated with type 2 diabetes mellitus ([place]) 06/02/2021   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) 06/02/2021   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 (CVA) 06/02/2021   Vitamin D deficiency 06/02/2021   Aphasia due to acute stroke ([place]) 06/02/2021  Stroke (cerebrum) ([place]) 05/26/2021   Type II diabetes mellitus with complication (HCC)    Vitamin B12 deficiency    Essential hypertension 05/23/2021   Eyesight diminished 12/05/2020   Diminished hearing 12/05/2020    Harrel Carina, MS, OTR/L 08/27/2021, 7:25 PM  [place] MAIN [facility] SERVICES [place], [place], [address] Phone: [phone]   Fax:  [phone]  Name: Laura [name] MRN: [id number] Date of Birth: 16-Sep-1951

## 2021-08-30 ENCOUNTER — Ambulatory Visit: Payer: Medicare Other | Admitting: Occupational Therapy

## 2021-08-30 ENCOUNTER — Other Ambulatory Visit: Payer: Self-pay

## 2021-08-30 ENCOUNTER — Ambulatory Visit: Payer: Medicare Other | Admitting: Speech Pathology

## 2021-08-30 ENCOUNTER — Encounter: Payer: Self-pay | Admitting: Occupational Therapy

## 2021-08-30 ENCOUNTER — Ambulatory Visit: Payer: Medicare Other

## 2021-08-30 DIAGNOSIS — R4701 Aphasia: Secondary | ICD-10-CM

## 2021-08-30 DIAGNOSIS — R278 Other lack of coordination: Secondary | ICD-10-CM

## 2021-08-30 DIAGNOSIS — R269 Unspecified abnormalities of gait and mobility: Secondary | ICD-10-CM | POA: Diagnosis not present

## 2021-08-30 DIAGNOSIS — R41841 Cognitive communication deficit: Secondary | ICD-10-CM

## 2021-08-30 DIAGNOSIS — M6281 Muscle weakness (generalized): Secondary | ICD-10-CM

## 2021-08-30 NOTE — Therapy (Signed)
[place] MAIN [facility] SERVICES [address] [place], [place], [address] Phone: [phone]   Fax:  [phone]  Speech Language Pathology Treatment  Patient Details  Name: Laura [name] MRN: [id number] Date of Birth: [birth year] Referring Provider (SLP): Lauraine Rinne, PA-C   Encounter Date: 08/30/2021   End of Session - 08/30/21 1910     Visit Number 12    Number of Visits 25    Date for SLP Re-Evaluation 10/04/21    Authorization Type [employer]    SLP Start Time 1400    SLP Stop Time  1500    SLP Time Calculation (min) 60 min    Activity Tolerance Patient tolerated treatment well             Past Medical History:  Diagnosis Date   Acute CVA (cerebrovascular accident) ([place]) 05/24/2021   Diabetes mellitus without complication ([place]) 79/89/2119   Hyperlipidemia 05/23/2021   Hypertension 05/23/2021   Left forearm fracture    in high school--was casted   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   Foot fracture, no surgery   TONSILLECTOMY      There were no vitals filed for this visit.   Subjective Assessment - 08/30/21 1904     Subjective Wrote sentences for homework    Patient is accompained by: Family member   sister   Currently in Pain? No/denies                   ADULT SLP TREATMENT - 08/30/21 1905       General Information   Behavior/Cognition Alert;Cooperative    HPI Laura [name] is a 70 year old female with past medical hx noted for DM II, HLD,   admitted at [facility] on 05/23/21 with garbled speech, right facial droop, vision changes and  RUE weakness. MRI brain showed acute infarct in left caudate head and anterior lentiform nucleus, chronic microvascular ischemia. Resulting transcortical motor aphasia, dysarthria and cognitive deficits. Blood sugar was 372 on admission. Admitted to CIR 6/22-  06/02/21.      Cognitive-Linquistic Treatment   Treatment focused on Aphasia;Cognition    Skilled Treatment Targeted naming, attention, sequencing, and written communication in functional task. Pt generated list of ingredients, supplies for making pancakes with occasional min-mod question cues. Required mod-max cues for generating sequence of steps in order (attention and language). Benefitted from rephrasing/summarizing/visualization of activity.      Assessment / Recommendations / Plan   Plan Continue with current plan of care      Progression Toward Goals   Progression toward goals Progressing toward goals              SLP Education - 08/30/21 1909     Education Details talk through the steps    Person(s) Educated Patient    Methods Explanation    Comprehension Verbalized understanding              SLP Short Term Goals - 08/19/21 1703       SLP SHORT TERM GOAL #1   Title Patient will name average of 8+ items for simple /concrete categories.    Time 10    Period --   sessions   Status Partially Met    Target Date 08/18/21      SLP SHORT TERM GOAL #2   Title Patient will  generate fluent, grammatical, and cogent sentence to complete concrete linguistic tasks with 80% accuracy.    Time 10    Period --   sessions   Status Achieved    Target Date 08/18/21      SLP SHORT TERM GOAL #3   Title The patient will decode and read aloud sentences of varying length and complexity with 80% accuracy.    Time 10    Period --   sessions   Status Achieved    Target Date 08/18/21      SLP SHORT TERM GOAL #4   Title Patient will complete standardized assessment of cognitive communication skills.    Time 2    Period Weeks    Status Achieved    Target Date 07/21/21      SLP SHORT TERM GOAL #5   Title Patient will write logical/appropriate sentences of 8-10 words 80% accuracy with min cues for double checking.    Time 10    Period --   sessions   Status New       Additional Short Term Goals   Additional Short Term Goals Yes      SLP SHORT TERM GOAL #6   Title Patient will use compensations for aphasia/dysarthria in 5 min conversation with occasional min cues.    Time 10    Period --   sessions   Status New      SLP SHORT TERM GOAL #7   Title Patient will identify when she is perseverating and choose an alternative (break, use a strategy), 75% of the time with min verbal cue.    Time 10    Period --   sessions   Status New              SLP Long Term Goals - 08/19/21 1706       SLP LONG TERM GOAL #1   Title Patient will use compensations for aphasia and dysarthria in 10 minutes moderately complex conversation to express thoughts and opinions with listener comprehension at least 90%.    Time 12    Period Weeks    Status On-going      SLP LONG TERM GOAL #2   Title Patient will write or verbally sequence steps to complete simple to mod complex personally relevant tasks (ADLs, IADLs).    Time 12    Period Weeks    Status On-going      SLP LONG TERM GOAL #3   Title Pt will read paragraph-level materials relating to personal interests, financial and medical matters demonstrating comprehension of details >90% accuracy.    Time 12    Period Weeks    Status On-going              Plan - 08/30/21 1910     Clinical Impression Statement Laura [name] presents with mild anomic aphasia, mild dysarthria, and mild-moderate cognitive deficits s/p CVA. Pt had difficulty with verbal/written sequencing with extended time, mod-max cues required to generate steps to functional task. I recommend skilled ST to address aphasia, dysarthria, and cognitive-communication deficits in order to improve ability to communicate wants and needs and increase pt independence, as pt goal is to return to living independently.    Speech Therapy Frequency 2x / week    Duration 12 weeks    Treatment/Interventions Language facilitation;Environmental controls;Cueing  hierarchy;SLP instruction and feedback;Compensatory techniques;Cognitive reorganization;Functional tasks;Compensatory strategies;Internal/external aids;Multimodal communcation approach;Patient/family education    Potential to Achieve Goals Good    SLP Home Exercise Plan  TBD    Consulted and Agree with Plan of Care Patient;Family member/caregiver             Patient will benefit from skilled therapeutic intervention in order to improve the following deficits and impairments:   Aphasia  Cognitive communication deficit    Problem List Patient Active Problem List   Diagnosis Date Noted   Hyperlipidemia associated with type 2 diabetes mellitus ([place]) 06/02/2021   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 [place], [place], [address] Phone: [phone]   Fax:  [phone]   Name: Laura [name] MRN: [id number] Date of Birth: Sep 21, 1951

## 2021-08-30 NOTE — Therapy (Signed)
[place] MAIN [facility] SERVICES [address] [place], [place], [address] Phone: [phone]   Fax:  [phone]  Occupational Therapy Treatment  Patient Details  Name: Laura [name] MRN: [id number] Date of Birth: [birth year] No data recorded  Encounter Date: 08/30/2021   OT End of Session - 08/30/21 1331     Visit Number 17    Number of Visits 24    Date for OT Re-Evaluation 10/04/21    Authorization Type Progress report starting 08/04/2021    OT Start Time 1430    OT Stop Time 1515    OT Time Calculation (min) 45 min    Activity Tolerance Patient tolerated treatment well    Behavior During Therapy [facility] for tasks assessed/performed             Past Medical History:  Diagnosis Date   Acute CVA (cerebrovascular accident) ([place]) 05/24/2021   Diabetes mellitus without complication ([place]) 08/67/6195   Hyperlipidemia 05/23/2021   Hypertension 05/23/2021   Left forearm fracture    in high school--was casted   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   Foot fracture, no surgery   TONSILLECTOMY      There were no vitals filed for this visit.   Subjective Assessment - 08/30/21 1330     Subjective  Pt. reports that she was able to visit with her dog "Spike" this weekend.    Patient is accompanied by: Family member    Pertinent History Per chart, Laura [name] is a 70 y.o. female in relatively good health but no medical care for years who was admitted on 05/23/2021 to [facility] with garbled speech, right facial droop, vision changes and right upper committee weakness.  Per reports she started feeling weak on 06/14 but refused to come to the hospital.  MRI brain done revealing acute nonhemorrhagic infarct in left caudate head and anterior lentiform nucleus as well as chronic microvascular ischemia mildly advanced for age.  CTA head/neck  was negative for AVM, aneurysm or stenosis.  She was noted to have low vitamin B12 level at 97 and was started on supplementation.  Stroke was felt to be due to small vessel disease and neurology recommended DAPT x3 weeks followed by aspirin alone.  She continued to be limited by weakness with dysarthria, tachycardia with activity, balance deficits as well as motor planning deficits with ADLs.  CIR was recommended due to functional decline from 05-26-2021 to 06-02-2021.    Currently in Pain? No/denies              OT TREATMENT     Therapeutic Exercise:   Pt. worked on the [employer] for 8 min. with constant monitoring of the BUEs. Pt. worked on changing, and alternating forward reverse position every 2 min. Rest breaks were required. Pt. performed 3# dumbbell ex. for bilateral shoulder flexion, abduction, elbow flexion and extension,  forearm supination/pronation, wrist flexion/extension, and radial deviation. Pt. worked on these exercises while in standing in front of a mirror for visual cues to maintain symmetry, and alignment during the exercises.    Neuromuscular reeducation:   Pt. worked on  right hand [facility] skills grasping 1/2" circular tipped pegs. Pt. worked on translatory movements of the right hand moving the pegs from the tip of her fingers to her palm, and back out to the tips of her 2nd  digit and thumb in preparation for placing them onto a pegboard. Pt. worked on removing the pegs while grasping, and storing the pegs in the palm of her right hand.   Pt. required visual demonstration, and cues for movement patterns initially when grasping the small circular tipped objects. Patient continues to make progress with upper extremity strength, motor control, hand function, and coordination skills. Pt. was able to tolerate increased resistance with 3# handheld weights. Pt. required verbal cues for form, and technique, as well as for forearm stabilization when working on the distal UE exercises.  Patient continues to work on improving right upper extremity strength, and fine motor coordination skills in preparation for improving hand function skills needed for engaging efficiently during ADLs and IADL tasks.                          OT Education - 08/30/21 1331     Education Details [place]    Person(s) Educated Patient;Caregiver(s)    Methods Explanation;Verbal cues;Handout    Comprehension Verbalized understanding;Need further instruction;Verbal cues required                 OT Long Term Goals - 08/04/21 1321       OT LONG TERM GOAL #1   Title Pt will be independent with home exercise program.    Baseline 08/04/2021: Independent with [employer] HEP.    Time 12    Period Weeks    Status Partially Met    Target Date 10/04/21      OT LONG TERM GOAL #2   Title Pt will improve FOTO score to 73 or above to demonstrate a clinically relevant change to impact greater independence in necessary daily tasks.    Baseline 08/04/2021: 65 Eval score of 61    Time 12    Period Weeks    Status New    Target Date 10/04/21      OT LONG TERM GOAL #3   Title Pt will complete light meal prep with modified independence.    Baseline 08/04/2021: Pt. is now preparing lunch sandwiches  independently. pt. is able to use the microwave. Pt. requires assist from family    Time 12    Period Weeks    Status Partially Met    Target Date 10/04/21      OT LONG TERM GOAL #4   Title Pt will complete laundry tasks with modified independence    Baseline 08/04/2021: Pt. is able to wash, transfer, and fold laundry. Pt. has improved, however requires occ. assist with the laundry controls. Eval: requires assist from family    Time 12    Period Weeks    Status New    Target Date 10/04/21      OT LONG TERM GOAL #5   Title Pt will complete check writing with modified independence.    Baseline 08/04/2021: Pt. was able to independently complete, and has her sister review it. Eval:   difficulty    Time 6    Period Weeks    Status Achieved      OT LONG TERM GOAL #6   Title Pt will demonstrate handwriting with legibility 80% or greater to write demographics and signature  on important paper.    Baseline 08/04/2021: Writing 70-80% legibility. EvaL difficulty with poor legibility    Time 6    Period Weeks    Status On-going    Target Date 08/23/21      OT  LONG TERM GOAL #7   Title Pt will complete basic self care tasks with modified independence.    Baseline increased time and occasional assist    Time 6    Period Weeks    Status Achieved      OT LONG TERM GOAL #8   Title Pt to improve coordination by reduction of time on 9 hole peg test by 10 secs to assist with performing coordination tasks with writing, buttons, zippers.    Baseline 08/04/2021: 46 sec. Eval: right 52 secs    Time 12    Period Weeks    Status On-going    Target Date 10/04/21      OT LONG TERM GOAL  #9   TITLE Pt will improve right grip strength by 10# to open jars, containers with modified independence.    Baseline 08/04/2021: 39# Eval: right 28#    Time 12    Period Weeks    Status On-going    Target Date 10/04/21                   Plan - 08/30/21 1332     Clinical Impression Statement Pt. required visual demonstration, and cues for movement patterns initially when grasping the small circular tipped objects. Patient continues to make progress with upper extremity strength, motor control, hand function, and coordination skills. Pt. was able to tolerate increased resistance with 3# handheld weights. Pt. required verbal cues for form, and technique, as well as for forearm stabilization when working on the distal UE exercises. Patient continues to work on improving right upper extremity strength, and fine motor coordination skills in preparation for improving hand function skills needed for engaging efficiently during ADLs and IADL tasks.   OT Occupational Profile and History Detailed  Assessment- Review of Records and additional review of physical, cognitive, psychosocial history related to current functional performance    Occupational performance deficits (Please refer to evaluation for details): ADL's;IADL's;Leisure    Body Structure / Function / Physical Skills ADL;Dexterity;Flexibility;Strength;Balance;Coordination;FMC;IADL;UE functional use;Mobility    Psychosocial Skills Environmental  Adaptations;Habits;Routines and Behaviors    Rehab Potential Good    Clinical Decision Making Limited treatment options, no task modification necessary    Comorbidities Affecting Occupational Performance: May have comorbidities impacting occupational performance    Modification or Assistance to Complete Evaluation  No modification of tasks or assist necessary to complete eval    OT Frequency 2x / week    OT Duration 12 weeks    OT Treatment/Interventions Self-care/ADL training;Cryotherapy;Therapeutic exercise;DME and/or AE instruction;Functional Mobility Training;Cognitive remediation/compensation;Balance training;Neuromuscular education;Manual Therapy;Splinting;Visual/perceptual remediation/compensation;Moist Heat;Contrast Bath;Therapeutic activities;Patient/family education    Consulted and Agree with Plan of Care Patient;Family member/caregiver             Patient will benefit from skilled therapeutic intervention in order to improve the following deficits and impairments:   Body Structure / Function / Physical Skills: ADL, Dexterity, Flexibility, Strength, Balance, Coordination, FMC, IADL, UE functional use, Mobility   Psychosocial Skills: Environmental  Adaptations, Habits, Routines and Behaviors   Visit Diagnosis: Muscle weakness (generalized)  Other lack of coordination    Problem List Patient Active Problem List   Diagnosis Date Noted   Hyperlipidemia associated with type 2 diabetes mellitus ([place]) 06/02/2021   Dysphasia as late effect of cerebrovascular accident  (CVA) 06/02/2021   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 (CVA) 06/02/2021   Vitamin D deficiency 06/02/2021   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 [place], [place], [address] Phone: [phone]   Fax:  [phone]  Name: Laura [name] MRN: [id number] Date of Birth: 1951/04/13

## 2021-09-01 ENCOUNTER — Other Ambulatory Visit: Payer: Self-pay

## 2021-09-01 ENCOUNTER — Ambulatory Visit: Payer: Medicare Other | Admitting: Occupational Therapy

## 2021-09-01 ENCOUNTER — Ambulatory Visit: Payer: Medicare Other | Admitting: Physical Therapy

## 2021-09-01 ENCOUNTER — Ambulatory Visit: Payer: Medicare Other | Admitting: Speech Pathology

## 2021-09-01 ENCOUNTER — Encounter: Payer: Self-pay | Admitting: Occupational Therapy

## 2021-09-01 ENCOUNTER — Encounter: Payer: Self-pay | Admitting: Physical Therapy

## 2021-09-01 DIAGNOSIS — M6281 Muscle weakness (generalized): Secondary | ICD-10-CM

## 2021-09-01 DIAGNOSIS — R269 Unspecified abnormalities of gait and mobility: Secondary | ICD-10-CM

## 2021-09-01 DIAGNOSIS — R4701 Aphasia: Secondary | ICD-10-CM

## 2021-09-01 DIAGNOSIS — R262 Difficulty in walking, not elsewhere classified: Secondary | ICD-10-CM

## 2021-09-01 DIAGNOSIS — R2681 Unsteadiness on feet: Secondary | ICD-10-CM

## 2021-09-01 DIAGNOSIS — R41841 Cognitive communication deficit: Secondary | ICD-10-CM

## 2021-09-01 DIAGNOSIS — R278 Other lack of coordination: Secondary | ICD-10-CM

## 2021-09-01 NOTE — Therapy (Signed)
[place] MAIN [facility] SERVICES [address] [place], [place], [address] Phone: [phone]   Fax:  [phone]  Speech Language Pathology Treatment  Patient Details  Name: Laura [name] MRN: [id number] Date of Birth: [birth year] Referring Provider (SLP): Lauraine Rinne, PA-C   Encounter Date: 09/01/2021   End of Session - 09/01/21 1547     Visit Number 13    Number of Visits 25    Date for SLP Re-Evaluation 10/04/21    Authorization Type [employer]    SLP Start Time 44    SLP Stop Time  1500    SLP Time Calculation (min) 60 min    Activity Tolerance Patient tolerated treatment well             Past Medical History:  Diagnosis Date   Acute CVA (cerebrovascular accident) ([place]) 05/24/2021   Diabetes mellitus without complication ([place]) 59/16/3846   Hyperlipidemia 05/23/2021   Hypertension 05/23/2021   Left forearm fracture    in high school--was casted   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   Foot fracture, no surgery   TONSILLECTOMY      There were no vitals filed for this visit.   Subjective Assessment - 09/01/21 1530     Subjective Responds but does not initiate conversation.    Currently in Pain? No/denies                   ADULT SLP TREATMENT - 09/01/21 1544       General Information   Behavior/Cognition Alert;Cooperative    HPI Laura [name] is a 70 year old female with past medical hx noted for DM II, HLD,   admitted at [facility] on 05/23/21 with garbled speech, right facial droop, vision changes and  RUE weakness. MRI brain showed acute infarct in left caudate head and anterior lentiform nucleus, chronic microvascular ischemia. Resulting transcortical motor aphasia, dysarthria and cognitive deficits. Blood sugar was 372 on admission. Admitted to CIR 6/22- 06/02/21.      Cognitive-Linquistic  Treatment   Treatment focused on Aphasia;Cognition    Skilled Treatment Targeted verbal/written expression and sequencing. Pt sequenced activities and generated 4 sentences of 6-12 words for functional sequences with extended time, visual supports and min-mod cues. Was able to generate a sentence for a logical "next step" but had greater difficulty without visual representation. Sequencing 100% accuracy with extended time required.      Assessment / Recommendations / Plan   Plan Continue with current plan of care      Progression Toward Goals   Progression toward goals Progressing toward goals                SLP Short Term Goals - 08/19/21 1703       SLP SHORT TERM GOAL #1   Title Patient will name average of 8+ items for simple /concrete categories.    Time 10    Period --   sessions   Status Partially Met    Target Date 08/18/21      SLP SHORT TERM GOAL #2   Title Patient will generate fluent, grammatical, and cogent sentence to complete concrete linguistic tasks with 80% accuracy.    Time 10    Period --   sessions   Status Achieved    Target Date 08/18/21      SLP  SHORT TERM GOAL #3   Title The patient will decode and read aloud sentences of varying length and complexity with 80% accuracy.    Time 10    Period --   sessions   Status Achieved    Target Date 08/18/21      SLP SHORT TERM GOAL #4   Title Patient will complete standardized assessment of cognitive communication skills.    Time 2    Period Weeks    Status Achieved    Target Date 07/21/21      SLP SHORT TERM GOAL #5   Title Patient will write logical/appropriate sentences of 8-10 words 80% accuracy with min cues for double checking.    Time 10    Period --   sessions   Status New      Additional Short Term Goals   Additional Short Term Goals Yes      SLP SHORT TERM GOAL #6   Title Patient will use compensations for aphasia/dysarthria in 5 min conversation with occasional min cues.    Time 10     Period --   sessions   Status New      SLP SHORT TERM GOAL #7   Title Patient will identify when she is perseverating and choose an alternative (break, use a strategy), 75% of the time with min verbal cue.    Time 10    Period --   sessions   Status New              SLP Long Term Goals - 08/19/21 1706       SLP LONG TERM GOAL #1   Title Patient will use compensations for aphasia and dysarthria in 10 minutes moderately complex conversation to express thoughts and opinions with listener comprehension at least 90%.    Time 12    Period Weeks    Status On-going      SLP LONG TERM GOAL #2   Title Patient will write or verbally sequence steps to complete simple to mod complex personally relevant tasks (ADLs, IADLs).    Time 12    Period Weeks    Status On-going      SLP LONG TERM GOAL #3   Title Pt will read paragraph-level materials relating to personal interests, financial and medical matters demonstrating comprehension of details >90% accuracy.    Time 12    Period Weeks    Status On-going              Plan - 09/01/21 1547     Clinical Impression Statement Laura [name] presents with mild anomic aphasia, mild dysarthria, and mild-moderate cognitive deficits s/p CVA. More successful with verbal/written sequencing when visual supports and images provided. I recommend skilled ST to address aphasia, dysarthria, and cognitive-communication deficits in order to improve ability to communicate wants and needs and increase pt independence, as pt goal is to return to living independently.    Speech Therapy Frequency 2x / week    Duration 12 weeks    Treatment/Interventions Language facilitation;Environmental controls;Cueing hierarchy;SLP instruction and feedback;Compensatory techniques;Cognitive reorganization;Functional tasks;Compensatory strategies;Internal/external aids;Multimodal communcation approach;Patient/family education    Potential to Achieve Goals Good    SLP Home  Exercise Plan TBD    Consulted and Agree with Plan of Care Patient;Family member/caregiver             Patient will benefit from skilled therapeutic intervention in order to improve the following deficits and impairments:   Aphasia  Cognitive communication deficit  Problem List Patient Active Problem List   Diagnosis Date Noted   Hyperlipidemia associated with type 2 diabetes mellitus ([place]) 06/02/2021   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 [place], [place], [address] Phone: [phone]   Fax:  [phone]   Name: Laura [name] MRN: [id number] Date of Birth: [birth year]

## 2021-09-01 NOTE — Therapy (Signed)
[place] MAIN [facility] SERVICES [address] [place], [place], [address] Phone: [phone]   Fax:  [phone]  Occupational Therapy Treatment  Patient Details  Name: Laura [name] MRN: [id number] Date of Birth: Jul 17, 1951 No data recorded  Encounter Date: 09/01/2021   OT End of Session - 09/01/21 1706     Visit Number 18    Number of Visits 24    Date for OT Re-Evaluation 10/04/21    Authorization Type Progress report starting 08/04/2021    OT Start Time 73    OT Stop Time 1345    OT Time Calculation (min) 45 min    Activity Tolerance Patient tolerated treatment well    Behavior During Therapy [facility] for tasks assessed/performed             Past Medical History:  Diagnosis Date   Acute CVA (cerebrovascular accident) (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   Foot fracture, no surgery   TONSILLECTOMY      There were no vitals filed for this visit.   Subjective Assessment - 09/01/21 1705     Patient is accompanied by: Family member    Pertinent History Per chart, Laura [name] is a 70 y.o. female in relatively good health but no medical care for years who was admitted on 05/23/2021 to [facility] with garbled speech, right facial droop, vision changes and right upper committee weakness.  Per reports she started feeling weak on 06/14 but refused to come to the hospital.  MRI brain done revealing acute nonhemorrhagic infarct in left caudate head and anterior lentiform nucleus as well as chronic microvascular ischemia mildly advanced for age.  CTA head/neck was negative for AVM, aneurysm or stenosis.  She was noted to have low vitamin B12 level at  97 and was started on supplementation.  Stroke was felt to be due to small vessel disease and neurology recommended DAPT x3 weeks followed by aspirin alone.  She continued to be limited by weakness with dysarthria, tachycardia with activity, balance deficits as well as motor planning deficits with ADLs.  CIR was recommended due to functional decline from 05-26-2021 to 06-02-2021.    Patient Stated Goals Pt would like to go home and be as independent as possible    Currently in Pain? No/denies            OT TREATMENT     Therapeutic Exercise:   Pt. worked on the [employer] for 8 min. with constant monitoring of the BUEs. Pt. worked on changing, and alternating forward reverse position every 2 min. Rest breaks were required. Pt. performed 2# dumbbell ex. for bilateral shoulder flexion, abduction, 3# elbow flexion and extension,  forearm supination/pronation, wrist flexion/extension, and radial deviation. Pt. worked on these exercises while in standing in front of a mirror for visual cues to maintain symmetry, and alignment during the exercises.    Neuromuscular reeducation:   Pt. worked on [facility] skills grasping 1" thin sticks on the [employer] . Pt. worked on storing the objects in the palm, and translatory skills moving the items from the palm of the hand to the tip of the 2nd digit, and thumb. Pt. worked  on removing the pegs using bilateral alternating hand patterns.   Pt. required visual demonstration, and cues for [facility] movement patterns with alternating right, and left movements. Patient continues to make progress with upper extremity strength, motor control, hand function, and coordination skills. Pt. Used 2# hand weights for proximal strengthening, and 3# for distal strengthening with visual cues using a mirror. Pt. required verbal cues for form, and technique, as well as for forearm stabilization when working on the distal UE exercises. Patient continues to work on improving right upper extremity  strength, and fine motor coordination skills in preparation for improving hand function skills needed for engaging efficiently during ADLs and IADL tasks.                                OT Education - 09/01/21 1706     Education Details [facility]    Person(s) Educated Patient;Caregiver(s)    Methods Explanation;Verbal cues;Handout    Comprehension Verbalized understanding;Need further instruction;Verbal cues required                 OT Long Term Goals - 08/04/21 1321       OT LONG TERM GOAL #1   Title Pt will be independent with home exercise program.    Baseline 08/04/2021: Independent with [employer] HEP.    Time 12    Period Weeks    Status Partially Met    Target Date 10/04/21      OT LONG TERM GOAL #2   Title Pt will improve FOTO score to 73 or above to demonstrate a clinically relevant change to impact greater independence in necessary daily tasks.    Baseline 08/04/2021: 65 Eval score of 61    Time 12    Period Weeks    Status New    Target Date 10/04/21      OT LONG TERM GOAL #3   Title Pt will complete light meal prep with modified independence.    Baseline 08/04/2021: Pt. is now preparing lunch sandwiches  independently. pt. is able to use the microwave. Pt. requires assist from family    Time 12    Period Weeks    Status Partially Met    Target Date 10/04/21      OT LONG TERM GOAL #4   Title Pt will complete laundry tasks with modified independence    Baseline 08/04/2021: Pt. is able to wash, transfer, and fold laundry. Pt. has improved, however requires occ. assist with the laundry controls. Eval: requires assist from family    Time 12    Period Weeks    Status New    Target Date 10/04/21      OT LONG TERM GOAL #5   Title Pt will complete check writing with modified independence.    Baseline 08/04/2021: Pt. was able to independently complete, and has her sister review it. Eval:  difficulty    Time 6    Period Weeks    Status Achieved       OT LONG TERM GOAL #6   Title Pt will demonstrate handwriting with legibility 80% or greater to write demographics and signature  on important paper.    Baseline 08/04/2021: Writing 70-80% legibility. EvaL difficulty with poor legibility    Time 6    Period Weeks    Status On-going    Target Date 08/23/21      OT LONG TERM GOAL #7   Title Pt  will complete basic self care tasks with modified independence.    Baseline increased time and occasional assist    Time 6    Period Weeks    Status Achieved      OT LONG TERM GOAL #8   Title Pt to improve coordination by reduction of time on 9 hole peg test by 10 secs to assist with performing coordination tasks with writing, buttons, zippers.    Baseline 08/04/2021: 46 sec. Eval: right 52 secs    Time 12    Period Weeks    Status On-going    Target Date 10/04/21      OT LONG TERM GOAL  #9   TITLE Pt will improve right grip strength by 10# to open jars, containers with modified independence.    Baseline 08/04/2021: 39# Eval: right 28#    Time 12    Period Weeks    Status On-going    Target Date 10/04/21                   Plan - 09/01/21 1707     Clinical Impression Statement Pt. required visual demonstration, and cues for [facility] movement patterns with alternating right, and left movements. Patient continues to make progress with upper extremity strength, motor control, hand function, and coordination skills. Pt. Used 2# hand weights for proximal strengthening, and 3# for distal strengthening with visual cues using a mirror. Pt. required verbal cues for form, and technique, as well as for forearm stabilization when working on the distal UE exercises. Patient continues to work on improving right upper extremity strength, and fine motor coordination skills in preparation for improving hand function skills needed for engaging efficiently during ADLs and IADL tasks.   OT Occupational Profile and History Detailed Assessment- Review of  Records and additional review of physical, cognitive, psychosocial history related to current functional performance    Occupational performance deficits (Please refer to evaluation for details): ADL's;IADL's;Leisure    Body Structure / Function / Physical Skills ADL;Dexterity;Flexibility;Strength;Balance;Coordination;FMC;IADL;UE functional use;Mobility    Psychosocial Skills Environmental  Adaptations;Habits;Routines and Behaviors    Rehab Potential Good    Clinical Decision Making Limited treatment options, no task modification necessary    Comorbidities Affecting Occupational Performance: May have comorbidities impacting occupational performance    Modification or Assistance to Complete Evaluation  No modification of tasks or assist necessary to complete eval    OT Frequency 2x / week    OT Duration 12 weeks    OT Treatment/Interventions Self-care/ADL training;Cryotherapy;Therapeutic exercise;DME and/or AE instruction;Functional Mobility Training;Cognitive remediation/compensation;Balance training;Neuromuscular education;Manual Therapy;Splinting;Visual/perceptual remediation/compensation;Moist Heat;Contrast Bath;Therapeutic activities;Patient/family education    Consulted and Agree with Plan of Care Patient;Family member/caregiver             Patient will benefit from skilled therapeutic intervention in order to improve the following deficits and impairments:   Body Structure / Function / Physical Skills: ADL, Dexterity, Flexibility, Strength, Balance, Coordination, FMC, IADL, UE functional use, Mobility   Psychosocial Skills: Environmental  Adaptations, Habits, Routines and Behaviors   Visit Diagnosis: Muscle weakness (generalized)    Problem List Patient Active Problem List   Diagnosis Date Noted   Hyperlipidemia associated with type 2 diabetes mellitus ([place]) 06/02/2021   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) 06/02/2021   Weakness as late effect of  cerebrovascular accident (CVA) 06/02/2021   Vitamin D deficiency 06/02/2021   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

## 2021-09-01 NOTE — Therapy (Signed)
[facility] MAIN [facility] SERVICES [address] [place], [place], [address] Phone: [phone]   Fax:  [phone]  Physical Therapy Treatment  Patient Details  Name: Laura [name] MRN: [id number] Date of Birth: 02-25-1951 Referring Provider (PT): Carlis Abbott Drema Pry, MD   Encounter Date: 09/01/2021   PT End of Session - 09/01/21 1154     Visit Number 14    Number of Visits 25    Date for PT Re-Evaluation 09/27/21    Authorization Type Medicare (traditional)    Authorization Time Period 07/05/21-09/27/21    Progress Note Due on Visit 20    PT Start Time 1146    PT Stop Time 1230    PT Time Calculation (min) 44 min    Equipment Utilized During Treatment Gait belt    Activity Tolerance Patient tolerated treatment well    Behavior During Therapy [facility] for tasks assessed/performed             Past Medical History:  Diagnosis Date   Acute CVA (cerebrovascular accident) (HCC) 05/24/2021   Diabetes mellitus without complication (HCC) 05/23/2021   Hyperlipidemia 05/23/2021   Hypertension 05/23/2021   Left forearm fracture    in high school--was casted   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   Foot fracture, no surgery   TONSILLECTOMY      There were no vitals filed for this visit.   Subjective Assessment - 09/01/21 1151     Subjective Patient reports doing well; no balance concerns; Reports HEP is going well; She reports feeling like her balance and walking are better;    Patient is accompained by: Family member   sister   Pertinent History Pt presents to evaluation with her sister as pt has difficulty with speech. Pt reports she has not been using an AD. Pt confirms she was admitted to hospital d/t CVA on 05/23/2021. Pt sister reports they are unsure of the onset of the stroke. Pt's sister reports she checked on her sister  after she received a birthday card and noticed her sister's signature looked abnormal. She called the pt and reports her voice was slurred. When she checked on her she said she had R side facial droop, difficulty with R hand. Pt's sister reports pt initially declined going to the ED but agreed to go the following morning where she was diagnosed with a CVA. Once d/c from the hospital the pt went to rehab for a week. Pt was living alone, independently prior to her stroke, but now lives with her sister and her sister's husband. Pt's sister says her husband walks with pt 15 mins 2x/day. Pt's sister reports after pt has been walking for a while she will start to drag her L leg. She says this has improved with time. Pt now requires some assistance with ADLs, including dressing, making her bed, and self-feeding, showering. Pt's sister reports pt now has vision issues as well as balance problems. She reports pt loses balance with dressing herself. Other PMH includes: HTN, DMII, Vitamin b12 deficiency, HLD, Vitamin D deficiency. Pt also with dysphasia, aphasia, weakness d/t recent stroke.    Limitations Walking;Writing;Standing;Lifting;House hold activities;Reading    How long can you sit comfortably? not affected    How long can you stand comfortably? 15 min    How long can you walk comfortably? 15 minutes  Diagnostic tests per chart MRI brain revealed acute nonhemorrhagic infarct in L caudate had and anterior lentiform nucelus and chronic microvascular   ischemia mildly advanced for age. CTA head neck negative for AVM, aneurysms or stenosis. 2D echo showing EF 65 to 70% with borderline LVH.    Patient Stated Goals Pt would like to go home again    Currently in Pain? No/denies    Multiple Pain Sites No                   INTERVENTIONS:  Standing calf stretch heel off step stretch 30 sec hold x2 rep each LE; Required mod VCs for proper positioning;    Neuromuscular Re-ed:    Static stand on wood  incline with airex pad: Eyes open  x 30 sec x 2, eyes closed 15 sec hold x2 reps with increased instability noted with eyes closed             Progressed with feet staggered    Eyes open 10 sec, eyes closed 10 sec with immediate posterior loss of balance with eyes closed; Required cues to utilize hip strategies and trunk control to correct loss of balance, able to exhibit better stance control with subsequent repetition;      Forward step up/down on  airex unsupported x10 reps with looking forward and turning head to side, no instability; close supervision good safety awareness;   Standing on airex unsupported: Feet together: throwing darts at dartboard #5 Tandem stance throwing darts at dartboard #5 each foot in front SLS with darts #2 each LE , requiring Min A for balance with difficulty holding SLS noted;  Patient required CGA to min A with advanced balance task, having difficulty standing with narrow base of support unsupported. She also exhibits decreased coordination with decreased accuracy of darts when working harder on her balance;  Instructed patient in dynamic balance tasks: Forward/backward diagonal shuffle/skip between stepping stones #6 Progressed to shuffle with hand tap to stone x1 lap each Progressed to shuffle with hand tap to stone while counting ahead by 5's Patient was able to exhibit better shuffle/skip with subsequent repetitions. She had a hard time with going backwards requiring CGA for safety and cues for foot positioning. She also exhibits slower speed when counting;    Education provided throughout session via VC/TC and demonstration to facilitate movement at target joints and correct muscle activation for all testing and exercises performed.    Patient did require cues for neutral weight shift for better stance control especially while on compliant surfaces. She reports mild fatigue at end of session.                              PT Education  - 09/01/21 1154     Education Details balance/safety;    Person(s) Educated Patient    Methods Explanation;Verbal cues    Comprehension Need further instruction;Verbalized understanding;Returned demonstration;Verbal cues required              PT Short Term Goals - 08/12/21 1402       PT SHORT TERM GOAL #1   Title Patient will be independent in home exercise program to improve strength/mobility for better functional independence with ADLs.    Baseline 07/05/2021: to be initiated next visit; 9/8: has been holding off due to medication side effect;    Time 6    Status On-going    Target Date 08/16/21  PT Long Term Goals - 08/12/21 1358       PT LONG TERM GOAL #1   Title Patient will increase FOTO score to equal to or greater than 79 to demonstrate statistically significant improvement in mobility and quality of life.    Baseline 8/1: 70    Time 12    Period Weeks    Status On-going    Target Date 09/27/21      PT LONG TERM GOAL #2   Title Patient will increase Berg Balance score by > 6 points to demonstrate decreased fall risk during functional activities.    Baseline 8/1: 46/56    Time 12    Period Weeks    Status On-going    Target Date 09/27/21      PT LONG TERM GOAL #3   Title The pt will ambulate at least 1700 ft during to demonstrate return toward age norm gait ability.    Baseline 8/1: 1398 ft no AD, CGA; 9/8: 1460 feet;    Time 12    Period Weeks    Status On-going    Target Date 09/27/21      PT LONG TERM GOAL #4   Title Pt to demonstrate improvement in Mini-BesTest score to 22/28 or higher to represent improved balance and reduced falls risk.    Baseline On 07/22/21: 15/28, 9/8: 18/28    Time 8    Period Weeks    Status New                   Plan - 09/01/21 1324     Clinical Impression Statement Patient motivated and participated well within session. She continues to have difficulty with standing on airex/compliant  surface especially with eyes closed. Patient required intermittent min A and cues for hip/ankle strategies for better balance recovery to avoid posterior loss of balance. She was instructed in dynamic tasks to challenge motor control/planning and coordination. Patient did have significant difficulty skipping/shuffling feet. She reports mild fatigue at end of session. She would benefit from additional skilled PT Intervention to address high level balance deficits and incoordination.    Personal Factors and Comorbidities Comorbidity 1;Comorbidity 2;Comorbidity 3+;Sex;Age    Comorbidities HTN, DMII, dysphasia, aphasia, weakness,    Examination-Activity Limitations Locomotion Level;Stairs;Dressing;Bathing;Reach Overhead;Lift;Caring for Others    Examination-Participation Restrictions Driving;Medication Management;Personal Finances;Cleaning;Meal Prep;Yard Work;Community Activity;Shop;Other;Laundry    Stability/Clinical Decision Making Evolving/Moderate complexity    Rehab Potential Good    PT Frequency 2x / week    PT Duration 12 weeks    PT Treatment/Interventions ADLs/Self Care Home Management;Biofeedback;Canalith Repostioning;Aquatic Therapy;Cryotherapy;Moist Heat;DME Instruction;Gait training;Stair training;Functional mobility training;Therapeutic activities;Therapeutic exercise;Balance training;Neuromuscular re-education;Cognitive remediation;Patient/family education;Orthotic Fit/Training;Manual techniques;Passive range of motion;Energy conservation;Taping;Splinting;Vestibular;Visual/perceptual remediation/compensation;Joint Manipulations    PT Next Visit Plan strength, endurance, balance and gait interventions    PT Home Exercise Plan No changes to HEP this visit    Consulted and Agree with Plan of Care Patient;Family member/caregiver    Family Member Consulted sister             Patient will benefit from skilled therapeutic intervention in order to improve the following deficits and  impairments:  Abnormal gait, Decreased activity tolerance, Decreased endurance, Decreased range of motion, Decreased strength, Hypomobility, Improper body mechanics, Decreased balance, Decreased coordination, Decreased mobility, Difficulty walking, Impaired flexibility, Postural dysfunction, Impaired vision/preception, Impaired sensation  Visit Diagnosis: Muscle weakness (generalized)  Other lack of coordination  Abnormality of gait and mobility  Difficulty in walking, not elsewhere classified  Unsteadiness on  feet     Problem List 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) 06/02/2021   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 (CVA) 06/02/2021   Vitamin D deficiency 06/02/2021   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

## 2021-09-02 ENCOUNTER — Ambulatory Visit
Admission: RE | Admit: 2021-09-02 | Discharge: 2021-09-02 | Disposition: A | Discharge status: Home / Self Care | Payer: Medicare Other | Source: Ambulatory Visit | Attending: Internal Medicine | Admitting: Internal Medicine

## 2021-09-02 DIAGNOSIS — Z780 Asymptomatic menopausal state: Secondary | ICD-10-CM | POA: Insufficient documentation

## 2021-09-03 ENCOUNTER — Ambulatory Visit: Payer: Medicare Other | Admitting: Physical Therapy

## 2021-09-03 ENCOUNTER — Other Ambulatory Visit: Payer: Self-pay

## 2021-09-03 DIAGNOSIS — R262 Difficulty in walking, not elsewhere classified: Secondary | ICD-10-CM

## 2021-09-03 DIAGNOSIS — M6281 Muscle weakness (generalized): Secondary | ICD-10-CM

## 2021-09-03 DIAGNOSIS — R278 Other lack of coordination: Secondary | ICD-10-CM

## 2021-09-03 DIAGNOSIS — R269 Unspecified abnormalities of gait and mobility: Secondary | ICD-10-CM | POA: Diagnosis not present

## 2021-09-03 NOTE — Therapy (Signed)
[facility] MAIN [facility] SERVICES [address] [place], [place], [address] Phone: [phone]   Fax:  [phone]  Physical Therapy Treatment  Patient Details  Name: Laura [name] MRN: [id number] Date of Birth: 05-02-1951 Referring Provider (PT): Carlis Abbott Drema Pry, MD   Encounter Date: 09/03/2021   PT End of Session - 09/03/21 1110     Visit Number 15    Number of Visits 25    Date for PT Re-Evaluation 09/27/21    Authorization Type Medicare (traditional)    Authorization Time Period 07/05/21-09/27/21    Progress Note Due on Visit 20    PT Start Time 1000    PT Stop Time 1045    PT Time Calculation (min) 45 min    Equipment Utilized During Treatment Gait belt    Activity Tolerance Patient tolerated treatment well    Behavior During Therapy [facility] for tasks assessed/performed             Past Medical History:  Diagnosis Date   Acute CVA (cerebrovascular accident) (HCC) 05/24/2021   Diabetes mellitus without complication (HCC) 05/23/2021   Hyperlipidemia 05/23/2021   Hypertension 05/23/2021   Left forearm fracture    in high school--was casted   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   Foot fracture, no surgery   TONSILLECTOMY      There were no vitals filed for this visit.   Subjective Assessment - 09/03/21 1003     Subjective Pt reports she is doing well. Pt had bone density exam yesterday and has one area that is mildly osteopenic in her wrist from previuos wrist fracture. Pt reports no falls or LOB since last physicaly therapy session.    Patient is accompained by: Family member    Pertinent History Pt presents to evaluation with her sister as pt has difficulty with speech. Pt reports she has not been using an AD. Pt confirms she was admitted to hospital d/t CVA on 05/23/2021. Pt sister reports they are unsure of  the onset of the stroke. Pt's sister reports she checked on her sister after she received a birthday card and noticed her sister's signature looked abnormal. She called the pt and reports her voice was slurred. When she checked on her she said she had R side facial droop, difficulty with R hand. Pt's sister reports pt initially declined going to the ED but agreed to go the following morning where she was diagnosed with a CVA. Once d/c from the hospital the pt went to rehab for a week. Pt was living alone, independently prior to her stroke, but now lives with her sister and her sister's husband. Pt's sister says her husband walks with pt 15 mins 2x/day. Pt's sister reports after pt has been walking for a while she will start to drag her L leg. She says this has improved with time. Pt now requires some assistance with ADLs, including dressing, making her bed, and self-feeding, showering. Pt's sister reports pt now has vision issues as well as balance problems. She reports pt loses balance with dressing herself. Other PMH includes: HTN, DMII, Vitamin b12 deficiency, HLD, Vitamin D deficiency. Pt also with dysphasia, aphasia, weakness d/t recent stroke.    Limitations Walking;Writing;Standing;Lifting;House hold activities;Reading    How long can you sit comfortably? not affected    How long can you stand  comfortably? 15 min    How long can you walk comfortably? 15 minutes    Diagnostic tests per chart MRI brain revealed acute nonhemorrhagic infarct in L caudate had and anterior lentiform nucelus and chronic microvascular   ischemia mildly advanced for age. CTA head neck negative for AVM, aneurysms or stenosis. 2D echo showing EF 65 to 70% with borderline LVH.    Patient Stated Goals Pt would like to go home again    Currently in Pain? No/denies              Treatment provided this session  Therex:          Neuro Re- Ed:   HR on 1/2 foam roller 2 x 10 with UE assist -for improved ankle ROM  as well improving balance with narrowing BOS   Balance course consisting of walking on red mat with objects underneath, wide BOS walking over 1/2 foam roller, narrow BOS walking through hedgehogs, 1 step over airex pad, 2 x step ups to 6 in and 4 in step, airex balance beam tandem walk 2 x through, 2 x through while carrying yellow weighted ball -most difficulty reported with airex balance beam ambulation  -with minor LOB episodes on airex balance beam pt corrected with stepping strategy while maintaining contact with balance beam   Rocker board standing balance x 1 minute each repetition , goal to maintain rocker in neutral position  A/P rocker orientation: 1 rep stable, 1 rep sup/ inf head turns, 1 rep lateral head turns -good ankle streategy utilization  Lateral rocker oriantaion x 1 minute each repetition , goal to maintain rocker in neutral position  -1 rep stable, 1 rep sup/ inf head turns, 1 rep lateral head turns -good ankle streategy utilization -no LOB   Airex pad eyes closed 3 x 30 sec holds, feet together,  no LOB noted, good utilization of ankle strategies to maintain balance, decreased sway with each repetition   Tandem stance with dart throwing x 1 repetition on ea LE ( about 10 darts/ balls)  -increased difficulty utilizing R LE posterior and had to utilize semi-tandem stance in order to complete -challenging motor dual task of maintaining balance with tossing ball as well as cognitive dual task of counting score as she threw dart balls .       Unless otherwise stated, SBA was provided and gait belt donned in order to ensure pt safety  Pt educated throughout session about proper posture and technique with exercises. Improved exercise technique, movement at target joints, use of target muscles after min to mod verbal, visual, tactile cues.   Note: Portions of this document were prepared using Dragon voice recognition software and although reviewed may contain  unintentional dictation errors in syntax, grammar, or spelling.                              PT Education - 09/03/21 1108     Education Details Exercise form and technique, progress with therapy thus far    Person(s) Educated Patient    Methods Explanation;Verbal cues    Comprehension Verbalized understanding              PT Short Term Goals - 08/12/21 1402       PT SHORT TERM GOAL #1   Title Patient will be independent in home exercise program to improve strength/mobility for better functional independence with ADLs.    Baseline 07/05/2021: to be  initiated next visit; 9/8: has been holding off due to medication side effect;    Time 6    Status On-going    Target Date 08/16/21               PT Long Term Goals - 08/12/21 1358       PT LONG TERM GOAL #1   Title Patient will increase FOTO score to equal to or greater than 79 to demonstrate statistically significant improvement in mobility and quality of life.    Baseline 8/1: 70    Time 12    Period Weeks    Status On-going    Target Date 09/27/21      PT LONG TERM GOAL #2   Title Patient will increase Berg Balance score by > 6 points to demonstrate decreased fall risk during functional activities.    Baseline 8/1: 46/56    Time 12    Period Weeks    Status On-going    Target Date 09/27/21      PT LONG TERM GOAL #3   Title The pt will ambulate at least 1700 ft during to demonstrate return toward age norm gait ability.    Baseline 8/1: 1398 ft no AD, CGA; 9/8: 1460 feet;    Time 12    Period Weeks    Status On-going    Target Date 09/27/21      PT LONG TERM GOAL #4   Title Pt to demonstrate improvement in Mini-BesTest score to 22/28 or higher to represent improved balance and reduced falls risk.    Baseline On 07/22/21: 15/28, 9/8: 18/28    Time 8    Period Weeks    Status New                   Plan - 09/03/21 1115     Clinical Impression Statement Patient  continues to be motivated and participates well within session.  Patient continues to have difficulty with standing on various surfaces that are unstable when combined with tasks that impair her vision particularly with head turns or with eyes closed, although she did demonstrate improved ability to stand with eyes closed on the Airex pad during today's session.  Patient demonstrated good anticipatory and reactive balance responses when navigating obstacle course as described in note today.  Patient had most difficulty with tandem walking on Airex balance beam but was able to navigate it without loss of balance.  Patient has difficulty with maintaining tandem balance with increased difficulty with the right lower extremity posterior and primary stance lower extremity.  Patient was challenged with dual task consisting of tandem balance throwing darts as well as counting score with a darts demonstrate good efficacy with this task as well.  Patient will continue to benefit from skilled physical therapy intervention to address high-level balance deficits, and improve overall safety within the home and community.    Personal Factors and Comorbidities Comorbidity 1;Comorbidity 2;Comorbidity 3+;Sex;Age    Comorbidities HTN, DMII, dysphasia, aphasia, weakness,    Examination-Activity Limitations Locomotion Level;Stairs;Dressing;Bathing;Reach Overhead;Lift;Caring for Others    Examination-Participation Restrictions Driving;Medication Management;Personal Finances;Cleaning;Meal Prep;Yard Work;Community Activity;Shop;Other;Laundry    Stability/Clinical Decision Making Evolving/Moderate complexity    Rehab Potential Good    PT Frequency 2x / week    PT Duration 12 weeks    PT Treatment/Interventions ADLs/Self Care Home Management;Biofeedback;Canalith Repostioning;Aquatic Therapy;Cryotherapy;Moist Heat;DME Instruction;Gait training;Stair training;Functional mobility training;Therapeutic activities;Therapeutic  exercise;Balance training;Neuromuscular re-education;Cognitive remediation;Patient/family education;Orthotic Fit/Training;Manual techniques;Passive range of motion;Energy conservation;Taping;Splinting;Vestibular;Visual/perceptual remediation/compensation;Joint Manipulations  PT Next Visit Plan strength, endurance, balance and gait interventions    PT Home Exercise Plan No changes to HEP this visit    Consulted and Agree with Plan of Care Patient;Family member/caregiver    Family Member Consulted sister             Patient will benefit from skilled therapeutic intervention in order to improve the following deficits and impairments:  Abnormal gait, Decreased activity tolerance, Decreased endurance, Decreased range of motion, Decreased strength, Hypomobility, Improper body mechanics, Decreased balance, Decreased coordination, Decreased mobility, Difficulty walking, Impaired flexibility, Postural dysfunction, Impaired vision/preception, Impaired sensation  Visit Diagnosis: Muscle weakness (generalized)  Other lack of coordination  Abnormality of gait and mobility  Difficulty in walking, not elsewhere classified     Problem List 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) 06/02/2021   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 (CVA) 06/02/2021   Vitamin D deficiency 06/02/2021   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

## 2021-09-06 ENCOUNTER — Encounter: Payer: Self-pay | Admitting: Neurology

## 2021-09-06 ENCOUNTER — Ambulatory Visit (INDEPENDENT_AMBULATORY_CARE_PROVIDER_SITE_OTHER): Payer: Medicare Other | Admitting: Neurology

## 2021-09-06 VITALS — BP 113/60 | HR 71 | Ht 68.0 in | Wt 147.0 lb

## 2021-09-06 DIAGNOSIS — I639 Cerebral infarction, unspecified: Secondary | ICD-10-CM | POA: Insufficient documentation

## 2021-09-06 DIAGNOSIS — I6521 Occlusion and stenosis of right carotid artery: Secondary | ICD-10-CM | POA: Diagnosis not present

## 2021-09-06 HISTORY — DX: Cerebral infarction, unspecified: I63.9

## 2021-09-06 NOTE — Progress Notes (Signed)
Chief Complaint  Patient presents with   New Patient (Initial Visit)    Rm 13, with sister Laura [name], states she is doing well, neuro clearance needed for surgery       ASSESSMENT AND PLAN  Laura [name] is a 70 y.o. female   Stroke in June 2022  Involving left caudate head, anterior lentiform nucleus,  Vascular risk factor of newly diagnosed diabetes hypertension hyperlipidemia  On aspirin 81 mg daily  Right internal carotid artery stenosis  60% stenosis of internal carotid artery  Ultrasound of carotid artery, will call result,  If there was no severe stenosis, will proceed with cataract surgery   DIAGNOSTIC DATA (LABS, IMAGING, TESTING) - I reviewed patient records, labs, notes, testing and imaging myself where available.   MEDICAL HISTORY:  Laura [name], is a 70 year old female, seen in request by her primary care physician Dr. Sharren Bridge, Vernona Rieger evaluation of stroke, presurgical clearance, she is accompanied by her Sister Laura [name] at today's visit September 06, 2021  I reviewed and summarized the referring note. PMHx. DM  HLD HTN  She retired from job of post office at age 76, lives alone, has not seen a doctor for many years, on May 22, 2021, her Sister Laura [name] received a postcard, realized her handwriting has changed much, when she checked on patient, she was noted to have slurred speech, right arm and leg weakness, she was able to persuade patient to go to hospital on May 23, 2021  I personally reviewed MRI of the brain acute stroke involving left caudate head and anterior lentiform nuclei, periventricular small vessel disease  MR angiogram of the neck showed right internal carotid artery 60% stenosis,  MRA of brain showed no significant large vessel disease  She was not on any medications prior to hospital admission, laboratory showed A1c 12, elevated blood pressure 146/85, LDL 124, triglyceride 203,  She was put on aspirin 81 mg daily, Norvasc, Lipitor,  metformin  Went to rehab, she had regained significant improvement, only mild slurred speech, improved gait, right hand clumsiness  She is planning on to have bilateral cataract surgery soon, needs presurgical clearance, reported rapid right side visual loss, also has right-sided hearing loss    PHYSICAL EXAM:   Vitals:   09/06/21 1418  BP: 113/60  Pulse: 71  Weight: 147 lb (66.7 kg)  Height: 5\' 8"  (1.727 m)   Not recorded     Body mass index is 22.35 kg/m.  PHYSICAL EXAMNIATION:  Gen: NAD, conversant, well nourised, well groomed                     Cardiovascular: Regular rate rhythm, no peripheral edema, warm, nontender. Eyes: Conjunctivae clear without exudates or hemorrhage Neck: Supple, no carotid bruits. Pulmonary: Clear to auscultation bilaterally   NEUROLOGICAL EXAM:  MENTAL STATUS: Speech: Dysarthria, normal comprehension Cognition:     Orientation to time, place and person     Normal recent and remote memory     Normal Attention span and concentration     Normal Language, naming, repeating,spontaneous speech     Fund of knowledge   CRANIAL NERVES: CN II: Visual fields are full to confrontation. Pupils are round equal and briskly reactive to light. CN III, IV, VI: extraocular movement are normal. No ptosis. CN V: Facial sensation is intact to light touch CN VII: Right lower face weakness CN VIII: Hard of hearing CN IX, X: Mild slurred speech CN XI: Head turning and shoulder shrug are intact  MOTOR: Right arm pronation, fixation of right arm on rapid rotating movement, mild right leg drift  REFLEXES: Hyperreflexia of right upper and lower extremity  SENSORY: Intact to light touch, pinprick and vibratory sensation are intact in fingers and toes.  COORDINATION: There is no trunk or limb dysmetria noted.  GAIT/STANCE: Steady, dragging right leg some  REVIEW OF SYSTEMS:  Full 14 system review of systems performed and notable only for as above All  other review of systems were negative.   ALLERGIES: No Known Allergies  HOME MEDICATIONS: Current Outpatient Medications  Medication Sig Dispense Refill   Accu-Chek Softclix Lancets lancets Use twice a day 100 each 12   amLODipine (NORVASC) 5 MG tablet Take 1 tablet (5 mg total) by mouth daily. 90 tablet 1   aspirin 81 MG EC tablet Take 1 tablet (81 mg total) by mouth daily. Swallow whole. 30 tablet 11   atorvastatin (LIPITOR) 80 MG tablet Take 1 tablet (80 mg total) by mouth every evening. 90 tablet 1   cyanocobalamin 1000 MCG tablet Take 1 tablet (1,000 mcg total) by mouth daily. 90 tablet 1   glucose blood (ACCU-CHEK GUIDE) test strip Test blood sugar twice a day 100 each 12   Insulin Glargine (BASAGLAR KWIKPEN) 100 UNIT/ML Inject 24 Units into the skin daily. 15 mL 1   Insulin Pen Needle (COMFORT TOUCH INSULIN PEN NEED) 32G X 6 MM MISC 1 application by Does not apply route daily. 100 each 0   metFORMIN (GLUCOPHAGE-XR) 500 MG 24 hr tablet Take 1 tablet (500 mg total) by mouth in the morning and at bedtime. 180 tablet 1   polyethylene glycol (MIRALAX / GLYCOLAX) 17 g packet Take 17 g by mouth daily as needed for moderate constipation. 14 each 0   Vitamin D, Ergocalciferol, (DRISDOL) 1.25 MG (50000 UNIT) CAPS capsule Take 1 capsule (50,000 Units total) by mouth every 7 (seven) days. 12 capsule 1   No current facility-administered medications for this visit.    PAST MEDICAL HISTORY: Past Medical History:  Diagnosis Date   Acute CVA (cerebrovascular accident) (HCC) 05/24/2021   Diabetes mellitus without complication (HCC) 05/23/2021   Hyperlipidemia 05/23/2021   Hypertension 05/23/2021   Left forearm fracture    in high school--was casted   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   Stroke (HCC) 05/23/2021    PAST SURGICAL HISTORY: Past Surgical History:  Procedure Laterality Date   FOOT SURGERY     FRACTURE SURGERY  ?   Foot fracture, no surgery   TONSILLECTOMY      FAMILY  HISTORY: Family History  Problem Relation Age of Onset   Hypertension Mother    Diabetes Mother    Arthritis Mother    Heart disease Mother    Heart failure Father    Stroke Father    COPD Father    Hypertension Sister    Arthritis Sister    Heart disease Maternal Grandfather    Heart disease Maternal Grandmother    Stroke Paternal Grandfather    Stroke Paternal Grandmother    Arthritis Sister    Hypertension Sister    Cancer Maternal Aunt    Diabetes Maternal Aunt    Cancer Paternal Aunt    Hypertension Sister    Varicose Veins Sister     SOCIAL HISTORY: Social History   Socioeconomic History   Marital status: Single    Spouse name: Not on file   Number of children: Not on file   Years of education: Not on file  Highest education level: Not on file  Occupational History   Not on file  Tobacco Use   Smoking status: Never    Passive exposure: Never   Smokeless tobacco: Never  Vaping Use   Vaping Use: Never used  Substance and Sexual Activity   Alcohol use: Never   Drug use: Never   Sexual activity: Not Currently    Birth control/protection: None  Other Topics Concern   Not on file  Social History Narrative   Not on file   Social Determinants of Health   Financial Resource Strain: Low Risk    Difficulty of Paying Living Expenses: Not hard at all  Food Insecurity: No Food Insecurity   Worried About Programme researcher, broadcasting/film/video in the Last Year: Never true   Ran Out of Food in the Last Year: Never true  Transportation Needs: No Transportation Needs   Lack of Transportation (Medical): No   Lack of Transportation (Non-Medical): No  Physical Activity: Insufficiently Active   Days of Exercise per Week: 4 days   Minutes of Exercise per Session: 30 min  Stress: No Stress Concern Present   Feeling of Stress : Not at all  Social Connections: Socially Isolated   Frequency of Communication with Friends and Family: Never   Frequency of Social Gatherings with Friends and  Family: Never   Attends Religious Services: Never   Database administrator or Organizations: No   Attends Engineer, structural: Never   Marital Status: Never married  Catering manager Violence: Not At Risk   Fear of Current or Ex-Partner: No   Emotionally Abused: No   Physically Abused: No   Sexually Abused: No      Levert Feinstein, M.D. Ph.D.  [facility] [address] [place], [place] [address] Ph: [phone] Fax: [phone]  CC:  Reubin Milan, MD [address] [place],  [place] [address]  Reubin Milan, MD

## 2021-09-07 ENCOUNTER — Ambulatory Visit: Payer: Medicare Other

## 2021-09-07 ENCOUNTER — Ambulatory Visit: Payer: Medicare Other | Attending: Physician Assistant

## 2021-09-07 ENCOUNTER — Other Ambulatory Visit: Payer: Self-pay

## 2021-09-07 ENCOUNTER — Ambulatory Visit: Payer: Medicare Other | Admitting: Speech Pathology

## 2021-09-07 DIAGNOSIS — R2681 Unsteadiness on feet: Secondary | ICD-10-CM

## 2021-09-07 DIAGNOSIS — R278 Other lack of coordination: Secondary | ICD-10-CM | POA: Diagnosis present

## 2021-09-07 DIAGNOSIS — R471 Dysarthria and anarthria: Secondary | ICD-10-CM | POA: Insufficient documentation

## 2021-09-07 DIAGNOSIS — R4701 Aphasia: Secondary | ICD-10-CM | POA: Insufficient documentation

## 2021-09-07 DIAGNOSIS — R41841 Cognitive communication deficit: Secondary | ICD-10-CM | POA: Diagnosis present

## 2021-09-07 DIAGNOSIS — R269 Unspecified abnormalities of gait and mobility: Secondary | ICD-10-CM | POA: Diagnosis present

## 2021-09-07 DIAGNOSIS — M6281 Muscle weakness (generalized): Secondary | ICD-10-CM

## 2021-09-07 DIAGNOSIS — R2689 Other abnormalities of gait and mobility: Secondary | ICD-10-CM | POA: Diagnosis present

## 2021-09-07 DIAGNOSIS — I6300 Cerebral infarction due to thrombosis of unspecified precerebral artery: Secondary | ICD-10-CM | POA: Diagnosis present

## 2021-09-07 DIAGNOSIS — R262 Difficulty in walking, not elsewhere classified: Secondary | ICD-10-CM

## 2021-09-07 NOTE — Therapy (Signed)
[facility] MAIN [facility] SERVICES [address] [place], [place], [address] Phone: [phone]   Fax:  [phone]  Physical Therapy Treatment  Patient Details  Name: Laura [name] MRN: [id number] Date of Birth: 1950/12/19 Referring Provider (PT): Carlis Abbott Drema Pry, MD   Encounter Date: 09/07/2021   PT End of Session - 09/07/21 1733     Visit Number 16    Number of Visits 25    Date for PT Re-Evaluation 09/27/21    Authorization Type Medicare (traditional)    Authorization Time Period 07/05/21-09/27/21    Progress Note Due on Visit 20    PT Start Time 1100    PT Stop Time 1144    PT Time Calculation (min) 44 min    Equipment Utilized During Treatment Gait belt    Activity Tolerance Patient tolerated treatment well    Behavior During Therapy [place] [facility] for tasks assessed/performed             Past Medical History:  Diagnosis Date   Acute CVA (cerebrovascular accident) (HCC) 05/24/2021   Diabetes mellitus without complication (HCC) 05/23/2021   Hyperlipidemia 05/23/2021   Hypertension 05/23/2021   Left forearm fracture    in high school--was casted   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   Foot fracture, no surgery   TONSILLECTOMY      There were no vitals filed for this visit.   Subjective Assessment - 09/07/21 1107     Subjective Patient reports having a good follow up with MD. She continues to deny any falls or LOB. Reports has not been outside much due to bad weather over the weekend.    Patient is accompained by: Family member    Pertinent History Pt presents to evaluation with her sister as pt has difficulty with speech. Pt reports she has not been using an AD. Pt confirms she was admitted to hospital d/t CVA on 05/23/2021. Pt sister reports they are unsure of the onset of the stroke. Pt's sister reports she checked  on her sister after she received a birthday card and noticed her sister's signature looked abnormal. She called the pt and reports her voice was slurred. When she checked on her she said she had R side facial droop, difficulty with R hand. Pt's sister reports pt initially declined going to the ED but agreed to go the following morning where she was diagnosed with a CVA. Once d/c from the hospital the pt went to rehab for a week. Pt was living alone, independently prior to her stroke, but now lives with her sister and her sister's husband. Pt's sister says her husband walks with pt 15 mins 2x/day. Pt's sister reports after pt has been walking for a while she will start to drag her L leg. She says this has improved with time. Pt now requires some assistance with ADLs, including dressing, making her bed, and self-feeding, showering. Pt's sister reports pt now has vision issues as well as balance problems. She reports pt loses balance with dressing herself. Other PMH includes: HTN, DMII, Vitamin b12 deficiency, HLD, Vitamin D deficiency. Pt also with dysphasia, aphasia, weakness d/t recent stroke.    Limitations Walking;Writing;Standing;Lifting;House hold activities;Reading    How long can you sit comfortably? not affected    How long can you stand comfortably? 15 min    How long  can you walk comfortably? 15 minutes    Diagnostic tests per chart MRI brain revealed acute nonhemorrhagic infarct in L caudate had and anterior lentiform nucelus and chronic microvascular   ischemia mildly advanced for age. CTA head neck negative for AVM, aneurysms or stenosis. 2D echo showing EF 65 to 70% with borderline LVH.    Patient Stated Goals Pt would like to go home again    Currently in Pain? No/denies                Interventions:   Step training:   Ascending/Descending 15+ steps using 1 rail with supervision- Patient able to demo going up with reciprocal step and then going down with step to gait and no LOB or  reported difficulty.   Neuromuscular re-education:   Static stand on blue airex pad- 30 sec (mild sway)   Dynamic marching on pad- Difficult with maintaining balance requiring intermittent UE support.   Horizontal head turn (left to right ) EO then EC x 5 each- mild increased difficulty with eyes closed.   360 turning while maintaining on blue airex pad x 2 trials each direction.   Attempted single leg stance while kicking soccer ball around stance foot - intially with BUE support then progressed to 1 UE yet unable to perform without UE support today.   Side stepping on 1/2 foam roll- Initially start with B feet (midfoot) on foam in the middle portion then side step to right back to middle then to left back to middle x 10 rep each.   Tandem gait - walking on 2 rolls (1/2 foam roll) beside support bar.  X 4 trials down and back.   Tandem stance on 1/2 foam roll - hold up to 20 sec x 3 each leg.     Education provided throughout session via VC/TC and demonstration to facilitate movement at target joints and correct muscle activation for all testing and exercises performed.   Clinical impression: Patient challenged with tandem standing and with single leg balance activities. She continues to present as visually preferenced and demo appropriate ankle righting reactions with tandem stand/walk today. She also preformed well with negotiating steps without issues or concerns today. Patient will continue to benefit from skilled physical therapy intervention to address high-level balance deficits, and improve overall safety within the home and community.                  PT Education - 09/07/21 1108     Education Details Exercise form/technique    Person(s) Educated Patient    Methods Explanation;Demonstration;Tactile cues;Verbal cues    Comprehension Verbalized understanding;Returned demonstration;Verbal cues required;Need further instruction;Tactile cues required               PT Short Term Goals - 08/12/21 1402       PT SHORT TERM GOAL #1   Title Patient will be independent in home exercise program to improve strength/mobility for better functional independence with ADLs.    Baseline 07/05/2021: to be initiated next visit; 9/8: has been holding off due to medication side effect;    Time 6    Status On-going    Target Date 08/16/21               PT Long Term Goals - 08/12/21 1358       PT LONG TERM GOAL #1   Title Patient will increase FOTO score to equal to or greater than 79 to demonstrate statistically significant improvement in mobility and  quality of life.    Baseline 8/1: 70    Time 12    Period Weeks    Status On-going    Target Date 09/27/21      PT LONG TERM GOAL #2   Title Patient will increase Berg Balance score by > 6 points to demonstrate decreased fall risk during functional activities.    Baseline 8/1: 46/56    Time 12    Period Weeks    Status On-going    Target Date 09/27/21      PT LONG TERM GOAL #3   Title The pt will ambulate at least 1700 ft during to demonstrate return toward age norm gait ability.    Baseline 8/1: 1398 ft no AD, CGA; 9/8: 1460 feet;    Time 12    Period Weeks    Status On-going    Target Date 09/27/21      PT LONG TERM GOAL #4   Title Pt to demonstrate improvement in Mini-BesTest score to 22/28 or higher to represent improved balance and reduced falls risk.    Baseline On 07/22/21: 15/28, 9/8: 18/28    Time 8    Period Weeks    Status New                   Plan - 09/07/21 1110     Clinical Impression Statement Patient challenged with tandem standing and with single leg balance activities. She continues to present as visually preferenced and demo appropriate ankle righting reactions with tandem stand/walk today. She also preformed well with negotiating steps without issues or concerns today. Patient will continue to benefit from skilled physical therapy intervention to address  high-level balance deficits, and improve overall safety within the home and community.    Personal Factors and Comorbidities Comorbidity 1;Comorbidity 2;Comorbidity 3+;Sex;Age    Comorbidities HTN, DMII, dysphasia, aphasia, weakness,    Examination-Activity Limitations Locomotion Level;Stairs;Dressing;Bathing;Reach Overhead;Lift;Caring for Others    Examination-Participation Restrictions Driving;Medication Management;Personal Finances;Cleaning;Meal Prep;Yard Work;Community Activity;Shop;Other;Laundry    Stability/Clinical Decision Making Evolving/Moderate complexity    Rehab Potential Good    PT Frequency 2x / week    PT Duration 12 weeks    PT Treatment/Interventions ADLs/Self Care Home Management;Biofeedback;Canalith Repostioning;Aquatic Therapy;Cryotherapy;Moist Heat;DME Instruction;Gait training;Stair training;Functional mobility training;Therapeutic activities;Therapeutic exercise;Balance training;Neuromuscular re-education;Cognitive remediation;Patient/family education;Orthotic Fit/Training;Manual techniques;Passive range of motion;Energy conservation;Taping;Splinting;Vestibular;Visual/perceptual remediation/compensation;Joint Manipulations    PT Next Visit Plan strength, endurance, balance and gait interventions    PT Home Exercise Plan No changes to HEP this visit    Consulted and Agree with Plan of Care Patient;Family member/caregiver    Family Member Consulted sister             Patient will benefit from skilled therapeutic intervention in order to improve the following deficits and impairments:  Abnormal gait, Decreased activity tolerance, Decreased endurance, Decreased range of motion, Decreased strength, Hypomobility, Improper body mechanics, Decreased balance, Decreased coordination, Decreased mobility, Difficulty walking, Impaired flexibility, Postural dysfunction, Impaired vision/preception, Impaired sensation  Visit Diagnosis: Abnormality of gait and mobility  Difficulty in  walking, not elsewhere classified  Muscle weakness (generalized)  Unsteadiness on feet     Problem List 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 [place], [place], [address] Phone: [phone]   Fax:  [phone]  Name: Yaa Donnellan MRN: [id number] Date of Birth: Jan 15, 1951

## 2021-09-07 NOTE — Therapy (Signed)
[place] MAIN [facility] SERVICES [address] [place], [place], [address] Phone: [phone]   Fax:  [phone]  Occupational Therapy Treatment  Patient Details  Name: Laura [name] MRN: [id number] Date of Birth: November 13, 1951 No data recorded  Encounter Date: 09/07/2021   OT End of Session - 09/07/21 1306     Visit Number 19    Number of Visits 24    Date for OT Re-Evaluation 10/04/21    Authorization Type Progress report starting 08/04/2021    OT Start Time 1015    OT Stop Time 1100    OT Time Calculation (min) 45 min    Activity Tolerance Patient tolerated treatment well    Behavior During Therapy [facility] for tasks assessed/performed             Past Medical History:  Diagnosis Date   Acute CVA (cerebrovascular accident) ([place]) 05/24/2021   Diabetes mellitus without complication ([place]) 88/28/0034   Hyperlipidemia 05/23/2021   Hypertension 05/23/2021   Left forearm fracture    in high school--was casted   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   Foot fracture, no surgery   TONSILLECTOMY      There were no vitals filed for this visit.   Subjective Assessment - 09/07/21 1303     Subjective  "I feel like I'm doing better."    Patient is accompanied by: Family member    Pertinent History Per chart, Laura [name] is a 70 y.o. female in relatively good health but no medical care for years who was admitted on 05/23/2021 to [facility] with garbled speech, right facial droop, vision changes and right upper committee weakness.  Per reports she started feeling weak on 06/14 but refused to come to the hospital.  MRI brain done revealing acute nonhemorrhagic infarct in left caudate head and anterior lentiform nucleus as well as chronic microvascular ischemia mildly advanced for age.  CTA head/neck was negative for AVM, aneurysm or stenosis.   She was noted to have low vitamin B12 level at 97 and was started on supplementation.  Stroke was felt to be due to small vessel disease and neurology recommended DAPT x3 weeks followed by aspirin alone.  She continued to be limited by weakness with dysarthria, tachycardia with activity, balance deficits as well as motor planning deficits with ADLs.  CIR was recommended due to functional decline from 05-26-2021 to 06-02-2021.    Patient Stated Goals Pt would like to go home and be as independent as possible    Currently in Pain? No/denies    Pain Score 0-No pain    Multiple Pain Sites No            Occupational Therapy Treatment: Neuro re-ed: Fingertip prehension and pinching addressed with snapping/unsnapping small snap beads.  Placed Jamar pegs into pegboard with R hand and removed with tweezers showing good dexterity with this task.    Therapeutic Exercise: Completed 8 min on Scifit at level 4, last 2 min were reverse rotation, working to increase BUE strength for self care.   Response to Treatment: Pt reporting that she is feeling good with use of her R hand.  Sister reports that she really is using her arm well and noted only that she's more cautious to hold a dish in her R hand when washing dishes d/t weaker grip strength.  Pt  reports she's using her putty maybe 1x per week.  OT encouraged daily use for 5-10 min, 1-2 x daily, encouraged to squeeze during a commercial break on tv.  Sister reports the only thing she's helping pt with is cooking d/t her visual deficits, and reinforcing good hygiene, which sister states she will always have to do.  Discussed upcoming progress note and potential discharge next visit pending goal achievement.  If more visits are needed, anticipate that pt will be ready for OT d/c by end of cert period which is in the next couple of week; both pt and sister are in agreement with plan.     OT Education - 09/07/21 1306     Education Details encouraged use of  putty daily for increasing R grip strength    Person(s) Educated Patient;Caregiver(s)    Methods Explanation;Verbal cues;Handout    Comprehension Verbalized understanding;Need further instruction;Verbal cues required                 OT Long Term Goals - 08/04/21 1321       OT LONG TERM GOAL #1   Title Pt will be independent with home exercise program.    Baseline 08/04/2021: Independent with Theraputty HEP.    Time 12    Period Weeks    Status Partially Met    Target Date 10/04/21      OT LONG TERM GOAL #2   Title Pt will improve FOTO score to 73 or above to demonstrate a clinically relevant change to impact greater independence in necessary daily tasks.    Baseline 08/04/2021: 65 Eval score of 61    Time 12    Period Weeks    Status New    Target Date 10/04/21      OT LONG TERM GOAL #3   Title Pt will complete light meal prep with modified independence.    Baseline 08/04/2021: Pt. is now preparing lunch sandwiches  independently. pt. is able to use the microwave. Pt. requires assist from family    Time 12    Period Weeks    Status Partially Met    Target Date 10/04/21      OT LONG TERM GOAL #4   Title Pt will complete laundry tasks with modified independence    Baseline 08/04/2021: Pt. is able to wash, transfer, and fold laundry. Pt. has improved, however requires occ. assist with the laundry controls. Eval: requires assist from family    Time 12    Period Weeks    Status New    Target Date 10/04/21      OT LONG TERM GOAL #5   Title Pt will complete check writing with modified independence.    Baseline 08/04/2021: Pt. was able to independently complete, and has her sister review it. Eval:  difficulty    Time 6    Period Weeks    Status Achieved      OT LONG TERM GOAL #6   Title Pt will demonstrate handwriting with legibility 80% or greater to write demographics and signature  on important paper.    Baseline 08/04/2021: Writing 70-80% legibility. EvaL difficulty  with poor legibility    Time 6    Period Weeks    Status On-going    Target Date 08/23/21      OT LONG TERM GOAL #7   Title Pt will complete basic self care tasks with modified independence.    Baseline increased time and occasional assist    Time 6  Period Weeks    Status Achieved      OT LONG TERM GOAL #8   Title Pt to improve coordination by reduction of time on 9 hole peg test by 10 secs to assist with performing coordination tasks with writing, buttons, zippers.    Baseline 08/04/2021: 46 sec. Eval: right 52 secs    Time 12    Period Weeks    Status On-going    Target Date 10/04/21      OT LONG TERM GOAL  #9   TITLE Pt will improve right grip strength by 10# to open jars, containers with modified independence.    Baseline 08/04/2021: 39# Eval: right 28#    Time 12    Period Weeks    Status On-going    Target Date 10/04/21                   Plan - 09/07/21 1318     Clinical Impression Statement Pt reporting that she is feeling good with use of her R hand.  Sister reports that she really is using her arm well and noted only that she's more cautious to hold a dish in her R hand when washing dishes d/t weaker grip strength.  Pt reports she's using her putty maybe 1x per week.  OT encouraged daily use for 5-10 min, 1-2 x daily, encouraged to squeeze during a commercial break on tv.  Sister reports the only thing she's helping pt with is cooking d/t her visual deficits, and reinforcing good hygiene, which sister states she will always have to do.  Discussed upcoming progress note and potential discharge next visit pending goal achievement.  If more visits are needed, anticipate that pt will be ready for OT d/c by end of cert period which is in the next couple of week; both pt and sister are in agreement with plan.    OT Occupational Profile and History Detailed Assessment- Review of Records and additional review of physical, cognitive, psychosocial history related to current  functional performance    Occupational performance deficits (Please refer to evaluation for details): ADL's;IADL's;Leisure    Body Structure / Function / Physical Skills ADL;Dexterity;Flexibility;Strength;Balance;Coordination;FMC;IADL;UE functional use;Mobility    Psychosocial Skills Environmental  Adaptations;Habits;Routines and Behaviors    Rehab Potential Good    Clinical Decision Making Limited treatment options, no task modification necessary    Comorbidities Affecting Occupational Performance: May have comorbidities impacting occupational performance    Modification or Assistance to Complete Evaluation  No modification of tasks or assist necessary to complete eval    OT Frequency 2x / week    OT Duration 12 weeks    OT Treatment/Interventions Self-care/ADL training;Cryotherapy;Therapeutic exercise;DME and/or AE instruction;Functional Mobility Training;Cognitive remediation/compensation;Balance training;Neuromuscular education;Manual Therapy;Splinting;Visual/perceptual remediation/compensation;Moist Heat;Contrast Bath;Therapeutic activities;Patient/family education    Consulted and Agree with Plan of Care Patient;Family member/caregiver             Patient will benefit from skilled therapeutic intervention in order to improve the following deficits and impairments:   Body Structure / Function / Physical Skills: ADL, Dexterity, Flexibility, Strength, Balance, Coordination, FMC, IADL, UE functional use, Mobility   Psychosocial Skills: Environmental  Adaptations, Habits, Routines and Behaviors   Visit Diagnosis: Muscle weakness (generalized)  Other lack of coordination  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 artery [facility])    Problem List Patient Active Problem List   Diagnosis Date Noted   Cerebrovascular accident (CVA) ([place]) 09/06/2021   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/06/2021  Hyperlipidemia associated with type 2 diabetes mellitus  ([place]) 06/02/2021   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) 06/02/2021   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 (CVA) 06/02/2021   Vitamin D deficiency 06/02/2021   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

## 2021-09-08 NOTE — Therapy (Signed)
[place] MAIN [facility] SERVICES [address] [place], [place], [address] Phone: [phone]   Fax:  [phone]  Speech Language Pathology Treatment  Patient Details  Name: Laura [name] MRN: [id number] Date of Birth: 09-08-1951 Referring Provider (SLP): Lauraine Rinne, PA-C   Encounter Date: 09/07/2021   End of Session - 09/08/21 0825     Visit Number 14    Number of Visits 25    Date for SLP Re-Evaluation 10/04/21    Authorization Type [employer]    SLP Start Time 1500    SLP Stop Time  1600    SLP Time Calculation (min) 60 min    Activity Tolerance Patient tolerated treatment well             Past Medical History:  Diagnosis Date   Acute CVA (cerebrovascular accident) ([place]) 05/24/2021   Diabetes mellitus without complication ([place]) 78/67/5449   Hyperlipidemia 05/23/2021   Hypertension 05/23/2021   Left forearm fracture    in high school--was casted   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   Foot fracture, no surgery   TONSILLECTOMY      There were no vitals filed for this visit.   Subjective Assessment - 09/08/21 0808     Subjective "Oh, yesterday?" (when SLP asked about her earlier OT/PT appts, this morning)    Patient is accompained by: Family member    Currently in Pain? No/denies                   ADULT SLP TREATMENT - 09/08/21 0809       General Information   Behavior/Cognition Alert;Cooperative    HPI Laura [name] is a 70 year old female with past medical hx noted for DM II, HLD,   admitted at [facility] on 05/23/21 with garbled speech, right facial droop, vision changes and  RUE weakness. MRI brain showed acute infarct in left caudate head and anterior lentiform nucleus, chronic microvascular ischemia. Resulting transcortical motor aphasia, dysarthria and cognitive deficits. Blood sugar was  372 on admission. Admitted to CIR 6/22- 06/02/21.      Cognitive-Linquistic Treatment   Treatment focused on Aphasia;Cognition    Skilled Treatment Reviewed sentences written at home; accuracy 90%, however pt has difficulty reviewing due to visual deficits. Saw neurologist on Monday; further imaging necessary to determine if pt will be cleared for cataract surgery. Targeted verbal expression with structured, brief, conversational exchanges. Pt requested repetition of stimuli occasionally. Upon answering question about a given scenario, pt answered follow-up questions/provided rationale for response with min-mod question cues and extended time. Occasional cues for anomia compensations.      Assessment / Recommendations / Plan   Plan Continue with current plan of care      Progression Toward Goals   Progression toward goals Progressing toward goals              SLP Education - 09/08/21 0825     Education Details anomia compensations (describe)    Person(s) Educated Patient    Methods Explanation    Comprehension Verbalized understanding              SLP Short Term Goals - 09/08/21 0820       SLP SHORT TERM GOAL #1   Title Patient will name average of 8+ items for simple /concrete categories.  Time 10    Period --   sessions   Status Partially Met    Target Date 08/18/21      SLP SHORT TERM GOAL #2   Title Patient will generate fluent, grammatical, and cogent sentence to complete concrete linguistic tasks with 80% accuracy.    Time 10    Period --   sessions   Status Achieved    Target Date 08/18/21      SLP SHORT TERM GOAL #3   Title The patient will decode and read aloud sentences of varying length and complexity with 80% accuracy.    Time 10    Period --   sessions   Status Achieved    Target Date 08/18/21      SLP SHORT TERM GOAL #4   Title Patient will complete standardized assessment of cognitive communication skills.    Time 2    Period Weeks    Status  Achieved    Target Date 07/21/21      SLP SHORT TERM GOAL #5   Title Patient will write logical/appropriate sentences of 8-10 words 80% accuracy with min cues for double checking.    Time 10    Period --   sessions   Status New      SLP SHORT TERM GOAL #6   Title Patient will use compensations for aphasia/dysarthria in 5 min conversation with occasional min cues.    Time 10    Period --   sessions   Status New      SLP SHORT TERM GOAL #7   Title Patient will identify when she is perseverating and choose an alternative (break, use a strategy), 75% of the time with min verbal cue.    Time 10    Period --   sessions   Status New              SLP Long Term Goals - 08/19/21 1706       SLP LONG TERM GOAL #1   Title Patient will use compensations for aphasia and dysarthria in 10 minutes moderately complex conversation to express thoughts and opinions with listener comprehension at least 90%.    Time 12    Period Weeks    Status On-going      SLP LONG TERM GOAL #2   Title Patient will write or verbally sequence steps to complete simple to mod complex personally relevant tasks (ADLs, IADLs).    Time 12    Period Weeks    Status On-going      SLP LONG TERM GOAL #3   Title Pt will read paragraph-level materials relating to personal interests, financial and medical matters demonstrating comprehension of details >90% accuracy.    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 Pt requires cues/prompting for engagement in conversation; while aphasia is a limiting factor, suspect pt social preferences also contributes (lived alone for many years without much social interaction). I recommend skilled ST to address aphasia, dysarthria, and cognitive-communication deficits in order to improve ability to  communicate wants and needs and increase pt independence, as pt goal is to return to living independently.    Speech Therapy Frequency 2x / week    Duration 12 weeks    Treatment/Interventions  Language facilitation;Environmental controls;Cueing hierarchy;SLP instruction and feedback;Compensatory techniques;Cognitive reorganization;Functional tasks;Compensatory strategies;Internal/external aids;Multimodal communcation approach;Patient/family education    Potential to Achieve Goals Good    SLP Home Exercise Plan TBD    Consulted and Agree with Plan of Care Patient;Family member/caregiver             Patient will benefit from skilled therapeutic intervention in order to improve the following deficits and impairments:   Aphasia  Cognitive communication deficit  Dysarthria and anarthria    Problem List Patient Active Problem List   Diagnosis Date Noted   Cerebrovascular accident (CVA) ([place]) 09/06/2021   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/06/2021   Hyperlipidemia associated with type 2 diabetes mellitus ([place]) 06/02/2021   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 [place], [place], [address] Phone: [phone]   Fax:  [phone]   Name: Laura [name] MRN: [id number] Date of Birth: 13-Aug-1951

## 2021-09-09 ENCOUNTER — Encounter: Payer: Self-pay | Admitting: Physical Medicine and Rehabilitation

## 2021-09-09 ENCOUNTER — Encounter
Payer: Medicare Other | Attending: Physical Medicine and Rehabilitation | Admitting: Physical Medicine and Rehabilitation

## 2021-09-09 ENCOUNTER — Ambulatory Visit: Payer: Medicare Other | Admitting: Speech Pathology

## 2021-09-09 ENCOUNTER — Other Ambulatory Visit: Payer: Self-pay

## 2021-09-09 VITALS — BP 126/75 | HR 87 | Temp 98.3°F | Ht 68.0 in | Wt 147.8 lb

## 2021-09-09 DIAGNOSIS — I639 Cerebral infarction, unspecified: Secondary | ICD-10-CM | POA: Insufficient documentation

## 2021-09-09 DIAGNOSIS — R4701 Aphasia: Secondary | ICD-10-CM

## 2021-09-09 DIAGNOSIS — E118 Type 2 diabetes mellitus with unspecified complications: Secondary | ICD-10-CM | POA: Insufficient documentation

## 2021-09-09 DIAGNOSIS — R41841 Cognitive communication deficit: Secondary | ICD-10-CM

## 2021-09-09 DIAGNOSIS — M6281 Muscle weakness (generalized): Secondary | ICD-10-CM | POA: Diagnosis not present

## 2021-09-09 DIAGNOSIS — R471 Dysarthria and anarthria: Secondary | ICD-10-CM

## 2021-09-09 MED ORDER — FREESTYLE LIBRE 2 SENSOR MISC: 1.0000 | Freq: Four times a day (QID) | 0 refills | Status: DC

## 2021-09-09 MED ORDER — METFORMIN HCL 850 MG PO TABS: 850.0000 mg | ORAL_TABLET | Freq: Two times a day (BID) | ORAL | 3 refills | Status: DC

## 2021-09-09 MED ORDER — FREESTYLE LIBRE 2 READER DEVI: 1.0000 | Freq: Every day | 3 refills | Status: DC

## 2021-09-09 NOTE — Therapy (Signed)
[place] MAIN [facility] SERVICES [address] [place], [place], [address] Phone: [phone]   Fax:  [phone]  Speech Language Pathology Treatment  Patient Details  Name: Laura [name] MRN: [id number] Date of Birth: 1951-04-18 Referring Provider (SLP): Lauraine Rinne, PA-C   Encounter Date: 09/09/2021   End of Session - 09/09/21 1750     Visit Number 15    Number of Visits 25    Date for SLP Re-Evaluation 10/04/21    Authorization Type [employer]    SLP Start Time 62    SLP Stop Time  1400    SLP Time Calculation (min) 60 min    Activity Tolerance Patient tolerated treatment well             Past Medical History:  Diagnosis Date   Acute CVA (cerebrovascular accident) ([place]) 05/24/2021   Diabetes mellitus without complication ([place]) 59/93/5701   Hyperlipidemia 05/23/2021   Hypertension 05/23/2021   Left forearm fracture    in high school--was casted   Stroke (cerebrum) ([place]) 05/26/2021   Stroke ([place]) 05/23/2021    Past Surgical History:  Procedure Laterality Date   FOOT SURGERY     FRACTURE SURGERY  ?   Foot fracture, no surgery   TONSILLECTOMY      There were no vitals filed for this visit.   Subjective Assessment - 09/09/21 1746     Subjective Hands homework to SLP    Currently in Pain? No/denies                   ADULT SLP TREATMENT - 09/09/21 1746       General Information   Behavior/Cognition Alert;Cooperative    HPI Laura [name] is a 70 year old female with past medical hx noted for DM II, HLD,   admitted at [facility] on 05/23/21 with garbled speech, right facial droop, vision changes and  RUE weakness. MRI brain showed acute infarct in left caudate head and anterior lentiform nucleus, chronic microvascular ischemia. Resulting transcortical motor aphasia, dysarthria and cognitive deficits. Blood sugar was 372 on admission. Admitted to CIR 6/22- 06/02/21.      Cognitive-Linquistic Treatment   Treatment  focused on Aphasia;Cognition    Skilled Treatment Targeted verbal expression and dysarthria; pt generated sentences verbally with given word with extended time, usual mod cues to slow rate. Pt receptive to cues for rate vs vocal intensity to increase intelligibility. Verbal sequencing with pt providing plausible "next step" 75% of scenarios. Progressed to simple conversation re: topic of pt interest (animals/wildlife). Pt required frequent verbal cues for slower rate/louder speech, less cuing necessary as session progressed. Occasional min cues for anomia.      Assessment / Recommendations / Plan   Plan Continue with current plan of care      Progression Toward Goals   Progression toward goals Progressing toward goals              SLP Education - 09/09/21 1749     Education Details slower and louder speech for intelligibility    Person(s) Educated Patient    Methods Explanation    Comprehension Verbalized understanding              SLP Short Term Goals - 09/08/21 0820       SLP SHORT TERM GOAL #1   Title Patient will name average of 8+ items for simple /concrete categories.    Time 10    Period --   sessions   Status  Partially Met    Target Date 08/18/21      SLP SHORT TERM GOAL #2   Title Patient will generate fluent, grammatical, and cogent sentence to complete concrete linguistic tasks with 80% accuracy.    Time 10    Period --   sessions   Status Achieved    Target Date 08/18/21      SLP SHORT TERM GOAL #3   Title The patient will decode and read aloud sentences of varying length and complexity with 80% accuracy.    Time 10    Period --   sessions   Status Achieved    Target Date 08/18/21      SLP SHORT TERM GOAL #4   Title Patient will complete standardized assessment of cognitive communication skills.    Time 2    Period Weeks    Status Achieved    Target Date 07/21/21      SLP SHORT TERM GOAL #5   Title Patient will write logical/appropriate  sentences of 8-10 words 80% accuracy with min cues for double checking.    Time 10    Period --   sessions   Status New      SLP SHORT TERM GOAL #6   Title Patient will use compensations for aphasia/dysarthria in 5 min conversation with occasional min cues.    Time 10    Period --   sessions   Status New      SLP SHORT TERM GOAL #7   Title Patient will identify when she is perseverating and choose an alternative (break, use a strategy), 75% of the time with min verbal cue.    Time 10    Period --   sessions   Status New              SLP Long Term Goals - 08/19/21 1706       SLP LONG TERM GOAL #1   Title Patient will use compensations for aphasia and dysarthria in 10 minutes moderately complex conversation to express thoughts and opinions with listener comprehension at least 90%.    Time 12    Period Weeks    Status On-going      SLP LONG TERM GOAL #2   Title Patient will write or verbally sequence steps to complete simple to mod complex personally relevant tasks (ADLs, IADLs).    Time 12    Period Weeks    Status On-going      SLP LONG TERM GOAL #3   Title Pt will read paragraph-level materials relating to personal interests, financial and medical matters demonstrating comprehension of details >90% accuracy.    Time 12    Period Weeks    Status On-going              Plan - 09/09/21 1750     Clinical Impression Statement Laura [name] presents with mild anomic aphasia, mild dysarthria, and mild-moderate cognitive deficits s/p CVA. Pt requires cues/prompting for engagement in conversation; while aphasia is a limiting factor, suspect pt social preferences also contributes (lived alone for many years without much social interaction). Pt was more interactive and initiated some exchanges when topics of interest discussed. I recommend skilled ST to address aphasia, dysarthria, and cognitive-communication deficits in order to improve ability to communicate wants and needs  and increase pt independence, as pt goal is to return to living independently.    Speech Therapy Frequency 2x / week    Duration 12 weeks    Treatment/Interventions  Language facilitation;Environmental controls;Cueing hierarchy;SLP instruction and feedback;Compensatory techniques;Cognitive reorganization;Functional tasks;Compensatory strategies;Internal/external aids;Multimodal communcation approach;Patient/family education    Potential to Achieve Goals Good    SLP Home Exercise Plan TBD    Consulted and Agree with Plan of Care Patient;Family member/caregiver             Patient will benefit from skilled therapeutic intervention in order to improve the following deficits and impairments:   Aphasia  Cognitive communication deficit  Dysarthria and anarthria    Problem List Patient Active Problem List   Diagnosis Date Noted   Cerebrovascular accident (CVA) ([place]) 09/06/2021   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/06/2021   Hyperlipidemia associated with type 2 diabetes mellitus ([place]) 06/02/2021   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) 06/02/2021   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 (CVA) 06/02/2021   Vitamin D deficiency 06/02/2021   Aphasia due to acute stroke ([place]) 06/02/2021   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

## 2021-09-09 NOTE — Patient Instructions (Signed)
   Diabetes: -check CBGs daily, log, and bring log to follow-up appointment -avoid sugar, bread, pasta, rice -avoid snacking -perform daily foot exam and at least annual eye exam -try to incorporate into your diet some of the following foods which are good for diabetes: 1) cinnamon- imitates effects of insulin, increasing glucose transport into cells ([place] or [place] cinnamon is best, least processed) 2) nuts- can slow down the blood sugar response of carbohydrate rich foods 3) oatmeal- contains and anti-inflammatory compound avenanthramide 4) whole-milk yogurt (best types are no sugar, Greek yogurt, or goat/sheep yogurt) 5) beans- high in protein, fiber, and vitamins, low glycemic index 6) broccoli- great source of vitamin A and C 7) quinoa- higher in protein and fiber than other grains 8) spinach- high in vitamin A, fiber, and protein 9) olive oil- reduces glucose levels, LDL, and triglycerides 10) salmon- excellent amount of omega-3-fatty acids 11) walnuts- rich in antioxidants 12) apples- high in fiber and quercetin 13) carrots- highly nutritious with low impact on blood sugar 14) eggs- improve HDL (good cholesterol), high in protein, keep you satiated 15) turmeric: improves blood sugars, cardiovascular disease, and protects kidney health 16) garlic: improves blood sugar, blood pressure, pain 17) tomatoes: highly nutritious with low impact on blood sugar  

## 2021-09-09 NOTE — Progress Notes (Signed)
Subjective:    Patient ID: Laura [name], female    DOB: 11-Jul-1951, 70 y.o.   MRN: [id number]  HPI  Laura [name] is a 70 year old woman who presents with lower extremity weakness secondary to CVA  -she continues to work with PT and OT, she is doing really well -SLP is her hardest area.  -her sister accompanies her today.  -she does fine on level surfaces.  -she has all refills from PCP -glucose levels a little high and metformin was added.  -nightime glucose was 200,fasting 150-160. Now fasting is around 100 and evenings more in 130-140 -had some diarrhea.  -she keeps a chart with her BP and CBGs every day.  -denies pain -[place] bacon, fruit, eggs for breakfast.    Pain Inventory Average Pain 0 Pain Right Now 0 My pain is  No pain her for stoke follow up  LOCATION OF PAIN  No pain her for stroke follow up  BOWEL Number of stools per week: 3   BLADDER Pads  Bladder incontinence Yes     Mobility how many minutes can you walk? 30 minutes ability to climb steps?  yes do you drive?  no Do you have any goals in this area?  yes  Function retired Do you have any goals in this area?  yes  Neuro/Psych trouble walking  Prior Studies Any changes since last visit?  no  Physicians involved in your care Any changes since last visit?  yes Dentist & Eye doctor   Family History  Problem Relation Age of Onset   Hypertension Mother    Diabetes Mother    Arthritis Mother    Heart disease Mother    Heart failure Father    Stroke Father    COPD Father    Hypertension Sister    Arthritis Sister    Heart disease Maternal Grandfather    Heart disease Maternal Grandmother    Stroke Paternal Grandfather    Stroke Paternal Grandmother    Arthritis Sister    Hypertension Sister    Cancer Maternal Aunt    Diabetes Maternal Aunt    Cancer Paternal Aunt    Hypertension Sister    Varicose Veins Sister    Social History   Socioeconomic History   Marital status:  Single    Spouse name: Not on file   Number of children: Not on file   Years of education: Not on file   Highest education level: Not on file  Occupational History   Not on file  Tobacco Use   Smoking status: Never    Passive exposure: Never   Smokeless tobacco: Never  Vaping Use   Vaping Use: Never used  Substance and Sexual Activity   Alcohol use: Never   Drug use: Never   Sexual activity: Not Currently    Birth control/protection: None  Other Topics Concern   Not on file  Social History Narrative   Not on file   Social Determinants of Health   Financial Resource Strain: Low Risk    Difficulty of Paying Living Expenses: Not hard at all  Food Insecurity: No Food Insecurity   Worried About Programme researcher, broadcasting/film/video in the Last Year: Never true   Ran Out of Food in the Last Year: Never true  Transportation Needs: No Transportation Needs   Lack of Transportation (Medical): No   Lack of Transportation (Non-Medical): No  Physical Activity: Insufficiently Active   Days of Exercise per Week: 4 days  Minutes of Exercise per Session: 30 min  Stress: No Stress Concern Present   Feeling of Stress : Not at all  Social Connections: Socially Isolated   Frequency of Communication with Friends and Family: Never   Frequency of Social Gatherings with Friends and Family: Never   Attends Religious Services: Never   Database administrator or Organizations: No   Attends Engineer, structural: Never   Marital Status: Never married   Past Surgical History:  Procedure Laterality Date   FOOT SURGERY     FRACTURE SURGERY  ?   Foot fracture, no surgery   TONSILLECTOMY     Past Medical History:  Diagnosis Date   Acute CVA (cerebrovascular accident) ([facility]) 05/24/2021   Diabetes mellitus without complication ([facility]) 05/23/2021   Hyperlipidemia 05/23/2021   Hypertension 05/23/2021   Left forearm fracture    in high school--was casted   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   Stroke ([facility])  05/23/2021   BP 126/75   Pulse 87   Temp 98.3 F (36.8 C)   Ht 5\' 8"  (1.727 m)   Wt 147 lb 12.8 oz (67 kg)   SpO2 96%   BMI 22.47 kg/m   Opioid Risk Score:   Fall Risk Score:  `1  Depression screen PHQ 2/9  Depression screen [facility] 2/9 09/09/2021 09/09/2021 08/11/2021 07/26/2021 06/02/2021  Decreased Interest 0 0 0 0 3  Down, Depressed, Hopeless 0 - 0 0 3  PHQ - 2 Score 0 0 0 0 6  Altered sleeping 0 - 0 0 3  Tired, decreased energy 3 - 0 0 3  Change in appetite 0 - 0 0 2  Feeling bad or failure about yourself  0 - 0 0 0  Trouble concentrating 0 - 0 0 0  Moving slowly or fidgety/restless 0 - 0 0 3  Suicidal thoughts 0 - 0 0 0  PHQ-9 Score 3 - 0 0 17  Difficult doing work/chores - - - - Extremely dIfficult    Review of Systems  Genitourinary:        Incontinence   Musculoskeletal:  Positive for gait problem.       Balance off  Neurological:  Positive for seizures.      Objective:   Physical Exam Gen: no distress, normal appearing HEENT: oral mucosa pink and moist, NCAT Cardio: Reg rate Chest: normal effort, normal rate of breathing Abd: soft, non-distended Ext: no edema Psych: pleasant, normal affect Skin: intact Neuro: Alert and oriented x3 Musculoskeletal: 5/5 strength throughout    Assessment & Plan:   1) CVA -continue therapies -would benefit from handicap placard to increase her mobility in the community -provided dietary and exercise counseling -discussed that hypertension is number one reversible risk factor for stroke.    2) Diabetes: -check CBGs daily, log, and bring log to follow-up appointment -avoid sugar, bread, pasta, rice -avoid snacking -prescribed freestyle libre.  -perform daily foot exam and at least annual eye exam -discussed benefits of intermittent fasting -try to incorporate into your diet some of the following foods which are good for diabetes: 1) cinnamon- imitates effects of insulin, increasing glucose transport into cells (06/04/2021 or  [place] cinnamon is best, least processed) 2) nuts- can slow down the blood sugar response of carbohydrate rich foods 3) oatmeal- contains and anti-inflammatory compound avenanthramide 4) whole-milk yogurt (best types are no sugar, [place] yogurt, or goat/sheep yogurt) 5) beans- high in protein, fiber, and vitamins, low glycemic index 6) broccoli- great source of vitamin  A and C 7) quinoa- higher in protein and fiber than other grains 8) spinach- high in vitamin A, fiber, and protein 9) olive oil- reduces glucose levels, LDL, and triglycerides 10) salmon- excellent amount of omega-3-fatty acids 11) walnuts- rich in antioxidants 12) apples- high in fiber and quercetin 13) carrots- highly nutritious with low impact on blood sugar 14) eggs- improve HDL (good cholesterol), high in protein, keep you satiated 15) turmeric: improves blood sugars, cardiovascular disease, and protects kidney health 16) garlic: improves blood sugar, blood pressure, pain 17) tomatoes: highly nutritious with low impact on blood sugar

## 2021-09-10 ENCOUNTER — Ambulatory Visit: Payer: Medicare Other | Admitting: Physical Therapy

## 2021-09-10 ENCOUNTER — Ambulatory Visit: Payer: Medicare Other

## 2021-09-10 DIAGNOSIS — M6281 Muscle weakness (generalized): Secondary | ICD-10-CM

## 2021-09-10 DIAGNOSIS — R269 Unspecified abnormalities of gait and mobility: Secondary | ICD-10-CM

## 2021-09-10 DIAGNOSIS — I6300 Cerebral infarction due to thrombosis of unspecified precerebral artery: Secondary | ICD-10-CM

## 2021-09-10 DIAGNOSIS — R2681 Unsteadiness on feet: Secondary | ICD-10-CM

## 2021-09-10 DIAGNOSIS — R278 Other lack of coordination: Secondary | ICD-10-CM

## 2021-09-10 DIAGNOSIS — R2689 Other abnormalities of gait and mobility: Secondary | ICD-10-CM

## 2021-09-10 DIAGNOSIS — R262 Difficulty in walking, not elsewhere classified: Secondary | ICD-10-CM

## 2021-09-10 NOTE — Therapy (Signed)
[place] MAIN [facility] SERVICES [address] [place], [place], [address] Phone: [phone]   Fax:  [phone]  Occupational Therapy Discharge  Patient Details  Name: Laura [name] MRN: [id number] Date of Birth: 1951-02-15 No data recorded  Encounter Date: 09/10/2021   OT End of Session - 09/10/21 1227     Visit Number 20    Number of Visits 24    Date for OT Re-Evaluation 10/04/21    Authorization Type Progress report starting 08/04/2021    OT Start Time 0845    OT Stop Time 0925    OT Time Calculation (min) 40 min    Activity Tolerance Patient tolerated treatment well    Behavior During Therapy [facility] for tasks assessed/performed             Past Medical History:  Diagnosis Date   Acute CVA (cerebrovascular accident) ([place]) 05/24/2021   Diabetes mellitus without complication ([place]) 27/74/1287   Hyperlipidemia 05/23/2021   Hypertension 05/23/2021   Left forearm fracture    in high school--was casted   Stroke (cerebrum) ([place]) 05/26/2021   Stroke ([place]) 05/23/2021    Past Surgical History:  Procedure Laterality Date   FOOT SURGERY     FRACTURE SURGERY  ?   Foot fracture, no surgery   TONSILLECTOMY      There were no vitals filed for this visit.   Subjective Assessment - 09/10/21 1219     Subjective  We've discussed the importance of setting aside time each day to work on her hand." (per sister, Fraser Din)    Patient is accompanied by: Family member    Pertinent History Per chart, Lavene Penagos is a 70 y.o. female in relatively good health but no medical care for years who was admitted on 05/23/2021 to [facility] with garbled speech, right facial droop, vision changes and right upper committee weakness.  Per reports she started feeling weak on 06/14 but refused to come to the hospital.  MRI brain done revealing acute nonhemorrhagic infarct in left caudate head and anterior lentiform nucleus as well as chronic microvascular ischemia mildly  advanced for age.  CTA head/neck was negative for AVM, aneurysm or stenosis.  She was noted to have low vitamin B12 level at 97 and was started on supplementation.  Stroke was felt to be due to small vessel disease and neurology recommended DAPT x3 weeks followed by aspirin alone.  She continued to be limited by weakness with dysarthria, tachycardia with activity, balance deficits as well as motor planning deficits with ADLs.  CIR was recommended due to functional decline from 05-26-2021 to 06-02-2021.    Patient Stated Goals Pt would like to go home and be as independent as possible    Currently in Pain? No/denies    Pain Score 0-No pain    Multiple Pain Sites No                [facility] OT Assessment - 09/10/21 0001       Assessment   Hand Dominance Right      Observation/Other Assessments   Focus on Therapeutic Outcomes (FOTO)  98      Coordination   Right 9 Hole Peg Test 42    Left 9 Hole Peg Test 26      Strength   Overall Strength Comments BUE strength: 5/5      Hand Function   Right Hand Grip (lbs) 44 lbs    Right Hand Lateral Pinch 13 lbs  Right Hand 3 Point Pinch 8 lbs    Left Hand Grip (lbs) 53    Left Hand Lateral Pinch 16 lbs    Left 3 point pinch 11 lbs            Occupational Therapy Discharge: Pt has completed 20 OT visits and presents with excellent gains toward all OT goals.  R hand grip and pinch strength and speed with [facility] tasks are still slightly less than L non-dominant side, but functionally, pt is managing all clothing fasteners, opening containers, etc (see goals) and feels confident with use of her RUE to perform ADL/IADL tasks.  Pt/caregivers understand tasks from HEP that will continue to target these skills for continued improvement.  Pt reports readiness for OT d/c and acknowledges recommendation to delegate 30-45 min daily for RUE HEP.  Therapeutic Activities: Reviewed goal status, remeasured strength/coordination throughout RUE, reviewed HEP  and reinforced activities to continue to progress R grip strength, pinch strength, and R hand coordination.    Pt to perform HEP for R hand strength and coordination daily 30-45 min sessions.  Encouraged pt to continue to target R hand grip strengthening and pinch strengthening with use of theraputty.  Reinforced [place] activities to target object manipulation with picking up small objects on table (ie coins), coloring, handwriting practice.   Pt Instructions: Choose between variety of activities daily: Theraputty-gripping and pinching Coin manipulation activities: picking up from table, removing 1 at a time from palm of hand, stacking coins, placing in a slotted bank [place]   OT Education - 09/10/21 1226     Education Details HEP review    Person(s) Educated Patient;Caregiver(s)    Methods Explanation;Demonstration;Verbal cues    Comprehension Verbalized understanding                 OT Long Term Goals - 09/10/21 1228       OT LONG TERM GOAL #1   Title Pt will be independent with home exercise program.    Baseline 08/04/2021: Independent with Theraputty HEP; 09/10/21: sister and brother-in-law are indep with HEP and help to provide cuing to pt as needed    Time 12    Period Weeks    Status Achieved    Target Date 10/04/21      OT LONG TERM GOAL #2   Title Pt will improve FOTO score to 73 or above to demonstrate a clinically relevant change to impact greater independence in necessary daily tasks    Baseline 08/04/2021: 65 Eval score of 61; 107/22: FOTO 98    Time 12    Period Weeks    Status Achieved    Target Date 10/04/21      OT LONG TERM GOAL #3   Title Pt will complete light meal prep with modified independence.    Baseline 08/04/2021: Pt. is now preparing lunch sandwiches  independently. pt. is able to use the microwave. Pt. requires assist from family; 09/10/2021: modified indep to perform light/cold meal prep (family supv use of stove top d/t visual  deficits)    Time 12    Period Weeks    Status Achieved    Target Date 10/04/21      OT LONG TERM GOAL #4   Title Pt will complete laundry tasks with modified independence    Baseline 08/04/2021: Pt. is able to wash, transfer, and fold laundry. Pt. has improved, however requires occ. assist with the laundry controls. Eval: requires assist from family; 09/10/21: assist only with washer/dryer  dials d/t low vision.  Physically able to manage laundry.    Time 12    Period Weeks    Status Achieved    Target Date 10/04/21      OT LONG TERM GOAL #5   Title Pt will complete check writing with modified independence.    Baseline 08/04/2021: Pt. was able to independently complete, and has her sister review it. Eval:  difficulty; 09/10/2021: Sister reviews for accuracy but has not had to make any changes.    Time 6    Period Weeks    Status Achieved    Target Date 08/23/21      OT LONG TERM GOAL #6   Title Pt will demonstrate handwriting with legibility 80% or greater to write demographics and signature  on important paper.    Baseline 08/04/2021: Writing 70-80% legibility. EvaL difficulty with poor legibility; 09/10/2021: Sister reports 95% legibility; able to demo 100% legibility in session this day, writing is slower from baseline but legible, and hand fatigues with repetition.    Time 6    Period Weeks    Status Partially Met    Target Date 08/23/21      OT LONG TERM GOAL #7   Title Pt will complete basic self care tasks with modified independence.    Baseline increased time and occasional assist; physically able to manage self care with modified indep (family provides cues and reminders for fequency of self care tasks to maintain hygiene, but sister anticipates this will be necessary ongoing    Time 6    Period Weeks    Status Partially Met    Target Date 08/23/21      OT LONG TERM GOAL #8   Title Pt to improve coordination by reduction of time on 9 hole peg test by 10 secs to assist with  performing coordination tasks with writing, buttons, zippers.    Baseline 08/04/2021: 46 sec. Eval: right 52 secs;09/10/21: 9 hole peg test 42 secs: hand writing 95-100% legible and pt is indep to manage clothing fasteners    Time 12    Period Weeks    Status Achieved    Target Date 10/04/21      OT LONG TERM GOAL  #9   TITLE Pt will improve right grip strength by 10# to open jars, containers with modified independence.    Baseline 08/04/2021: 39# Eval: right 28#; 09/10/21: grip strength 44 lbs    Time 12    Period Weeks    Status Achieved    Target Date 10/04/21              Plan - 09/10/21 1245     Clinical Impression Statement Pt has completed 20 OT visits and presents with excellent gains toward all OT goals.  R hand grip and pinch strength and speed with [facility] tasks are still slightly less than L non-dominant side, but functionally, pt is managing all clothing fasteners, opening containers, etc (see goals) and feels confident with use of her RUE to perform ADL/IADL tasks.  Pt/caregivers understand tasks from HEP that will continue to target these skills for continued improvement.  Pt reports readiness for OT d/c and acknowledges recommendation to delegate 30-45 min daily for RUE HEP.    OT Occupational Profile and History Detailed Assessment- Review of Records and additional review of physical, cognitive, psychosocial history related to current functional performance    Occupational performance deficits (Please refer to evaluation for details): ADL's;IADL's;Leisure    Body Structure /  Function / Physical Skills ADL;Dexterity;Flexibility;Strength;Balance;Coordination;FMC;IADL;UE functional use;Mobility    Psychosocial Skills Environmental  Adaptations;Habits;Routines and Behaviors    Rehab Potential Good    Clinical Decision Making Limited treatment options, no task modification necessary    Comorbidities Affecting Occupational Performance: May have comorbidities impacting occupational  performance    Modification or Assistance to Complete Evaluation  No modification of tasks or assist necessary to complete eval    OT Frequency 2x / week    OT Duration 12 weeks    OT Treatment/Interventions Self-care/ADL training;Cryotherapy;Therapeutic exercise;DME and/or AE instruction;Functional Mobility Training;Cognitive remediation/compensation;Balance training;Neuromuscular education;Manual Therapy;Splinting;Visual/perceptual remediation/compensation;Moist Heat;Contrast Bath;Therapeutic activities;Patient/family education    Consulted and Agree with Plan of Care Patient;Family member/caregiver             Patient will benefit from skilled therapeutic intervention in order to improve the following deficits and impairments:   Body Structure / Function / Physical Skills: ADL, Dexterity, Flexibility, Strength, Balance, Coordination, FMC, IADL, UE functional use, Mobility   Psychosocial Skills: Environmental  Adaptations, Habits, Routines and Behaviors   Visit Diagnosis: Muscle weakness (generalized)  Other lack of coordination  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 artery ([place])    Problem List Patient Active Problem List   Diagnosis Date Noted   Cerebrovascular accident (CVA) ([place]) 09/06/2021   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/06/2021   Hyperlipidemia associated with type 2 diabetes mellitus ([place]) 06/02/2021   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) 06/02/2021   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 (CVA) 06/02/2021   Vitamin D deficiency 06/02/2021   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

## 2021-09-10 NOTE — Therapy (Signed)
[facility] MAIN [facility] SERVICES [address] [place], [place], [address] Phone: [phone]   Fax:  [phone]  Physical Therapy Treatment  Patient Details  Name: Malgorzata Albert MRN: [id number] Date of Birth: 18-Jul-1951 Referring Provider (PT): Carlis Abbott Drema Pry, MD   Encounter Date: 09/10/2021   PT End of Session - 09/10/21 0906     Visit Number 17    Number of Visits 25    Date for PT Re-Evaluation 09/27/21    Authorization Type Medicare (traditional)    Authorization Time Period 07/05/21-09/27/21    Progress Note Due on Visit 20    PT Start Time 0930    PT Stop Time 1015    PT Time Calculation (min) 45 min    Equipment Utilized During Treatment Gait belt    Activity Tolerance Patient tolerated treatment well    Behavior During Therapy [facility] for tasks assessed/performed             Past Medical History:  Diagnosis Date   Acute CVA (cerebrovascular accident) ([facility]) 05/24/2021   Diabetes mellitus without complication ([facility]) 05/23/2021   Hyperlipidemia 05/23/2021   Hypertension 05/23/2021   Left forearm fracture    in high school--was casted   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   Foot fracture, no surgery   TONSILLECTOMY      There were no vitals filed for this visit.   Subjective Assessment - 09/10/21 0906     Subjective Pt reports she was happy with d/c for occupational therpay. Pt caregiver reports she has improved with stairs. Pt caregiver also reports difficulty with ambulation on uneven surfaces.    Patient is accompained by: Family member    Pertinent History Pt presents to evaluation with her sister as pt has difficulty with speech. Pt reports she has not been using an AD. Pt confirms she was admitted to hospital d/t CVA on 05/23/2021. Pt sister reports they are unsure of the onset of the stroke. Pt's  sister reports she checked on her sister after she received a birthday card and noticed her sister's signature looked abnormal. She called the pt and reports her voice was slurred. When she checked on her she said she had R side facial droop, difficulty with R hand. Pt's sister reports pt initially declined going to the ED but agreed to go the following morning where she was diagnosed with a CVA. Once d/c from the hospital the pt went to rehab for a week. Pt was living alone, independently prior to her stroke, but now lives with her sister and her sister's husband. Pt's sister says her husband walks with pt 15 mins 2x/day. Pt's sister reports after pt has been walking for a while she will start to drag her L leg. She says this has improved with time. Pt now requires some assistance with ADLs, including dressing, making her bed, and self-feeding, showering. Pt's sister reports pt now has vision issues as well as balance problems. She reports pt loses balance with dressing herself. Other PMH includes: HTN, DMII, Vitamin b12 deficiency, HLD, Vitamin D deficiency. Pt also with dysphasia, aphasia, weakness d/t recent stroke.    Limitations Walking;Writing;Standing;Lifting;House hold activities;Reading    How long can you sit comfortably? not affected    How long can you stand comfortably? 15 min    How long can you  walk comfortably? 15 minutes    Diagnostic tests per chart MRI brain revealed acute nonhemorrhagic infarct in L caudate had and anterior lentiform nucelus and chronic microvascular   ischemia mildly advanced for age. CTA head neck negative for AVM, aneurysms or stenosis. 2D echo showing EF 65 to 70% with borderline LVH.    Patient Stated Goals Pt would like to go home again                  Treatment provided this sessio   Neuro Re- Ed:   Tandem Stance (different surfaces incorporating transitions from various surfaces with safety) Ant LE on dynadisc with post LE on airex pad  -3 x 30  sec each LE -increased difficulty with R LE posterior.    Turns on airex pad or unsteady surface (balance beam)  -2 laps x2 repetitions.   Patient has 1 loss of balance when on balance beam and recovers with stepping strategy.  Following this loss of balance patient directed to hold onto rail on steps but with cues patient no longer utilize this strategy.  Patient was educated regarding stepping strategy is being trained and not to utilize handhold support when in clinic but if in home or community setting handhold support may be useful to prevent loss of balance or fall.   Rocker Board  -3x oriented vertically and horizontally with goal of maintaining balance vertical and lateral head turns with each 30 sec -Increased medial lateral sway with horizontal head turns, increased anterior posterior sway with superior and inferior head nods.  No loss of balance noted good recovery of balance with perturbation.  Airex eyes closed  3 x 30 sec, no LOB , mod sway  -Patient has some hesitancy with completing this activity but completes with good efficacy.  Heel raise on Foam Roller -2 x 10 with min UE assistance -In order to improve weight shifting anterior and posterior as well as to improve calf tightness and dorsiflexion range of motion.    There Act:  ambulate across stable and unstable surface outside. Negotiating changing surfaces from grass to sidewalk, across brick with turns and obstacles in pathway without LOB. X 9 minutes  -When ambulating on uneven surfaces such as grass or gravel patient demonstrates decreased in gait speed but no loss of balance noted.  Patient has good anticipation and reaction for stepping on uneven surfaces and slows gait speed in order to analyze the area before stepping -Patient reported some difficulty with ambulation on gravel.  No loss of balance noted with this activity.  Unless otherwise stated, CGA was provided and gait belt donned in order to ensure pt  safety   Pt required occasional rest breaks due fatigue, PT was quick to ask when pt appeared to be fatiguing in order to prevent excessive fatigue. Unless otherwise stated, SBA was provided and gait belt donned in order to ensure pt safety Pt educated throughout session about proper posture and technique with exercises. Improved exercise technique, movement at target joints, use of target muscles after min to mod verbal, visual, tactile cues.                       PT Education - 09/10/21 0906     Education Details Exercise forma and technique    Person(s) Educated Patient    Methods Explanation;Demonstration    Comprehension Verbalized understanding;Returned demonstration              PT Short Term Goals -  08/12/21 1402       PT SHORT TERM GOAL #1   Title Patient will be independent in home exercise program to improve strength/mobility for better functional independence with ADLs.    Baseline 07/05/2021: to be initiated next visit; 9/8: has been holding off due to medication side effect;    Time 6    Status On-going    Target Date 08/16/21               PT Long Term Goals - 08/12/21 1358       PT LONG TERM GOAL #1   Title Patient will increase FOTO score to equal to or greater than 79 to demonstrate statistically significant improvement in mobility and quality of life.    Baseline 8/1: 70    Time 12    Period Weeks    Status On-going    Target Date 09/27/21      PT LONG TERM GOAL #2   Title Patient will increase Berg Balance score by > 6 points to demonstrate decreased fall risk during functional activities.    Baseline 8/1: 46/56    Time 12    Period Weeks    Status On-going    Target Date 09/27/21      PT LONG TERM GOAL #3   Title The pt will ambulate at least 1700 ft during to demonstrate return toward age norm gait ability.    Baseline 8/1: 1398 ft no AD, CGA; 9/8: 1460 feet;    Time 12    Period Weeks    Status On-going     Target Date 09/27/21      PT LONG TERM GOAL #4   Title Pt to demonstrate improvement in Mini-BesTest score to 22/28 or higher to represent improved balance and reduced falls risk.    Baseline On 07/22/21: 15/28, 9/8: 18/28    Time 8    Period Weeks    Status New                   Plan - 09/10/21 0907     Clinical Impression Statement Patient tolerated treatment session well demonstrated improved tolerance for therapeutic activities and neuromuscular reactivation exercises.  Patient ambulated outside on various terrain's including grass, gravel, and uneven brick walkways, patient demonstrated good balance in these activities but did demonstrate some decrease in speed with ambulation and [place] on different surfaces.  Patient also demonstrated good anticipatory balance responses and took time to process her environment before walking on uneven surfaces.  Patient continues to demonstrate improved tolerance for neuromuscular reeducation exercises and continues have difficulty with tandem walking on Airex balance beam but is able to recover balance utilizing stepping strategy when necessary.  Patient will continue to benefit from skilled physical therapy intervention in order to improve her balance, reduce risk of falls, and improve her overall quality of life.    Personal Factors and Comorbidities Comorbidity 1;Comorbidity 2;Comorbidity 3+;Sex;Age    Comorbidities HTN, DMII, dysphasia, aphasia, weakness,    Examination-Activity Limitations Locomotion Level;Stairs;Dressing;Bathing;Reach Overhead;Lift;Caring for Others    Examination-Participation Restrictions Driving;Medication Management;Personal Finances;Cleaning;Meal Prep;Yard Work;Community Activity;Shop;Other;Laundry    Stability/Clinical Decision Making Evolving/Moderate complexity    Rehab Potential Good    PT Frequency 2x / week    PT Duration 12 weeks    PT Treatment/Interventions ADLs/Self Care Home  Management;Biofeedback;[place]ith Repostioning;Aquatic Therapy;Cryotherapy;Moist Heat;DME Instruction;Gait training;Stair training;Functional mobility training;Therapeutic activities;Therapeutic exercise;Balance training;Neuromuscular re-education;Cognitive remediation;Patient/family education;Orthotic Fit/Training;Manual techniques;Passive range of motion;Energy conservation;Taping;Splinting;Vestibular;Visual/perceptual remediation/compensation;Joint Manipulations    PT  Next Visit Plan strength, endurance, balance and gait interventions    PT Home Exercise Plan No changes to HEP this visit    Consulted and Agree with Plan of Care Patient;Family member/caregiver    Family Member Consulted sister             Patient will benefit from skilled therapeutic intervention in order to improve the following deficits and impairments:  Abnormal gait, Decreased activity tolerance, Decreased endurance, Decreased range of motion, Decreased strength, Hypomobility, Improper body mechanics, Decreased balance, Decreased coordination, Decreased mobility, Difficulty walking, Impaired flexibility, Postural dysfunction, Impaired vision/preception, Impaired sensation  Visit Diagnosis: Abnormality of gait and mobility  Difficulty in walking, not elsewhere classified  Muscle weakness (generalized)  Unsteadiness on feet  Other abnormalities of gait and mobility     Problem List 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 [place], [place], [address] Phone: [phone]   Fax:  [phone]  Name: Lara Palinkas MRN: [id number] Date of Birth: 30-Oct-1951

## 2021-09-10 NOTE — Patient Instructions (Signed)
Pt to perform HEP for R hand strength and coordination daily 30-45 min sessions.  Encouraged pt to continue to target R hand grip strengthening and pinch strengthening with use of theraputty.  Reinforced FMC activities to target object manipulation with picking up small objects on table (ie coins), coloring, handwriting practice.   Choose between variety of activities daily: Theraputty-gripping and pinching Coin manipulation activities: picking up from table, removing 1 at a time from palm of hand, stacking coins, placing in a slotted bank Coloring Handwriting

## 2021-09-13 ENCOUNTER — Ambulatory Visit: Payer: Medicare Other | Admitting: Physical Therapy

## 2021-09-13 ENCOUNTER — Other Ambulatory Visit: Payer: Self-pay

## 2021-09-13 ENCOUNTER — Ambulatory Visit: Payer: Medicare Other | Admitting: Speech Pathology

## 2021-09-13 ENCOUNTER — Ambulatory Visit: Payer: Medicare Other

## 2021-09-13 DIAGNOSIS — M6281 Muscle weakness (generalized): Secondary | ICD-10-CM

## 2021-09-13 DIAGNOSIS — R2689 Other abnormalities of gait and mobility: Secondary | ICD-10-CM

## 2021-09-13 DIAGNOSIS — R471 Dysarthria and anarthria: Secondary | ICD-10-CM

## 2021-09-13 DIAGNOSIS — R269 Unspecified abnormalities of gait and mobility: Secondary | ICD-10-CM

## 2021-09-13 DIAGNOSIS — R4701 Aphasia: Secondary | ICD-10-CM

## 2021-09-13 DIAGNOSIS — I6300 Cerebral infarction due to thrombosis of unspecified precerebral artery: Secondary | ICD-10-CM

## 2021-09-13 DIAGNOSIS — R262 Difficulty in walking, not elsewhere classified: Secondary | ICD-10-CM

## 2021-09-13 DIAGNOSIS — R2681 Unsteadiness on feet: Secondary | ICD-10-CM

## 2021-09-13 DIAGNOSIS — R41841 Cognitive communication deficit: Secondary | ICD-10-CM

## 2021-09-13 NOTE — Therapy (Signed)
[place] [facility] MAIN [facility] SERVICES [address] [place], [place], [address] Phone: [phone]   Fax:  [phone]  Physical Therapy Treatment  Patient Details  Name: Laura [name] MRN: [id number] Date of Birth: 1951-01-02 Referring Provider (PT): Carlis Abbott Drema Pry, MD   Encounter Date: 09/13/2021   PT End of Session - 09/13/21 1110     Visit Number 18    Number of Visits 25    Date for PT Re-Evaluation 09/27/21    Authorization Type Medicare (traditional)    Authorization Time Period 07/05/21-09/27/21    Progress Note Due on Visit 20    PT Start Time 1100    PT Stop Time 1142    PT Time Calculation (min) 42 min    Equipment Utilized During Treatment Gait belt    Activity Tolerance Patient tolerated treatment well    Behavior During Therapy [facility] for tasks assessed/performed             Past Medical History:  Diagnosis Date   Acute CVA (cerebrovascular accident) (HCC) 05/24/2021   Diabetes mellitus without complication (HCC) 05/23/2021   Hyperlipidemia 05/23/2021   Hypertension 05/23/2021   Left forearm fracture    in high school--was casted   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   Foot fracture, no surgery   TONSILLECTOMY      There were no vitals filed for this visit.   Subjective Assessment - 09/13/21 1101     Subjective Pt reports no falls or LOB since previous session. Pt reports a change in her medication. Pt reports she had a nice weekend. Pt reports increase in her metformin doseage per MD in hopes it may be able to decrease the insulin doseage.    Patient is accompained by: Family member    Pertinent History Pt presents to evaluation with her sister as pt has difficulty with speech. Pt reports she has not been using an AD. Pt confirms she was admitted to hospital d/t CVA on 05/23/2021. Pt sister reports  they are unsure of the onset of the stroke. Pt's sister reports she checked on her sister after she received a birthday card and noticed her sister's signature looked abnormal. She called the pt and reports her voice was slurred. When she checked on her she said she had R side facial droop, difficulty with R hand. Pt's sister reports pt initially declined going to the ED but agreed to go the following morning where she was diagnosed with a CVA. Once d/c from the hospital the pt went to rehab for a week. Pt was living alone, independently prior to her stroke, but now lives with her sister and her sister's husband. Pt's sister says her husband walks with pt 15 mins 2x/day. Pt's sister reports after pt has been walking for a while she will start to drag her L leg. She says this has improved with time. Pt now requires some assistance with ADLs, including dressing, making her bed, and self-feeding, showering. Pt's sister reports pt now has vision issues as well as balance problems. She reports pt loses balance with dressing herself. Other PMH includes: HTN, DMII, Vitamin b12 deficiency, HLD, Vitamin D deficiency. Pt also with dysphasia, aphasia, weakness d/t recent stroke.    Limitations Walking;Writing;Standing;Lifting;House hold activities;Reading    How long can you sit comfortably? not affected  How long can you stand comfortably? 15 min    How long can you walk comfortably? 15 minutes    Diagnostic tests per chart MRI brain revealed acute nonhemorrhagic infarct in L caudate had and anterior lentiform nucelus and chronic microvascular   ischemia mildly advanced for age. CTA head neck negative for AVM, aneurysms or stenosis. 2D echo showing EF 65 to 70% with borderline LVH.    Patient Stated Goals Pt would like to go home again    Currently in Pain? No/denies    Pain Score 0-No pain    Multiple Pain Sites No              Treatment provided this session   Neuro Re- Ed:   Rocker board   -laterally oriented 3 x  and A/P oriented rocker 3 x  -1st attempt steady gaze -2nd lateral head turns -3rd attempt vertical head turns  -Increased difficulty with head turns, no LOB noted.  -Performed 3 attempts with rocker board oriented to tilt laterally and 3 attempts with rocker board oriented to tilt anterior and posteriorly.  Completed in order to improve balance in both the sagittal and coronal planes   SLS progression 1 LE on airex, 1 LE on step x 30 sec ea 1 LE on airex 1 LE on 6 in step with hedgehog -no LOB with either, increased difficulty with hedgehog variation to task to decreaase BOS  Airex balance beam walk with 180 degree turns on airex pad at each end. 2reps x 3 laps  -3 instances of LOB and step correction strategy on the right side on first attempt  -On second repetition patient completed all 3 laps without loss of balance. Contact-guard assist provided with this exercise   There Act:  5 min ambulation with various tasks (dual task, direciton change, retro walking) -Difficulty with dual tasks as well as with head turns as patient unable to maintain increased gait velocity when performing these tasks  HR on FR  X20, unable to obtain full PF range of motion  To improve pushoff with ambulation in order to improve ambulation speed     Unless otherwise stated, SBA was provided and gait belt donned in order to ensure pt safety  Pt educated throughout session about proper posture and technique with exercises. Improved exercise technique, movement at target joints, use of target muscles after min to mod verbal, visual, tactile cues.                             PT Short Term Goals - 08/12/21 1402       PT SHORT TERM GOAL #1   Title Patient will be independent in home exercise program to improve strength/mobility for better functional independence with ADLs.    Baseline 07/05/2021: to be initiated next visit; 9/8: has been holding off due  to medication side effect;    Time 6    Status On-going    Target Date 08/16/21               PT Long Term Goals - 08/12/21 1358       PT LONG TERM GOAL #1   Title Patient will increase FOTO score to equal to or greater than 79 to demonstrate statistically significant improvement in mobility and quality of life.    Baseline 8/1: 70    Time 12    Period Weeks    Status On-going    Target  Date 09/27/21      PT LONG TERM GOAL #2   Title Patient will increase Berg Balance score by > 6 points to demonstrate decreased fall risk during functional activities.    Baseline 8/1: 46/56    Time 12    Period Weeks    Status On-going    Target Date 09/27/21      PT LONG TERM GOAL #3   Title The pt will ambulate at least 1700 ft during to demonstrate return toward age norm gait ability.    Baseline 8/1: 1398 ft no AD, CGA; 9/8: 1460 feet;    Time 12    Period Weeks    Status On-going    Target Date 09/27/21      PT LONG TERM GOAL #4   Title Pt to demonstrate improvement in Mini-BesTest score to 22/28 or higher to represent improved balance and reduced falls risk.    Baseline On 07/22/21: 15/28, 9/8: 18/28    Time 8    Period Weeks    Status New                   Plan - 09/13/21 1110     Clinical Impression Statement Patient tolerated treatment session well.  Progress.  All high-level balance activities including alteration in visual input with eyes closed as well as with head turns.  Patient demonstrated improved efficacy with balance beam Airex task that requires upper extremity assist but still required some correction of loss of balance via stepping strategy.  Patient continues to demonstrate improvements in high-level balance tasks as well as improvements with ambulatory capacity.  Patient does have some difficulty with ambulating with head turns and demonstrates minimal head turn vertical or horizontal with cues during ambulation.  Patient will continue to benefit  from skilled physical therapy intervention in order to improve her lower extremity strength, balance, and improve her overall ambulatory and functional abilities.    Personal Factors and Comorbidities Comorbidity 1;Comorbidity 2;Comorbidity 3+;Sex;Age    Comorbidities HTN, DMII, dysphasia, aphasia, weakness,    Examination-Activity Limitations Locomotion Level;Stairs;Dressing;Bathing;Reach Overhead;Lift;Caring for Others    Examination-Participation Restrictions Driving;Medication Management;Personal Finances;Cleaning;Meal Prep;Yard Work;Community Activity;Shop;Other;Laundry    Stability/Clinical Decision Making Evolving/Moderate complexity    Rehab Potential Good    PT Frequency 2x / week    PT Duration 12 weeks    PT Treatment/Interventions ADLs/Self Care Home Management;Biofeedback;Canalith Repostioning;Aquatic Therapy;Cryotherapy;Moist Heat;DME Instruction;Gait training;Stair training;Functional mobility training;Therapeutic activities;Therapeutic exercise;Balance training;Neuromuscular re-education;Cognitive remediation;Patient/family education;Orthotic Fit/Training;Manual techniques;Passive range of motion;Energy conservation;Taping;Splinting;Vestibular;Visual/perceptual remediation/compensation;Joint Manipulations    PT Next Visit Plan strength, endurance, balance and gait interventions    PT Home Exercise Plan No changes to HEP this visit    Consulted and Agree with Plan of Care Patient;Family member/caregiver    Family Member Consulted sister             Patient will benefit from skilled therapeutic intervention in order to improve the following deficits and impairments:  Abnormal gait, Decreased activity tolerance, Decreased endurance, Decreased range of motion, Decreased strength, Hypomobility, Improper body mechanics, Decreased balance, Decreased coordination, Decreased mobility, Difficulty walking, Impaired flexibility, Postural dysfunction, Impaired vision/preception, Impaired  sensation  Visit Diagnosis: Abnormality of gait and mobility  Difficulty in walking, not elsewhere classified  Muscle weakness (generalized)  Unsteadiness on feet  Other abnormalities of gait and mobility     Problem List Patient Active Problem List   Diagnosis Date Noted   Cerebrovascular accident (CVA) (HCC) 09/06/2021   Stenosis of right internal carotid  artery 09/06/2021   Hyperlipidemia associated with type 2 diabetes mellitus (HCC) 06/02/2021   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 [place], [place], [address] Phone: [phone]   Fax:  [phone]  Name: Laura [name] MRN: [id number] Date of Birth: [birth year]

## 2021-09-13 NOTE — Therapy (Signed)
[place] MAIN [facility] SERVICES [address] [place], [place], [address] Phone: [phone]   Fax:  [phone]  Speech Language Pathology Treatment  Patient Details  Name: Laura [name] MRN: [id number] Date of Birth: 1951-06-13 Referring Provider (SLP): Lauraine Rinne, PA-C   Encounter Date: 09/13/2021   End of Session - 09/13/21 1240     Visit Number 16    Number of Visits 25    Date for SLP Re-Evaluation 10/04/21    Authorization Type [employer]    SLP Start Time 1004    SLP Stop Time  1100    SLP Time Calculation (min) 56 min    Activity Tolerance Patient tolerated treatment well             Past Medical History:  Diagnosis Date   Acute CVA (cerebrovascular accident) ([place]) 05/24/2021   Diabetes mellitus without complication ([place]) 85/63/1497   Hyperlipidemia 05/23/2021   Hypertension 05/23/2021   Left forearm fracture    in high school--was casted   Stroke (cerebrum) ([place]) 05/26/2021   Stroke ([place]) 05/23/2021    Past Surgical History:  Procedure Laterality Date   FOOT SURGERY     FRACTURE SURGERY  ?   Foot fracture, no surgery   TONSILLECTOMY      There were no vitals filed for this visit.   Subjective Assessment - 09/13/21 1008     Subjective "Do you keep any record of my health."    Currently in Pain? No/denies                   ADULT SLP TREATMENT - 09/13/21 1234       General Information   Behavior/Cognition Alert;Cooperative    HPI Laura [name] is a 70 year old female with past medical hx noted for DM II, HLD,   admitted at [facility] on 05/23/21 with garbled speech, right facial droop, vision changes and  RUE weakness. MRI brain showed acute infarct in left caudate head and anterior lentiform nucleus, chronic microvascular ischemia. Resulting transcortical motor aphasia, dysarthria and cognitive deficits. Blood sugar was 372 on admission. Admitted to CIR 6/22- 06/02/21.      Treatment Provided    Treatment provided Cognitive-Linquistic      Cognitive-Linquistic Treatment   Treatment focused on Aphasia;Cognition    Skilled Treatment Targeted written expression; pt generated 5-6 rhyming words with occasional mod cues (ID when stuck), then wrote 2-3 rhyming sentences using words from each set. Pt decoded sentence-level questions with occasional min cues, and engaged in brief conversations 2-5 minutes with occasional mod cues for anomia and dysarthria compensations (slow rate, describe it).      Assessment / Recommendations / Plan   Plan Continue with current plan of care      Progression Toward Goals   Progression toward goals Progressing toward goals              SLP Education - 09/13/21 1239     Education Details slow rate    Person(s) Educated Patient    Methods Explanation    Comprehension Verbalized understanding              SLP Short Term Goals - 09/08/21 0820       SLP SHORT TERM GOAL #1   Title Patient will name average of 8+ items for simple /concrete categories.    Time 10    Period --   sessions   Status Partially Met    Target Date 08/18/21  SLP SHORT TERM GOAL #2   Title Patient will generate fluent, grammatical, and cogent sentence to complete concrete linguistic tasks with 80% accuracy.    Time 10    Period --   sessions   Status Achieved    Target Date 08/18/21      SLP SHORT TERM GOAL #3   Title The patient will decode and read aloud sentences of varying length and complexity with 80% accuracy.    Time 10    Period --   sessions   Status Achieved    Target Date 08/18/21      SLP SHORT TERM GOAL #4   Title Patient will complete standardized assessment of cognitive communication skills.    Time 2    Period Weeks    Status Achieved    Target Date 07/21/21      SLP SHORT TERM GOAL #5   Title Patient will write logical/appropriate sentences of 8-10 words 80% accuracy with min cues for double checking.    Time 10    Period --    sessions   Status New      SLP SHORT TERM GOAL #6   Title Patient will use compensations for aphasia/dysarthria in 5 min conversation with occasional min cues.    Time 10    Period --   sessions   Status New      SLP SHORT TERM GOAL #7   Title Patient will identify when she is perseverating and choose an alternative (break, use a strategy), 75% of the time with min verbal cue.    Time 10    Period --   sessions   Status New              SLP Long Term Goals - 08/19/21 1706       SLP LONG TERM GOAL #1   Title Patient will use compensations for aphasia and dysarthria in 10 minutes moderately complex conversation to express thoughts and opinions with listener comprehension at least 90%.    Time 12    Period Weeks    Status On-going      SLP LONG TERM GOAL #2   Title Patient will write or verbally sequence steps to complete simple to mod complex personally relevant tasks (ADLs, IADLs).    Time 12    Period Weeks    Status On-going      SLP LONG TERM GOAL #3   Title Pt will read paragraph-level materials relating to personal interests, financial and medical matters demonstrating comprehension of details >90% accuracy.    Time 12    Period Weeks    Status On-going              Plan - 09/13/21 1240     Clinical Impression Statement [name] presents with mild anomic aphasia, mild dysarthria, and mild-moderate cognitive deficits s/p CVA. Pt requires cues/prompting for engagement in conversation; while aphasia is a limiting factor, suspect pt social preferences also contributes (lived alone for many years without much social interaction). Pt was more interactive and initiated some exchanges when topics of interest discussed. I recommend skilled ST to address aphasia, dysarthria, and cognitive-communication deficits in order to improve ability to communicate wants and needs and increase pt independence, as pt goal is to return to living independently.    Speech Therapy  Frequency 2x / week    Duration 12 weeks    Treatment/Interventions Language facilitation;Environmental controls;Cueing hierarchy;SLP instruction and feedback;Compensatory techniques;Cognitive reorganization;Functional tasks;Compensatory strategies;Internal/external aids;Multimodal communcation  approach;Patient/family education    Potential to Achieve Goals Good    SLP Home Exercise Plan TBD    Consulted and Agree with Plan of Care Patient;Family member/caregiver             Patient will benefit from skilled therapeutic intervention in order to improve the following deficits and impairments:   Aphasia  Dysarthria and anarthria  Cognitive communication deficit  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 artery [facility])    Problem List Patient Active Problem List   Diagnosis Date Noted   Cerebrovascular accident (CVA) ([place]) 09/06/2021   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/06/2021   Hyperlipidemia associated with type 2 diabetes mellitus ([place]) 06/02/2021   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) 06/02/2021   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 (CVA) 06/02/2021   Vitamin D deficiency 06/02/2021   Aphasia due to acute stroke ([place]) 06/02/2021   Stroke (cerebrum) ([place]) 05/26/2021   Type II diabetes mellitus with complication ([place])    Vitamin B12 deficiency    Essential hypertension 05/23/2021   Eyesight diminished 12/05/2020   Diminished hearing 12/05/2020   Deneise Lever, [place], CCC-SLP Speech-Language Pathologist  Aliene Altes 09/13/2021, 12:41 PM  [place] [address] [place], [place], [address] Phone: [phone]   Fax:  [phone]   Name: Jesalyn Finazzo MRN: [id number] Date of Birth: [birth year]

## 2021-09-16 ENCOUNTER — Other Ambulatory Visit: Payer: Self-pay

## 2021-09-16 ENCOUNTER — Ambulatory Visit: Payer: Medicare Other | Admitting: Speech Pathology

## 2021-09-16 ENCOUNTER — Ambulatory Visit: Payer: Medicare Other

## 2021-09-16 DIAGNOSIS — R4701 Aphasia: Secondary | ICD-10-CM

## 2021-09-16 DIAGNOSIS — R471 Dysarthria and anarthria: Secondary | ICD-10-CM

## 2021-09-16 DIAGNOSIS — M6281 Muscle weakness (generalized): Secondary | ICD-10-CM | POA: Diagnosis not present

## 2021-09-16 DIAGNOSIS — R41841 Cognitive communication deficit: Secondary | ICD-10-CM

## 2021-09-16 DIAGNOSIS — R2681 Unsteadiness on feet: Secondary | ICD-10-CM

## 2021-09-16 NOTE — Therapy (Signed)
[place] MAIN [facility] SERVICES [address] [place], [place], [address] Phone: [phone]   Fax:  [phone]  Speech Language Pathology Treatment  Patient Details  Name: Laura [name] MRN: [id number] Date of Birth: [birth year] Referring Provider (SLP): Lauraine Rinne, PA-C   Encounter Date: 09/16/2021   End of Session - 09/16/21 1354     Visit Number 17    Number of Visits 25    Date for SLP Re-Evaluation 10/04/21    Authorization Type [employer]    SLP Start Time 0900    SLP Stop Time  1000    SLP Time Calculation (min) 60 min    Activity Tolerance Patient tolerated treatment well             Past Medical History:  Diagnosis Date   Acute CVA (cerebrovascular accident) ([place]) 05/24/2021   Diabetes mellitus without complication ([place]) 23/55/7322   Hyperlipidemia 05/23/2021   Hypertension 05/23/2021   Left forearm fracture    in high school--was casted   Stroke (cerebrum) ([place]) 05/26/2021   Stroke ([place]) 05/23/2021    Past Surgical History:  Procedure Laterality Date   FOOT SURGERY     FRACTURE SURGERY  ?   Foot fracture, no surgery   TONSILLECTOMY      There were no vitals filed for this visit.   Subjective Assessment - 09/16/21 1305     Subjective "It wasn't too much fun," re: rhymes    Patient is accompained by: Family member   sister   Currently in Pain? No/denies                   ADULT SLP TREATMENT - 09/16/21 1306       General Information   Behavior/Cognition Alert;Cooperative    HPI Laura [name] is a 70 year old female with past medical hx noted for DM II, HLD,   admitted at [facility] on 05/23/21 with garbled speech, right facial droop, vision changes and  RUE weakness. MRI brain showed acute infarct in left caudate head and anterior lentiform nucleus, chronic microvascular ischemia. Resulting transcortical motor aphasia, dysarthria and cognitive deficits. Blood sugar was 372 on admission. Admitted to  [facility] 6/22- 06/02/21.      Treatment Provided   Treatment provided Cognitive-Linquistic      Cognitive-Linquistic Treatment   Treatment focused on Aphasia;Cognition    Skilled Treatment Reviewed homework; pt had difficulty with generating rhyming words with different spellings (phonological awareness). ID'd rhymes from written list of 4 words with max cues. Pt ID'd rhyming words 95% accuracy; generated 3-5  additional rhyming words with occasional mod cues. At times pt had difficulty recalling word with a short delay; targeted delayed recall of 2 words (100% accuracy, with repetition of words due to hearing loss), and 4 words (90% accuracy).      Assessment / Recommendations / Plan   Plan Continue with current plan of care      Progression Toward Goals   Progression toward goals Progressing toward goals              SLP Education - 09/16/21 1353     Education Details delayed recall strategies    Person(s) Educated Patient    Methods Explanation    Comprehension Verbalized understanding              SLP Short Term Goals - 09/08/21 0820       SLP SHORT TERM GOAL #1   Title Patient will name average of  8+ items for simple /concrete categories.    Time 10    Period --   sessions   Status Partially Met    Target Date 08/18/21      SLP SHORT TERM GOAL #2   Title Patient will generate fluent, grammatical, and cogent sentence to complete concrete linguistic tasks with 80% accuracy.    Time 10    Period --   sessions   Status Achieved    Target Date 08/18/21      SLP SHORT TERM GOAL #3   Title The patient will decode and read aloud sentences of varying length and complexity with 80% accuracy.    Time 10    Period --   sessions   Status Achieved    Target Date 08/18/21      SLP SHORT TERM GOAL #4   Title Patient will complete standardized assessment of cognitive communication skills.    Time 2    Period Weeks    Status Achieved    Target Date 07/21/21      SLP  SHORT TERM GOAL #5   Title Patient will write logical/appropriate sentences of 8-10 words 80% accuracy with min cues for double checking.    Time 10    Period --   sessions   Status New      SLP SHORT TERM GOAL #6   Title Patient will use compensations for aphasia/dysarthria in 5 min conversation with occasional min cues.    Time 10    Period --   sessions   Status New      SLP SHORT TERM GOAL #7   Title Patient will identify when she is perseverating and choose an alternative (break, use a strategy), 75% of the time with min verbal cue.    Time 10    Period --   sessions   Status New              SLP Long Term Goals - 08/19/21 1706       SLP LONG TERM GOAL #1   Title Patient will use compensations for aphasia and dysarthria in 10 minutes moderately complex conversation to express thoughts and opinions with listener comprehension at least 90%.    Time 12    Period Weeks    Status On-going      SLP LONG TERM GOAL #2   Title Patient will write or verbally sequence steps to complete simple to mod complex personally relevant tasks (ADLs, IADLs).    Time 12    Period Weeks    Status On-going      SLP LONG TERM GOAL #3   Title Pt will read paragraph-level materials relating to personal interests, financial and medical matters demonstrating comprehension of details >90% accuracy.    Time 12    Period Weeks    Status On-going              Plan - 09/16/21 1354     Clinical Impression Statement Laura [name] presents with mild anomic aphasia, mild dysarthria, and mild-moderate cognitive deficits s/p CVA. Pt requires cues/prompting for engagement in conversation; while aphasia is a limiting factor, suspect pt social preferences also contributes (lived alone for many years without much social interaction). Deficits in delayed recall noted today. I recommend skilled ST to address aphasia, dysarthria, and cognitive-communication deficits in order to improve ability to  communicate wants and needs and increase pt independence, as pt goal is to return to living independently.    Speech  Therapy Frequency 2x / week    Duration 12 weeks    Treatment/Interventions Language facilitation;Environmental controls;Cueing hierarchy;SLP instruction and feedback;Compensatory techniques;Cognitive reorganization;Functional tasks;Compensatory strategies;Internal/external aids;Multimodal communcation approach;Patient/family education    Potential to Achieve Goals Good    SLP Home Exercise Plan TBD    Consulted and Agree with Plan of Care Patient;Family member/caregiver             Patient will benefit from skilled therapeutic intervention in order to improve the following deficits and impairments:   Aphasia  Dysarthria and anarthria  Cognitive communication deficit    Problem List Patient Active Problem List   Diagnosis Date Noted   Cerebrovascular accident (CVA) ([place]) 09/06/2021   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/06/2021   Hyperlipidemia associated with type 2 diabetes mellitus ([place]) 06/02/2021   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) 06/02/2021   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 (CVA) 06/02/2021   Vitamin D deficiency 06/02/2021   Aphasia due to acute stroke ([place]) 06/02/2021   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

## 2021-09-16 NOTE — Therapy (Signed)
[facility] MAIN [facility] SERVICES [address] [place], [place], [address] Phone: [phone]   Fax:  [phone]  Physical Therapy Treatment  Patient Details  Name: Laura [name] MRN: [id number] Date of Birth: 1951/02/13 Referring Provider (PT): Carlis Abbott Drema Pry, MD   Encounter Date: 09/16/2021   PT End of Session - 09/16/21 1053     Visit Number 19    Number of Visits 25    Date for PT Re-Evaluation 09/27/21    Authorization Type Medicare (traditional)    Authorization Time Period 07/05/21-09/27/21    Progress Note Due on Visit 20    PT Start Time 1006    PT Stop Time 1047    PT Time Calculation (min) 41 min    Equipment Utilized During Treatment Gait belt    Activity Tolerance Patient tolerated treatment well    Behavior During Therapy [facility] for tasks assessed/performed             Past Medical History:  Diagnosis Date   Acute CVA (cerebrovascular accident) ([facility]) 05/24/2021   Diabetes mellitus without complication ([facility]) 05/23/2021   Hyperlipidemia 05/23/2021   Hypertension 05/23/2021   Left forearm fracture    in high school--was casted   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   Foot fracture, no surgery   TONSILLECTOMY      There were no vitals filed for this visit.   Subjective Assessment - 09/16/21 1006     Subjective Pt recently seen by dr and reports they have increased dose of Metformin. Pt waiting for [place] on carotid artery before doing cataract surgery. Pt is trying to get referred to audiologist as well for hearing.    Patient is accompained by: Family member    Pertinent History Pt presents to evaluation with her sister as pt has difficulty with speech. Pt reports she has not been using an AD. Pt confirms she was admitted to hospital d/t CVA on 05/23/2021. Pt sister reports they are unsure of the onset  of the stroke. Pt's sister reports she checked on her sister after she received a birthday card and noticed her sister's signature looked abnormal. She called the pt and reports her voice was slurred. When she checked on her she said she had R side facial droop, difficulty with R hand. Pt's sister reports pt initially declined going to the ED but agreed to go the following morning where she was diagnosed with a CVA. Once d/c from the hospital the pt went to rehab for a week. Pt was living alone, independently prior to her stroke, but now lives with her sister and her sister's husband. Pt's sister says her husband walks with pt 15 mins 2x/day. Pt's sister reports after pt has been walking for a while she will start to drag her L leg. She says this has improved with time. Pt now requires some assistance with ADLs, including dressing, making her bed, and self-feeding, showering. Pt's sister reports pt now has vision issues as well as balance problems. She reports pt loses balance with dressing herself. Other PMH includes: HTN, DMII, Vitamin b12 deficiency, HLD, Vitamin D deficiency. Pt also with dysphasia, aphasia, weakness d/t recent stroke.    Limitations Walking;Writing;Standing;Lifting;House hold activities;Reading    How long can you sit comfortably? not affected    How long can you stand comfortably? 15  min    How long can you walk comfortably? 15 minutes    Diagnostic tests per chart MRI brain revealed acute nonhemorrhagic infarct in L caudate had and anterior lentiform nucelus and chronic microvascular   ischemia mildly advanced for age. CTA head neck negative for AVM, aneurysms or stenosis. 2D echo showing EF 65 to 70% with borderline LVH.    Patient Stated Goals Pt would like to go home again    Currently in Pain? No/denies           INTERVENTIONS  Neuro Re- Ed:   Performed at support surface - CGA-min a throughout    [employer] - Pt initially instructed to maintain upright posture and  still standing on rocker board x 60 sec.  -Pt then instructed to perform ankle rocker for DF/PF to promote use of ankle strategies.  -Pt then performs still standing on rocker board with horizontal and vertical head turns.  -progressed to performing ankle rockers forward/backward with dual cognitive task (counting up).  Comments: while pt has some difficulty with dual cog task, overall with slight decrease in postural stability but no LOB, does not require UE support.   SLB x multiple bouts 20-30 sec each LE, cuing for upright posture. --progressed to dual cognitive task involving counting up. Pt steadies with reps and able to maintain balance for almost 30 sec each LE.  Otherwise pt uses intermittent toe touch and UE support for initial reps.   One cone and two balance pods placed in front of patient. Pt taps cone or balance pods in various sequences with R and LLE. PT calls out which LE to use and sequence to use.  Pt requires intermittent UE support/toe-touch to regain balance.   1 LE on airex pad and  1LE on 6" step (SLB progression) -- Patient initially perform static stand for 2 x 30 seconds each lower extremity -- Intervention then progressed to being performed with horizontal and vertical head turns, 10 times for each in each direction -- Further progression with addition of dual cognitive task of naming foods and counting for multiple repetitions    Airex balance beam walk with 180 degree turns on airex pad at each end -10 times -progressed to Airex beam walk with dual cognitive task such as counting   Pt educated throughout session about proper posture and technique with exercises. Improved exercise technique, movement at target joints, use of target muscles after min to mod verbal, visual, tactile cues.  note: Portions of this document were prepared using Dragon voice recognition software and although reviewed may contain unintentional dictation errors in syntax, grammar, or  spelling.     PT Education - 09/16/21 1052     Education Details exercise technique, body mechanics    Person(s) Educated Patient    Methods Explanation;Demonstration;Verbal cues    Comprehension Verbalized understanding;Returned demonstration;Verbal cues required;Need further instruction              PT Short Term Goals - 08/12/21 1402       PT SHORT TERM GOAL #1   Title Patient will be independent in home exercise program to improve strength/mobility for better functional independence with ADLs.    Baseline 07/05/2021: to be initiated next visit; 9/8: has been holding off due to medication side effect;    Time 6    Status On-going    Target Date 08/16/21               PT Long Term Goals -  08/12/21 1358       PT LONG TERM GOAL #1   Title Patient will increase FOTO score to equal to or greater than 79 to demonstrate statistically significant improvement in mobility and quality of life.    Baseline 8/1: 70    Time 12    Period Weeks    Status On-going    Target Date 09/27/21      PT LONG TERM GOAL #2   Title Patient will increase Berg Balance score by > 6 points to demonstrate decreased fall risk during functional activities.    Baseline 8/1: 46/56    Time 12    Period Weeks    Status On-going    Target Date 09/27/21      PT LONG TERM GOAL #3   Title The pt will ambulate at least 1700 ft during to demonstrate return toward age norm gait ability.    Baseline 8/1: 1398 ft no AD, CGA; 9/8: 1460 feet;    Time 12    Period Weeks    Status On-going    Target Date 09/27/21      PT LONG TERM GOAL #4   Title Pt to demonstrate improvement in Mini-BesTest score to 22/28 or higher to represent improved balance and reduced falls risk.    Baseline On 07/22/21: 15/28, 9/8: 18/28    Time 8    Period Weeks    Status New                   Plan - 09/16/21 1053     Clinical Impression Statement Pt highly motivated to participate in session. Continued  focus on higher level balance exercises, where pt was able to progress majority of exercises to involving dual cognitive task. Pt required CGA-min a throughout and intermittent UE support with SLB tasks and airex beam walking. Pt exhibits some difficulty sustaining dual task. The pt will benefit from further skilled PT to continue to improve LE strength, balance, and mobility.    Personal Factors and Comorbidities Comorbidity 1;Comorbidity 2;Comorbidity 3+;Sex;Age    Comorbidities HTN, DMII, dysphasia, aphasia, weakness,    Examination-Activity Limitations Locomotion Level;Stairs;Dressing;Bathing;Reach Overhead;Lift;Caring for Others    Examination-Participation Restrictions Driving;Medication Management;Personal Finances;Cleaning;Meal Prep;Yard Work;Community Activity;Shop;Other;Laundry    Stability/Clinical Decision Making Evolving/Moderate complexity    Rehab Potential Good    PT Frequency 2x / week    PT Duration 12 weeks    PT Treatment/Interventions ADLs/Self Care Home Management;Biofeedback;Canalith Repostioning;Aquatic Therapy;Cryotherapy;Moist Heat;DME Instruction;Gait training;Stair training;Functional mobility training;Therapeutic activities;Therapeutic exercise;Balance training;Neuromuscular re-education;Cognitive remediation;Patient/family education;Orthotic Fit/Training;Manual techniques;Passive range of motion;Energy conservation;Taping;Splinting;Vestibular;Visual/perceptual remediation/compensation;Joint Manipulations    PT Next Visit Plan strength, endurance, balance and gait interventions    PT Home Exercise Plan No changes to HEP this visit    Consulted and Agree with Plan of Care Patient;Family member/caregiver    Family Member Consulted sister             Patient will benefit from skilled therapeutic intervention in order to improve the following deficits and impairments:  Abnormal gait, Decreased activity tolerance, Decreased endurance, Decreased range of motion, Decreased  strength, Hypomobility, Improper body mechanics, Decreased balance, Decreased coordination, Decreased mobility, Difficulty walking, Impaired flexibility, Postural dysfunction, Impaired vision/preception, Impaired sensation  Visit Diagnosis: Unsteadiness on feet     Problem List Patient Active Problem List   Diagnosis Date Noted   Cerebrovascular accident (CVA) ([facility]) 09/06/2021   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/06/2021   Hyperlipidemia associated with type 2 diabetes mellitus ([facility]) 06/02/2021  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 [place], [place], [address] Phone: [phone]   Fax:  [phone]  Name: Laura [name] MRN: [id number] Date of Birth: December 18, 1950

## 2021-09-20 ENCOUNTER — Ambulatory Visit: Payer: Medicare Other | Admitting: Speech Pathology

## 2021-09-20 ENCOUNTER — Ambulatory Visit: Payer: Medicare Other

## 2021-09-20 ENCOUNTER — Other Ambulatory Visit: Payer: Self-pay

## 2021-09-20 DIAGNOSIS — M6281 Muscle weakness (generalized): Secondary | ICD-10-CM | POA: Diagnosis not present

## 2021-09-20 DIAGNOSIS — R471 Dysarthria and anarthria: Secondary | ICD-10-CM

## 2021-09-20 DIAGNOSIS — R2681 Unsteadiness on feet: Secondary | ICD-10-CM

## 2021-09-20 DIAGNOSIS — R269 Unspecified abnormalities of gait and mobility: Secondary | ICD-10-CM

## 2021-09-20 DIAGNOSIS — R41841 Cognitive communication deficit: Secondary | ICD-10-CM

## 2021-09-20 DIAGNOSIS — R262 Difficulty in walking, not elsewhere classified: Secondary | ICD-10-CM

## 2021-09-20 DIAGNOSIS — R4701 Aphasia: Secondary | ICD-10-CM

## 2021-09-20 NOTE — Therapy (Signed)
[facility] MAIN [facility] SERVICES [address] [place], [place], [address] Phone: [phone]   Fax:  [phone]  Physical Therapy Treatment Reassessment and DC  Reporting Dates: 08/12/21 through 09/20/21  Patient Details  Name: Laura [name] MRN: [id number] Date of Birth: July 12, 1951 Referring Provider (PT): Horton Chin, MD   Encounter Date: 09/20/2021   PT End of Session - 09/20/21 0922     Visit Number 20    Number of Visits 25    Date for PT Re-Evaluation 09/27/21    Authorization Type Medicare (traditional)    Authorization Time Period 07/05/21-09/27/21    Progress Note Due on Visit 30    PT Start Time 0903    PT Stop Time 0943    PT Time Calculation (min) 40 min    Activity Tolerance Patient tolerated treatment well;No increased pain    Behavior During Therapy [facility] for tasks assessed/performed             Past Medical History:  Diagnosis Date   Acute CVA (cerebrovascular accident) (HCC) 05/24/2021   Diabetes mellitus without complication (HCC) 05/23/2021   Hyperlipidemia 05/23/2021   Hypertension 05/23/2021   Left forearm fracture    in high school--was casted   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   Foot fracture, no surgery   TONSILLECTOMY      There were no vitals filed for this visit.   Subjective Assessment - 09/20/21 0921     Subjective Pt doing well in general, no significant updates since prior PT visit.    Pertinent History Pt presents to evaluation with her sister as pt has difficulty with speech. Pt reports she has not been using an AD. Pt confirms she was admitted to hospital d/t CVA on 05/23/2021. Pt sister reports they are unsure of the onset of the stroke. Pt's sister reports she checked on her sister after she received a birthday card and noticed her sister's signature looked abnormal.  She called the pt and reports her voice was slurred. When she checked on her she said she had R side facial droop, difficulty with R hand. Pt's sister reports pt initially declined going to the ED but agreed to go the following morning where she was diagnosed with a CVA. Once d/c from the hospital the pt went to rehab for a week. Pt was living alone, independently prior to her stroke, but now lives with her sister and her sister's husband. Pt's sister says her husband walks with pt 15 mins 2x/day. Pt's sister reports after pt has been walking for a while she will start to drag her L leg. She says this has improved with time. Pt now requires some assistance with ADLs, including dressing, making her bed, and self-feeding, showering. Pt's sister reports pt now has vision issues as well as balance problems. She reports pt loses balance with dressing herself. Other PMH includes: HTN, DMII, Vitamin b12 deficiency, HLD, Vitamin D deficiency. Pt also with dysphasia, aphasia, weakness d/t recent stroke.    How long can you sit comfortably? not affected    How long can you stand comfortably? 15 min    How long can you walk comfortably? 15 minutes    Diagnostic tests per chart MRI brain revealed acute nonhemorrhagic infarct in L caudate had and anterior lentiform nucelus and chronic microvascular  ischemia mildly advanced for age. CTA head neck negative for AVM, aneurysms or stenosis. 2D echo showing EF 65 to 70% with borderline LVH.    Currently in Pain? No/denies                [facility] PT Assessment - 09/20/21 0001       Observation/Other Assessments   Focus on Therapeutic Outcomes (FOTO)  72      Berg Balance Test   Sit to Stand Able to stand without using hands and stabilize independently    Standing Unsupported Able to stand safely 2 minutes    Sitting with Back Unsupported but Feet Supported on Floor or Stool Able to sit safely and securely 2 minutes    Stand to Sit Sits safely with minimal use of  hands    Transfers Able to transfer safely, minor use of hands    Standing Unsupported with Eyes Closed Able to stand 10 seconds safely    Standing Unsupported with Feet Together Able to place feet together independently and stand 1 minute safely    From Standing, Reach Forward with Outstretched Arm Can reach confidently >25 cm (10")    From Standing Position, Pick up Object from Floor Able to pick up shoe safely and easily    From Standing Position, Turn to Look Behind Over each Shoulder Looks behind from both sides and weight shifts well    Turn 360 Degrees Able to turn 360 degrees safely in 4 seconds or less    Standing Unsupported, Alternately Place Feet on Step/Stool Able to stand independently and safely and complete 8 steps in 20 seconds    Standing Unsupported, One Foot in Front Able to take small step independently and hold 30 seconds    Standing on One Leg Able to lift leg independently and hold equal to or more than 3 seconds    Total Score 52      Mini-BESTest   Sit To Stand Normal: Comes to stand without use of hands and stabilizes independently.    Rise to Toes Normal: Stable for 3 s with maximum height.    Stand on one leg (left) Moderate: < 20 s    Stand on one leg (right) Moderate: < 20 s    Stand on one leg - lowest score 1    Compensatory Stepping Correction - Forward Moderate: More than one step is required to recover equilibrium    Compensatory Stepping Correction - Backward Moderate: More than one step is required to recover equilibrium    Compensatory Stepping Correction - Left Lateral Normal: Recovers independently with 1 step (crossover or lateral OK)    Compensatory Stepping Correction - Right Lateral Normal: Recovers independently with 1 step (crossover or lateral OK)    Stepping Corredtion Lateral - lowest score 2    Stance - Feet together, eyes open, firm surface  Normal: 30s    Stance - Feet together, eyes closed, foam surface  Normal: 30s    Incline - Eyes  Closed Normal: Stands independently 30s and aligns with gravity    Change in Gait Speed Normal: Significantly changes walkling speed without imbalance    Walk with head turns - Horizontal Normal: performs head turns with no change in gait speed and good balance    Walk with pivot turns Normal: Turns with feet close FAST (< 3 steps) with good balance.    Step over obstacles Normal: Able to step over box with minimal change of gait speed and with  good balance.    Timed UP & GO with Dual Task Moderate: Dual Task affects either counting OR walking (>10%) when compared to the TUG without Dual Task.   TUG: 8.28sec; backward couting from 20 by twos; CTUG: 8.66 (count error after midpoint   Mini-BEST total score 24                : 169ft, no device TUG: 8.6sec CTUG: 8.6sec, loss of cog task at turn (count down from 20 by 2s)           PT Education - 09/20/21 1347     Education Details Going forward continue with fitness walking for improved overall function    Person(s) Educated Patient    Methods Explanation    Comprehension Verbalized understanding;Returned demonstration              PT Short Term Goals - 08/12/21 1402       PT SHORT TERM GOAL #1   Title Patient will be independent in home exercise program to improve strength/mobility for better functional independence with ADLs.    Baseline 07/05/2021: to be initiated next visit; 9/8: has been holding off due to medication side effect;    Time 6    Status On-going    Target Date 08/16/21               PT Long Term Goals - 09/20/21 0923       PT LONG TERM GOAL #1   Title Patient will increase FOTO score to equal to or greater than 79 to demonstrate statistically significant improvement in mobility and quality of life.    Baseline 8/1: 70; 10/17: 72    Time 12    Period Weeks    Status On-going    Target Date 09/27/21      PT LONG TERM GOAL #2   Title Patient will increase Berg Balance score by > 6  points to demonstrate decreased fall risk during functional activities.    Baseline 8/1: 46/56; 10/17: 52/56   Time 12    Period Weeks    Status On-going    Target Date 09/27/21      PT LONG TERM GOAL #3   Title The pt will ambulate at least 1700 ft during to demonstrate return toward age norm gait ability.    Baseline 8/1: 1398 ft no AD, CGA; 9/8: 1460 feet; 10/17: 163ft no device    Time 12    Period Weeks    Status On-going    Target Date 09/27/21      PT LONG TERM GOAL #4   Title Pt to demonstrate improvement in Mini-BesTest score to 22/28 or higher to represent improved balance and reduced falls risk.    Baseline On 07/22/21: 15/28, 9/8: 18/28; 10/17: 24/28    Time 8    Period Weeks    Status Achieved    Target Date 09/16/21                   Plan - 09/20/21 1349     Clinical Impression Statement Reassessment this date on visit 20, pt showing excellent improvement on , Berg Balance Test flat compared to prior assessment, and MiniBesTest showing excellent improvement beyond goal. Pt also showing less hemineglect tendencies. FOTO improved to 72. Pt feels she is ready for DC. Author is supportive of this given her progress and independence. Pt encouraged to continue with fitness walking going forward.    Personal Factors and  Comorbidities Comorbidity 1;Comorbidity 2;Comorbidity 3+;Sex;Age    Comorbidities HTN, DMII, dysphasia, aphasia, weakness,    Examination-Activity Limitations Locomotion Level;Stairs;Dressing;Bathing;Reach Overhead;Lift;Caring for Others    Examination-Participation Restrictions Driving;Medication Management;Personal Finances;Cleaning;Meal Prep;Yard Work;Community Activity;Shop;Other;Laundry    Stability/Clinical Decision Making Evolving/Moderate complexity    Clinical Decision Making Moderate    Rehab Potential Good    PT Frequency 2x / week    PT Duration 12 weeks    PT Treatment/Interventions ADLs/Self Care Home  Management;Biofeedback;Canalith Repostioning;Aquatic Therapy;Cryotherapy;Moist Heat;DME Instruction;Gait training;Stair training;Functional mobility training;Therapeutic activities;Therapeutic exercise;Balance training;Neuromuscular re-education;Cognitive remediation;Patient/family education;Orthotic Fit/Training;Manual techniques;Passive range of motion;Energy conservation;Taping;Splinting;Vestibular;Visual/perceptual remediation/compensation;Joint Manipulations    PT Next Visit Plan N/A    PT Home Exercise Plan N/A    Consulted and Agree with Plan of Care Patient;Family member/caregiver    Family Member Consulted sister             Patient will benefit from skilled therapeutic intervention in order to improve the following deficits and impairments:  Abnormal gait, Decreased activity tolerance, Decreased endurance, Decreased range of motion, Decreased strength, Hypomobility, Improper body mechanics, Decreased balance, Decreased coordination, Decreased mobility, Difficulty walking, Impaired flexibility, Postural dysfunction, Impaired vision/preception, Impaired sensation  Visit Diagnosis: Unsteadiness on feet  Abnormality of gait and mobility  Difficulty in walking, not elsewhere classified  Muscle weakness (generalized)     Problem List 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) 06/02/2021   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 (CVA) 06/02/2021   Vitamin D deficiency 06/02/2021   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, [place], [address] Phone: [phone]   Fax:  [phone]  Name: Laura [name] MRN: [id number] Date of Birth: 1950-12-14

## 2021-09-20 NOTE — Patient Instructions (Signed)
Homework:  Listen to an article on [employer] OR a short Ted Talk.   [email or web address]  [employer]Search[employer]be (you can search for a topic of interest, like "dogs"  Write 1 or 2 sentences (something you learned or something you thought was interesting).

## 2021-09-20 NOTE — Therapy (Signed)
[place] MAIN [facility] SERVICES [address] [place], [place], [address] Phone: [phone]   Fax:  [phone]  Speech Language Pathology Treatment  Patient Details  Name: Laura [name] MRN: [id number] Date of Birth: [birth year] Referring Provider (SLP): Lauraine Rinne, PA-C   Encounter Date: 09/20/2021   End of Session - 09/20/21 1259     Visit Number 18    Number of Visits 25    Date for SLP Re-Evaluation 10/04/21    Authorization Type [employer]    SLP Start Time 1000    SLP Stop Time  1100    SLP Time Calculation (min) 60 min    Activity Tolerance Patient tolerated treatment well             Past Medical History:  Diagnosis Date   Acute CVA (cerebrovascular accident) ([place]) 05/24/2021   Diabetes mellitus without complication ([place]) 85/27/7824   Hyperlipidemia 05/23/2021   Hypertension 05/23/2021   Left forearm fracture    in high school--was casted   Stroke (cerebrum) ([place]) 05/26/2021   Stroke ([place]) 05/23/2021    Past Surgical History:  Procedure Laterality Date   FOOT SURGERY     FRACTURE SURGERY  ?   Foot fracture, no surgery   TONSILLECTOMY      There were no vitals filed for this visit.   Subjective Assessment - 09/20/21 1231     Subjective "I graduated." (from PT)    Patient is accompained by: Family member   sister Laura [name]   Currently in Pain? No/denies                   ADULT SLP TREATMENT - 09/20/21 1231       General Information   Behavior/Cognition Alert;Cooperative    HPI Laura [name] is a 70 year old female with past medical hx noted for DM II, HLD,   admitted at [facility] on 05/23/21 with garbled speech, right facial droop, vision changes and  RUE weakness. MRI brain showed acute infarct in left caudate head and anterior lentiform nucleus, chronic microvascular ischemia. Resulting transcortical motor aphasia, dysarthria and cognitive deficits. Blood sugar was 372 on admission. Admitted to CIR  6/22- 06/02/21.      Treatment Provided   Treatment provided Cognitive-Linquistic      Cognitive-Linquistic Treatment   Treatment focused on Aphasia;Cognition    Skilled Treatment Reviewed pt's generated rhymes. Initial accuracy ID'ing incorrect rhymes <50%. With visual and verbal cues, pt placed new words into the correct rhyming category 80% accuracy. Discussed plan of care and goals; pt with limited insight into important "communication scenarios," but did generate medications as a topic she would need to communicate about if living alone. Max cues to ID other potential categories such as emergency/safety needs and home repair/maintenance. Pt listened to [employer] article and answered basic questions 3/3 accuracy. Mod-max cues to generate more appropriate responses (written sentences) to more specific and also open-ended questions.      Assessment / Recommendations / Plan   Plan Continue with current plan of care      Progression Toward Goals   Progression toward goals Progressing toward goals              SLP Education - 09/20/21 1258     Education Details task difficulty is chosen to promote learning/neuroplasticity    Person(s) Educated Patient    Methods Explanation    Comprehension Verbalized understanding  SLP Short Term Goals - 09/08/21 0820       SLP SHORT TERM GOAL #1   Title Patient will name average of 8+ items for simple /concrete categories.    Time 10    Period --   sessions   Status Partially Met    Target Date 08/18/21      SLP SHORT TERM GOAL #2   Title Patient will generate fluent, grammatical, and cogent sentence to complete concrete linguistic tasks with 80% accuracy.    Time 10    Period --   sessions   Status Achieved    Target Date 08/18/21      SLP SHORT TERM GOAL #3   Title The patient will decode and read aloud sentences of varying length and complexity with 80% accuracy.    Time 10    Period --   sessions   Status  Achieved    Target Date 08/18/21      SLP SHORT TERM GOAL #4   Title Patient will complete standardized assessment of cognitive communication skills.    Time 2    Period Weeks    Status Achieved    Target Date 07/21/21      SLP SHORT TERM GOAL #5   Title Patient will write logical/appropriate sentences of 8-10 words 80% accuracy with min cues for double checking.    Time 10    Period --   sessions   Status New      SLP SHORT TERM GOAL #6   Title Patient will use compensations for aphasia/dysarthria in 5 min conversation with occasional min cues.    Time 10    Period --   sessions   Status New      SLP SHORT TERM GOAL #7   Title Patient will identify when she is perseverating and choose an alternative (break, use a strategy), 75% of the time with min verbal cue.    Time 10    Period --   sessions   Status New              SLP Long Term Goals - 08/19/21 1706       SLP LONG TERM GOAL #1   Title Patient will use compensations for aphasia and dysarthria in 10 minutes moderately complex conversation to express thoughts and opinions with listener comprehension at least 90%.    Time 12    Period Weeks    Status On-going      SLP LONG TERM GOAL #2   Title Patient will write or verbally sequence steps to complete simple to mod complex personally relevant tasks (ADLs, IADLs).    Time 12    Period Weeks    Status On-going      SLP LONG TERM GOAL #3   Title Pt will read paragraph-level materials relating to personal interests, financial and medical matters demonstrating comprehension of details >90% accuracy.    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 Pt requires cues/prompting for engagement in conversation; while aphasia is a limiting factor, suspect pt social preferences also  contributes (lived alone for many years without much social interaction). At end of session today pt's sister reported some barriers to pt returning to living alone, concerns about  pt being able to maintain a safe living environment. I recommend skilled ST to address aphasia, dysarthria, and cognitive-communication deficits in order to improve ability to communicate wants and needs and increase pt independence, as pt goal is to return to living independently.    Speech Therapy Frequency 2x / week    Duration 12 weeks    Treatment/Interventions Language facilitation;Environmental controls;Cueing hierarchy;SLP instruction and feedback;Compensatory techniques;Cognitive reorganization;Functional tasks;Compensatory strategies;Internal/external aids;Multimodal communcation approach;Patient/family education    Potential to Achieve Goals Good    SLP Home Exercise Plan TBD    Consulted and Agree with Plan of Care Patient;Family member/caregiver             Patient will benefit from skilled therapeutic intervention in order to improve the following deficits and impairments:   Aphasia  Dysarthria and anarthria  Cognitive communication deficit    Problem List Patient Active Problem List   Diagnosis Date Noted   Cerebrovascular accident (CVA) ([place]) 09/06/2021   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/06/2021   Hyperlipidemia associated with type 2 diabetes mellitus ([place]) 06/02/2021   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 [place], [place], [address] Phone: [phone]   Fax:  [phone]   Name: Laura [name] MRN: [id number] Date of Birth: 01-Apr-1951

## 2021-09-23 ENCOUNTER — Encounter: Payer: Self-pay | Admitting: Internal Medicine

## 2021-09-23 ENCOUNTER — Other Ambulatory Visit: Payer: Self-pay

## 2021-09-23 ENCOUNTER — Ambulatory Visit: Payer: Medicare Other

## 2021-09-23 ENCOUNTER — Ambulatory Visit: Payer: Medicare Other | Admitting: Speech Pathology

## 2021-09-23 DIAGNOSIS — M6281 Muscle weakness (generalized): Secondary | ICD-10-CM | POA: Diagnosis not present

## 2021-09-23 DIAGNOSIS — R471 Dysarthria and anarthria: Secondary | ICD-10-CM

## 2021-09-23 DIAGNOSIS — R4701 Aphasia: Secondary | ICD-10-CM

## 2021-09-23 DIAGNOSIS — R41841 Cognitive communication deficit: Secondary | ICD-10-CM

## 2021-09-23 NOTE — Therapy (Signed)
[place] [facility]IN [facility] SERVICES [address] [place], [place], [address] Phone: [phone]   Fax:  [phone]  Speech Language Pathology Treatment  Patient Details  Name: Laura [name] MRN: [id number] Date of Birth: 01-08-1951 Referring Provider (SLP): Lauraine Rinne, PA-C   Encounter Date: 09/23/2021   End of Session - 09/23/21 1021     Visit Number 19    Number of Visits 25    Date for SLP Re-Evaluation 10/04/21    Authorization Type MCARE/[employer]IG[employer]    SLP Start Time 0900    SLP Stop Time  1000    SLP Time Calculation (min) 60 min    Activity Tolerance Patient tolerated treatment well             Past Medical History:  Diagnosis Date   Acute CVA (cerebrovascular accident) ([place]) 05/24/2021   Diabetes mellitus without complication ([place]) 13/24/4010   Hyperlipidemia 05/23/2021   Hypertension 05/23/2021   Left forearm fracture    in high school--was casted   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   Foot fracture, no surgery   TONSILLECTOMY      There were no vitals filed for this visit.   Subjective Assessment - 09/23/21 1014     Subjective Tried TED talks and wrote summary sentence    Patient is accompained by: Family member    Currently in Pain? No/denies                   ADULT SLP TREATMENT - 09/23/21 1015       General Information   Behavior/Cognition Alert;Cooperative    HPI Laura [name] is a 70 year old female with past medical hx noted for DM II, HLD,   admitted at [facility] on 05/23/21 with garbled speech, right facial droop, vision changes and  RUE weakness. MRI brain showed acute infarct in left caudate head and anterior lentiform nucleus, chronic microvascular ischemia. Resulting transcortical motor aphasia, dysarthria and cognitive deficits. Blood sugar was 372 on admission. Admitted to CIR  6/22- 06/02/21.      Cognitive-Linquistic Treatment   Treatment focused on Aphasia;Cognition    Skilled Treatment Targeted auditory comprehension/attention to detail/functional recall and sentence generation. SLP read aloud article of pt interest (dog rescue) 1-2 paragraphs at a time (text level 5). Pt generated summary sentence for each paragraph with min-mod A, occasional repetition, and extended time (8 sentences in 1 hour session, including time for reading/listening). Demonstration/education for brother-in-law on slowing down presentation of information, breaking it into smaller chunks for easier processing for pt.      Assessment / Recommendations / Plan   Plan Continue with current plan of care      Progression Toward Goals   Progression toward goals Progressing toward goals              SLP Education - 09/23/21 1020     Education Details information easier to process in smaller chunks    Person(s) Educated Patient;Caregiver(s)    Methods Explanation;Demonstration;Verbal cues    Comprehension Verbalized understanding;Need further instruction              SLP Short Term Goals - 09/08/21 0820       SLP SHORT TERM GOAL #1   Title Patient will name average of 8+ items for simple /concrete categories.    Time  10    Period --   sessions   Status Partially Met    Target Date 08/18/21      SLP SHORT TERM GOAL #2   Title Patient will generate fluent, grammatical, and cogent sentence to complete concrete linguistic tasks with 80% accuracy.    Time 10    Period --   sessions   Status Achieved    Target Date 08/18/21      SLP SHORT TERM GOAL #3   Title The patient will decode and read aloud sentences of varying length and complexity with 80% accuracy.    Time 10    Period --   sessions   Status Achieved    Target Date 08/18/21      SLP SHORT TERM GOAL #4   Title Patient will complete standardized assessment of cognitive communication skills.    Time 2    Period  Weeks    Status Achieved    Target Date 07/21/21      SLP SHORT TERM GOAL #5   Title Patient will write logical/appropriate sentences of 8-10 words 80% accuracy with min cues for double checking.    Time 10    Period --   sessions   Status New      SLP SHORT TERM GOAL #6   Title Patient will use compensations for aphasia/dysarthria in 5 min conversation with occasional min cues.    Time 10    Period --   sessions   Status New      SLP SHORT TERM GOAL #7   Title Patient will identify when she is perseverating and choose an alternative (break, use a strategy), 75% of the time with min verbal cue.    Time 10    Period --   sessions   Status New              SLP Long Term Goals - 08/19/21 1706       SLP LONG TERM GOAL #1   Title Patient will use compensations for aphasia and dysarthria in 10 minutes moderately complex conversation to express thoughts and opinions with listener comprehension at least 90%.    Time 12    Period Weeks    Status On-going      SLP LONG TERM GOAL #2   Title Patient will write or verbally sequence steps to complete simple to mod complex personally relevant tasks (ADLs, IADLs).    Time 12    Period Weeks    Status On-going      SLP LONG TERM GOAL #3   Title Pt will read paragraph-level materials relating to personal interests, financial and medical matters demonstrating comprehension of details >90% accuracy.    Time 12    Period Weeks    Status On-going              Plan - 09/23/21 1021     Clinical Impression Statement Kirstyn Lean presents with mild anomic aphasia, mild dysarthria, and mild-moderate cognitive deficits s/p CVA. Pt requires cues/prompting for engagement in conversation; while aphasia is a limiting factor, suspect pt social preferences also contributes (lived alone for many years without much social interaction). Pt able to generate 8 sentences today (mod complex) summarizing an article with min-mod cues, extended time and  repetition. I recommend skilled ST to address aphasia, dysarthria, and cognitive-communication deficits in order to improve ability to communicate wants and needs and increase pt independence, as pt goal is to return to living independently.  Speech Therapy Frequency 2x / week    Duration 12 weeks    Treatment/Interventions Language facilitation;Environmental controls;Cueing hierarchy;SLP instruction and feedback;Compensatory techniques;Cognitive reorganization;Functional tasks;Compensatory strategies;Internal/external aids;Multimodal communcation approach;Patient/family education    Potential to Achieve Goals Good    SLP Home Exercise Plan TBD    Consulted and Agree with Plan of Care Patient;Family member/caregiver             Patient will benefit from skilled therapeutic intervention in order to improve the following deficits and impairments:   Aphasia  Dysarthria and anarthria  Cognitive communication deficit    Problem List Patient Active Problem List   Diagnosis Date Noted   Cerebrovascular accident (CVA) ([place]) 09/06/2021   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/06/2021   Hyperlipidemia associated with type 2 diabetes mellitus ([place]) 06/02/2021   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 [place], [place], [address] Phone: [phone]   Fax:  [phone]   Name: Aleya Durnell MRN:  [id number] Date of Birth: 05-30-1951

## 2021-09-24 ENCOUNTER — Telehealth: Payer: Self-pay | Admitting: Neurology

## 2021-09-24 NOTE — Telephone Encounter (Signed)
Pt's sister is asking for a call re: when pt will be scheduled for the VAS US Carotid, please call

## 2021-09-27 ENCOUNTER — Ambulatory Visit: Payer: Medicare Other

## 2021-09-27 ENCOUNTER — Ambulatory Visit: Payer: Medicare Other | Admitting: Speech Pathology

## 2021-09-27 ENCOUNTER — Other Ambulatory Visit: Payer: Self-pay

## 2021-09-27 DIAGNOSIS — R41841 Cognitive communication deficit: Secondary | ICD-10-CM

## 2021-09-27 DIAGNOSIS — R4701 Aphasia: Secondary | ICD-10-CM

## 2021-09-27 DIAGNOSIS — M6281 Muscle weakness (generalized): Secondary | ICD-10-CM | POA: Diagnosis not present

## 2021-09-27 DIAGNOSIS — R471 Dysarthria and anarthria: Secondary | ICD-10-CM

## 2021-09-27 NOTE — Telephone Encounter (Signed)
Medicare/Cign[employer] no auth require sent message to Lupita Leash, she will reach out to the patient to schedule.

## 2021-09-27 NOTE — Therapy (Signed)
[place] MAIN [facility] SERVICES [address] [place], [place], [address] Phone: [phone]   Fax:  [phone]  Speech Language Pathology Treatment and Progress Update  Patient Details  Name: Laura [name] MRN: [id number] Date of Birth: November 03, 1951 Referring Provider (SLP): Lauraine Rinne, PA-C  Speech Therapy Progress Note  Dates of Reporting Period: 08/19/21 to 09/27/21  Objective: Patient has been seen for 10 speech therapy sessions this reporting period targeting aphasia, dysarthria, and cognitive communication deficits. Patient is making progress toward LTGs and met 1/3 STGs this reporting period. See skilled intervention, clinical impressions, and goals below for details.   Encounter Date: 09/27/2021   End of Session - 09/27/21 1044     Visit Number 20    Number of Visits 25    Date for SLP Re-Evaluation 10/04/21    Authorization Type MCARE/CIGNA    SLP Start Time 1000    SLP Stop Time  1100    SLP Time Calculation (min) 60 min    Activity Tolerance Patient tolerated treatment well             Past Medical History:  Diagnosis Date   Acute CVA (cerebrovascular accident) ([place]) 05/24/2021   Diabetes mellitus without complication ([place]) 53/97/6734   Hyperlipidemia 05/23/2021   Hypertension 05/23/2021   Left forearm fracture    in high school--was casted   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   Foot fracture, no surgery   TONSILLECTOMY      There were no vitals filed for this visit.   Subjective Assessment - 09/27/21 1123     Subjective "I saw my dog."    Patient is accompained by: Family member   sister   Currently in Pain? No/denies                   ADULT SLP TREATMENT - 09/27/21 1148       General Information   Behavior/Cognition Alert;Cooperative    HPI Sharona Rovner is a 70 year  old female with past medical hx noted for DM II, HLD,   admitted at [facility] on 05/23/21 with garbled speech, right facial droop, vision changes and  RUE weakness. MRI brain showed acute infarct in left caudate head and anterior lentiform nucleus, chronic microvascular ischemia. Resulting transcortical motor aphasia, dysarthria and cognitive deficits. Blood sugar was 372 on admission. Admitted to CIR 6/22- 06/02/21.      Cognitive-Linquistic Treatment   Treatment focused on Aphasia;Cognition    Skilled Treatment Pt generated 6 summary sentences for article with occasional min cues. Re-reading, occasional mod cues for comprehension of paragraph level material and identifying key concepts. Reviewed goals/progress. Pt reported difficulty "organizing" a sentence and wanted to target this in therapy. Discussed potential functional goal, such as having pt track blood pressure and blood sugar. Sister records this daily and reports pt has not shown interest in doing this.      Assessment / Recommendations / Plan   Plan Continue with current plan of care;Goals updated      Progression Toward Goals   Progression toward goals Progressing toward goals              SLP Education - 09/27/21 1155     Education Details therapy goals    Person(s) Educated Patient    Methods Explanation    Comprehension Verbalized  understanding;Need further instruction              SLP Short Term Goals - 09/27/21 1044       SLP SHORT TERM GOAL #1   Title Patient will name average of 8+ items for simple /concrete categories.    Time 10    Period --   sessions   Status Partially Met    Target Date 08/18/21      SLP SHORT TERM GOAL #2   Title Patient will generate fluent, grammatical, and cogent sentence to complete concrete linguistic tasks with 80% accuracy.    Time 10    Period --   sessions   Status Achieved    Target Date 08/18/21      SLP SHORT TERM GOAL #3   Title The patient will decode and read aloud  sentences of varying length and complexity with 80% accuracy.    Time 10    Period --   sessions   Status Achieved    Target Date 08/18/21      SLP SHORT TERM GOAL #4   Title Patient will complete standardized assessment of cognitive communication skills.    Time 2    Period Weeks    Status Achieved    Target Date 07/21/21      SLP SHORT TERM GOAL #5   Title Patient will write logical/appropriate sentences of 8-10 words 80% accuracy with min cues for double checking.    Time 10    Period --   sessions   Status Achieved      SLP SHORT TERM GOAL #6   Title Patient will use compensations for aphasia/dysarthria in 5 min conversation with occasional min cues.    Time 10    Period --   sessions   Status Partially Met   will continue for 10 visits     SLP SHORT TERM GOAL #7   Title Patient will identify when she is perseverating and choose an alternative (break, use a strategy), 75% of the time with min verbal cue.    Time 10    Period --   sessions   Status Not Met   will continue for 10 visits             SLP Long Term Goals - 09/27/21 1052       SLP LONG TERM GOAL #1   Title Patient will use compensations for aphasia and dysarthria in 10 minutes moderately complex conversation to express thoughts and opinions with listener comprehension at least 90%.    Time 12    Period Weeks    Status On-going    Target Date 12/26/21      SLP LONG TERM GOAL #2   Title Patient will write or verbally sequence steps to complete simple to mod complex personally relevant tasks (ADLs, IADLs).    Baseline simple only 10/24    Time 12    Period Weeks    Status On-going    Target Date 12/26/21      SLP LONG TERM GOAL #3   Title Pt will read paragraph-level materials relating to personal interests, financial and medical matters demonstrating comprehension of details >90% accuracy.    Time 12    Period Weeks    Status On-going    Target Date 12/26/21      SLP LONG TERM GOAL #4    Title Pt will demonstrate comprehension of written/verbal information related to chronic health conditions by identifying appropriate action steps  for management/prevention.    Time 12    Period Weeks    Status New    Target Date 12/26/21              Plan - 09/27/21 1157     Clinical Impression Statement Lular Letson presents with mild anomic aphasia, mild dysarthria, and mild-moderate cognitive deficits s/p CVA. Pt requires cues/prompting for engagement in conversation; while aphasia is a limiting factor, suspect pt social preferences also contributes (lived alone for many years without much social interaction). Patient is making slow, steady progress toward goals.  I recommend skilled ST to address aphasia, dysarthria, and cognitive-communication deficits in order to improve ability to communicate wants and needs and increase pt independence, as pt goal is to return to living independently.    Speech Therapy Frequency 2x / week    Duration 12 weeks    Treatment/Interventions Language facilitation;Environmental controls;Cueing hierarchy;SLP instruction and feedback;Compensatory techniques;Cognitive reorganization;Functional tasks;Compensatory strategies;Internal/external aids;Multimodal communcation approach;Patient/family education    Potential to Achieve Goals Good    SLP Home Exercise Plan TBD    Consulted and Agree with Plan of Care Patient;Family member/caregiver             Patient will benefit from skilled therapeutic intervention in order to improve the following deficits and impairments:   Aphasia  Dysarthria and anarthria  Cognitive communication deficit    Problem List Patient Active Problem List   Diagnosis Date Noted   Cerebrovascular accident (CVA) ([place]) 09/06/2021   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/06/2021   Hyperlipidemia associated with type 2 diabetes mellitus ([place]) 06/02/2021   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)  06/02/2021   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 (CVA) 06/02/2021   Vitamin D deficiency 06/02/2021   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

## 2021-09-30 ENCOUNTER — Other Ambulatory Visit: Payer: Self-pay

## 2021-09-30 ENCOUNTER — Ambulatory Visit: Payer: Medicare Other

## 2021-09-30 ENCOUNTER — Ambulatory Visit: Payer: Medicare Other | Admitting: Speech Pathology

## 2021-09-30 DIAGNOSIS — R41841 Cognitive communication deficit: Secondary | ICD-10-CM

## 2021-09-30 DIAGNOSIS — R471 Dysarthria and anarthria: Secondary | ICD-10-CM

## 2021-09-30 DIAGNOSIS — R4701 Aphasia: Secondary | ICD-10-CM

## 2021-09-30 DIAGNOSIS — M6281 Muscle weakness (generalized): Secondary | ICD-10-CM | POA: Diagnosis not present

## 2021-09-30 NOTE — Therapy (Signed)
[place] MAIN [facility] SERVICES [address] [place], [place], [address] Phone: [phone]   Fax:  [phone]  Speech Language Pathology Treatment  Patient Details  Name: Laura [name] MRN: [id number] Date of Birth: 22-Dec-1950 Referring Provider (SLP): Lauraine Rinne, PA-C   Encounter Date: 09/30/2021   End of Session - 09/30/21 1626     Visit Number 21    Number of Visits 25    Date for SLP Re-Evaluation 10/04/21    Authorization Type MCARE/CIGNA    SLP Start Time 0905    SLP Stop Time  1000    SLP Time Calculation (min) 55 min    Activity Tolerance Patient tolerated treatment well             Past Medical History:  Diagnosis Date   Acute CVA (cerebrovascular accident) ([place]) 05/24/2021   Diabetes mellitus without complication ([place]) 01/75/1025   Hyperlipidemia 05/23/2021   Hypertension 05/23/2021   Left forearm fracture    in high school--was casted   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   Foot fracture, no surgery   TONSILLECTOMY      There were no vitals filed for this visit.   Subjective Assessment - 09/30/21 1623     Subjective Sister brought stroke, diabetes, and blood pressure education materials from home.    Patient is accompained by: Family member   sister   Currently in Pain? No/denies                   ADULT SLP TREATMENT - 09/30/21 1624       General Information   Behavior/Cognition Alert;Cooperative    HPI Laura [name] is a 70 year old female with past medical hx noted for DM II, HLD,   admitted at [facility] on 05/23/21 with garbled speech, right facial droop, vision changes and  RUE weakness. MRI brain showed acute infarct in left caudate head and anterior lentiform nucleus, chronic microvascular ischemia. Resulting transcortical motor aphasia, dysarthria and cognitive deficits.  Blood sugar was 372 on admission. Admitted to CIR 6/22- 06/02/21.      Cognitive-Linquistic Treatment   Treatment focused on Aphasia;Cognition    Skilled Treatment Targeted information processing (auditory) and thought organization, written expression. Reviewed blood pressure education materials 2-4 sentences at a time. Pt identified key information and generated an appropriate summary sentence for each segment with usual mod cues.      Assessment / Recommendations / Plan   Plan Continue with current plan of care;Goals updated      Progression Toward Goals   Progression toward goals Progressing toward goals              SLP Education - 09/30/21 1626     Education Details break information into smaller chunks    Person(s) Educated Patient    Methods Explanation    Comprehension Verbalized understanding;Need further instruction              SLP Short Term Goals - 09/27/21 1044       SLP SHORT TERM GOAL #1   Title Patient will name average of 8+ items for simple /concrete categories.    Time 10    Period --   sessions   Status Partially Met    Target Date 08/18/21      SLP SHORT TERM GOAL #2  Title Patient will generate fluent, grammatical, and cogent sentence to complete concrete linguistic tasks with 80% accuracy.    Time 10    Period --   sessions   Status Achieved    Target Date 08/18/21      SLP SHORT TERM GOAL #3   Title The patient will decode and read aloud sentences of varying length and complexity with 80% accuracy.    Time 10    Period --   sessions   Status Achieved    Target Date 08/18/21      SLP SHORT TERM GOAL #4   Title Patient will complete standardized assessment of cognitive communication skills.    Time 2    Period Weeks    Status Achieved    Target Date 07/21/21      SLP SHORT TERM GOAL #5   Title Patient will write logical/appropriate sentences of 8-10 words 80% accuracy with min cues for double checking.    Time 10    Period --    sessions   Status Achieved      SLP SHORT TERM GOAL #6   Title Patient will use compensations for aphasia/dysarthria in 5 min conversation with occasional min cues.    Time 10    Period --   sessions   Status Partially Met   will continue for 10 visits     SLP SHORT TERM GOAL #7   Title Patient will identify when she is perseverating and choose an alternative (break, use a strategy), 75% of the time with min verbal cue.    Time 10    Period --   sessions   Status Not Met   will continue for 10 visits             SLP Long Term Goals - 09/27/21 1052       SLP LONG TERM GOAL #1   Title Patient will use compensations for aphasia and dysarthria in 10 minutes moderately complex conversation to express thoughts and opinions with listener comprehension at least 90%.    Time 12    Period Weeks    Status On-going    Target Date 12/26/21      SLP LONG TERM GOAL #2   Title Patient will write or verbally sequence steps to complete simple to mod complex personally relevant tasks (ADLs, IADLs).    Baseline simple only 10/24    Time 12    Period Weeks    Status On-going    Target Date 12/26/21      SLP LONG TERM GOAL #3   Title Pt will read paragraph-level materials relating to personal interests, financial and medical matters demonstrating comprehension of details >90% accuracy.    Time 12    Period Weeks    Status On-going    Target Date 12/26/21      SLP LONG TERM GOAL #4   Title Pt will demonstrate comprehension of written/verbal information related to chronic health conditions by identifying appropriate action steps for management/prevention.    Time 12    Period Weeks    Status New    Target Date 12/26/21              Plan - 09/30/21 1626     Clinical Impression Statement Laura [name] presents with mild anomic aphasia, mild dysarthria, and mild-moderate cognitive deficits s/p CVA. Pt improving in thought organization, writing simple sentences. For more complex  information, repetition and mod cues for processing and summarizing content. Patient  is making slow, steady progress toward goals.  I recommend skilled ST to address aphasia, dysarthria, and cognitive-communication deficits in order to improve ability to communicate wants and needs and increase pt independence, as pt goal is to return to living independently.    Speech Therapy Frequency 2x / week    Duration 12 weeks    Treatment/Interventions Language facilitation;Environmental controls;Cueing hierarchy;SLP instruction and feedback;Compensatory techniques;Cognitive reorganization;Functional tasks;Compensatory strategies;Internal/external aids;Multimodal communcation approach;Patient/family education    Potential to Achieve Goals Good    SLP Home Exercise Plan TBD    Consulted and Agree with Plan of Care Patient;Family member/caregiver             Patient will benefit from skilled therapeutic intervention in order to improve the following deficits and impairments:   Aphasia  Cognitive communication deficit  Dysarthria and anarthria    Problem List Patient Active Problem List   Diagnosis Date Noted   Cerebrovascular accident (CVA) ([place]) 09/06/2021   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/06/2021   Hyperlipidemia associated with type 2 diabetes mellitus ([place]) 06/02/2021   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 [place], [place], [address] Phone: [phone]   Fax:  [phone]   Name: Laura [name] MRN: [id number] Date of Birth: 1951-03-04

## 2021-10-02 ENCOUNTER — Other Ambulatory Visit: Payer: Self-pay | Admitting: Internal Medicine

## 2021-10-02 DIAGNOSIS — E118 Type 2 diabetes mellitus with unspecified complications: Secondary | ICD-10-CM

## 2021-10-02 NOTE — Telephone Encounter (Signed)
last RF 07/19/21 15 mll 1 RF

## 2021-10-04 ENCOUNTER — Ambulatory Visit: Payer: Medicare Other

## 2021-10-04 ENCOUNTER — Other Ambulatory Visit: Payer: Self-pay

## 2021-10-04 ENCOUNTER — Ambulatory Visit: Payer: Medicare Other | Admitting: Speech Pathology

## 2021-10-04 DIAGNOSIS — R471 Dysarthria and anarthria: Secondary | ICD-10-CM

## 2021-10-04 DIAGNOSIS — R4701 Aphasia: Secondary | ICD-10-CM

## 2021-10-04 DIAGNOSIS — M6281 Muscle weakness (generalized): Secondary | ICD-10-CM | POA: Diagnosis not present

## 2021-10-04 DIAGNOSIS — R41841 Cognitive communication deficit: Secondary | ICD-10-CM

## 2021-10-04 NOTE — Therapy (Signed)
[place] MAIN [facility] SERVICES [address] [place], [place], [address] Phone: [phone]   Fax:  [phone]  Speech Language Pathology Treatment  Patient Details  Name: Laura [name] MRN: [id number] Date of Birth: 1951/08/14 Referring Provider (SLP): Lauraine Rinne, PA-C   Encounter Date: 10/04/2021   End of Session - 10/04/21 1235     Visit Number 22    Number of Visits 25    Date for SLP Re-Evaluation 10/04/21    Authorization Type MCARE/[employer]    SLP Start Time 1000    SLP Stop Time  1100    SLP Time Calculation (min) 60 min    Activity Tolerance Patient tolerated treatment well             Past Medical History:  Diagnosis Date   Acute CVA (cerebrovascular accident) ([place]) 05/24/2021   Diabetes mellitus without complication ([place]) 74/07/1447   Hyperlipidemia 05/23/2021   Hypertension 05/23/2021   Left forearm fracture    in high school--was casted   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   Foot fracture, no surgery   TONSILLECTOMY      There were no vitals filed for this visit.   Subjective Assessment - 10/04/21 1226     Subjective "Oh, that." re: education materials    Patient is accompained by: Family member   brother-in-law   Currently in Pain? No/denies                   ADULT SLP TREATMENT - 10/04/21 1227       General Information   Behavior/Cognition Alert;Cooperative    HPI Laura [name] is a 70 year old female with past medical hx noted for DM II, HLD,   admitted at [facility] on 05/23/21 with garbled speech, right facial droop, vision changes and  RUE weakness. MRI brain showed acute infarct in left caudate head and anterior lentiform nucleus, chronic microvascular ischemia. Resulting transcortical motor aphasia, dysarthria and cognitive deficits. Blood sugar was 372 on admission.  Admitted to CIR 6/22- 06/02/21.      Cognitive-Linquistic Treatment   Treatment focused on Aphasia;Cognition    Skilled Treatment Continue to target thought organization, information processing, and written expression in functional tasks. With mod complex material (pt education materials re: blood pressure and DASH diet), patient required repetition and mod question cues to identify key points and generate sentences relevant to her condition. Patient listened to list of high potassium foods and generated a list of preferred foods with min cues.      Assessment / Recommendations / Plan   Plan Continue with current plan of care;Goals updated      Progression Toward Goals   Progression toward goals Progressing toward goals              SLP Education - 10/04/21 1235     Education Details home tasks to target thought organization, written/verbal expression    Person(s) Educated Patient    Methods Explanation    Comprehension Verbalized understanding;Need further instruction              SLP Short Term Goals - 09/27/21 1044       SLP SHORT TERM GOAL #1   Title Patient will name average of 8+ items for simple /concrete categories.    Time 10    Period --  sessions   Status Partially Met    Target Date 08/18/21      SLP SHORT TERM GOAL #2   Title Patient will generate fluent, grammatical, and cogent sentence to complete concrete linguistic tasks with 80% accuracy.    Time 10    Period --   sessions   Status Achieved    Target Date 08/18/21      SLP SHORT TERM GOAL #3   Title The patient will decode and read aloud sentences of varying length and complexity with 80% accuracy.    Time 10    Period --   sessions   Status Achieved    Target Date 08/18/21      SLP SHORT TERM GOAL #4   Title Patient will complete standardized assessment of cognitive communication skills.    Time 2    Period Weeks    Status Achieved    Target Date 07/21/21      SLP SHORT TERM GOAL #5    Title Patient will write logical/appropriate sentences of 8-10 words 80% accuracy with min cues for double checking.    Time 10    Period --   sessions   Status Achieved      SLP SHORT TERM GOAL #6   Title Patient will use compensations for aphasia/dysarthria in 5 min conversation with occasional min cues.    Time 10    Period --   sessions   Status Partially Met   will continue for 10 visits     SLP SHORT TERM GOAL #7   Title Patient will identify when she is perseverating and choose an alternative (break, use a strategy), 75% of the time with min verbal cue.    Time 10    Period --   sessions   Status Not Met   will continue for 10 visits             SLP Long Term Goals - 09/27/21 1052       SLP LONG TERM GOAL #1   Title Patient will use compensations for aphasia and dysarthria in 10 minutes moderately complex conversation to express thoughts and opinions with listener comprehension at least 90%.    Time 12    Period Weeks    Status On-going    Target Date 12/26/21      SLP LONG TERM GOAL #2   Title Patient will write or verbally sequence steps to complete simple to mod complex personally relevant tasks (ADLs, IADLs).    Baseline simple only 10/24    Time 12    Period Weeks    Status On-going    Target Date 12/26/21      SLP LONG TERM GOAL #3   Title Pt will read paragraph-level materials relating to personal interests, financial and medical matters demonstrating comprehension of details >90% accuracy.    Time 12    Period Weeks    Status On-going    Target Date 12/26/21      SLP LONG TERM GOAL #4   Title Pt will demonstrate comprehension of written/verbal information related to chronic health conditions by identifying appropriate action steps for management/prevention.    Time 12    Period Weeks    Status New    Target Date 12/26/21              Plan - 10/04/21 1236     Clinical Impression Statement Mersadies Petree presents with mild anomic aphasia, mild  dysarthria, and mild-moderate cognitive deficits  s/p CVA. Pt improving in thought organization, writing simple sentences. For more complex information, repetition and mod cues for processing and summarizing content. Patient is making slow, steady progress toward goals.  I recommend skilled ST to address aphasia, dysarthria, and cognitive-communication deficits in order to improve ability to communicate wants and needs and increase pt independence, as pt goal is to return to living independently.    Speech Therapy Frequency 2x / week    Duration 12 weeks    Treatment/Interventions Language facilitation;Environmental controls;Cueing hierarchy;SLP instruction and feedback;Compensatory techniques;Cognitive reorganization;Functional tasks;Compensatory strategies;Internal/external aids;Multimodal communcation approach;Patient/family education    Potential to Achieve Goals Good    SLP Home Exercise Plan TBD    Consulted and Agree with Plan of Care Patient;Family member/caregiver             Patient will benefit from skilled therapeutic intervention in order to improve the following deficits and impairments:   Aphasia  Cognitive communication deficit  Dysarthria and anarthria    Problem List Patient Active Problem List   Diagnosis Date Noted   Cerebrovascular accident (CVA) ([place]) 09/06/2021   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/06/2021   Hyperlipidemia associated with type 2 diabetes mellitus ([place]) 06/02/2021   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) 06/02/2021   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 (CVA) 06/02/2021   Vitamin D deficiency 06/02/2021   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

## 2021-10-07 ENCOUNTER — Ambulatory Visit: Payer: Medicare Other | Admitting: Physical Therapy

## 2021-10-07 ENCOUNTER — Other Ambulatory Visit: Payer: Self-pay

## 2021-10-07 ENCOUNTER — Ambulatory Visit: Payer: Medicare Other | Attending: Physician Assistant | Admitting: Speech Pathology

## 2021-10-07 DIAGNOSIS — R471 Dysarthria and anarthria: Secondary | ICD-10-CM

## 2021-10-07 DIAGNOSIS — I6300 Cerebral infarction due to thrombosis of unspecified precerebral artery: Secondary | ICD-10-CM | POA: Insufficient documentation

## 2021-10-07 DIAGNOSIS — R41841 Cognitive communication deficit: Secondary | ICD-10-CM | POA: Diagnosis present

## 2021-10-07 DIAGNOSIS — R4701 Aphasia: Secondary | ICD-10-CM

## 2021-10-07 NOTE — Therapy (Signed)
[place] [facility] MAIN [facility] SERVICES [address] [place], [place], [address] Phone: [phone]   Fax:  [phone]  Speech Language Pathology Treatment and Recertification  Patient Details  Name: Laura [name] MRN: [id number] Date of Birth: 04/18/1951 Referring Provider (SLP): Mariam Dollar, PA-C   Encounter Date: 10/07/2021   End of Session - 10/07/21 1000     Visit Number 23    Number of Visits 47    Date for SLP Re-Evaluation 01/05/22    Authorization Type MCARE/[employer]IG[employer]    SLP Start Time 0900    SLP Stop Time  1000    SLP Time Calculation (min) 60 min    Activity Tolerance Patient tolerated treatment well             Past Medical History:  Diagnosis Date   Acute CVA (cerebrovascular accident) ([facility]) 05/24/2021   Diabetes mellitus without complication ([facility]) 05/23/2021   Hyperlipidemia 05/23/2021   Hypertension 05/23/2021   Left forearm fracture    in high school--was casted   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   Foot fracture, no surgery   TONSILLECTOMY      There were no vitals filed for this visit.   Subjective Assessment - 10/07/21 0959     Subjective "I think I finished it."    Patient is accompained by: Family member   brother-in-law   Currently in Pain? No/denies                   ADULT SLP TREATMENT - 10/07/21 0959       General Information   Behavior/Cognition Alert;Cooperative    HPI Laura [name] is a 70 year old female with past medical hx noted for DM II, HLD,   admitted at [facility] on 05/23/21 with garbled speech, right facial droop, vision changes and  RUE weakness. MRI brain showed acute infarct in left caudate head and anterior lentiform nucleus, chronic microvascular ischemia. Resulting transcortical motor aphasia, dysarthria and cognitive deficits. Blood sugar was 372 on  admission. Admitted to CIR 6/22- 06/02/21.      Cognitive-Linquistic Treatment   Treatment focused on Aphasia;Cognition    Skilled Treatment Targeted simple-mod complex conversation based on pt's sentences written for homework re: management of blood pressure (food likes/dislikes, activities she does to manage blood pressure). Patient required verbal cues to use semantic feature analysis when anomia occured x2. Verbal requests for repeats necessary when intelligibility breakdowns occured, x3.      Assessment / Recommendations / Plan   Plan Continue with current plan of care;Goals updated      Progression Toward Goals   Progression toward goals Progressing toward goals              SLP Education - 10/07/21 1000     Education Details If you want to improve something, you need to do the thing you want to improve    Person(s) Educated Patient    Methods Explanation    Comprehension Verbalized understanding;Need further instruction              SLP Short Term Goals - 10/07/21 1002       Additional Short Term Goals   Additional Short Term Goals Yes      SLP SHORT TERM GOAL #8   Title Patient will repeat unintelligible speech with slower rate 80% of  the time with min cues (facial expression) in 5-8 minutes conversation.    Time 10    Period --   sessions   Status New      SLP SHORT TERM GOAL  #9   TITLE Patient will take active role in conversation by asking at least 3 questions in 10 minute discussion.    Time 10    Period --   sessions   Status New              SLP Long Term Goals - 10/07/21 1007       SLP LONG TERM GOAL #1   Title Patient will use compensations for aphasia and dysarthria in 10 minutes moderately complex conversation to express thoughts and opinions with listener comprehension at least 90%.    Time 12    Period Weeks    Status On-going    Target Date 12/26/21      SLP LONG TERM GOAL #2   Title Patient will write or verbally sequence steps  to complete simple to mod complex personally relevant tasks (ADLs, IADLs).    Baseline simple only 10/24    Time 12    Period Weeks    Status On-going    Target Date 12/26/21      SLP LONG TERM GOAL #3   Title Pt will read paragraph-level materials relating to personal interests, financial and medical matters demonstrating comprehension of details >90% accuracy.    Time 12    Period Weeks    Status On-going    Target Date 12/26/21      SLP LONG TERM GOAL #4   Title Pt will demonstrate comprehension of written/verbal information related to chronic health conditions by identifying appropriate action steps for management/prevention.    Time 12    Period Weeks    Status On-going    Target Date 12/26/21              Plan - 10/07/21 1001     Clinical Impression Statement Laura [name] presents with mild anomic aphasia, mild dysarthria, and mild-moderate cognitive deficits s/p CVA. Pt improving in thought organization, writing simple sentences. For more complex information, repetition and mod cues for processing and summarizing content. She requires verbal cues to use intelligibility strategies in conversation when breakdowns occur. Patient continues to work diligently on tasks at home outside of therapy. Goals recently updated at visit 20; LTGs remain appropriate. New STGs added today, recertification completed for 12 weeks. Continue skilled ST to address aphasia, dysarthria, and cognitive-communication deficits in order to improve ability to communicate wants and needs and increase pt independence, as pt goal is to return to living independently.    Speech Therapy Frequency 2x / week    Duration 12 weeks    Treatment/Interventions Language facilitation;Environmental controls;Cueing hierarchy;SLP instruction and feedback;Compensatory techniques;Cognitive reorganization;Functional tasks;Compensatory strategies;Internal/external aids;Multimodal communcation approach;Patient/family education     Potential to Achieve Goals Good    SLP Home Exercise Plan TBD    Consulted and Agree with Plan of Care Patient;Family member/caregiver             Patient will benefit from skilled therapeutic intervention in order to improve the following deficits and impairments:   Aphasia  Cognitive communication deficit  Dysarthria and anarthria    Problem List Patient Active Problem List   Diagnosis Date Noted   Cerebrovascular accident (CVA) ([facility]) 09/06/2021   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/06/2021   Hyperlipidemia associated with type 2 diabetes mellitus ([facility])  06/02/2021   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) 06/02/2021   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 (CVA) 06/02/2021   Vitamin D deficiency 06/02/2021   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

## 2021-10-08 ENCOUNTER — Ambulatory Visit (HOSPITAL_COMMUNITY)
Admission: RE | Admit: 2021-10-08 | Discharge: 2021-10-08 | Disposition: A | Discharge status: Home / Self Care | Payer: Medicare Other | Source: Ambulatory Visit | Attending: Neurology | Admitting: Neurology

## 2021-10-08 ENCOUNTER — Telehealth: Payer: Self-pay | Admitting: Neurology

## 2021-10-08 DIAGNOSIS — I639 Cerebral infarction, unspecified: Secondary | ICD-10-CM | POA: Diagnosis present

## 2021-10-08 DIAGNOSIS — I6521 Occlusion and stenosis of right carotid artery: Secondary | ICD-10-CM

## 2021-10-08 NOTE — Telephone Encounter (Signed)
I spoke to her sister on [place]. She verbalized understanding of the test results below.

## 2021-10-08 NOTE — Telephone Encounter (Signed)
  Summary: Right Carotid: Velocities in the right ICA are consistent with a 40-59%                stenosis.   Left Carotid: Velocities in the left ICA are consistent with a 1-39% stenosis.   Vertebrals:  Bilateral vertebral arteries demonstrate antegrade flow. Subclavians: Normal flow hemodynamics were seen in bilateral subclavian              arteries. Please call patient, ultrasound of carotid artery showed evidence of moderate stenosis on the right side, less than 39% stenosis on the left side

## 2021-10-11 ENCOUNTER — Ambulatory Visit: Payer: Medicare Other

## 2021-10-11 ENCOUNTER — Ambulatory Visit: Payer: Medicare Other | Admitting: Speech Pathology

## 2021-10-11 ENCOUNTER — Other Ambulatory Visit: Payer: Self-pay

## 2021-10-11 DIAGNOSIS — R4701 Aphasia: Secondary | ICD-10-CM | POA: Diagnosis not present

## 2021-10-11 DIAGNOSIS — R41841 Cognitive communication deficit: Secondary | ICD-10-CM

## 2021-10-11 DIAGNOSIS — R471 Dysarthria and anarthria: Secondary | ICD-10-CM

## 2021-10-11 NOTE — Therapy (Signed)
[place] [facility] MAIN [facility] SERVICES [address] [place], [place], [address] Phone: [phone]   Fax:  [phone]  Speech Language Pathology Treatment  Patient Details  Name: Laura [name] MRN: [id number] Date of Birth: 11-17-1951 Referring Provider (SLP): Mariam Dollar, PA-C   Encounter Date: 10/11/2021   End of Session - 10/11/21 1539     Visit Number 24    Number of Visits 47    Date for SLP Re-Evaluation 01/05/22    Authorization Type MCARE/[employer]    SLP Start Time 1000    SLP Stop Time  1100    SLP Time Calculation (min) 60 min    Activity Tolerance Patient tolerated treatment well             Past Medical History:  Diagnosis Date   Acute CVA (cerebrovascular accident) ([facility]) 05/24/2021   Diabetes mellitus without complication ([facility]) 05/23/2021   Hyperlipidemia 05/23/2021   Hypertension 05/23/2021   Left forearm fracture    in high school--was casted   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   Foot fracture, no surgery   TONSILLECTOMY      There were no vitals filed for this visit.   Subjective Assessment - 10/11/21 1537     Subjective Has been cleared for cataract surgery.    Patient is accompained by: Family member   sister Laura [name]   Currently in Pain? No/denies                   ADULT SLP TREATMENT - 10/11/21 1541       General Information   Behavior/Cognition Alert;Cooperative    HPI Laura [name] is a 70 year old female with past medical hx noted for DM II, HLD,   admitted at [facility] on 05/23/21 with garbled speech, right facial droop, vision changes and  RUE weakness. MRI brain showed acute infarct in left caudate head and anterior lentiform nucleus, chronic microvascular ischemia. Resulting transcortical motor aphasia, dysarthria and cognitive deficits. Blood sugar was 372 on admission.  Admitted to CIR 6/22- 06/02/21.      Cognitive-Linquistic Treatment   Treatment focused on Aphasia;Cognition    Skilled Treatment Facilitated discussion re: impact of hearing loss on communication/ cognition. Used written keywords to enhance pt comprehension. Pt did not initiate any conversation but did ask a question when prompted to do so. Discussed goal for initiating/maintaining conversation. In simple to mod complex conversation re: sports, pt required cue x1 for rephrasing, and cues x3 to slow rate/repeat for intelligibility.      Assessment / Recommendations / Plan   Plan Continue with current plan of care;Goals updated      Progression Toward Goals   Progression toward goals Progressing toward goals                SLP Short Term Goals - 10/07/21 1002       Additional Short Term Goals   Additional Short Term Goals Yes      SLP SHORT TERM GOAL #8   Title Patient will repeat unintelligible speech with slower rate 80% of the time with min cues (facial expression) in 5-8 minutes conversation.    Time 10    Period --   sessions   Status New      SLP SHORT TERM GOAL  #9   TITLE Patient will take  active role in conversation by asking at least 3 questions in 10 minute discussion.    Time 10    Period --   sessions   Status New              SLP Long Term Goals - 10/07/21 1007       SLP LONG TERM GOAL #1   Title Patient will use compensations for aphasia and dysarthria in 10 minutes moderately complex conversation to express thoughts and opinions with listener comprehension at least 90%.    Time 12    Period Weeks    Status On-going    Target Date 12/26/21      SLP LONG TERM GOAL #2   Title Patient will write or verbally sequence steps to complete simple to mod complex personally relevant tasks (ADLs, IADLs).    Baseline simple only 10/24    Time 12    Period Weeks    Status On-going    Target Date 12/26/21      SLP LONG TERM GOAL #3   Title Pt will read  paragraph-level materials relating to personal interests, financial and medical matters demonstrating comprehension of details >90% accuracy.    Time 12    Period Weeks    Status On-going    Target Date 12/26/21      SLP LONG TERM GOAL #4   Title Pt will demonstrate comprehension of written/verbal information related to chronic health conditions by identifying appropriate action steps for management/prevention.    Time 12    Period Weeks    Status On-going    Target Date 12/26/21              Plan - 10/11/21 1540     Clinical Impression Statement Laura [name] presents with mild anomic aphasia, mild dysarthria, and mild-moderate cognitive deficits s/p CVA. Pt improving in thought organization, writing simple sentences. For more complex information, repetition and mod cues for processing and summarizing content. She requires verbal cues to use intelligibility strategies in conversation when breakdowns occur. Patient continues to work diligently on tasks at home outside of therapy. Continue skilled ST to address aphasia, dysarthria, and cognitive-communication deficits in order to improve ability to communicate wants and needs and increase pt independence, as pt goal is to return to living independently.    Speech Therapy Frequency 2x / week    Duration 12 weeks    Treatment/Interventions Language facilitation;Environmental controls;Cueing hierarchy;SLP instruction and feedback;Compensatory techniques;Cognitive reorganization;Functional tasks;Compensatory strategies;Internal/external aids;Multimodal communcation approach;Patient/family education    Potential to Achieve Goals Good    SLP Home Exercise Plan TBD    Consulted and Agree with Plan of Care Patient;Family member/caregiver             Patient will benefit from skilled therapeutic intervention in order to improve the following deficits and impairments:   Aphasia  Cognitive communication deficit  Dysarthria and  anarthria    Problem List 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) 06/02/2021   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 (CVA) 06/02/2021   Vitamin D deficiency 06/02/2021   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

## 2021-10-14 ENCOUNTER — Ambulatory Visit: Payer: Medicare Other | Admitting: Speech Pathology

## 2021-10-14 ENCOUNTER — Ambulatory Visit: Payer: Medicare Other

## 2021-10-14 ENCOUNTER — Other Ambulatory Visit: Payer: Self-pay

## 2021-10-14 DIAGNOSIS — R4701 Aphasia: Secondary | ICD-10-CM | POA: Diagnosis not present

## 2021-10-14 DIAGNOSIS — R41841 Cognitive communication deficit: Secondary | ICD-10-CM

## 2021-10-14 DIAGNOSIS — R471 Dysarthria and anarthria: Secondary | ICD-10-CM

## 2021-10-14 NOTE — Therapy (Signed)
[facility] MAIN [facility] SERVICES [address] [place], [place], [address] Phone: [phone]   Fax:  [phone]  Speech Language Pathology Treatment  Patient Details  Name: Laura [name] MRN: [id number] Date of Birth: 06-21-1951 Referring Provider (SLP): Mariam Dollar, PA-C   Encounter Date: 10/14/2021   End of Session - 10/14/21 1018     Visit Number 25    Number of Visits 47    Date for SLP Re-Evaluation 01/05/22    Authorization Type MCARE/[employer]    SLP Start Time 0900    SLP Stop Time  1000    SLP Time Calculation (min) 60 min    Activity Tolerance Patient tolerated treatment well             Past Medical History:  Diagnosis Date   Acute CVA (cerebrovascular accident) (HCC) 05/24/2021   Diabetes mellitus without complication (HCC) 05/23/2021   Hyperlipidemia 05/23/2021   Hypertension 05/23/2021   Left forearm fracture    in high school--was casted   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   Foot fracture, no surgery   TONSILLECTOMY      There were no vitals filed for this visit.   Subjective Assessment - 10/14/21 1013     Subjective "I worked on this Psychiatrist (stroke education.    Patient is accompained by: Family member   brother-in-law   Currently in Pain? No/denies                   ADULT SLP TREATMENT - 10/14/21 1013       General Information   Behavior/Cognition Alert;Cooperative    HPI Laura [name] is a 70 year old female with past medical hx noted for DM II, HLD,   admitted at [facility] on 05/23/21 with garbled speech, right facial droop, vision changes and  RUE weakness. MRI brain showed acute infarct in left caudate head and anterior lentiform nucleus, chronic microvascular ischemia. Resulting transcortical motor aphasia, dysarthria and cognitive deficits. Blood sugar was 372 on  admission. Admitted to CIR 6/22- 06/02/21.      Cognitive-Linquistic Treatment   Treatment focused on Aphasia;Cognition    Skilled Treatment Reviewed home tasks/sentences re: stroke rehabilitation. Facilitated simple-mod complex conversation re: pt's stroke and recovery. Pt initially looking for answers to questions (re: thoughts, feelings/opinions) in her written materials, with encouragement and mod cues able to provide answers ("what do you think about...?"). Pt generated summary sentences re: "Learning about hearing aids," with extended time and min-mod cues. Pt generated description of features she will need in her hearing aids.      Assessment / Recommendations / Plan   Plan Continue with current plan of care;Goals updated      Progression Toward Goals   Progression toward goals Progressing toward goals              SLP Education - 10/14/21 1018     Education Details Hearing aid features    Person(s) Educated Patient;Caregiver(s)    Methods Explanation    Comprehension Verbalized understanding;Need further instruction              SLP Short Term Goals - 10/07/21 1002       Additional Short Term Goals   Additional Short Term Goals Yes      SLP SHORT TERM GOAL #8   Title Patient  will repeat unintelligible speech with slower rate 80% of the time with min cues (facial expression) in 5-8 minutes conversation.    Time 10    Period --   sessions   Status New      SLP SHORT TERM GOAL  #9   TITLE Patient will take active role in conversation by asking at least 3 questions in 10 minute discussion.    Time 10    Period --   sessions   Status New              SLP Long Term Goals - 10/07/21 1007       SLP LONG TERM GOAL #1   Title Patient will use compensations for aphasia and dysarthria in 10 minutes moderately complex conversation to express thoughts and opinions with listener comprehension at least 90%.    Time 12    Period Weeks    Status On-going    Target  Date 12/26/21      SLP LONG TERM GOAL #2   Title Patient will write or verbally sequence steps to complete simple to mod complex personally relevant tasks (ADLs, IADLs).    Baseline simple only 10/24    Time 12    Period Weeks    Status On-going    Target Date 12/26/21      SLP LONG TERM GOAL #3   Title Pt will read paragraph-level materials relating to personal interests, financial and medical matters demonstrating comprehension of details >90% accuracy.    Time 12    Period Weeks    Status On-going    Target Date 12/26/21      SLP LONG TERM GOAL #4   Title Pt will demonstrate comprehension of written/verbal information related to chronic health conditions by identifying appropriate action steps for management/prevention.    Time 12    Period Weeks    Status On-going    Target Date 12/26/21              Plan - 10/14/21 1018     Clinical Impression Statement Laura [name] presents with mild anomic aphasia, mild dysarthria, and mild-moderate cognitive deficits s/p CVA. Pt improving in thought organization, writing simple sentences. For more complex information, repetition and mod cues for processing and summarizing content. She requires verbal cues to use intelligibility strategies in conversation when breakdowns occur. Patient continues to work diligently on tasks at home outside of therapy. Continue skilled ST to address aphasia, dysarthria, and cognitive-communication deficits in order to improve ability to communicate wants and needs and increase pt independence, as pt goal is to return to living independently.    Speech Therapy Frequency 2x / week    Duration 12 weeks    Treatment/Interventions Language facilitation;Environmental controls;Cueing hierarchy;SLP instruction and feedback;Compensatory techniques;Cognitive reorganization;Functional tasks;Compensatory strategies;Internal/external aids;Multimodal communcation approach;Patient/family education    Potential to Achieve  Goals Good    SLP Home Exercise Plan TBD    Consulted and Agree with Plan of Care Patient;Family member/caregiver             Patient will benefit from skilled therapeutic intervention in order to improve the following deficits and impairments:   Aphasia  Cognitive communication deficit  Dysarthria and anarthria    Problem List Patient Active Problem List   Diagnosis Date Noted   Cerebrovascular accident (CVA) (HCC) 09/06/2021   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/06/2021   Hyperlipidemia associated with type 2 diabetes mellitus (HCC) 06/02/2021   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) 06/02/2021   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 (CVA) 06/02/2021   Vitamin D deficiency 06/02/2021   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

## 2021-10-18 ENCOUNTER — Other Ambulatory Visit: Payer: Self-pay

## 2021-10-18 ENCOUNTER — Ambulatory Visit: Payer: Medicare Other | Admitting: Speech Pathology

## 2021-10-18 ENCOUNTER — Ambulatory Visit: Payer: Medicare Other

## 2021-10-18 DIAGNOSIS — R4701 Aphasia: Secondary | ICD-10-CM

## 2021-10-18 DIAGNOSIS — I6300 Cerebral infarction due to thrombosis of unspecified precerebral artery: Secondary | ICD-10-CM

## 2021-10-18 DIAGNOSIS — R471 Dysarthria and anarthria: Secondary | ICD-10-CM

## 2021-10-18 DIAGNOSIS — R41841 Cognitive communication deficit: Secondary | ICD-10-CM

## 2021-10-18 NOTE — Therapy (Signed)
[facility] MAIN [facility] SERVICES [address] [place], [place], [address] Phone: [phone]   Fax:  [phone]  Speech Language Pathology Treatment  Patient Details  Name: Laura [name] MRN: [id number] Date of Birth: 1951/07/25 Referring Provider (SLP): Mariam Dollar, PA-C   Encounter Date: 10/18/2021   End of Session - 10/18/21 1147     Visit Number 26    Number of Visits 47    Date for SLP Re-Evaluation 01/05/22    Authorization Type MCARE/[employer]    SLP Start Time 1000    SLP Stop Time  1100    SLP Time Calculation (min) 60 min    Activity Tolerance Patient tolerated treatment well             Past Medical History:  Diagnosis Date   Acute CVA (cerebrovascular accident) (HCC) 05/24/2021   Diabetes mellitus without complication (HCC) 05/23/2021   Hyperlipidemia 05/23/2021   Hypertension 05/23/2021   Left forearm fracture    in high school--was casted   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   Foot fracture, no surgery   TONSILLECTOMY      There were no vitals filed for this visit.   Subjective Assessment - 10/18/21 1134     Subjective Patient pleasant and motivated for therapy. Sister present throughout    Patient is accompained by: Family member    Currently in Pain? No/denies                   ADULT SLP TREATMENT - 10/18/21 0001       General Information   Behavior/Cognition Alert;Cooperative;Pleasant mood    HPI Laura [name] is a 70 year old female with past medical hx noted for DM II, HLD,   admitted at [facility] on 05/23/21 with garbled speech, right facial droop, vision changes and  RUE weakness. MRI brain showed acute infarct in left caudate head and anterior lentiform nucleus, chronic microvascular ischemia. Resulting transcortical motor aphasia, dysarthria and cognitive deficits. Blood  sugar was 372 on admission. Admitted to CIR 6/22- 06/02/21.      Cognitive-Linquistic Treatment   Treatment focused on Aphasia;Cognition    Skilled Treatment Facilitated improved conversational speech with reduced burden on communication partner and increased patient expression of throughts and opinions for functional ADL task re: meeting new people. Patient initiated conversational questions x2 IND in a 10 minute conversation, improved to x5 with moderate lead in/topic verbal cue. Improved initiation of conversational questions with structured conversation re: patient centered topic "dogs" with x3/3 questions IND in 10 minutes. Patient IND expressed thoughts/opinions x5 during entire session. Patient completed auditory comprehension task re: functional information (hearing loss). During task, patient demonstrated organized thought processing for summarization of paragraph level material with 25% accuracy x4 improved to 100% with moderate assist via context breakdown/sentence lead ins. Patient expressed learned information/relation to self of material with 33% acc x3 IND improved to 100% with mod assist. Patient verbalized opinions re: paragraph information with 67% accuracy x3 with moderate context lead in assistance, improved to 100% with max assist.      Assessment / Recommendations / Plan   Plan Continue with current plan of care      Progression Toward Goals   Progression toward goals Progressing toward goals  SLP Education - 10/18/21 1147     Education Details Hearing loss, prevention, and management    Person(s) Educated Patient;Caregiver(s)    Methods Explanation;Demonstration    Comprehension Verbalized understanding;Need further instruction              SLP Short Term Goals - 10/07/21 1002       Additional Short Term Goals   Additional Short Term Goals Yes      SLP SHORT TERM GOAL #8   Title Patient will repeat unintelligible speech with slower rate  80% of the time with min cues (facial expression) in 5-8 minutes conversation.    Time 10    Period --   sessions   Status New      SLP SHORT TERM GOAL  #9   TITLE Patient will take active role in conversation by asking at least 3 questions in 10 minute discussion.    Time 10    Period --   sessions   Status New              SLP Long Term Goals - 10/07/21 1007       SLP LONG TERM GOAL #1   Title Patient will use compensations for aphasia and dysarthria in 10 minutes moderately complex conversation to express thoughts and opinions with listener comprehension at least 90%.    Time 12    Period Weeks    Status On-going    Target Date 12/26/21      SLP LONG TERM GOAL #2   Title Patient will write or verbally sequence steps to complete simple to mod complex personally relevant tasks (ADLs, IADLs).    Baseline simple only 10/24    Time 12    Period Weeks    Status On-going    Target Date 12/26/21      SLP LONG TERM GOAL #3   Title Pt will read paragraph-level materials relating to personal interests, financial and medical matters demonstrating comprehension of details >90% accuracy.    Time 12    Period Weeks    Status On-going    Target Date 12/26/21      SLP LONG TERM GOAL #4   Title Pt will demonstrate comprehension of written/verbal information related to chronic health conditions by identifying appropriate action steps for management/prevention.    Time 12    Period Weeks    Status On-going    Target Date 12/26/21              Plan - 10/18/21 1147     Clinical Impression Statement Skyelyn Scruggs presents with mild anomic aphasia, mild dysarthria, and mild-moderate cognitive deficits s/p CVA. Pt improving in thought organization, writing simple sentences. For more complex auditory comprehension, patient benefits from repetition of information and mod cues for processing, organizing, and summarizing content. She requires verbal cues to use intelligibility  strategies in conversation when breakdowns occur. Patient continues to work diligently on tasks at home outside of therapy. Continue skilled ST to address aphasia, dysarthria, and cognitive-communication deficits in order to improve ability to communicate wants and needs and increase pt independence, as pt goal is to return to living independently.    Speech Therapy Frequency 2x / week    Duration 12 weeks    Treatment/Interventions Language facilitation;Environmental controls;Cueing hierarchy;SLP instruction and feedback;Compensatory techniques;Cognitive reorganization;Functional tasks;Compensatory strategies;Internal/external aids;Multimodal communcation approach;Patient/family education    Potential to Achieve Goals Good    Consulted and Agree with Plan of Care Patient;Family member/caregiver  Patient will benefit from skilled therapeutic intervention in order to improve the following deficits and impairments:   Aphasia  Cognitive communication deficit  Dysarthria and anarthria  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 artery [facility])    Problem List 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 Larkin Morelos, M.A. CCC-SLP Adult-based Speech Language Pathologist [facility]  [facility] [phone]  Cynda Acres 10/18/2021, 11:50 AM  [facility] MAIN [facility] SERVICES [address] [place], [place], [address] Phone: [phone]   Fax:  [phone]   Name: Blakelynn Scheeler MRN: [id number] Date of Birth: [birth year]

## 2021-10-18 NOTE — Patient Instructions (Signed)
Continue to increase volume for improved intelligibility with family/friends. Increase conversational opportunities and lead in questions for improved conversational speech/expression of thought.

## 2021-10-21 ENCOUNTER — Ambulatory Visit: Payer: Medicare Other | Admitting: Speech Pathology

## 2021-10-21 ENCOUNTER — Other Ambulatory Visit: Payer: Self-pay

## 2021-10-21 ENCOUNTER — Ambulatory Visit: Payer: Medicare Other

## 2021-10-21 DIAGNOSIS — R4701 Aphasia: Secondary | ICD-10-CM

## 2021-10-21 DIAGNOSIS — R41841 Cognitive communication deficit: Secondary | ICD-10-CM

## 2021-10-21 NOTE — Therapy (Signed)
[facility] MAIN [facility] SERVICES [address] [place], [place], [address] Phone: [phone]   Fax:  [phone]  Speech Language Pathology Treatment  Patient Details  Name: Laura [name] MRN: [id number] Date of Birth: 1951-08-13 Referring Provider (SLP): Mariam Dollar, PA-C   Encounter Date: 10/21/2021   End of Session - 10/21/21 1223     Visit Number 27    Number of Visits 47    Date for SLP Re-Evaluation 01/05/22    Authorization Type MCARE/[employer]    SLP Start Time 0900    SLP Stop Time  1000    SLP Time Calculation (min) 60 min    Activity Tolerance Patient tolerated treatment well             Past Medical History:  Diagnosis Date   Acute CVA (cerebrovascular accident) (HCC) 05/24/2021   Diabetes mellitus without complication (HCC) 05/23/2021   Hyperlipidemia 05/23/2021   Hypertension 05/23/2021   Left forearm fracture    in high school--was casted   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   Foot fracture, no surgery   TONSILLECTOMY      There were no vitals filed for this visit.   Subjective Assessment - 10/21/21 1210     Subjective "How was the beach?"    Patient is accompained by: Family member   Brother-in-law   Currently in Pain? No/denies                   ADULT SLP TREATMENT - 10/21/21 1211       General Information   Behavior/Cognition Alert;Cooperative;Pleasant mood    HPI Laura [name] is a 70 year old female with past medical hx noted for DM II, HLD,   admitted at [facility] on 05/23/21 with garbled speech, right facial droop, vision changes and  RUE weakness. MRI brain showed acute infarct in left caudate head and anterior lentiform nucleus, chronic microvascular ischemia. Resulting transcortical motor aphasia, dysarthria and cognitive deficits. Blood sugar was 372 on admission.  Admitted to CIR 6/22- 06/02/21.      Cognitive-Linquistic Treatment   Treatment focused on Aphasia;Cognition    Skilled Treatment Patient has cataract surgery schedule for Dec. 7. Facilitated simple-mod complex conversation re: session with unfamiliar therapist earlier this week. Educated on strategies for communication effectiveness (rephrasing, environmental modifications, self-advocacy). Patient rephrased and generated summary sentences for functional content 30% accuracy; improved to 83% with question cues/lead-in and 100% with mod-max cues. Pt also generated sentences for self-advocacy with min-mod A.      Assessment / Recommendations / Plan   Plan Continue with current plan of care      Progression Toward Goals   Progression toward goals Progressing toward goals              SLP Education - 10/21/21 1223     Education Details communication effectiveness    Person(s) Educated Patient;Caregiver(s)    Methods Explanation;Verbal cues;Handout    Comprehension Verbalized understanding;Need further instruction;Returned demonstration;Verbal cues required              SLP Short Term Goals - 10/07/21 1002       Additional Short Term Goals   Additional Short Term Goals Yes      SLP SHORT TERM GOAL #8   Title Patient will repeat unintelligible speech with slower rate 80% of  the time with min cues (facial expression) in 5-8 minutes conversation.    Time 10    Period --   sessions   Status New      SLP SHORT TERM GOAL  #9   TITLE Patient will take active role in conversation by asking at least 3 questions in 10 minute discussion.    Time 10    Period --   sessions   Status New              SLP Long Term Goals - 10/07/21 1007       SLP LONG TERM GOAL #1   Title Patient will use compensations for aphasia and dysarthria in 10 minutes moderately complex conversation to express thoughts and opinions with listener comprehension at least 90%.    Time 12    Period Weeks     Status On-going    Target Date 12/26/21      SLP LONG TERM GOAL #2   Title Patient will write or verbally sequence steps to complete simple to mod complex personally relevant tasks (ADLs, IADLs).    Baseline simple only 10/24    Time 12    Period Weeks    Status On-going    Target Date 12/26/21      SLP LONG TERM GOAL #3   Title Pt will read paragraph-level materials relating to personal interests, financial and medical matters demonstrating comprehension of details >90% accuracy.    Time 12    Period Weeks    Status On-going    Target Date 12/26/21      SLP LONG TERM GOAL #4   Title Pt will demonstrate comprehension of written/verbal information related to chronic health conditions by identifying appropriate action steps for management/prevention.    Time 12    Period Weeks    Status On-going    Target Date 12/26/21              Plan - 10/21/21 1223     Clinical Impression Statement Laura [name] presents with mild anomic aphasia, mild dysarthria, and mild-moderate cognitive deficits s/p CVA. Pt improving in thought organization, writing simple sentences. Able to generate simple sentences for self-advocacy today re: hearing loss, aphasia. For more complex auditory comprehension, patient benefits from repetition of information and mod cues for processing, organizing, and summarizing content. She requires verbal cues to use intelligibility strategies in conversation when breakdowns occur. Patient continues to work diligently on tasks at home outside of therapy. Continue skilled ST to address aphasia, dysarthria, and cognitive-communication deficits in order to improve ability to communicate wants and needs and increase pt independence, as pt goal is to return to living independently.    Speech Therapy Frequency 2x / week    Duration 12 weeks    Treatment/Interventions Language facilitation;Environmental controls;Cueing hierarchy;SLP instruction and feedback;Compensatory  techniques;Cognitive reorganization;Functional tasks;Compensatory strategies;Internal/external aids;Multimodal communcation approach;Patient/family education    Potential to Achieve Goals Good    Consulted and Agree with Plan of Care Patient;Family member/caregiver             Patient will benefit from skilled therapeutic intervention in order to improve the following deficits and impairments:   Aphasia  Cognitive communication deficit    Problem List 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 [place], [place], [address] Phone: [phone]   Fax:  [phone]   Name: Laura [name] MRN: [id number] Date of Birth: 10-29-1951

## 2021-10-22 ENCOUNTER — Other Ambulatory Visit: Payer: Self-pay | Admitting: Internal Medicine

## 2021-10-22 DIAGNOSIS — E118 Type 2 diabetes mellitus with unspecified complications: Secondary | ICD-10-CM

## 2021-10-22 NOTE — Telephone Encounter (Signed)
Requested Prescriptions  Pending Prescriptions Disp Refills  . Insulin Glargine (BASAGLAR KWIKPEN) 100 UNIT/ML [Pharmacy Med Name: BASAGLAR 100 UNIT/ML KWIKPEN] 15 mL 0    Sig: INJECT 24 UNITS INTO THE SKIN DAILY.     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

## 2021-10-25 ENCOUNTER — Other Ambulatory Visit: Payer: Self-pay

## 2021-10-25 ENCOUNTER — Ambulatory Visit: Payer: Medicare Other | Admitting: Speech Pathology

## 2021-10-25 ENCOUNTER — Ambulatory Visit: Payer: Medicare Other

## 2021-10-25 DIAGNOSIS — R471 Dysarthria and anarthria: Secondary | ICD-10-CM

## 2021-10-25 DIAGNOSIS — R4701 Aphasia: Secondary | ICD-10-CM | POA: Diagnosis not present

## 2021-10-25 DIAGNOSIS — R41841 Cognitive communication deficit: Secondary | ICD-10-CM

## 2021-10-25 NOTE — Therapy (Signed)
[facility] MAIN [facility] SERVICES [address] [place], [place], [address] Phone: [phone]   Fax:  [phone]  Speech Language Pathology Treatment  Patient Details  Name: Laura [name] MRN: [id number] Date of Birth: 1951-04-16 Referring Provider (SLP): Mariam Dollar, PA-C   Encounter Date: 10/25/2021   End of Session - 10/25/21 1116     Visit Number 28    Number of Visits 47    Date for SLP Re-Evaluation 01/05/22    Authorization Type [employer]ARE/[employer]    SLP Start Time 1000    SLP Stop Time  1100    SLP Time Calculation (min) 60 min    Activity Tolerance Patient tolerated treatment well             Past Medical History:  Diagnosis Date   Acute CVA (cerebrovascular accident) (HCC) 05/24/2021   Diabetes mellitus without complication (HCC) 05/23/2021   Hyperlipidemia 05/23/2021   Hypertension 05/23/2021   Left forearm fracture    in high school--was casted   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   Foot fracture, no surgery   TONSILLECTOMY      There were no vitals filed for this visit.   Subjective Assessment - 10/25/21 1115     Subjective "It's cold."    Currently in Pain? No/denies                   ADULT SLP TREATMENT - 10/25/21 1116       General Information   Behavior/Cognition Alert;Cooperative;Pleasant mood    HPI Laura [name] is a 70 year old female with past medical hx noted for DM II, HLD,   admitted at [facility] on 05/23/21 with garbled speech, right facial droop, vision changes and  RUE weakness. MRI brain showed acute infarct in left caudate [name] and anterior lentiform nucleus, chronic microvascular ischemia. Resulting transcortical motor aphasia, dysarthria and cognitive deficits. Blood sugar was 372 on admission. Admitted to CIR 6/22- 06/02/21.      Cognitive-Linquistic Treatment    Treatment focused on Aphasia;Cognition    Skilled Treatment SLP facilitated conversation re: upcoming holiday and her stroke recovery. In mod complex conversation, pt required cues for intelligibility x2 (verbal cues) and min question cues, extended time for anomia. Next session will consider highly structured conversation targeting pt's expressive skills (asking questions) and preplanning for family gathering conversations.      Assessment / Recommendations / Plan   Plan Continue with current plan of care      Progression Toward Goals   Progression toward goals Progressing toward goals              SLP Education - 10/25/21 1116     Education Details progress in speech, language, and cognition    Person(s) Educated Patient;Caregiver(s)    Methods Explanation    Comprehension Verbalized understanding;Need further instruction              SLP Short Term Goals - 10/07/21 1002       Additional Short Term Goals   Additional Short Term Goals Yes      SLP SHORT TERM GOAL #8   Title Patient will repeat unintelligible speech with slower rate 80% of the time with min cues (facial expression) in 5-8 minutes conversation.    Time 10    Period --   sessions  Status New      SLP SHORT TERM GOAL  #9   TITLE Patient will take active role in conversation by asking at least 3 questions in 10 minute discussion.    Time 10    Period --   sessions   Status New              SLP Long Term Goals - 10/07/21 1007       SLP LONG TERM GOAL #1   Title Patient will use compensations for aphasia and dysarthria in 10 minutes moderately complex conversation to express thoughts and opinions with listener comprehension at least 90%.    Time 12    Period Weeks    Status On-going    Target Date 12/26/21      SLP LONG TERM GOAL #2   Title Patient will write or verbally sequence steps to complete simple to mod complex personally relevant tasks (ADLs, IADLs).    Baseline simple only 10/24     Time 12    Period Weeks    Status On-going    Target Date 12/26/21      SLP LONG TERM GOAL #3   Title Pt will read paragraph-level materials relating to personal interests, financial and medical matters demonstrating comprehension of details >90% accuracy.    Time 12    Period Weeks    Status On-going    Target Date 12/26/21      SLP LONG TERM GOAL #4   Title Pt will demonstrate comprehension of written/verbal information related to chronic health conditions by identifying appropriate action steps for management/prevention.    Time 12    Period Weeks    Status On-going    Target Date 12/26/21              Plan - 10/25/21 1116     Clinical Impression Statement Laura [name] presents with mild anomic aphasia, mild dysarthria, and mild-moderate cognitive deficits s/p CVA. Verbal cues necessary for repetition at slower rate when breakdowns occurred in mod complex conversation today; verbal expression improving in conversation, however patient requires listener support to participate in/maintain conversation.  Continue skilled ST to address aphasia, dysarthria, and cognitive-communication deficits in order to improve ability to communicate wants and needs and increase pt independence, as pt goal is to return to living independently.    Speech Therapy Frequency 2x / week    Duration 12 weeks    Treatment/Interventions Language facilitation;Environmental controls;Cueing hierarchy;SLP instruction and feedback;Compensatory techniques;Cognitive reorganization;Functional tasks;Compensatory strategies;Internal/external aids;Multimodal communcation approach;Patient/family education    Potential to Achieve Goals Good    Consulted and Agree with Plan of Care Patient;Family member/caregiver             Patient will benefit from skilled therapeutic intervention in order to improve the following deficits and impairments:   Aphasia  Cognitive communication deficit  Dysarthria and  anarthria    Problem List 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) 06/02/2021   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 (CVA) 06/02/2021   Vitamin D deficiency 06/02/2021   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

## 2021-10-26 ENCOUNTER — Ambulatory Visit: Payer: Medicare Other | Admitting: Speech Pathology

## 2021-10-26 DIAGNOSIS — R4701 Aphasia: Secondary | ICD-10-CM | POA: Diagnosis not present

## 2021-10-26 DIAGNOSIS — R471 Dysarthria and anarthria: Secondary | ICD-10-CM

## 2021-10-26 DIAGNOSIS — R41841 Cognitive communication deficit: Secondary | ICD-10-CM

## 2021-10-26 NOTE — Therapy (Signed)
[facility] MAIN [facility] SERVICES [address] [place], [place], [address] Phone: [phone]   Fax:  [phone]  Speech Language Pathology Treatment  Patient Details  Name: Laura [name] MRN: [id number] Date of Birth: 01-Jul-1951 Referring Provider (SLP): Mariam Dollar, PA-C   Encounter Date: 10/26/2021   End of Session - 10/26/21 1541     Visit Number 29    Number of Visits 47    Date for SLP Re-Evaluation 01/05/22    Authorization Type [employer]ARE/[employer]    SLP Start Time 1300    SLP Stop Time  1400    SLP Time Calculation (min) 60 min    Activity Tolerance Patient tolerated treatment well             Past Medical History:  Diagnosis Date   Acute CVA (cerebrovascular accident) (HCC) 05/24/2021   Diabetes mellitus without complication (HCC) 05/23/2021   Hyperlipidemia 05/23/2021   Hypertension 05/23/2021   Left forearm fracture    in high school--was casted   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   Foot fracture, no surgery   TONSILLECTOMY      There were no vitals filed for this visit.   Subjective Assessment - 10/26/21 1531     Subjective Wrote family recipe for homework    Currently in Pain? No/denies                   ADULT SLP TREATMENT - 10/26/21 1532       General Information   Behavior/Cognition Alert;Cooperative;Pleasant mood    HPI Laura [name] is a 70 year old female with past medical hx noted for DM II, HLD,   admitted at [facility] on 05/23/21 with garbled speech, right facial droop, vision changes and  RUE weakness. MRI brain showed acute infarct in left caudate head and anterior lentiform nucleus, chronic microvascular ischemia. Resulting transcortical motor aphasia, dysarthria and cognitive deficits. Blood sugar was 372 on admission. Admitted to CIR 6/22- 06/02/21.       Cognitive-Linquistic Treatment   Treatment focused on Aphasia;Cognition    Skilled Treatment Targeted conversation skills using visual supports; patient brainstormed 3 questions she could ask about a chosen topic of conversation. With mod cues, pt able to rephrase/generate more open-ended questions. With support of written key words (Tell me about... Why...? How...? What do you think about...?), pt asked at least 3 questions in each of structured 10-15 minute conversations re: various topics including pets, holidays, and food. As session progressed, pt initiated questions when slight pause introduced into conversation.      Assessment / Recommendations / Plan   Plan Continue with current plan of care      Progression Toward Goals   Progression toward goals Progressing toward goals              SLP Education - 10/26/21 1539     Education Details open ended questions    Person(s) Educated Patient    Methods Explanation;Verbal cues;Demonstration    Comprehension Verbalized understanding;Need further instruction              SLP Short Term Goals - 10/07/21 1002       Additional Short Term Goals   Additional Short Term Goals Yes      SLP SHORT TERM GOAL #8   Title Patient will repeat unintelligible speech  with slower rate 80% of the time with min cues (facial expression) in 5-8 minutes conversation.    Time 10    Period --   sessions   Status New      SLP SHORT TERM GOAL  #9   TITLE Patient will take active role in conversation by asking at least 3 questions in 10 minute discussion.    Time 10    Period --   sessions   Status New              SLP Long Term Goals - 10/07/21 1007       SLP LONG TERM GOAL #1   Title Patient will use compensations for aphasia and dysarthria in 10 minutes moderately complex conversation to express thoughts and opinions with listener comprehension at least 90%.    Time 12    Period Weeks    Status On-going    Target Date 12/26/21       SLP LONG TERM GOAL #2   Title Patient will write or verbally sequence steps to complete simple to mod complex personally relevant tasks (ADLs, IADLs).    Baseline simple only 10/24    Time 12    Period Weeks    Status On-going    Target Date 12/26/21      SLP LONG TERM GOAL #3   Title Pt will read paragraph-level materials relating to personal interests, financial and medical matters demonstrating comprehension of details >90% accuracy.    Time 12    Period Weeks    Status On-going    Target Date 12/26/21      SLP LONG TERM GOAL #4   Title Pt will demonstrate comprehension of written/verbal information related to chronic health conditions by identifying appropriate action steps for management/prevention.    Time 12    Period Weeks    Status On-going    Target Date 12/26/21              Plan - 10/26/21 1541     Clinical Impression Statement Laura [name] presents with mild anomic aphasia, mild dysarthria, and mild-moderate cognitive deficits s/p CVA. Verbal cues necessary for repetition at slower rate when breakdowns occurred in mod complex conversation today; verbal expression improving in conversation, however patient requires listener support to participate in/maintain conversation. In structured task with visual supports, pt initiated by asking questions. Continue skilled ST to address aphasia, dysarthria, and cognitive-communication deficits in order to improve ability to communicate wants and needs and increase pt independence, as pt goal is to return to living independently.    Speech Therapy Frequency 2x / week    Duration 12 weeks    Treatment/Interventions Language facilitation;Environmental controls;Cueing hierarchy;SLP instruction and feedback;Compensatory techniques;Cognitive reorganization;Functional tasks;Compensatory strategies;Internal/external aids;Multimodal communcation approach;Patient/family education    Potential to Achieve Goals Good    Consulted and Agree  with Plan of Care Patient;Family member/caregiver             Patient will benefit from skilled therapeutic intervention in order to improve the following deficits and impairments:   Aphasia  Cognitive communication deficit  Dysarthria and anarthria    Problem List 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) 06/02/2021   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 (CVA) 06/02/2021   Vitamin  D deficiency 06/02/2021   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 [place], [place], [address] Phone: [phone]   Fax:  [phone]   Name: Laura [name] MRN: [id number] Date of Birth: 1951-04-27

## 2021-10-27 ENCOUNTER — Ambulatory Visit (INDEPENDENT_AMBULATORY_CARE_PROVIDER_SITE_OTHER): Payer: Medicare Other | Admitting: Internal Medicine

## 2021-10-27 ENCOUNTER — Encounter: Payer: Self-pay | Admitting: Internal Medicine

## 2021-10-27 ENCOUNTER — Ambulatory Visit: Payer: Medicare Other

## 2021-10-27 ENCOUNTER — Other Ambulatory Visit: Payer: Self-pay

## 2021-10-27 VITALS — BP 128/70 | HR 92 | Ht 68.0 in | Wt 141.4 lb

## 2021-10-27 DIAGNOSIS — I6521 Occlusion and stenosis of right carotid artery: Secondary | ICD-10-CM | POA: Diagnosis not present

## 2021-10-27 DIAGNOSIS — I69998 Other sequelae following unspecified cerebrovascular disease: Secondary | ICD-10-CM

## 2021-10-27 DIAGNOSIS — I69321 Dysphasia following cerebral infarction: Secondary | ICD-10-CM

## 2021-10-27 DIAGNOSIS — I10 Essential (primary) hypertension: Secondary | ICD-10-CM | POA: Diagnosis not present

## 2021-10-27 DIAGNOSIS — E118 Type 2 diabetes mellitus with unspecified complications: Secondary | ICD-10-CM | POA: Diagnosis not present

## 2021-10-27 DIAGNOSIS — R531 Weakness: Secondary | ICD-10-CM

## 2021-10-27 LAB — POCT GLYCOSYLATED HEMOGLOBIN (HGB A1C): Hemoglobin A1C: 5.7 % — AB (ref 4.0–5.6)

## 2021-10-27 MED ORDER — METFORMIN HCL 850 MG PO TABS: 850.0000 mg | ORAL_TABLET | Freq: Two times a day (BID) | ORAL | 0 refills | Status: DC

## 2021-10-27 MED ORDER — BASAGLAR KWIKPEN 100 UNIT/ML ~~LOC~~ SOPN
19.0000 [IU] | PEN_INJECTOR | Freq: Every day | SUBCUTANEOUS | 0 refills | Status: DC
Start: 2021-10-27 — End: 2022-02-28

## 2021-10-27 NOTE — Progress Notes (Addendum)
Date:  10/27/2021   Name:  Laura [name]   DOB:  March 04, 1951   MRN:  [id number]   Chief Complaint: Diabetes  Diabetes She presents for her follow-up diabetic visit. She has type 2 diabetes mellitus. Her disease course has been improving. Pertinent negatives for hypoglycemia include no headaches or tremors. Associated symptoms include weakness (and dysarthria and communication deficit). Pertinent negatives for diabetes include no chest pain, no fatigue, no polydipsia and no polyuria. Diabetic complications include a CVA. Current diabetic treatment includes oral agent (monotherapy) and insulin injections (metformin added last visit.).  Hypertension This is a chronic problem. The problem is controlled. Pertinent negatives include no chest pain, headaches, palpitations or shortness of breath. Past treatments include calcium channel blockers. The current treatment provides significant improvement. Hypertensive end-organ damage includes CVA.  CVA - discharged from OT and PT but still in [place].  Doing very well, very minimal weakness persists.  She is walking at least 4 days per week.  She feels well.  She has lost a few pounds due to dietary preferences. Recent Carotid US was <60% right stenosis. She has been cleared for cataract surgery next month. [facility] - audiology testing done and plans for hearing aids in the works.   Lab Results  Component Value Date   NA 139 07/26/2021   K 4.4 07/26/2021   CO2 23 07/26/2021   GLUCOSE 187 (H) 07/26/2021   BUN 22 07/26/2021   CREATININE 1.00 07/26/2021   CALCIUM 9.8 07/26/2021   EGFR 61 07/26/2021   GFRNONAA >60 05/31/2021   Lab Results  Component Value Date   CHOL 79 (L) 07/26/2021   HDL 27 (L) 07/26/2021   LDLCALC 36 07/26/2021   TRIG 77 07/26/2021   CHOLHDL 2.9 07/26/2021   Lab Results  Component Value Date   TSH 3.160 05/23/2021   Lab Results  Component Value Date   HGBA1C 5.7 (A) 10/27/2021   Lab Results  Component Value Date   WBC 6.9  05/31/2021   HGB 13.4 05/31/2021   HCT 40.3 05/31/2021   MCV 86.1 05/31/2021   PLT 293 05/31/2021   Lab Results  Component Value Date   ALT 35 (H) 07/26/2021   AST 22 07/26/2021   ALKPHOS 83 07/26/2021   BILITOT 0.3 07/26/2021   No components found for: VITD  Review of Systems  Constitutional:  Positive for unexpected weight change. Negative for appetite change, fatigue and fever.  HENT:  Positive for hearing loss. Negative for tinnitus and trouble swallowing.   Eyes:  Negative for visual disturbance.  Respiratory:  Negative for cough, chest tightness and shortness of breath.   Cardiovascular:  Negative for chest pain, palpitations and leg swelling.  Gastrointestinal:  Positive for diarrhea (mild loose stools at initiation of metformin - none since). Negative for abdominal pain, nausea and vomiting.  Endocrine: Negative for polydipsia and polyuria.  Genitourinary:  Negative for dysuria and hematuria.  Musculoskeletal:  Negative for arthralgias.  Neurological:  Positive for weakness (and dysarthria and communication deficit). Negative for tremors, numbness and headaches.       Discharged from [place] and PT  Psychiatric/Behavioral:  Negative for dysphoric mood.    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   Foot fracture, no surgery   TONSILLECTOMY      Social History   Tobacco Use   Smoking status: Never    Passive exposure: Never   Smokeless tobacco: Never  Vaping Use   Vaping Use: Never used  Substance Use Topics   Alcohol use: Never   Drug use: Never     Medication list has been reviewed and updated.  Current Meds  Medication Sig   Accu-Chek Softclix Lancets lancets Use twice a day   amLODipine (NORVASC) 5 MG tablet Take 1 tablet (5 mg total) by mouth daily.   aspirin 81 MG EC tablet Take 1 tablet (81 mg total) by mouth daily. Swallow whole.   atorvastatin (LIPITOR) 80 MG tablet Take 1 tablet (80 mg total) by mouth every evening.   cyanocobalamin 1000 MCG tablet Take 1 tablet (1,000 mcg total) by mouth daily.   glucose blood (ACCU-CHEK GUIDE) test strip Test blood sugar twice a day   Insulin Pen Needle (COMFORT TOUCH INSULIN PEN NEED) 32G X 6 MM MISC 1 application by Does not apply route daily.   polyethylene glycol (MIRALAX / GLYCOLAX) 17 g packet Take 17 g by mouth daily as needed for moderate constipation.   Vitamin D, Ergocalciferol, (DRISDOL) 1.25 MG (50000 UNIT) CAPS capsule Take 1 capsule (50,000 Units total) by mouth every 7 (seven) days.   [DISCONTINUED] Insulin Glargine (BASAGLAR KWIKPEN) 100 UNIT/ML INJECT 24 UNITS INTO THE SKIN DAILY.   [DISCONTINUED] metFORMIN (GLUCOPHAGE) 850 MG tablet Take 1 tablet (850 mg total) by mouth 2 (two) times daily with a meal. (Patient taking differently: 850 mg in the morning, and 500 mg in the evening)    PHQ 2/9 Scores 10/27/2021 09/09/2021 09/09/2021 08/11/2021  PHQ - 2 Score 0 0 0 0  PHQ- 9 Score 0 3 - 0    GAD 7 : Generalized Anxiety Score 10/27/2021 07/26/2021 06/02/2021  Nervous, Anxious, on Edge 0 0 3  Control/stop worrying 0 0 3  Worry too much - different things 0 0 2  Trouble relaxing 0 0 2  Restless 0 0 0  Easily annoyed or irritable 0 0 0  Afraid -  awful might happen 0 0 1  Total GAD 7 Score 0 0 11  Anxiety Difficulty Not difficult at all - Very difficult    BP Readings from Last 3 Encounters:  10/27/21 128/70  09/09/21 126/75  09/06/21 113/60    Physical Exam Vitals and nursing note reviewed.  Constitutional:      General: She is not in acute distress.    Appearance: Normal appearance. She is well-developed.  HENT:     Head: Normocephalic and atraumatic.  Cardiovascular:     Rate and Rhythm: Normal rate and regular rhythm.     Pulses: Normal pulses.     Heart sounds: No murmur heard. Pulmonary:     Effort: Pulmonary effort is normal. No respiratory distress.  Musculoskeletal:     Cervical back: Normal range of motion.     Right  lower leg: No edema.     Left lower leg: No edema.  Skin:    General: Skin is warm and dry.     Capillary Refill: Capillary refill takes less than 2 seconds.     Findings: No rash.  Neurological:     Mental Status: She is alert and oriented to person, place, and time.     Comments: Very mild right sided residual; speech much more articulate  Psychiatric:        Mood and Affect: Mood normal.        Behavior: Behavior normal.    Wt Readings from Last 3 Encounters:  10/27/21 141 lb 6.4 oz (64.1 kg)  09/09/21 147 lb 12.8 oz (67 kg)  09/06/21 147 lb (66.7 kg)    BP 128/70   Pulse 92   Ht 5' 8"  (1.727 m)   Wt 141 lb 6.4 oz (64.1 kg)   SpO2 99%   BMI 21.50 kg/m   Assessment and Plan: 1. Type II diabetes mellitus with complication (HCC) BS much improved with addition of metformin. Will increase to 850 mg bid and reduce insulin to 19 units. - POCT glycosylated hemoglobin (Hb A1C) - metFORMIN (GLUCOPHAGE) 850 MG tablet; Take 1 tablet (850 mg total) by mouth 2 (two) times daily with a meal.  Dispense: 60 tablet; Refill: 0 - Insulin Glargine (BASAGLAR KWIKPEN) 100 UNIT/ML; Inject 19 Units into the skin daily.  Dispense: 15 mL; Refill: 0  2. Essential hypertension Clinically stable exam  with well controlled BP. Tolerating medications without side effects at this time. Pt to continue current regimen and low sodium diet; benefits of regular exercise as able discussed.  3. Stenosis of right internal carotid artery Cleared for cataract surgery scheduled in December Once vision is restored, she can ask PT to perform a driving safety evaluation.  4. 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 (CVA) Much improved with PT and walking program. Recommend driving evaluation after cataract surgery. Continue aspirin daily.  5. 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   Partially dictated using Editor, commissioning. Any errors are unintentional.  Halina Maidens, MD [facility]  10/27/2021

## 2021-10-27 NOTE — Patient Instructions (Signed)
Reduce insulin to 19 units per day.  Increase the metformin to 850 mg twice a day.

## 2021-11-01 ENCOUNTER — Other Ambulatory Visit: Payer: Self-pay

## 2021-11-01 ENCOUNTER — Ambulatory Visit: Payer: Medicare Other

## 2021-11-01 ENCOUNTER — Ambulatory Visit: Payer: Medicare Other | Admitting: Speech Pathology

## 2021-11-01 DIAGNOSIS — R4701 Aphasia: Secondary | ICD-10-CM

## 2021-11-01 DIAGNOSIS — R471 Dysarthria and anarthria: Secondary | ICD-10-CM

## 2021-11-01 DIAGNOSIS — R41841 Cognitive communication deficit: Secondary | ICD-10-CM

## 2021-11-01 DIAGNOSIS — I6300 Cerebral infarction due to thrombosis of unspecified precerebral artery: Secondary | ICD-10-CM

## 2021-11-01 NOTE — Therapy (Signed)
[place] MAIN [facility] SERVICES [address] [place], [place], [address] Phone: [phone]   Fax:  [phone]  Speech Language Pathology Treatment and Progress Note  Patient Details  Name: Laura [name] MRN: [id number] Date of Birth: 1951/11/03 Referring Provider (SLP): Lauraine Rinne, PA-C  Speech Therapy Progress Note  Dates of Reporting Period: 09/30/21 to 11/01/21  Objective: Patient has been seen for 10 speech therapy sessions this reporting period targeting aphasia, dysarthria, and cognitive communication deficits. Patient is making progress toward LTGs (met 1/4) and STGs: will continue as pt is progressing. See skilled intervention, clinical impressions, and goals below for details.  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   Foot fracture, no surgery   TONSILLECTOMY      There were no vitals filed for this visit.          ADULT SLP TREATMENT - 11/01/21 1555       General Information   Behavior/Cognition Alert;Cooperative;Pleasant mood    HPI Laura [name] is a 70 year old female with past medical hx noted for DM II, HLD,   admitted at [facility] on 05/23/21 with garbled speech, right facial droop, vision changes and  RUE  weakness. MRI brain showed acute infarct in left caudate head and anterior lentiform nucleus, chronic microvascular ischemia. Resulting transcortical motor aphasia, dysarthria and cognitive deficits. Blood sugar was 372 on admission. Admitted to CIR 6/22- 06/02/21.      Cognitive-Linquistic Treatment   Treatment focused on Aphasia;Cognition    Skilled Treatment Administered non-visual cognitive assessments, with plans to complete CLQT s/p cataract surgery December 7. [employer] Verbal Learning Test Form 3; immediate recall: (trial 1: 7 (norm 7.26), trial 2: 6 (norm 9.86), trial 3: 7 (norm 10.43). Recognition 12/12. False positives: 0. SLUMS administered with pt scoring 24/30 (27 and above is WNL), consistent with mild neurocognitive disorder. Pt had difficulty with story retell and divergent naming tasks.      Assessment / Recommendations / Plan   Plan Continue with current plan of care      Progression Toward Goals   Progression toward goals Progressing toward goals              SLP Education - 11/01/21 1555     Education Details difference between memory and aphasia    Person(s) Educated Patient    Methods Explanation    Comprehension Verbalized understanding              SLP Short Term Goals - 11/01/21 1556       SLP SHORT TERM GOAL #8   Title Patient will repeat  unintelligible speech with slower rate 80% of the time with min cues (facial expression) in 5-8 minutes conversation.    Baseline 11/01/21: progressing, requires verbal cues occasionally; will continue    Time 10    Period --   sessions   Status On-going      SLP SHORT TERM GOAL  #9   TITLE Patient will take active role in conversation by asking at least 3 questions in 10 minute discussion.    Baseline 11/01/21: progressing visual structure/written cues required    Time 10    Period --   sessions   Status On-going              SLP Long Term Goals - 11/01/21 1558       SLP LONG TERM GOAL #1   Title  Patient will use compensations for aphasia and dysarthria in 10 minutes moderately complex conversation to express thoughts and opinions with listener comprehension at least 90%.    Time 12    Period Weeks    Status On-going      SLP LONG TERM GOAL #2   Title Patient will write or verbally sequence steps to complete simple to mod complex personally relevant tasks (ADLs, IADLs).    Baseline simple only 10/24    Time 12    Period Weeks    Status Achieved      SLP LONG TERM GOAL #3   Title Pt will read paragraph-level materials relating to personal interests, financial and medical matters demonstrating comprehension of details >90% accuracy.    Time 12    Period Weeks    Status On-going      SLP LONG TERM GOAL #4   Title Pt will demonstrate comprehension of written/verbal information related to chronic health conditions by identifying appropriate action steps for management/prevention.    Time 12    Period Weeks    Status On-going              Plan - 11/01/21 1559     Clinical Impression Statement Alainna Stawicki presents with mild anomic aphasia, mild dysarthria, and mild-moderate cognitive deficits s/p CVA. Patient initiates repetition at times with non-verbal cue when breakdowns occurred in mod complex conversation today; however patient requires cues for consistency and listener support to participate in/maintain conversation. Difficult to differentially dx role of cognitive deficits vs language given limitations of pt's vision and hearing, however anticipate ability to assess more thoroughly once she has cataract surgery and receives hearing aids next month. Continue skilled ST to address aphasia, dysarthria, and cognitive-communication deficits in order to improve ability to communicate wants and needs and increase pt independence, as pt goal is to return to living independently.    Speech Therapy Frequency 2x / week    Duration 12 weeks    Treatment/Interventions Language  facilitation;Environmental controls;Cueing hierarchy;SLP instruction and feedback;Compensatory techniques;Cognitive reorganization;Functional tasks;Compensatory strategies;Internal/external aids;Multimodal communcation approach;Patient/family education    Potential to Achieve Goals Good    Consulted and Agree with Plan of Care Patient;Family member/caregiver             Patient will benefit from skilled therapeutic intervention in order to improve the following deficits and impairments:   Aphasia  Cognitive communication deficit  Dysarthria and anarthria  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 artery [facility])    Problem List Patient Active Problem List   Diagnosis Date Noted   Cerebrovascular accident (CVA) ([place]) 09/06/2021   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/06/2021  Hyperlipidemia associated with type 2 diabetes mellitus ([place]) 06/02/2021   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, [place], [place] 11/01/2021, 4:02 PM  [place] MAIN [facility] SERVICES [address] [place], [place], [address] Phone: [phone]   Fax:  [phone]   Name: Tifanie Gardiner MRN: [id number] Date of Birth: 01-21-1951

## 2021-11-04 ENCOUNTER — Ambulatory Visit: Payer: Medicare Other | Attending: Physician Assistant | Admitting: Speech Pathology

## 2021-11-04 ENCOUNTER — Other Ambulatory Visit: Payer: Self-pay

## 2021-11-04 ENCOUNTER — Ambulatory Visit: Payer: Medicare Other

## 2021-11-04 DIAGNOSIS — R4701 Aphasia: Secondary | ICD-10-CM | POA: Diagnosis not present

## 2021-11-04 DIAGNOSIS — R471 Dysarthria and anarthria: Secondary | ICD-10-CM | POA: Insufficient documentation

## 2021-11-04 DIAGNOSIS — R41841 Cognitive communication deficit: Secondary | ICD-10-CM | POA: Diagnosis present

## 2021-11-04 DIAGNOSIS — I6300 Cerebral infarction due to thrombosis of unspecified precerebral artery: Secondary | ICD-10-CM | POA: Insufficient documentation

## 2021-11-04 NOTE — Therapy (Signed)
[facility] MAIN [facility] SERVICES [address] [place], [place], [address] Phone: [phone]   Fax:  [phone]  Speech Language Pathology Treatment  Patient Details  Name: Laura [name] MRN: [id number] Date of Birth: [birth year] Referring Provider (SLP): Mariam Dollar, PA-C   Encounter Date: 11/04/2021   End of Session - 11/04/21 1221     Visit Number 31    Number of Visits 47    Date for SLP Re-Evaluation 01/05/22    Authorization Type MCARE/[employer]    SLP Start Time 0900    SLP Stop Time  1000    SLP Time Calculation (min) 60 min    Activity Tolerance Patient tolerated treatment well             Past Medical History:  Diagnosis Date   Acute CVA (cerebrovascular accident) ([facility]) 05/24/2021   Diabetes mellitus without complication ([facility]) 05/23/2021   Hyperlipidemia 05/23/2021   Hypertension 05/23/2021   Left forearm fracture    in high school--was casted   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   Foot fracture, no surgery   TONSILLECTOMY      There were no vitals filed for this visit.   Subjective Assessment - 11/04/21 1214     Subjective "I was under the weather."    Patient is accompained by: Family member    Currently in Pain? No/denies                   ADULT SLP TREATMENT - 11/04/21 1215       General Information   Behavior/Cognition Alert;Cooperative;Pleasant mood    HPI Laura [name] is a 70 year old female with past medical hx noted for DM II, HLD,   admitted at [facility] on 05/23/21 with garbled speech, right facial droop, vision changes and  RUE weakness. MRI brain showed acute infarct in left caudate head and anterior lentiform nucleus, chronic microvascular ischemia. Resulting transcortical motor aphasia, dysarthria and cognitive deficits. Blood sugar was 372 on admission. Admitted to CIR  6/22- 06/02/21.      Cognitive-Linquistic Treatment   Treatment focused on Aphasia;Cognition    Skilled Treatment Targeted awareness of intelligibility/use of strategies in conversation, with pt responding to visual cues only 2/3 opportunities to repeat unintelligible speech louder, slower. Pt recalled strategies of asking open-ended/how/why questions to distribute conversational burden. With visual supports only, (written key words), pt asked 2 questions in 10 minute conversation; verbal cuing necessary to ID other opportunities. For paragraph level comprehension re: more complex medical condition (diabetes education materials), patient required repetition, mod cues, and drawings to support comprehension. Required phrase-level lead-in to generate summary sentences.      Assessment / Recommendations / Plan   Plan Continue with current plan of care      Progression Toward Goals   Progression toward goals Progressing toward goals              SLP Education - 11/04/21 1221     Education Details visual cues for repetition    Person(s) Educated Patient    Methods Explanation    Comprehension Verbalized understanding              SLP Short Term Goals - 11/01/21 1556       SLP SHORT TERM GOAL #8   Title Patient will repeat unintelligible speech with slower  rate 80% of the time with min cues (facial expression) in 5-8 minutes conversation.    Baseline 11/01/21: progressing, requires verbal cues occasionally; will continue    Time 10    Period --   sessions   Status On-going      SLP SHORT TERM GOAL  #9   TITLE Patient will take active role in conversation by asking at least 3 questions in 10 minute discussion.    Baseline 11/01/21: progressing visual structure/written cues required    Time 10    Period --   sessions   Status On-going              SLP Long Term Goals - 11/01/21 1558       SLP LONG TERM GOAL #1   Title Patient will use compensations for aphasia and  dysarthria in 10 minutes moderately complex conversation to express thoughts and opinions with listener comprehension at least 90%.    Time 12    Period Weeks    Status On-going      SLP LONG TERM GOAL #2   Title Patient will write or verbally sequence steps to complete simple to mod complex personally relevant tasks (ADLs, IADLs).    Baseline simple only 10/24    Time 12    Period Weeks    Status Achieved      SLP LONG TERM GOAL #3   Title Pt will read paragraph-level materials relating to personal interests, financial and medical matters demonstrating comprehension of details >90% accuracy.    Time 12    Period Weeks    Status On-going      SLP LONG TERM GOAL #4   Title Pt will demonstrate comprehension of written/verbal information related to chronic health conditions by identifying appropriate action steps for management/prevention.    Time 12    Period Weeks    Status On-going              Plan - 11/04/21 1221     Clinical Impression Statement Laura [name] presents with mild anomic aphasia, mild dysarthria, and mild-moderate cognitive deficits s/p CVA. Patient initiates repetition in 2/3 opportunities with non-verbal cue when breakdowns occurred in mod complex conversation; continues to require listener support to participate in/maintain conversation. Difficult to differentially dx role of cognitive deficits vs language given limitations of pt's vision and hearing, however anticipate ability to assess more thoroughly once she has cataract surgery (Dec.7) and receives hearing aids (ENT on 12/13). Continue skilled ST to address aphasia, dysarthria, and cognitive-communication deficits in order to improve ability to communicate wants and needs and increase pt independence, as pt goal is to return to living independently.    Speech Therapy Frequency 2x / week    Duration 12 weeks    Treatment/Interventions Language facilitation;Environmental controls;Cueing hierarchy;SLP  instruction and feedback;Compensatory techniques;Cognitive reorganization;Functional tasks;Compensatory strategies;Internal/external aids;Multimodal communcation approach;Patient/family education    Potential to Achieve Goals Good    Consulted and Agree with Plan of Care Patient;Family member/caregiver             Patient will benefit from skilled therapeutic intervention in order to improve the following deficits and impairments:   Aphasia  Dysarthria and anarthria  Cognitive communication deficit    Problem List Patient Active Problem List   Diagnosis Date Noted   Cerebrovascular accident (CVA) ([facility]) 09/06/2021   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/06/2021   Hyperlipidemia associated with type 2 diabetes mellitus ([facility]) 06/02/2021   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) 06/02/2021   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 (CVA) 06/02/2021   Vitamin D deficiency 06/02/2021   Type II diabetes mellitus with complication ([facility])    Vitamin B12 deficiency    Essential hypertension 05/23/2021   Eyesight diminished 12/05/2020   Diminished hearing 12/05/2020   Rondel Baton, MS, CCC-SLP Speech-Language Pathologist   Arlana Lindau, CCC-SLP 11/04/2021, 12:23 PM  [place] [facility] MAIN [facility] SERVICES [address] [place], [place], [address] Phone: [phone]   Fax:  [phone]   Name: Laura [name] MRN: [id number] Date of Birth: 1951/03/10

## 2021-11-05 ENCOUNTER — Other Ambulatory Visit: Payer: Self-pay

## 2021-11-05 ENCOUNTER — Encounter: Payer: Self-pay | Admitting: Ophthalmology

## 2021-11-08 ENCOUNTER — Ambulatory Visit: Payer: Medicare Other

## 2021-11-08 ENCOUNTER — Ambulatory Visit: Payer: Medicare Other | Admitting: Speech Pathology

## 2021-11-08 ENCOUNTER — Other Ambulatory Visit: Payer: Self-pay

## 2021-11-08 DIAGNOSIS — R41841 Cognitive communication deficit: Secondary | ICD-10-CM

## 2021-11-08 DIAGNOSIS — R4701 Aphasia: Secondary | ICD-10-CM

## 2021-11-08 DIAGNOSIS — R471 Dysarthria and anarthria: Secondary | ICD-10-CM

## 2021-11-08 DIAGNOSIS — I6300 Cerebral infarction due to thrombosis of unspecified precerebral artery: Secondary | ICD-10-CM

## 2021-11-08 NOTE — Discharge Instructions (Signed)

## 2021-11-08 NOTE — Therapy (Signed)
[place] [facility] MAIN [facility] SERVICES [address] [place], [place], [address] Phone: [phone]   Fax:  [phone]  Speech Language Pathology Treatment  Patient Details  Name: Laura [name] MRN: [id number] Date of Birth: 01/07/1951 Referring Provider (SLP): Mariam Dollar, PA-C   Encounter Date: 11/08/2021   End of Session - 11/08/21 1223     Visit Number 32    Number of Visits 47    Date for SLP Re-Evaluation 01/05/22    Authorization Type MCARE/[employer]    Authorization - Visit Number 2    Progress Note Due on Visit 10    SLP Start Time 0900    SLP Stop Time  1000    SLP Time Calculation (min) 60 min    Activity Tolerance Patient tolerated treatment well             Past Medical History:  Diagnosis Date   Acute CVA (cerebrovascular accident) ([facility]) 05/24/2021   Diabetes mellitus without complication ([facility]) 05/23/2021   Hyperlipidemia 05/23/2021   Hypertension 05/23/2021   Left forearm fracture    in high school--was casted   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   Foot fracture, no surgery   TONSILLECTOMY      There were no vitals filed for this visit.   Subjective Assessment - 11/08/21 1221     Subjective "It was hard to understand." re: diabetes pt education book    Currently in Pain? No/denies                   ADULT SLP TREATMENT - 11/08/21 1222       General Information   Behavior/Cognition Alert;Cooperative;Pleasant mood    HPI Laura [name] is a 70 year old female with past medical hx noted for DM II, HLD,   admitted at [facility] on 05/23/21 with garbled speech, right facial droop, vision changes and  RUE weakness. MRI brain showed acute infarct in left caudate head and anterior lentiform nucleus, chronic microvascular ischemia. Resulting transcortical motor aphasia, dysarthria and cognitive  deficits. Blood sugar was 372 on admission. Admitted to CIR 6/22- 06/02/21.      Cognitive-Linquistic Treatment   Treatment focused on Aphasia;Cognition    Skilled Treatment Reviewed pt's homework which was correct. Pt recalled 2 strategies to increase her participation in conversation. Pt intelligibility in conversation 100% today; no repetitions required. Used written cues and brief pauses to prompt pt initiating questions. In 15 minute conversation (x3), pt asked average of 4 questions per topic.      Assessment / Recommendations / Plan   Plan Continue with current plan of care      Progression Toward Goals   Progression toward goals Progressing toward goals                SLP Short Term Goals - 11/01/21 1556       SLP SHORT TERM GOAL #8   Title Patient will repeat unintelligible speech with slower rate 80% of the time with min cues (facial expression) in 5-8 minutes conversation.    Baseline 11/01/21: progressing, requires verbal cues occasionally; will continue    Time 10    Period --   sessions   Status On-going      SLP SHORT TERM GOAL  #9   TITLE Patient will take active role in conversation by asking  at least 3 questions in 10 minute discussion.    Baseline 11/01/21: progressing visual structure/written cues required    Time 10    Period --   sessions   Status On-going              SLP Long Term Goals - 11/01/21 1558       SLP LONG TERM GOAL #1   Title Patient will use compensations for aphasia and dysarthria in 10 minutes moderately complex conversation to express thoughts and opinions with listener comprehension at least 90%.    Time 12    Period Weeks    Status On-going      SLP LONG TERM GOAL #2   Title Patient will write or verbally sequence steps to complete simple to mod complex personally relevant tasks (ADLs, IADLs).    Baseline simple only 10/24    Time 12    Period Weeks    Status Achieved      SLP LONG TERM GOAL #3   Title Pt will read  paragraph-level materials relating to personal interests, financial and medical matters demonstrating comprehension of details >90% accuracy.    Time 12    Period Weeks    Status On-going      SLP LONG TERM GOAL #4   Title Pt will demonstrate comprehension of written/verbal information related to chronic health conditions by identifying appropriate action steps for management/prevention.    Time 12    Period Weeks    Status On-going              Plan - 11/08/21 1223     Clinical Impression Statement Laura [name] presents with mild anomic aphasia, mild dysarthria, and mild-moderate cognitive deficits s/p CVA. Pt reported comprehension difficulties with mod complex written materials. More talkative/engaged and with some initiation during conversation today. Difficult to differentially dx role of cognitive deficits vs language given limitations of pt's vision and hearing, however anticipate ability to assess more thoroughly once she has cataract surgery (Dec.7) and receives hearing aids (ENT on 12/13). Continue skilled ST to address aphasia, dysarthria, and cognitive-communication deficits in order to improve ability to communicate wants and needs and increase pt independence, as pt goal is to return to living independently.    Speech Therapy Frequency 2x / week    Duration 12 weeks    Treatment/Interventions Language facilitation;Environmental controls;Cueing hierarchy;SLP instruction and feedback;Compensatory techniques;Cognitive reorganization;Functional tasks;Compensatory strategies;Internal/external aids;Multimodal communcation approach;Patient/family education    Potential to Achieve Goals Good    Consulted and Agree with Plan of Care Patient;Family member/caregiver             Patient will benefit from skilled therapeutic intervention in order to improve the following deficits and impairments:   Aphasia  Dysarthria and anarthria  Cognitive communication  deficit  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 artery [facility])    Problem List 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 [place], [place], [address] Phone: [phone]   Fax:  [phone]   Name: Laura [name] MRN: [id number] Date of Birth: March 15, 1951

## 2021-11-10 ENCOUNTER — Other Ambulatory Visit: Payer: Self-pay

## 2021-11-10 ENCOUNTER — Ambulatory Visit: Payer: Medicare Other | Admitting: Anesthesiology

## 2021-11-10 ENCOUNTER — Ambulatory Visit
Admission: RE | Admit: 2021-11-10 | Discharge: 2021-11-10 | Disposition: A | Discharge status: Home / Self Care | Payer: Medicare Other | Attending: Ophthalmology | Admitting: Ophthalmology

## 2021-11-10 ENCOUNTER — Encounter
Admission: RE | Disposition: A | Discharge status: Home / Self Care | Payer: Self-pay | Source: Home / Self Care | Attending: Ophthalmology

## 2021-11-10 ENCOUNTER — Encounter: Payer: Self-pay | Admitting: Ophthalmology

## 2021-11-10 DIAGNOSIS — F419 Anxiety disorder, unspecified: Secondary | ICD-10-CM | POA: Insufficient documentation

## 2021-11-10 DIAGNOSIS — E1151 Type 2 diabetes mellitus with diabetic peripheral angiopathy without gangrene: Secondary | ICD-10-CM | POA: Insufficient documentation

## 2021-11-10 DIAGNOSIS — E785 Hyperlipidemia, unspecified: Secondary | ICD-10-CM | POA: Diagnosis not present

## 2021-11-10 DIAGNOSIS — I10 Essential (primary) hypertension: Secondary | ICD-10-CM | POA: Diagnosis not present

## 2021-11-10 DIAGNOSIS — E039 Hypothyroidism, unspecified: Secondary | ICD-10-CM | POA: Insufficient documentation

## 2021-11-10 DIAGNOSIS — H2511 Age-related nuclear cataract, right eye: Secondary | ICD-10-CM | POA: Diagnosis present

## 2021-11-10 DIAGNOSIS — I499 Cardiac arrhythmia, unspecified: Secondary | ICD-10-CM | POA: Diagnosis not present

## 2021-11-10 DIAGNOSIS — E1136 Type 2 diabetes mellitus with diabetic cataract: Secondary | ICD-10-CM | POA: Insufficient documentation

## 2021-11-10 DIAGNOSIS — I69992 Facial weakness following unspecified cerebrovascular disease: Secondary | ICD-10-CM | POA: Diagnosis not present

## 2021-11-10 HISTORY — PX: CATARACT EXTRACTION W/PHACO: SHX586

## 2021-11-10 LAB — GLUCOSE, CAPILLARY
Glucose-Capillary: 102 mg/dL — ABNORMAL HIGH (ref 70–99)
Glucose-Capillary: 97 mg/dL (ref 70–99)

## 2021-11-10 SURGERY — PHACOEMULSIFICATION, CATARACT, WITH IOL INSERTION
Anesthesia: Monitor Anesthesia Care | Site: Eye | Laterality: Right

## 2021-11-10 MED ORDER — ACETAMINOPHEN 10 MG/ML IV SOLN: 1000.0000 mg | Freq: Once | INTRAVENOUS | Status: DC | PRN

## 2021-11-10 MED ORDER — ARMC OPHTHALMIC DILATING DROPS
1.0000 "application " | OPHTHALMIC | Status: DC | PRN
  Administered 2021-11-10 (×3): 1 via OPHTHALMIC

## 2021-11-10 MED ORDER — BRIMONIDINE TARTRATE-TIMOLOL 0.2-0.5 % OP SOLN
OPHTHALMIC | Status: DC | PRN
  Administered 2021-11-10: 1 [drp] via OPHTHALMIC

## 2021-11-10 MED ORDER — SIGHTPATH DOSE#1 BSS IO SOLN
INTRAOCULAR | Status: DC | PRN
  Administered 2021-11-10: 1 mL

## 2021-11-10 MED ORDER — MIDAZOLAM HCL 2 MG/2ML IJ SOLN
INTRAMUSCULAR | Status: DC | PRN
Start: 2021-11-10 — End: 2021-11-10
  Administered 2021-11-10: 1 mg via INTRAVENOUS

## 2021-11-10 MED ORDER — CEFUROXIME OPHTHALMIC INJECTION 1 MG/0.1 ML
INJECTION | OPHTHALMIC | Status: DC | PRN
  Administered 2021-11-10: 0.1 mL via INTRACAMERAL

## 2021-11-10 MED ORDER — SODIUM CHLORIDE FLUSH 0.9 % IV SOLN
INTRAVENOUS | Status: AC
  Filled 2021-11-10: qty 10

## 2021-11-10 MED ORDER — SIGHTPATH DOSE#1 NA HYALUR & NA CHOND-NA HYALUR IO KIT
PACK | INTRAOCULAR | Status: DC | PRN
  Administered 2021-11-10: 1 via OPHTHALMIC

## 2021-11-10 MED ORDER — LACTATED RINGERS IV SOLN: INTRAVENOUS | Status: DC

## 2021-11-10 MED ORDER — SIGHTPATH DOSE#1 BSS IO SOLN
INTRAOCULAR | Status: DC | PRN
  Administered 2021-11-10: 111 mL via OPHTHALMIC

## 2021-11-10 MED ORDER — FENTANYL CITRATE (PF) 100 MCG/2ML IJ SOLN
INTRAMUSCULAR | Status: DC | PRN
  Administered 2021-11-10: 50 ug via INTRAVENOUS

## 2021-11-10 MED ORDER — SIGHTPATH DOSE#1 BSS IO SOLN
INTRAOCULAR | Status: DC | PRN
  Administered 2021-11-10: 15 mL

## 2021-11-10 MED ORDER — ONDANSETRON HCL 4 MG/2ML IJ SOLN: 4.0000 mg | Freq: Once | INTRAMUSCULAR | Status: DC | PRN

## 2021-11-10 MED ORDER — TETRACAINE HCL 0.5 % OP SOLN
1.0000 [drp] | OPHTHALMIC | Status: DC | PRN
  Administered 2021-11-10 (×3): 1 [drp] via OPHTHALMIC

## 2021-11-10 SURGICAL SUPPLY — 10 items
CANNULA ANT/CHMB 27GA (MISCELLANEOUS) ×2 IMPLANT
GLOVE SRG 8 PF TXTR STRL LF DI (GLOVE) ×1 IMPLANT
GLOVE SURG ENC TEXT LTX SZ7.5 (GLOVE) ×2 IMPLANT
GLOVE SURG UNDER POLY LF SZ8 (GLOVE) ×2
LENS IOL TECNIS EYHANCE 23.0 (Intraocular Lens) ×2 IMPLANT
NEEDLE FILTER BLUNT 18X 1/2SAF (NEEDLE) ×1
NEEDLE FILTER BLUNT 18X1 1/2 (NEEDLE) ×1 IMPLANT
PACK EYE AFTER SURG (MISCELLANEOUS) ×2 IMPLANT
SYR 3ML LL SCALE MARK (SYRINGE) ×2 IMPLANT
WATER STERILE IRR 250ML POUR (IV SOLUTION) ×2 IMPLANT

## 2021-11-10 NOTE — H&P (Signed)
[facility]   Primary Care Physician:  Reubin Milan, MD Ophthalmologist: Dr. Lockie Mola  Pre-Procedure History & Physical: HPI:  Laura [name] is a 70 y.o. female here for ophthalmic surgery.   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   Foot fracture, no surgery   TONSILLECTOMY      Prior to Admission medications   Medication Sig Start Date End Date Taking? Authorizing Provider  amLODipine (NORVASC) 5 MG tablet Take 1 tablet (5 mg total) by mouth daily. 06/24/21  Yes Reubin Milan, MD  aspirin 81 MG EC tablet Take 1 tablet (81 mg total) by mouth daily. Swallow whole. 06/02/21  Yes Love, Evlyn Kanner, PA-C  atorvastatin (LIPITOR) 80 MG tablet Take 1 tablet (80 mg total) by mouth every evening. 06/24/21  Yes Reubin Milan, MD  cyanocobalamin 1000 MCG tablet Take 1 tablet (1,000 mcg total) by mouth daily. 06/24/21  Yes Reubin Milan, MD  Insulin Glargine [facility]) 100 UNIT/ML Inject 19 Units into the skin daily. 10/27/21  Yes Reubin Milan, MD  metFORMIN (GLUCOPHAGE) 850 MG tablet Take 1 tablet (850 mg total) by mouth 2 (two) times daily with a meal. 10/27/21  Yes Reubin Milan, MD  polyethylene glycol (MIRALAX / GLYCOLAX) 17 g packet Take 17 g by mouth daily as needed for moderate constipation. 05/26/21  Yes Wieting, Richard, MD  Vitamin D, Ergocalciferol, (DRISDOL) 1.25 MG (50000 UNIT) CAPS capsule Take 1 capsule (50,000 Units total) by mouth every 7 (seven) days. 06/24/21  Yes Reubin Milan, MD  Accu-Chek Softclix Lancets lancets Use twice a day 07/05/21   Reubin Milan, MD  glucose blood (ACCU-CHEK GUIDE) test strip Test blood sugar twice a day 07/05/21   Reubin Milan, MD  Insulin Pen Needle (COMFORT TOUCH INSULIN PEN NEED) 32G X 6 MM MISC 1 application by Does not apply route daily. 07/19/21   Reubin Milan, MD    Allergies as of 10/20/2021   (No Known Allergies)    Family History  Problem Relation Age of Onset   Hypertension Mother    Diabetes Mother    Arthritis Mother    Heart disease Mother    Heart failure Father    Stroke Father    COPD Father    Hypertension Sister    Arthritis Sister    Heart disease Maternal Grandfather    Heart disease Maternal Grandmother    Stroke Paternal Grandfather    Stroke Paternal Grandmother    Arthritis Sister    Hypertension Sister    Cancer Maternal Aunt    Diabetes Maternal Aunt    Cancer Paternal Aunt    Hypertension Sister    Varicose Veins Sister     Social History   Socioeconomic History   Marital status: Single    Spouse name: Not on file   Number of children: Not on file   Years of education: Not on file   Highest education level: Not on file  Occupational History   Not on file  Tobacco Use   Smoking status: Never  Passive exposure: Never   Smokeless tobacco: Never  Vaping Use   Vaping Use: Never used  Substance and Sexual Activity   Alcohol use: Never   Drug use: Never   Sexual activity: Not Currently    Birth control/protection: None  Other Topics Concern   Not on file  Social History Narrative   Not on file   Social Determinants of Health   Financial Resource Strain: Low Risk    Difficulty of Paying Living Expenses: Not hard at all  Food Insecurity: No Food Insecurity   Worried About Programme researcher, broadcasting/film/video in the Last Year: Never true   Ran Out of Food in the Last Year: Never true  Transportation Needs: No Transportation Needs   Lack of Transportation (Medical): No   Lack of Transportation (Non-Medical): No  Physical Activity: Insufficiently Active    Days of Exercise per Week: 4 days   Minutes of Exercise per Session: 30 min  Stress: No Stress Concern Present   Feeling of Stress : Not at all  Social Connections: Socially Isolated   Frequency of Communication with Friends and Family: Never   Frequency of Social Gatherings with Friends and Family: Never   Attends Religious Services: Never   Database administrator or Organizations: No   Attends Engineer, structural: Never   Marital Status: Never married  Catering manager Violence: Not At Risk   Fear of Current or Ex-Partner: No   Emotionally Abused: No   Physically Abused: No   Sexually Abused: No    Review of Systems: See HPI, otherwise negative ROS  Physical Exam: BP 118/67   Pulse 95   Temp (!) 97.3 F (36.3 C) (Temporal)   Resp 16   Ht 5\' 8"  (1.727 m)   Wt 62.6 kg   SpO2 98%   BMI 20.98 kg/m  General:   Alert,  pleasant and cooperative in NAD Head:  Normocephalic and atraumatic. Lungs:  Clear to auscultation.    Heart:  Regular rate and rhythm.   Impression/Plan: Laura [name] is here for ophthalmic surgery.  Risks, benefits, limitations, and alternatives regarding ophthalmic surgery have been reviewed with the patient.  Questions have been answered.  All parties agreeable.   [name], MD  11/10/2021, 7:31 AM

## 2021-11-10 NOTE — Op Note (Signed)
  LOCATION:  [facility]   PREOPERATIVE DIAGNOSIS:    Nuclear sclerotic cataract right eye. H25.11   POSTOPERATIVE DIAGNOSIS:  Nuclear sclerotic cataract right eye.     PROCEDURE:  Phacoemusification with posterior chamber intraocular lens placement of the right eye   ULTRASOUND TIME: Procedure(s): CATARACT EXTRACTION PHACO AND INTRAOCULAR LENS PLACEMENT (IOC) RIGHT DIABETIC  22.44 02:07.2 (Right)  LENS:   Implant Name Type Inv. Item Serial No. Manufacturer Lot No. LRB No. Used Action  LENS IOL TECNIS EYHANCE 23.0 - [id number] Intraocular Lens LENS IOL TECNIS EYHANCE 23.0 [id number]   Right 1 Implanted         SURGEON:  Deirdre Evener, MD   ANESTHESIA:  Topical with tetracaine drops and 2% Xylocaine jelly, augmented with 1% preservative-free intracameral lidocaine.    COMPLICATIONS:  None.   DESCRIPTION OF PROCEDURE:  The patient was identified in the holding room and transported to the operating room and placed in the supine position under the operating microscope.  The right eye was identified as the operative eye and it was prepped and draped in the usual sterile ophthalmic fashion.   A 1 millimeter clear-corneal paracentesis was made at the 12:00 position.  0.5 ml of preservative-free 1% lidocaine was injected into the anterior chamber. The anterior chamber was filled with Viscoat viscoelastic.  A 2.4 millimeter keratome was used to make a near-clear corneal incision at the 9:00 position.  A curvilinear capsulorrhexis was made with a cystotome and capsulorrhexis forceps.  Balanced salt solution was used to hydrodissect and hydrodelineate the nucleus.   Phacoemulsification was then used in stop and chop fashion to remove the lens nucleus and epinucleus.  The remaining cortex was then removed using the irrigation and aspiration handpiece. Provisc was then placed into the capsular bag to distend it for lens placement.  A lens was then injected into the  capsular bag.  The remaining viscoelastic was aspirated.   Wounds were hydrated with balanced salt solution.  The anterior chamber was inflated to a physiologic pressure with balanced salt solution.  No wound leaks were noted. Cefuroxime 0.1 ml of a 10mg /ml solution was injected into the anterior chamber for a dose of 1 mg of intracameral antibiotic at the completion of the case.   Timolol and Brimonidine drops were applied to the eye.  The patient was taken to the recovery room in stable condition without complications of anesthesia or surgery.   Laura [name] 11/10/2021, 8:11 AM

## 2021-11-10 NOTE — Anesthesia Postprocedure Evaluation (Signed)
Anesthesia Post Note  Patient: Laura [name]  Procedure(s) Performed: CATARACT EXTRACTION PHACO AND INTRAOCULAR LENS PLACEMENT (IOC) RIGHT DIABETIC  22.44 02:07.2 (Right: Eye)     Anesthesia Post Evaluation No notable events documented.  Voncile Schwarz A  Lillionna Nabi

## 2021-11-10 NOTE — Transfer of Care (Signed)
Immediate Anesthesia Transfer of Care Note  Patient: Laura [name]  Procedure(s) Performed: CATARACT EXTRACTION PHACO AND INTRAOCULAR LENS PLACEMENT (IOC) RIGHT DIABETIC  22.44 02:07.2 (Right: Eye)  Patient Location: PACU  Anesthesia Type: MAC  Level of Consciousness: awake, alert  and patient cooperative  Airway and Oxygen Therapy: Patient Spontanous Breathing and Patient connected to supplemental oxygen  Post-op Assessment: Post-op Vital signs reviewed, Patient's Cardiovascular Status Stable, Respiratory Function Stable, Patent Airway and No signs of Nausea or vomiting  Post-op Vital Signs: Reviewed and stable  Complications: No notable events documented.

## 2021-11-10 NOTE — Anesthesia Preprocedure Evaluation (Signed)
Anesthesia Evaluation  Patient identified by MRN, date of birth, ID band Patient awake    Reviewed: Allergy & Precautions, NPO status , Patient's Chart, lab work & pertinent test results, reviewed documented beta blocker date and time   History of Anesthesia Complications Negative for: history of anesthetic complications  Airway Mallampati: III  TM Distance: >3 FB Neck ROM: Limited    Dental   Pulmonary Current SmokerPatient did not abstain from smoking.,    breath sounds clear to auscultation       Cardiovascular hypertension, Pt. on medications and Pt. on home beta blockers (-) angina+ Peripheral Vascular Disease  (-) DOE + dysrhythmias  Rhythm:Regular Rate:Normal   HLD   Neuro/Psych Anxiety CVA    GI/Hepatic neg GERD  , Dysphagia   Endo/Other  diabetesHypothyroidism   Renal/GU      Musculoskeletal   Abdominal   Peds  Hematology   Anesthesia Other Findings Mild R facial droop (residual from stroke)  Reproductive/Obstetrics                             Anesthesia Physical Anesthesia Plan  ASA: 3  Anesthesia Plan: MAC   Post-op Pain Management:    Induction: Intravenous  PONV Risk Score and Plan: 2 and TIVA, Midazolam and Treatment may vary due to age or medical condition  Airway Management Planned: Nasal Cannula  Additional Equipment:   Intra-op Plan:   Post-operative Plan:   Informed Consent: I have reviewed the patients History and Physical, chart, labs and discussed the procedure including the risks, benefits and alternatives for the proposed anesthesia with the patient or authorized representative who has indicated his/her understanding and acceptance.       Plan Discussed with: CRNA and Anesthesiologist  Anesthesia Plan Comments:         Anesthesia Quick Evaluation

## 2021-11-11 ENCOUNTER — Encounter: Payer: Medicare Other | Admitting: Speech Pathology

## 2021-11-11 ENCOUNTER — Encounter: Payer: Self-pay | Admitting: Ophthalmology

## 2021-11-11 ENCOUNTER — Ambulatory Visit: Payer: Medicare Other

## 2021-11-15 ENCOUNTER — Encounter: Payer: Medicare Other | Admitting: Speech Pathology

## 2021-11-15 ENCOUNTER — Ambulatory Visit: Payer: Medicare Other

## 2021-11-18 ENCOUNTER — Ambulatory Visit: Payer: Medicare Other | Admitting: Speech Pathology

## 2021-11-18 ENCOUNTER — Encounter: Payer: Self-pay | Admitting: Ophthalmology

## 2021-11-18 ENCOUNTER — Other Ambulatory Visit: Payer: Self-pay

## 2021-11-18 ENCOUNTER — Ambulatory Visit: Payer: Medicare Other

## 2021-11-18 DIAGNOSIS — R4701 Aphasia: Secondary | ICD-10-CM

## 2021-11-18 DIAGNOSIS — R471 Dysarthria and anarthria: Secondary | ICD-10-CM

## 2021-11-18 DIAGNOSIS — R41841 Cognitive communication deficit: Secondary | ICD-10-CM

## 2021-11-18 NOTE — Therapy (Signed)
[facility] MAIN [facility] SERVICES [address] [place], [place], [address] Phone: [phone]   Fax:  [phone]  Speech Language Pathology Treatment  Patient Details  Name: Laura [name] MRN: [id number] Date of Birth: 24-Sep-1951 Referring Provider (SLP): Mariam Dollar, PA-C   Encounter Date: 11/18/2021   End of Session - 11/18/21 0949     Visit Number 33    Number of Visits 47    Date for SLP Re-Evaluation 01/05/22    Authorization Type [employer]    Authorization - Visit Number 3    Progress Note Due on Visit 10    SLP Start Time 0900    SLP Stop Time  1000    SLP Time Calculation (min) 60 min    Activity Tolerance Patient tolerated treatment well             Past Medical History:  Diagnosis Date   Acute CVA (cerebrovascular accident) (HCC) 05/24/2021   Diabetes mellitus without complication (HCC) 05/23/2021   Hyperlipidemia 05/23/2021   Hypertension 05/23/2021   Left forearm fracture    in high school--was casted   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;  Service: Ophthalmology;  Laterality: Right;   FOOT SURGERY     FRACTURE SURGERY  ?   Foot fracture, no surgery   TONSILLECTOMY      There were no vitals filed for this visit.   Subjective Assessment - 11/18/21 0955     Subjective Had cataract surgery last week, ENT also removed cerumen    Patient is accompained by: Family member   Ree Kida, brother-in-law   Currently in Pain? No/denies                   ADULT SLP TREATMENT - 11/18/21 0955       General Information   Behavior/Cognition Alert;Cooperative;Pleasant mood    HPI [name] is a 70 year old female  with past medical hx noted for DM II, HLD,   admitted at [facility] on 05/23/21 with garbled speech, right facial droop, vision changes and  RUE weakness. MRI brain showed acute infarct in left caudate head and anterior lentiform nucleus, chronic microvascular ischemia. Resulting transcortical motor aphasia, dysarthria and cognitive deficits. Blood sugar was 372 on admission. Admitted to CIR 6/22- 06/02/21.      Cognitive-Linquistic Treatment   Treatment focused on Aphasia;Cognition    Skilled Treatment Completed CLQT (visual tasks), see scores below. Targeted reading comprehension with short articles 300-500 words. Pt answered comprhension questions 83% accuracy. Facilitated simple-mod complex conversation re: articles.      Assessment / Recommendations / Plan   Plan Continue with current plan of care      Progression Toward Goals   Progression toward goals Progressing toward goals             Cognitive Linguistic Quick Test: AGE - 18 - 69   The Cognitive Linguistic Quick Test (CLQT) was administered to assess the relative status of five cognitive domains: attention, memory, language, executive functioning, and visuospatial skills. Scores from 10 tasks were used to estimate severity ratings (standardized for age groups 18-69 years and 70-89 years) for each domain, a clock drawing task, as well as an overall composite severity  rating of cognition.       Task Score Criterion Cut Scores  Personal Facts 8/8 8  Symbol Cancellation 11/12 11  Confrontation Naming 10/10 10  Clock Drawing  11/13 12  Story Retelling 5/10 6  Symbol Trails 6/10 9  Generative Naming 2/9 5  Design Memory 5/6 5  Mazes  8/8 7  Design Generation 5/13 6    Cognitive Domain Composite Score Severity Rating  Attention 174/215 Mild  Memory 138/185 Moderate auditory processing contributes, suspect more mild than moderate  Executive Function 21/40 Mild  Language 25/37 Mild  Visuospatial Skills 83/105 WNL  Clock Drawing   11/13 Mild  Composite Severity Rating  Mild            SLP Education - 11/18/21 0957     Education Details results of cognitive-linguistic assessment    Person(s) Educated Patient;Caregiver(s)    Methods Explanation    Comprehension Verbalized understanding              SLP Short Term Goals - 11/01/21 1556       SLP SHORT TERM GOAL #8   Title Patient will repeat unintelligible speech with slower rate 80% of the time with min cues (facial expression) in 5-8 minutes conversation.    Baseline 11/01/21: progressing, requires verbal cues occasionally; will continue    Time 10    Period --   sessions   Status On-going      SLP SHORT TERM GOAL  #9   TITLE Patient will take active role in conversation by asking at least 3 questions in 10 minute discussion.    Baseline 11/01/21: progressing visual structure/written cues required    Time 10    Period --   sessions   Status On-going              SLP Long Term Goals - 11/01/21 1558       SLP LONG TERM GOAL #1   Title Patient will use compensations for aphasia and dysarthria in 10 minutes moderately complex conversation to express thoughts and opinions with listener comprehension at least 90%.    Time 12    Period Weeks    Status On-going      SLP LONG TERM GOAL #2   Title Patient will write or verbally sequence steps to complete simple to mod complex personally relevant tasks (ADLs, IADLs).    Baseline simple only 10/24    Time 12    Period Weeks    Status Achieved      SLP LONG TERM GOAL #3   Title Pt will read paragraph-level materials relating to personal interests, financial and medical matters demonstrating comprehension of details >90% accuracy.    Time 12    Period Weeks    Status On-going      SLP LONG TERM GOAL #4   Title Pt will demonstrate comprehension of written/verbal information related to chronic health conditions by identifying appropriate action steps for management/prevention.    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 Now with improved vision s/p cataract surgery, able to complete cognitive assessment with visuospatial tasks. Pt demonstrates relative strengths with visuospatial tasks. Scored within overall mild range of severity on CLQT; appears language impacting pt most significantly (  decreased processing speed contributes to low memory score, vs true memory impairment). Continue skilled ST to address aphasia, dysarthria, and cognitive-communication deficits in order to improve ability to communicate wants and needs and increase pt independence, as pt goal is to return to living independently.    Speech Therapy Frequency 2x / week    Duration 12 weeks    Treatment/Interventions Language facilitation;Environmental controls;Cueing hierarchy;SLP instruction and feedback;Compensatory techniques;Cognitive reorganization;Functional tasks;Compensatory strategies;Internal/external aids;Multimodal communcation approach;Patient/family education    Potential to Achieve Goals Good    Consulted and Agree with Plan of Care Patient;Family member/caregiver             Patient will benefit from skilled therapeutic intervention in order to improve the following deficits and impairments:   Aphasia  Dysarthria and anarthria  Cognitive communication deficit    Problem List 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 [place], [place], [address] Phone: [phone]   Fax:  [phone]   Name: Arlis Everly MRN: [id number] Date of Birth: 1951/09/01

## 2021-11-22 ENCOUNTER — Other Ambulatory Visit: Payer: Self-pay

## 2021-11-22 ENCOUNTER — Ambulatory Visit: Payer: Medicare Other

## 2021-11-22 ENCOUNTER — Other Ambulatory Visit: Payer: Self-pay | Admitting: Internal Medicine

## 2021-11-22 ENCOUNTER — Ambulatory Visit: Payer: Medicare Other | Admitting: Speech Pathology

## 2021-11-22 DIAGNOSIS — R4701 Aphasia: Secondary | ICD-10-CM

## 2021-11-22 DIAGNOSIS — E118 Type 2 diabetes mellitus with unspecified complications: Secondary | ICD-10-CM

## 2021-11-22 DIAGNOSIS — R471 Dysarthria and anarthria: Secondary | ICD-10-CM

## 2021-11-22 DIAGNOSIS — R41841 Cognitive communication deficit: Secondary | ICD-10-CM

## 2021-11-22 NOTE — Therapy (Signed)
[name] MAIN [facility] SERVICES [address] [place], [place], [address] Phone: [phone]   Fax:  [phone]  Speech Language Pathology Treatment  Patient Details  Name: Laura [name] MRN: [id number] Date of Birth: 13-Aug-1951 Referring Provider (SLP): Mariam Dollar, PA-C   Encounter Date: 11/22/2021   End of Session - 11/22/21 1123     Visit Number 34    Number of Visits 47    Date for SLP Re-Evaluation 01/05/22    Authorization Type MCARE/[employer]    Authorization - Visit Number 4    Progress Note Due on Visit 10    SLP Start Time 1000    SLP Stop Time  1100    SLP Time Calculation (min) 60 min    Activity Tolerance Patient tolerated treatment well             Past Medical History:  Diagnosis Date   Acute CVA (cerebrovascular accident) (HCC) 05/24/2021   Diabetes mellitus without complication (HCC) 05/23/2021   Hyperlipidemia 05/23/2021   Hypertension 05/23/2021   Left forearm fracture    in high school--was casted   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   Foot fracture, no surgery   TONSILLECTOMY      There were no vitals filed for this visit.   Subjective Assessment - 11/22/21 1106     Subjective "Did I tell you about that?" to sister, re: testing last session    Patient is accompained by: Family member   sister [name]   Currently in Pain? No/denies                   ADULT SLP TREATMENT - 11/22/21 1107       General Information   Behavior/Cognition Alert;Cooperative;Pleasant mood    HPI Laura [name] is a 70 year old  female with past medical hx noted for DM II, HLD,   admitted at [facility] on 05/23/21 with garbled speech, right facial droop, vision changes and  RUE weakness. MRI brain showed acute infarct in left caudate head and anterior lentiform nucleus, chronic microvascular ischemia. Resulting transcortical motor aphasia, dysarthria and cognitive deficits. Blood sugar was 372 on admission. Admitted to CIR 6/22- 06/02/21.      Cognitive-Linquistic Treatment   Treatment focused on Aphasia;Cognition    Skilled Treatment Worked with pt and sister to ID functional goals as pt now beginning to take more of a role in visual tasks, such as logging her vitals (BP, blood sugar) and comparing nutrition labels on shopping outings. SLP summarized deficit areas; pt unable to ID areas that were most important to her after this conversation. Provided written keywords, which pt reviewed and selected "speech- make sure other people understand me." Discussed targeting auditory processing and pt self-advocacy (requesting repeats, asking questions, summarizing content). Pt able to return demonstration with simulated receptionist interaction with mod cues. Discussed communication breakdowns and strategies family can use to support pt in using anomia compensations, such as open-ended questions, validating/affirming competence, redirecting attention.      Assessment / Recommendations / Plan   Plan Goals updated      Progression Toward  Goals   Progression toward goals --   Goals revised             SLP Education - 11/22/21 1122     Education Details updates to POC    Person(s) Educated Patient;Caregiver(s)    Methods Explanation    Comprehension Verbalized understanding;Other (comment)   written cues             SLP Short Term Goals - 11/22/21 1149       SLP SHORT TERM GOAL #8   Title Patient will repeat unintelligible speech with slower rate in at least 3/5 opportunites with min cues (facial expression) in 5-8 minutes  conversation.    Time 10    Period --   sessions   Status Revised      SLP SHORT TERM GOAL  #9   TITLE Patient will take active role in conversation by asking at least 3 questions or requesting repetition in 10 minute discussion with min cues.    Baseline 11/01/21: progressing visual structure/written cues required    Time 10    Period --   sessions   Status Revised              SLP Long Term Goals - 11/22/21 1152       SLP LONG TERM GOAL #1   Title Patient will use compensations for aphasia and dysarthria in 10 minutes moderately complex conversation to express thoughts and opinions with listener comprehension at least 90%.    Time 12    Period Weeks    Status On-going    Target Date 01/05/22      SLP LONG TERM GOAL #3   Title Pt will read paragraph-level materials relating to personal interests, financial and medical matters demonstrating comprehension of details >90% accuracy.    Time 12    Period Weeks    Status On-going    Target Date 01/05/22      SLP LONG TERM GOAL #4   Title Pt will demonstrate comprehension of verbal information related to health conditions or personal interests by paraphrasing details at least 80% accuracy.    Time 12    Period Weeks    Status Revised    Target Date 01/05/22      SLP LONG TERM GOAL #5   Title Patient will repeat unintelligible speech with min visual cues in a noisy/distracting environment at least 80% of the time.    Time 12    Period Weeks    Target Date 02/20/22      Additional Long Term Goals   Additional Long Term Goals Yes      SLP LONG TERM GOAL #6   Title Pt will use external aids to manage appointments, chores, shopping, and log vital signs with mod I.    Time 12    Period Weeks    Status New    Target Date 02/20/22              Plan - 11/22/21 1123     Clinical Impression Statement Laura [name] presents with mild anomic aphasia, mild dysarthria, and mild-moderate cognitive deficits s/p CVA. Now with  improved vision s/p cataract surgery, able to complete cognitive assessment with visuospatial tasks. Pt demonstrates relative strengths with visuospatial tasks. Scored within overall mild range of severity on CLQT; appears language impacting pt most significantly (decreased processing speed contributes to low memory score, vs true memory impairment). Goals updated today. Continue skilled ST to address aphasia, dysarthria, and cognitive-communication  deficits in order to improve ability to communicate wants and needs and increase pt independence, as pt goal is to return to living independently.    Speech Therapy Frequency 2x / week    Duration 12 weeks    Treatment/Interventions Language facilitation;Environmental controls;Cueing hierarchy;SLP instruction and feedback;Compensatory techniques;Cognitive reorganization;Functional tasks;Compensatory strategies;Internal/external aids;Multimodal communcation approach;Patient/family education    Potential to Achieve Goals Good    Consulted and Agree with Plan of Care Patient;Family member/caregiver             Patient will benefit from skilled therapeutic intervention in order to improve the following deficits and impairments:   Aphasia  Dysarthria and anarthria  Cognitive communication deficit    Problem List 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 [place], [place], [address] Phone: [phone]   Fax:  [phone]   Name: Laura [name] MRN: [id number] Date of Birth: [birth year]

## 2021-11-22 NOTE — Patient Instructions (Signed)
For simple phones, look up [employer] (they have a simple smart phone and a flip phone).

## 2021-11-23 NOTE — Telephone Encounter (Signed)
Requested Prescriptions  Pending Prescriptions Disp Refills   BD PEN NEEDLE MICRO U/F 32G X 6 MM MISC [Pharmacy Med Name: BD UF MICRO PEN NEEDLE 6MMX32G] 100 each 0    Sig: USE DAILY AS DIRECTED     Endocrinology: Diabetes - Testing Supplies Passed - 11/22/2021  1:31 PM      Passed - Valid encounter within last 12 months    Recent Outpatient Visits          3 weeks ago Type II diabetes mellitus with complication [facility])   [facility] Reubin Milan, MD   4 months ago Type II diabetes mellitus with complication [facility])   [facility] Reubin Milan, MD   5 months ago 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

## 2021-11-25 ENCOUNTER — Other Ambulatory Visit: Payer: Self-pay

## 2021-11-25 ENCOUNTER — Ambulatory Visit: Payer: Medicare Other

## 2021-11-25 ENCOUNTER — Ambulatory Visit: Payer: Medicare Other | Admitting: Speech Pathology

## 2021-11-25 DIAGNOSIS — R41841 Cognitive communication deficit: Secondary | ICD-10-CM

## 2021-11-25 DIAGNOSIS — R4701 Aphasia: Secondary | ICD-10-CM

## 2021-11-25 DIAGNOSIS — R471 Dysarthria and anarthria: Secondary | ICD-10-CM

## 2021-11-25 NOTE — Therapy (Signed)
[facility] MAIN [facility] SERVICES [address] [place], [place], [address] Phone: [phone]   Fax:  [phone]  Speech Language Pathology Treatment  Patient Details  Name: Laura [name] MRN: [id number] Date of Birth: 08/16/1951 Referring Provider (SLP): Mariam Dollar, PA-C   Encounter Date: 11/25/2021   End of Session - 11/25/21 1006     Visit Number 35    Number of Visits 47    Date for SLP Re-Evaluation 01/05/22    Authorization Type MCARE/[employer]    Authorization - Visit Number 5    Progress Note Due on Visit 10    SLP Start Time 0900    SLP Stop Time  1000    SLP Time Calculation (min) 60 min    Activity Tolerance Patient tolerated treatment well             Past Medical History:  Diagnosis Date   Acute CVA (cerebrovascular accident) (HCC) 05/24/2021   Diabetes mellitus without complication (HCC) 05/23/2021   Hyperlipidemia 05/23/2021   Hypertension 05/23/2021   Left forearm fracture    in high school--was casted   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   Foot fracture, no surgery   TONSILLECTOMY      There were no vitals filed for this visit.   Subjective Assessment - 11/25/21 1001     Subjective "It's hard to remember later."    Patient is accompained by: Family member   BIL Ree Kida   Currently in Pain? No/denies                   ADULT SLP TREATMENT - 11/25/21 1002       General Information   Behavior/Cognition Alert;Cooperative;Pleasant mood    HPI [name] is a 69 year old female with past medical hx noted for DM II,  HLD,   admitted at [facility] on 05/23/21 with garbled speech, right facial droop, vision changes and  RUE weakness. MRI brain showed acute infarct in left caudate head and anterior lentiform nucleus, chronic microvascular ischemia. Resulting transcortical motor aphasia, dysarthria and cognitive deficits. Blood sugar was 372 on admission. Admitted to CIR 6/22- 06/02/21.      Cognitive-Linquistic Treatment   Treatment focused on Aphasia;Cognition    Skilled Treatment Reviewed home task of reading/summarizing diabetes education materials. Asked comprehension/recall questions re: content, which pt answered 80% accuracy with min cues. Pt expressed that she sometimes has difficulty recalling what she has read, but that reviewing her notes is helpful. Facilitated mod-complex conversation; pt asked 3 questions without prompting. Targeted auditory processing of details as well as pt use of compensations for listening (repeat back, ask a question, request repetition). When listening to multiparagraph articles re: topic of interest (animals), pt initiated request for repeats x2, asked a question x2, and rephrased/repeated back x2 with modified independence (visual reminder). Answered comprehension questions 83% accuracy, 100% with min cues for re-reading.      Assessment / Recommendations / Plan   Plan Continue with current plan of care      Progression Toward Goals   Progression toward goals Progressing toward goals  SLP Education - 11/25/21 1006     Education Details strategies for comprehension    Person(s) Educated Patient    Methods Explanation    Comprehension Verbalized understanding              SLP Short Term Goals - 11/22/21 1149       SLP SHORT TERM GOAL #8   Title Patient will repeat unintelligible speech with slower rate in at least 3/5 opportunites with min cues (facial expression) in 5-8 minutes conversation.    Time 10    Period --   sessions   Status Revised       SLP SHORT TERM GOAL  #9   TITLE Patient will take active role in conversation by asking at least 3 questions or requesting repetition in 10 minute discussion with min cues.    Baseline 11/01/21: progressing visual structure/written cues required    Time 10    Period --   sessions   Status Revised              SLP Long Term Goals - 11/22/21 1152       SLP LONG TERM GOAL #1   Title Patient will use compensations for aphasia and dysarthria in 10 minutes moderately complex conversation to express thoughts and opinions with listener comprehension at least 90%.    Time 12    Period Weeks    Status On-going    Target Date 01/05/22      SLP LONG TERM GOAL #3   Title Pt will read paragraph-level materials relating to personal interests, financial and medical matters demonstrating comprehension of details >90% accuracy.    Time 12    Period Weeks    Status On-going    Target Date 01/05/22      SLP LONG TERM GOAL #4   Title Pt will demonstrate comprehension of verbal information related to health conditions or personal interests by paraphrasing details at least 80% accuracy.    Time 12    Period Weeks    Status Revised    Target Date 01/05/22      SLP LONG TERM GOAL #5   Title Patient will repeat unintelligible speech with min visual cues in a noisy/distracting environment at least 80% of the time.    Time 12    Period Weeks    Target Date 02/20/22      Additional Long Term Goals   Additional Long Term Goals Yes      SLP LONG TERM GOAL #6   Title Pt will use external aids to manage appointments, chores, shopping, and log vital signs with mod I.    Time 12    Period Weeks    Status New    Target Date 02/20/22              Plan - 11/25/21 1006     Clinical Impression Statement [name] presents with mild anomic aphasia, mild dysarthria, and mild-moderate cognitive deficits s/p CVA. Patient demonstrating improved participation in conversation today. With visual aid  was able to use compensations for listening/processing effectively for multiparagraph level material. Continue skilled ST to address aphasia, dysarthria, and cognitive-communication deficits in order to improve ability to communicate wants and needs and increase pt independence, as pt goal is to return to living independently.    Speech Therapy Frequency 2x / week    Duration 12 weeks    Treatment/Interventions Language facilitation;Environmental controls;Cueing hierarchy;SLP instruction and feedback;Compensatory techniques;Cognitive reorganization;Functional tasks;Compensatory strategies;Internal/external aids;Multimodal communcation approach;Patient/family  education    Potential to Achieve Goals Good    Consulted and Agree with Plan of Care Patient;Family member/caregiver             Patient will benefit from skilled therapeutic intervention in order to improve the following deficits and impairments:   Aphasia  Dysarthria and anarthria  Cognitive communication deficit    Problem List 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 [place], [place], [address] Phone: [phone]   Fax:  [phone]   Name: Akhila Mahnken MRN: [id number] Date of Birth:  [birth year]

## 2021-11-26 ENCOUNTER — Other Ambulatory Visit: Payer: Self-pay | Admitting: Internal Medicine

## 2021-11-26 NOTE — Telephone Encounter (Signed)
Requested Prescriptions  Pending Prescriptions Disp Refills   amLODipine (NORVASC) 5 MG tablet [Pharmacy Med Name: AMLODIPINE BESYLATE 5 MG TAB] 90 tablet 1    Sig: TAKE 1 TABLET (5 MG TOTAL) BY MOUTH DAILY.     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)   [facility] Reubin Milan, MD      Future Appointments            In 2 months Reubin Milan, MD [facility]            atorvastatin (LIPITOR) 80 MG tablet [Pharmacy Med Name: ATORVASTATIN 80 MG TABLET] 90 tablet 2    Sig: TAKE 1 TABLET BY MOUTH EVERY EVENING     Cardiovascular:  Antilipid - Statins Failed - 11/26/2021  1:39 AM      Failed - Total Cholesterol in normal range and within 360 days    Cholesterol, Total  Date Value Ref Range Status  07/26/2021 79 (L) 100 - 199 mg/dL Final         Failed - HDL in normal range and within 360 days    HDL  Date Value Ref Range Status  07/26/2021 27 (L) >39 mg/dL Final         Passed - LDL in normal range and within 360 days    LDL Chol Calc (NIH)  Date Value Ref Range Status  07/26/2021 36 0 - 99 mg/dL Final         Passed - Triglycerides in normal range and within 360 days    Triglycerides  Date Value Ref Range Status  07/26/2021 77 0 - 149 mg/dL Final         Passed - Patient is not pregnant      Passed - Valid encounter within last 12 months    Recent Outpatient Visits          1 month ago Type II diabetes mellitus with  complication [facility])   [facility] Reubin Milan, MD   4 months ago Type II diabetes mellitus with complication [facility])   [facility] Reubin Milan, MD   5 months ago 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

## 2021-11-26 NOTE — Discharge Instructions (Signed)

## 2021-11-30 ENCOUNTER — Other Ambulatory Visit: Payer: Self-pay

## 2021-11-30 ENCOUNTER — Ambulatory Visit: Payer: Medicare Other | Admitting: Physical Therapy

## 2021-11-30 ENCOUNTER — Ambulatory Visit: Payer: Medicare Other | Admitting: Speech Pathology

## 2021-11-30 DIAGNOSIS — R4701 Aphasia: Secondary | ICD-10-CM

## 2021-11-30 DIAGNOSIS — R41841 Cognitive communication deficit: Secondary | ICD-10-CM

## 2021-11-30 DIAGNOSIS — R471 Dysarthria and anarthria: Secondary | ICD-10-CM

## 2021-12-01 ENCOUNTER — Other Ambulatory Visit: Payer: Self-pay

## 2021-12-01 ENCOUNTER — Ambulatory Visit
Admission: RE | Admit: 2021-12-01 | Discharge: 2021-12-01 | Disposition: A | Discharge status: Home / Self Care | Payer: Medicare Other | Source: Ambulatory Visit | Attending: Ophthalmology | Admitting: Ophthalmology

## 2021-12-01 ENCOUNTER — Encounter
Admission: RE | Disposition: A | Discharge status: Home / Self Care | Payer: Self-pay | Source: Ambulatory Visit | Attending: Ophthalmology

## 2021-12-01 ENCOUNTER — Ambulatory Visit: Payer: Medicare Other | Admitting: Anesthesiology

## 2021-12-01 ENCOUNTER — Encounter: Payer: Self-pay | Admitting: Ophthalmology

## 2021-12-01 DIAGNOSIS — H2512 Age-related nuclear cataract, left eye: Secondary | ICD-10-CM | POA: Diagnosis not present

## 2021-12-01 DIAGNOSIS — E1136 Type 2 diabetes mellitus with diabetic cataract: Secondary | ICD-10-CM | POA: Diagnosis not present

## 2021-12-01 HISTORY — PX: CATARACT EXTRACTION W/PHACO: SHX586

## 2021-12-01 LAB — GLUCOSE, CAPILLARY
Glucose-Capillary: 87 mg/dL (ref 70–99)
Glucose-Capillary: 82 mg/dL (ref 70–99)

## 2021-12-01 SURGERY — PHACOEMULSIFICATION, CATARACT, WITH IOL INSERTION
Anesthesia: Monitor Anesthesia Care | Site: Eye | Laterality: Left

## 2021-12-01 MED ORDER — TETRACAINE HCL 0.5 % OP SOLN
1.0000 [drp] | OPHTHALMIC | Status: DC | PRN
  Administered 2021-12-01 (×3): 1 [drp] via OPHTHALMIC

## 2021-12-01 MED ORDER — CEFUROXIME OPHTHALMIC INJECTION 1 MG/0.1 ML
INJECTION | OPHTHALMIC | Status: DC | PRN
  Administered 2021-12-01: 0.1 mL via INTRACAMERAL

## 2021-12-01 MED ORDER — BRIMONIDINE TARTRATE-TIMOLOL 0.2-0.5 % OP SOLN
OPHTHALMIC | Status: DC | PRN
  Administered 2021-12-01: 1 [drp] via OPHTHALMIC

## 2021-12-01 MED ORDER — FENTANYL CITRATE (PF) 100 MCG/2ML IJ SOLN
INTRAMUSCULAR | Status: DC | PRN
  Administered 2021-12-01: 50 ug via INTRAVENOUS

## 2021-12-01 MED ORDER — SIGHTPATH DOSE#1 BSS IO SOLN
INTRAOCULAR | Status: DC | PRN
  Administered 2021-12-01: 10:00:00 67 mL via OPHTHALMIC

## 2021-12-01 MED ORDER — SIGHTPATH DOSE#1 BSS IO SOLN
INTRAOCULAR | Status: DC | PRN
  Administered 2021-12-01: 15 mL

## 2021-12-01 MED ORDER — LACTATED RINGERS IV SOLN: INTRAVENOUS | Status: DC

## 2021-12-01 MED ORDER — SIGHTPATH DOSE#1 NA HYALUR & NA CHOND-NA HYALUR IO KIT
PACK | INTRAOCULAR | Status: DC | PRN
  Administered 2021-12-01: 1 via OPHTHALMIC

## 2021-12-01 MED ORDER — SIGHTPATH DOSE#1 BSS IO SOLN
INTRAOCULAR | Status: DC | PRN
  Administered 2021-12-01: 10:00:00 1 mL

## 2021-12-01 MED ORDER — MIDAZOLAM HCL 2 MG/2ML IJ SOLN
INTRAMUSCULAR | Status: DC | PRN
  Administered 2021-12-01: 1 mg via INTRAVENOUS

## 2021-12-01 MED ORDER — ARMC OPHTHALMIC DILATING DROPS
1.0000 "application " | OPHTHALMIC | Status: DC | PRN
  Administered 2021-12-01 (×3): 1 via OPHTHALMIC

## 2021-12-01 SURGICAL SUPPLY — 11 items
CANNULA ANT/CHMB 27G (MISCELLANEOUS) IMPLANT
CANNULA ANT/CHMB 27GA (MISCELLANEOUS) IMPLANT
GLOVE SRG 8 PF TXTR STRL LF DI (GLOVE) ×1 IMPLANT
GLOVE SURG ENC TEXT LTX SZ7.5 (GLOVE) ×3 IMPLANT
GLOVE SURG UNDER POLY LF SZ8 (GLOVE) ×3
LENS IOL TECNIS EYHANCE 23.5 (Intraocular Lens) ×2 IMPLANT
NDL FILTER BLUNT 18X1 1/2 (NEEDLE) ×1 IMPLANT
NEEDLE FILTER BLUNT 18X 1/2SAF (NEEDLE) ×2
NEEDLE FILTER BLUNT 18X1 1/2 (NEEDLE) ×1 IMPLANT
SYR 3ML LL SCALE MARK (SYRINGE) ×3 IMPLANT
WATER STERILE IRR 250ML POUR (IV SOLUTION) ×3 IMPLANT

## 2021-12-01 NOTE — Anesthesia Preprocedure Evaluation (Signed)
Anesthesia Evaluation  Patient identified by MRN, date of birth, ID band Patient awake    History of Anesthesia Complications Negative for: history of anesthetic complications  Airway Mallampati: II  TM Distance: >3 FB Neck ROM: Full    Dental no notable dental hx.    Pulmonary neg pulmonary ROS,    Pulmonary exam normal        Cardiovascular Exercise Tolerance: Good hypertension, Normal cardiovascular exam     Neuro/Psych CVA (05/2021, mild R sided weakness and facial droop)    GI/Hepatic negative GI ROS, Neg liver ROS,   Endo/Other  diabetes, Well Controlled, Type 2, Insulin Dependent  Renal/GU negative Renal ROS     Musculoskeletal   Abdominal   Peds  Hematology negative hematology ROS (+)   Anesthesia Other Findings   Reproductive/Obstetrics                            Anesthesia Physical Anesthesia Plan  ASA: 3  Anesthesia Plan: MAC   Post-op Pain Management: Minimal or no pain anticipated   Induction: Intravenous  PONV Risk Score and Plan: 2 and Treatment may vary due to age or medical condition, Midazolam and TIVA  Airway Management Planned: Nasal Cannula and Natural Airway  Additional Equipment: None  Intra-op Plan:   Post-operative Plan:   Informed Consent: I have reviewed the patients History and Physical, chart, labs and discussed the procedure including the risks, benefits and alternatives for the proposed anesthesia with the patient or authorized representative who has indicated his/her understanding and acceptance.       Plan Discussed with: CRNA  Anesthesia Plan Comments:         Anesthesia Quick Evaluation

## 2021-12-01 NOTE — Op Note (Signed)
OPERATIVE NOTE  Laura [name] [id number] 12/01/2021   PREOPERATIVE DIAGNOSIS:  Nuclear sclerotic cataract left eye. H25.12   POSTOPERATIVE DIAGNOSIS:    Nuclear sclerotic cataract left eye.     PROCEDURE:  Phacoemusification with posterior chamber intraocular lens placement of the left eye  Ultrasound time: Procedure(s): CATARACT EXTRACTION PHACO AND INTRAOCULAR LENS PLACEMENT (IOC) LEFT DIABETIC 3.10 00:41.8 (Left)  LENS:   Implant Name Type Inv. Item Serial No. Manufacturer Lot No. LRB No. Used Action  LENS IOL TECNIS EYHANCE 23.5 - [id number] Intraocular Lens LENS IOL TECNIS EYHANCE 23.5 [id number] [id number]   Left 1 Implanted      SURGEON:  Deirdre Evener, MD   ANESTHESIA:  Topical with tetracaine drops and 2% Xylocaine jelly, augmented with 1% preservative-free intracameral lidocaine.    COMPLICATIONS:  None.   DESCRIPTION OF PROCEDURE:  The patient was identified in the holding room and transported to the operating room and placed in the supine position under the operating microscope.  The left eye was identified as the operative eye and it was prepped and draped in the usual sterile ophthalmic fashion.   A 1 millimeter clear-corneal paracentesis was made at the 1:30 position.  0.5 ml of preservative-free 1% lidocaine was injected into the anterior chamber.  The anterior chamber was filled with Viscoat viscoelastic.  A 2.4 millimeter keratome was used to make a near-clear corneal incision at the 10:30 position.  .  A curvilinear capsulorrhexis was made with a cystotome and capsulorrhexis forceps.  Balanced salt solution was used to hydrodissect and hydrodelineate the nucleus.   Phacoemulsification was then used in stop and chop fashion to remove the lens nucleus and epinucleus.  The remaining cortex was then removed using the irrigation and aspiration handpiece. Provisc was then placed into the capsular bag to distend it for lens placement.  A lens was then injected into  the capsular bag.  The remaining viscoelastic was aspirated.   Wounds were hydrated with balanced salt solution.  The anterior chamber was inflated to a physiologic pressure with balanced salt solution.  No wound leaks were noted. Cefuroxime 0.1 ml of a 10mg /ml solution was injected into the anterior chamber for a dose of 1 mg of intracameral antibiotic at the completion of the case.   Timolol and Brimonidine drops were applied to the eye.  The patient was taken to the recovery room in stable condition without complications of anesthesia or surgery.  [name] 12/01/2021, 9:54 AM

## 2021-12-01 NOTE — Transfer of Care (Signed)
Immediate Anesthesia Transfer of Care Note  Patient: Laura [name]  Procedure(s) Performed: CATARACT EXTRACTION PHACO AND INTRAOCULAR LENS PLACEMENT (IOC) LEFT DIABETIC 3.10 00:41.8 (Left: Eye)  Patient Location: PACU  Anesthesia Type: MAC  Level of Consciousness: awake, alert  and patient cooperative  Airway and Oxygen Therapy: Patient Spontanous Breathing and Patient connected to supplemental oxygen  Post-op Assessment: Post-op Vital signs reviewed, Patient's Cardiovascular Status Stable, Respiratory Function Stable, Patent Airway and No signs of Nausea or vomiting  Post-op Vital Signs: Reviewed and stable  Complications: No notable events documented.

## 2021-12-01 NOTE — Anesthesia Procedure Notes (Signed)
Procedure Name: MAC Date/Time: 12/01/2021 9:32 AM Performed by: Mayme Genta, CRNA Pre-anesthesia Checklist: Patient identified, Emergency Drugs available, Suction available, Timeout performed and Patient being monitored Patient Re-evaluated:Patient Re-evaluated prior to induction Oxygen Delivery Method: Nasal cannula Placement Confirmation: positive ETCO2

## 2021-12-01 NOTE — Therapy (Signed)
[facility] MAIN [facility] SERVICES [address] [place], [place], [address] Phone: [phone]   Fax:  [phone]  Speech Language Pathology Treatment  Patient Details  Name: Laura [name] MRN: [id number] Date of Birth: 06-28-1951 Referring Provider (SLP): Mariam Dollar, PA-C   Encounter Date: 11/30/2021   End of Session - 12/01/21 0944     Visit Number 36    Number of Visits 47    Date for SLP Re-Evaluation 01/05/22    Authorization Type [employer]    Authorization - Visit Number 6    Progress Note Due on Visit 10    SLP Start Time 1300    SLP Stop Time  1400    SLP Time Calculation (min) 60 min    Activity Tolerance Patient tolerated treatment well             Past Medical History:  Diagnosis Date   Acute CVA (cerebrovascular accident) (HCC) 05/24/2021   Diabetes mellitus without complication (HCC) 05/23/2021   Hyperlipidemia 05/23/2021   Hypertension 05/23/2021   Left forearm fracture    in high school--was casted   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   Foot fracture, no surgery   TONSILLECTOMY      There were no vitals filed for this visit.   Subjective Assessment - 12/01/21 0938     Subjective Has second cataract surgery tomorrow    Patient is accompained by: Family member   sister [name]   Currently in Pain? No/denies                   ADULT SLP TREATMENT - 12/01/21 0939       General Information   Behavior/Cognition Alert;Cooperative;Pleasant mood    HPI Laura [name] is a 70 year old female with past medical hx noted  for DM II, HLD,   admitted at [facility] on 05/23/21 with garbled speech, right facial droop, vision changes and  RUE weakness. MRI brain showed acute infarct in left caudate head and anterior lentiform nucleus, chronic microvascular ischemia. Resulting transcortical motor aphasia, dysarthria and cognitive deficits. Blood sugar was 372 on admission. Admitted to CIR 6/22- 06/02/21.      Cognitive-Linquistic Treatment   Treatment focused on Aphasia;Cognition    Skilled Treatment Focused session on verbal expression and conversation. In mod complex conversation re: Christmas gifts, winter weather over the holidays, pt demonstrated adequate comprehension/processing and also initiated asking questions with improved distribution of conversational burden (60:40 SLP:Pt). Patient also initiated conversation re: objects in view (structures of speech and swallowing poster), asking questions re: anatomy and her stroke. SLP reviewed pt's MRI and explained area of infarct as well as common deficits related to speech and swallowing when stroke occurs in this area.      Assessment / Recommendations / Plan   Plan Continue with current plan of care      Progression Toward Goals   Progression toward goals Progressing toward goals              SLP Education - 12/01/21 0939     Education Details BE FAST (stroke signs)  Person(s) Educated Patient    Methods Explanation    Comprehension Verbalized understanding              SLP Short Term Goals - 11/22/21 1149       SLP SHORT TERM GOAL #8   Title Patient will repeat unintelligible speech with slower rate in at least 3/5 opportunites with min cues (facial expression) in 5-8 minutes conversation.    Time 10    Period --   sessions   Status Revised      SLP SHORT TERM GOAL  #9   TITLE Patient will take active role in conversation by asking at least 3 questions or requesting repetition in 10 minute discussion with min cues.    Baseline 11/01/21:  progressing visual structure/written cues required    Time 10    Period --   sessions   Status Revised              SLP Long Term Goals - 11/22/21 1152       SLP LONG TERM GOAL #1   Title Patient will use compensations for aphasia and dysarthria in 10 minutes moderately complex conversation to express thoughts and opinions with listener comprehension at least 90%.    Time 12    Period Weeks    Status On-going    Target Date 01/05/22      SLP LONG TERM GOAL #3   Title Pt will read paragraph-level materials relating to personal interests, financial and medical matters demonstrating comprehension of details >90% accuracy.    Time 12    Period Weeks    Status On-going    Target Date 01/05/22      SLP LONG TERM GOAL #4   Title Pt will demonstrate comprehension of verbal information related to health conditions or personal interests by paraphrasing details at least 80% accuracy.    Time 12    Period Weeks    Status Revised    Target Date 01/05/22      SLP LONG TERM GOAL #5   Title Patient will repeat unintelligible speech with min visual cues in a noisy/distracting environment at least 80% of the time.    Time 12    Period Weeks    Target Date 02/20/22      Additional Long Term Goals   Additional Long Term Goals Yes      SLP LONG TERM GOAL #6   Title Pt will use external aids to manage appointments, chores, shopping, and log vital signs with mod I.    Time 12    Period Weeks    Status New    Target Date 02/20/22              Plan - 12/01/21 0945     Clinical Impression Statement Laura [name] presents with mild anomic aphasia, mild dysarthria, and mild-moderate cognitive deficits s/p CVA. Patient demonstrating improved participation in conversation today, beginning to raise conversation topics re: objects in view. Continue skilled ST to address aphasia, dysarthria, and cognitive-communication deficits in order to improve ability to communicate wants and needs and  increase pt independence, as pt goal is to return to living independently.    Speech Therapy Frequency 2x / week    Duration 12 weeks    Treatment/Interventions Language facilitation;Environmental controls;Cueing hierarchy;SLP instruction and feedback;Compensatory techniques;Cognitive reorganization;Functional tasks;Compensatory strategies;Internal/external aids;Multimodal communcation approach;Patient/family education    Potential to Achieve Goals Good    Consulted and Agree with Plan of Care Patient;Family member/caregiver  Patient will benefit from skilled therapeutic intervention in order to improve the following deficits and impairments:   Aphasia  Cognitive communication deficit  Dysarthria and anarthria    Problem List 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) 06/02/2021   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 (CVA) 06/02/2021   Vitamin D deficiency 06/02/2021   Type II diabetes mellitus with complication (HCC)    Vitamin B12 deficiency    Essential hypertension 05/23/2021   Eyesight diminished 12/05/2020   Diminished hearing 12/05/2020   Rondel Baton, MS, CCC-SLP Speech-Language Pathologist  Arlana Lindau, CCC-SLP 12/01/2021, 9:46 AM  [facility] MAIN [facility] SERVICES [address] [place], [place], [address] Phone: [phone]   Fax:  [phone]   Name: Laura [name] MRN: [id number] Date of Birth: 1951-03-30

## 2021-12-01 NOTE — Anesthesia Postprocedure Evaluation (Signed)
Anesthesia Post Note  Patient: Laura [name]  Procedure(s) Performed: CATARACT EXTRACTION PHACO AND INTRAOCULAR LENS PLACEMENT (IOC) LEFT DIABETIC 3.10 00:41.8 (Left: Eye)     Patient location during evaluation: PACU Anesthesia Type: MAC Level of consciousness: awake and alert Pain management: pain level controlled Vital Signs Assessment: post-procedure vital signs reviewed and stable Respiratory status: spontaneous breathing Cardiovascular status: blood pressure returned to baseline Postop Assessment: no apparent nausea or vomiting, adequate PO intake and no headache Anesthetic complications: no   No notable events documented.  Adele Barthel Roderic Lammert

## 2021-12-01 NOTE — H&P (Signed)
[facility]   Primary Care Physician:  Reubin Milan, MD Ophthalmologist: Dr. Lockie Mola  Pre-Procedure History & Physical: HPI:  Laura [name] is a 70 y.o. female here for ophthalmic surgery.   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   Foot fracture, no surgery   TONSILLECTOMY      Prior to Admission medications   Medication Sig Start Date End Date Taking? Authorizing Provider  Accu-Chek Softclix Lancets lancets Use twice a day 07/05/21  Yes Reubin Milan, MD  amLODipine (NORVASC) 5 MG tablet TAKE 1 TABLET (5 MG TOTAL) BY MOUTH DAILY. 11/26/21  Yes Reubin Milan, MD  aspirin 81 MG EC tablet Take 1 tablet (81 mg total) by mouth daily. Swallow whole. 06/02/21  Yes Love, Evlyn Kanner, PA-C  atorvastatin (LIPITOR) 80 MG tablet TAKE 1 TABLET BY MOUTH EVERY EVENING 11/26/21  Yes Reubin Milan, MD  cyanocobalamin 1000 MCG tablet Take 1 tablet (1,000 mcg total) by mouth daily. 06/24/21  Yes Reubin Milan, MD  glucose blood (ACCU-CHEK GUIDE) test strip Test blood sugar twice a day 07/05/21  Yes Reubin Milan, MD  Insulin Glargine [facility]) 100 UNIT/ML Inject 19 Units into the skin daily. 10/27/21  Yes Reubin Milan, MD  metFORMIN (GLUCOPHAGE) 850 MG tablet Take 1 tablet (850 mg total) by mouth 2 (two) times daily  with a meal. 10/27/21  Yes Reubin Milan, MD  polyethylene glycol (MIRALAX / GLYCOLAX) 17 g packet Take 17 g by mouth daily as needed for moderate constipation. 05/26/21  Yes Wieting, Richard, MD  Vitamin D, Ergocalciferol, (DRISDOL) 1.25 MG (50000 UNIT) CAPS capsule Take 1 capsule (50,000 Units total) by mouth every 7 (seven) days. 06/24/21  Yes Reubin Milan, MD  BD PEN NEEDLE MICRO U/F 32G X 6 MM MISC USE DAILY AS DIRECTED 11/23/21   Reubin Milan, MD    Allergies as of 11/16/2021   (No Known Allergies)    Family History  Problem Relation Age of Onset   Hypertension Mother    Diabetes Mother    Arthritis Mother    Heart disease Mother    Heart failure Father    Stroke Father    COPD Father    Hypertension Sister    Arthritis Sister    Heart disease Maternal Grandfather    Heart disease Maternal Grandmother    Stroke Paternal Grandfather    Stroke Paternal Grandmother    Arthritis Sister    Hypertension Sister    Cancer Maternal Aunt    Diabetes Maternal Aunt    Cancer Paternal Aunt    Hypertension Sister    Varicose Veins Sister     Social History   Socioeconomic History   Marital status: Single    Spouse name: Not on file   Number of children: Not on file   Years of  education: Not on file   Highest education level: Not on file  Occupational History   Not on file  Tobacco Use   Smoking status: Never    Passive exposure: Never   Smokeless tobacco: Never  Vaping Use   Vaping Use: Never used  Substance and Sexual Activity   Alcohol use: Never   Drug use: Never   Sexual activity: Not Currently    Birth control/protection: None  Other Topics Concern   Not on file  Social History Narrative   Not on file   Social Determinants of Health   Financial Resource Strain: Low Risk    Difficulty of Paying Living Expenses: Not hard at all  Food Insecurity: No Food Insecurity   Worried About Programme researcher, broadcasting/film/video in the Last Year: Never true   Ran Out of  Food in the Last Year: Never true  Transportation Needs: No Transportation Needs   Lack of Transportation (Medical): No   Lack of Transportation (Non-Medical): No  Physical Activity: Insufficiently Active   Days of Exercise per Week: 4 days   Minutes of Exercise per Session: 30 min  Stress: No Stress Concern Present   Feeling of Stress : Not at all  Social Connections: Socially Isolated   Frequency of Communication with Friends and Family: Never   Frequency of Social Gatherings with Friends and Family: Never   Attends Religious Services: Never   Database administrator or Organizations: No   Attends Engineer, structural: Never   Marital Status: Never married  Catering manager Violence: Not At Risk   Fear of Current or Ex-Partner: No   Emotionally Abused: No   Physically Abused: No   Sexually Abused: No    Review of Systems: See HPI, otherwise negative ROS  Physical Exam: BP 120/63    Pulse 88    Temp (!) 97 F (36.1 C) (Temporal)    Resp 18    Ht 5\' 8"  (1.727 m)    Wt 60.8 kg    SpO2 100%    BMI 20.37 kg/m  General:   Alert,  pleasant and cooperative in NAD Head:  Normocephalic and atraumatic. Lungs:  Clear to auscultation.    Heart:  Regular rate and rhythm.   Impression/Plan: Laura [name] is here for ophthalmic surgery.  Risks, benefits, limitations, and alternatives regarding ophthalmic surgery have been reviewed with the patient.  Questions have been answered.  All parties agreeable.   [name], MD  12/01/2021, 8:55 AM

## 2021-12-02 ENCOUNTER — Ambulatory Visit: Payer: Medicare Other | Admitting: Speech Pathology

## 2021-12-02 ENCOUNTER — Encounter: Payer: Self-pay | Admitting: Ophthalmology

## 2021-12-02 ENCOUNTER — Ambulatory Visit: Payer: Medicare Other

## 2021-12-05 ENCOUNTER — Other Ambulatory Visit: Payer: Self-pay | Admitting: Physical Medicine and Rehabilitation

## 2021-12-05 DIAGNOSIS — E118 Type 2 diabetes mellitus with unspecified complications: Secondary | ICD-10-CM

## 2021-12-06 ENCOUNTER — Other Ambulatory Visit: Payer: Self-pay | Admitting: Internal Medicine

## 2021-12-07 ENCOUNTER — Encounter: Payer: Medicare Other | Admitting: Occupational Therapy

## 2021-12-07 ENCOUNTER — Encounter: Payer: Medicare Other | Admitting: Speech Pathology

## 2021-12-07 ENCOUNTER — Ambulatory Visit: Payer: Medicare Other | Admitting: Physical Therapy

## 2021-12-07 NOTE — Telephone Encounter (Signed)
Requested medication (s) are due for refill today: no  Requested medication (s) are on the active medication list: no  Last refill: 09/10/21  Future visit scheduled: yes  Notes to clinic:  Please review for refill. Refill not delegated per protocol. Medication not on current med list.     Requested Prescriptions  Pending Prescriptions Disp Refills   vitamin B-12 (CYANOCOBALAMIN) 1000 MCG tablet [Pharmacy Med Name: VITAMIN B-12 1,000 MCG TABLET] 90 tablet 1    Sig: TAKE 1 TABLET BY MOUTH EVERY DAY     Off-Protocol Failed - 12/06/2021  1:34 AM      Failed - Medication not assigned to a protocol, review manually.      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

## 2021-12-08 ENCOUNTER — Encounter: Payer: Self-pay | Admitting: Internal Medicine

## 2021-12-08 ENCOUNTER — Other Ambulatory Visit: Payer: Self-pay

## 2021-12-08 MED ORDER — VITAMIN D (ERGOCALCIFEROL) 1.25 MG (50000 UNIT) PO CAPS: 50000.0000 [IU] | ORAL_CAPSULE | ORAL | 0 refills | Status: DC

## 2021-12-09 ENCOUNTER — Encounter: Payer: Medicare Other | Admitting: Occupational Therapy

## 2021-12-09 ENCOUNTER — Other Ambulatory Visit: Payer: Self-pay

## 2021-12-09 ENCOUNTER — Ambulatory Visit: Payer: Medicare Other | Admitting: Physical Therapy

## 2021-12-09 ENCOUNTER — Ambulatory Visit: Payer: Medicare Other | Attending: Physician Assistant | Admitting: Speech Pathology

## 2021-12-09 DIAGNOSIS — R41841 Cognitive communication deficit: Secondary | ICD-10-CM

## 2021-12-09 DIAGNOSIS — R471 Dysarthria and anarthria: Secondary | ICD-10-CM | POA: Diagnosis present

## 2021-12-09 DIAGNOSIS — R4701 Aphasia: Secondary | ICD-10-CM | POA: Diagnosis present

## 2021-12-09 NOTE — Patient Instructions (Signed)
Local Driver Evaluation Programs: ° °Comprehensive Evaluation: includes clinical and in vehicle behind the wheel testing by OCCUPATIONAL THERAPIST. Programs have varying levels of adaptive controls available for trial.  ° °[facility] °[address] °[place], [place]  [address] °[phone] or [phone] °http://www.driver-rehab.com °Evaluator:  Cyndee Crompton, OT/CDRS/CDI/SCDCM/Low Vision Certification ° °[facility] °[address] °[place], [place] [address] °[phone] °https://www.[facility].org/home/services/rehabilitation.aspx °Evaluators:  Shannon Sheek, OT and Jill Tucker, OT ° °[facility] [place] [facility] (ONLY SERVES VETERANS!!) °[facility] °[address] °[place], [place]  [address] °[phone] °http://www.[place].va.gov/services/Physical_Medicine_Rehabilitation_Services.asp °Evaluators:  Eric Andrews, KT; Heidi Harris, KT;  Gary Whitaker, KT (KT=kiniesotherapist) ° ° °Clinical evaluations only:  Includes clinical testing, refers to other programs or local certified driving instructor for behind the wheel testing. ° °[facility] (outpatient Rehab) °[facility] °[address] °[place], [place] [address] °[phone] for scheduling °http://www.[facility]edu/Outpatient-Rehabilitation/Neurorehabilitation-Therapy.htm °Evaluators:  Kelly Lambeth, OT; Kate Phillips, OT ° °Other area clinical evaluators available upon request including [facility], [facility] and [facility]. ° ° °    Resource List °What is a Driver Evaluation: °Your Road Ahead - A Guide to Comprehensive Driving Evaluations °http://www.thehartford.com/resources/mature-market-excellence/publications-on-aging ° °Association for [employer] Services - Disability and Driving Fact Sheets °http://www.aded.net/?page=510 ° °Driving after a Brain  Injury: °[employer] °http://www.biausa.org/tbims-abstracts/if-there-is-an-effective-way-to-determine-if-someone-is-ready-to-drive-after-tbi?A=SearchResult&SearchID=9495675&ObjectID=2758842&ObjectType=35 ° °Driving with Adaptive Equipment: °[facility] Process °http://www.driver-rehab.com/adaptive-equipment ° °[employer] Mob[employer] °http://www.nmeda.com/ ° ° ° ° ° ° °  °

## 2021-12-09 NOTE — Therapy (Signed)
[place] [facility] [address] , [place], [address] Phone: [phone]   Fax:  [phone]  Speech Language Pathology Treatment  Patient Details  Name: Laura GurneyWanda Shimizu MRN: [id number] Date of Birth: 04/05/1951 Referring Provider (SLP): Mariam Dollaraniel Angiulli, PA-C   Encounter Date: 12/09/2021   End of Session - 12/09/21 1731     Visit Number 37    Number of Visits 47    Date for SLP Re-Evaluation 01/05/22    Authorization Type [employer]    Authorization - Visit Number 7    Progress Note Due on Visit 10    SLP Start Time 1300    SLP Stop Time  1400    SLP Time Calculation (min) 60 min             Past Medical History:  Diagnosis Date   Acute CVA (cerebrovascular accident) (HCC) 05/24/2021   Diabetes mellitus without complication (HCC) 05/23/2021   Hyperlipidemia 05/23/2021   Hypertension 05/23/2021   Left forearm fracture    in high school--was casted   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   Stroke (HCC) 05/23/2021    Past Surgical History:  Procedure Laterality Date   CATARACT EXTRACTION W/PHACO Right 11/10/2021   Procedure: CATARACT EXTRACTION PHACO AND INTRAOCULAR LENS PLACEMENT (IOC) RIGHT DIABETIC  22.44 02:07.2;  Surgeon: Lockie MolaBrasington, Chadwick, MD;  [facility];  Service: Ophthalmology;  Laterality: Right;   CATARACT EXTRACTION W/PHACO Left 12/01/2021   Procedure: CATARACT EXTRACTION PHACO AND INTRAOCULAR LENS PLACEMENT (IOC) LEFT DIABETIC 3.10 00:41.8;  Surgeon: Lockie MolaBrasington, Chadwick, MD;  [facility];  Service: Ophthalmology;  Laterality: Left;   FOOT SURGERY     FRACTURE SURGERY  ?   Foot fracture, no surgery   TONSILLECTOMY      There were no vitals filed for this visit.   Subjective Assessment - 12/09/21 1721     Subjective Reports good results from second cataract surgery    Patient is accompained by: Family member   sister Elease Hashimotoatricia   Currently in Pain? No/denies                    ADULT SLP TREATMENT - 12/09/21 1721       General Information   Behavior/Cognition Alert;Cooperative;Pleasant mood    HPI Laura GurneyWanda Cwynar is a 71 year old female with past medical hx noted for DM II, HLD,   [facility] with garbled speech, right facial droop, vision changes and  RUE weakness. MRI brain showed acute infarct in left caudate head and anterior lentiform nucleus, chronic microvascular ischemia. Resulting transcortical motor aphasia, dysarthria and cognitive deficits. Blood sugar was 372 on admission. Admitted to CIR 6/22- 06/02/21.      Cognitive-Linquistic Treatment   Treatment focused on Aphasia;Cognition    Skilled Treatment Facilitated mod complex conversation re: pt's cataract surgery and recovery, home routines. Pt with notably improved affect, initiating conversation, topic changes, and asking questions throughout conversation today. Required rare min cues/encouragement when hesitation/anomia occurred. Required verbal cues x3 for repetition of unintelligible speech. Discussed with pt/sister use of calendar for pt to begin keeping track of her appointment schedule. Pt to review calendars at home and select one she would like to use. Pt expressing interest in driving; recommended pt have driving evaluation to assess safety behind the wheel due to cognitive deficits and right UE weakness.      Assessment / Recommendations / Plan   Plan Continue with current plan of care  Progression Toward Goals   Progression toward goals Progressing toward goals              SLP Education - 12/09/21 1731     Education Details Driving evaluation information (cognitive deficits, reaction time may impact safety behind the wheel)    Person(s) Educated Patient;Other (comment)   sister   Methods Explanation;Handout    Comprehension Verbalized understanding              SLP Short Term Goals - 11/22/21 1149       SLP SHORT TERM GOAL #8    Title Patient will repeat unintelligible speech with slower rate in at least 3/5 opportunites with min cues (facial expression) in 5-8 minutes conversation.    Time 10    Period --   sessions   Status Revised      SLP SHORT TERM GOAL  #9   TITLE Patient will take active role in conversation by asking at least 3 questions or requesting repetition in 10 minute discussion with min cues.    Baseline 11/01/21: progressing visual structure/written cues required    Time 10    Period --   sessions   Status Revised              SLP Long Term Goals - 11/22/21 1152       SLP LONG TERM GOAL #1   Title Patient will use compensations for aphasia and dysarthria in 10 minutes moderately complex conversation to express thoughts and opinions with listener comprehension at least 90%.    Time 12    Period Weeks    Status On-going    Target Date 01/05/22      SLP LONG TERM GOAL #3   Title Pt will read paragraph-level materials relating to personal interests, financial and medical matters demonstrating comprehension of details >90% accuracy.    Time 12    Period Weeks    Status On-going    Target Date 01/05/22      SLP LONG TERM GOAL #4   Title Pt will demonstrate comprehension of verbal information related to health conditions or personal interests by paraphrasing details at least 80% accuracy.    Time 12    Period Weeks    Status Revised    Target Date 01/05/22      SLP LONG TERM GOAL #5   Title Patient will repeat unintelligible speech with min visual cues in a noisy/distracting environment at least 80% of the time.    Time 12    Period Weeks    Target Date 02/20/22      Additional Long Term Goals   Additional Long Term Goals Yes      SLP LONG TERM GOAL #6   Title Pt will use external aids to manage appointments, chores, shopping, and log vital signs with mod I.    Time 12    Period Weeks    Status New    Target Date 02/20/22              Plan - 12/09/21 1732      Clinical Impression Statement Nyrie Sigal presents with mild anomic aphasia, mild dysarthria, and mild-moderate cognitive deficits s/p CVA. Patient conversation continues to improve, largely functional for mod complex conversation today. Pt initiating in conversation more frequently, including bids for information, appropriate topic-shifting, and observations. Improving initiation in ADLs per sister; has begun retrieving some of her own items on shopping trips. Pt interested in returning to driving with vision  improvements, encouraged pt to discuss with MD and that OT driving evaluation is recommended due to slower processing and cognitive deficits. Information on this was provided in handout form. Continue skilled ST to address aphasia, dysarthria, and cognitive-communication deficits in order to improve ability to communicate wants and needs and increase pt independence, as pt goal is to return to living independently.    Speech Therapy Frequency 2x / week    Duration 12 weeks    Treatment/Interventions Language facilitation;Environmental controls;Cueing hierarchy;SLP instruction and feedback;Compensatory techniques;Cognitive reorganization;Functional tasks;Compensatory strategies;Internal/external aids;Multimodal communcation approach;Patient/family education    Potential to Achieve Goals Good    Consulted and Agree with Plan of Care Patient;Family member/caregiver             Patient will benefit from skilled therapeutic intervention in order to improve the following deficits and impairments:   Cognitive communication deficit  Dysarthria and anarthria  Aphasia    Problem List 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 [place], [place], [address] Phone: [phone]   Fax:  [phone]   Name: Janaia Kozel MRN: [id number] Date of Birth: 06/23/1951

## 2021-12-14 ENCOUNTER — Other Ambulatory Visit: Payer: Self-pay

## 2021-12-14 ENCOUNTER — Encounter: Payer: Medicare Other | Admitting: Occupational Therapy

## 2021-12-14 ENCOUNTER — Ambulatory Visit: Payer: Medicare Other | Admitting: Physical Therapy

## 2021-12-14 ENCOUNTER — Ambulatory Visit: Payer: Medicare Other | Admitting: Speech Pathology

## 2021-12-14 DIAGNOSIS — R41841 Cognitive communication deficit: Secondary | ICD-10-CM

## 2021-12-14 DIAGNOSIS — R471 Dysarthria and anarthria: Secondary | ICD-10-CM

## 2021-12-14 DIAGNOSIS — R4701 Aphasia: Secondary | ICD-10-CM

## 2021-12-15 NOTE — Therapy (Signed)
[place] [facility] MAIN [facility] SERVICES [address] [place], [place], [address] Phone: [phone]   Fax:  [phone]  Speech Language Pathology Treatment  Patient Details  Name: Laura [name] MRN: [id number] Date of Birth: [birth year] Referring Provider (SLP): Mariam Dollar, PA-C   Encounter Date: 12/14/2021   End of Session - 12/15/21 0847     Visit Number 38    Number of Visits 47    Date for SLP Re-Evaluation 01/05/22    Authorization Type [employer]    Authorization - Visit Number 8    Progress Note Due on Visit 10             Past Medical History:  Diagnosis Date   Acute CVA (cerebrovascular accident) (HCC) 05/24/2021   Diabetes mellitus without complication (HCC) 05/23/2021   Hyperlipidemia 05/23/2021   Hypertension 05/23/2021   Left forearm fracture    in high school--was casted   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   Stroke (HCC) 05/23/2021    Past Surgical History:  Procedure Laterality Date   CATARACT EXTRACTION W/PHACO Right 11/10/2021   Procedure: CATARACT EXTRACTION PHACO AND INTRAOCULAR LENS PLACEMENT (IOC) RIGHT DIABETIC  22.44 02:07.2;  Surgeon: Lockie Mola, MD;  [facility];  Service: Ophthalmology;  Laterality: Right;   CATARACT EXTRACTION W/PHACO Left 12/01/2021   Procedure: CATARACT EXTRACTION PHACO AND INTRAOCULAR LENS PLACEMENT (IOC) LEFT DIABETIC 3.10 00:41.8;  Surgeon: Lockie Mola, MD;  Location[facility];  Service: Ophthalmology;  Laterality: Left;   FOOT SURGERY     FRACTURE SURGERY  ?   Foot fracture, no surgery   TONSILLECTOMY      There were no vitals filed for this visit.   Subjective Assessment - 12/15/21 0842     Subjective Patient brought calendars today.    Patient is accompained by: Family member   sister [name]   Currently in Pain? No/denies                   ADULT SLP TREATMENT - 12/15/21 0843       General Information    Behavior/Cognition Alert;Cooperative;Pleasant mood    HPI Laura [name] is a 71 year old female with past medical hx noted for DM II, HLD,   admitted at [facility] on 05/23/21 with garbled speech, right facial droop, vision changes and  RUE weakness. MRI brain showed acute infarct in left caudate head and anterior lentiform nucleus, chronic microvascular ischemia. Resulting transcortical motor aphasia, dysarthria and cognitive deficits. Blood sugar was 372 on admission. Admitted to CIR 6/22- 06/02/21.      Cognitive-Linquistic Treatment   Treatment focused on Aphasia;Cognition    Skilled Treatment SLP facilitated mod complex conversation with pt introducing herself and sharing information about her medical/personal history with graduate student clinician. Patient appeared shy with presence of unfamiliar person, however engaged appropriately and used compensations for aphasia with min cue x1 (slow rate). Reviewed 4 calendars patient brought; she was unable to pick which one she wanted to use at home. Facilitated decision-making by having pt identify similarities and differences between calendars and ultimately she was able to make a selection. With occasional min cues, pt added personally relevant information (family birth/death dates) and appointment information. Required extended time, however demonstrated appropriate attention and perserverance with task.      Assessment / Recommendations / Plan   Plan Continue with current plan of care      Progression Toward Goals   Progression toward goals Progressing toward goals  SLP Education - 12/15/21 0855     Education Details use of calendar    Person(s) Educated Patient    Methods Explanation    Comprehension Verbalized understanding;Need further instruction              SLP Short Term Goals - 11/22/21 1149       SLP SHORT TERM GOAL #8   Title Patient will repeat unintelligible speech with slower rate in at least 3/5 opportunites  with min cues (facial expression) in 5-8 minutes conversation.    Time 10    Period --   sessions   Status Revised      SLP SHORT TERM GOAL  #9   TITLE Patient will take active role in conversation by asking at least 3 questions or requesting repetition in 10 minute discussion with min cues.    Baseline 11/01/21: progressing visual structure/written cues required    Time 10    Period --   sessions   Status Revised              SLP Long Term Goals - 11/22/21 1152       SLP LONG TERM GOAL #1   Title Patient will use compensations for aphasia and dysarthria in 10 minutes moderately complex conversation to express thoughts and opinions with listener comprehension at least 90%.    Time 12    Period Weeks    Status On-going    Target Date 01/05/22      SLP LONG TERM GOAL #3   Title Pt will read paragraph-level materials relating to personal interests, financial and medical matters demonstrating comprehension of details >90% accuracy.    Time 12    Period Weeks    Status On-going    Target Date 01/05/22      SLP LONG TERM GOAL #4   Title Pt will demonstrate comprehension of verbal information related to health conditions or personal interests by paraphrasing details at least 80% accuracy.    Time 12    Period Weeks    Status Revised    Target Date 01/05/22      SLP LONG TERM GOAL #5   Title Patient will repeat unintelligible speech with min visual cues in a noisy/distracting environment at least 80% of the time.    Time 12    Period Weeks    Target Date 02/20/22      Additional Long Term Goals   Additional Long Term Goals Yes      SLP LONG TERM GOAL #6   Title Pt will use external aids to manage appointments, chores, shopping, and log vital signs with mod I.    Time 12    Period Weeks    Status New    Target Date 02/20/22              Plan - 12/15/21 0855     Clinical Impression Statement Laura [name] presents with mild anomic aphasia, mild dysarthria, and  mild-moderate cognitive deficits s/p CVA. Patient conversation continues to improve, largely functional for mod complex conversation today. Pt initiating in conversation more frequently, including bids for information, appropriate topic-shifting, and observations. Improving initiation in ADLs per sister; has begun retrieving some of her own items on shopping trips. Initiated training in use of calendar for managing daily schedule; patient responsive to intervention. Continue skilled ST to address aphasia, dysarthria, and cognitive-communication deficits in order to improve ability to communicate wants and needs and increase pt independence, as pt goal is to  return to living independently.    Speech Therapy Frequency 2x / week    Duration 12 weeks    Treatment/Interventions Language facilitation;Environmental controls;Cueing hierarchy;SLP instruction and feedback;Compensatory techniques;Cognitive reorganization;Functional tasks;Compensatory strategies;Internal/external aids;Multimodal communcation approach;Patient/family education    Potential to Achieve Goals Good    Consulted and Agree with Plan of Care Patient;Family member/caregiver             Patient will benefit from skilled therapeutic intervention in order to improve the following deficits and impairments:   Aphasia  Cognitive communication deficit  Dysarthria and anarthria    Problem List 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 [place], [place], [address] Phone: [phone]   Fax:  [phone]   Name: Laura GurneyWanda [name] MRN: [id number] Date of Birth: 20-Apr-1951

## 2021-12-16 ENCOUNTER — Ambulatory Visit: Payer: Medicare Other | Admitting: Physical Therapy

## 2021-12-16 ENCOUNTER — Other Ambulatory Visit: Payer: Self-pay

## 2021-12-16 ENCOUNTER — Encounter: Payer: Medicare Other | Admitting: Occupational Therapy

## 2021-12-16 ENCOUNTER — Encounter: Payer: Medicare Other | Admitting: Physical Medicine and Rehabilitation

## 2021-12-16 ENCOUNTER — Ambulatory Visit: Payer: Medicare Other | Admitting: Speech Pathology

## 2021-12-16 DIAGNOSIS — R41841 Cognitive communication deficit: Secondary | ICD-10-CM

## 2021-12-16 DIAGNOSIS — R471 Dysarthria and anarthria: Secondary | ICD-10-CM

## 2021-12-16 DIAGNOSIS — R4701 Aphasia: Secondary | ICD-10-CM

## 2021-12-16 NOTE — Therapy (Signed)
[facility] MAIN [facility] SERVICES [address] [place], [place], [address] Phone: [phone]   Fax:  [phone]  Speech Language Pathology Treatment  Patient Details  Name: Laura [name] MRN: [id number] Date of Birth: 1951-10-21 Referring Provider (SLP): Mariam Dollar, PA-C   Encounter Date: 12/16/2021   End of Session - 12/16/21 1415     Visit Number 39    Number of Visits 47    Date for SLP Re-Evaluation 01/05/22    Authorization Type [employer]    Authorization - Visit Number 9    Progress Note Due on Visit 10    SLP Start Time 1300    SLP Stop Time  1400    SLP Time Calculation (min) 60 min    Activity Tolerance Patient tolerated treatment well             Past Medical History:  Diagnosis Date   Acute CVA (cerebrovascular accident) (HCC) 05/24/2021   Diabetes mellitus without complication (HCC) 05/23/2021   Hyperlipidemia 05/23/2021   Hypertension 05/23/2021   Left forearm fracture    in high school--was casted   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   Stroke (HCC) 05/23/2021    Past Surgical History:  Procedure Laterality Date   CATARACT EXTRACTION W/PHACO Right 11/10/2021   Procedure: CATARACT EXTRACTION PHACO AND INTRAOCULAR LENS PLACEMENT (IOC) RIGHT DIABETIC  22.44 02:07.2;  Surgeon: Lockie Mola, MD;  Location: [facility];  Service: Ophthalmology;  Laterality: Right;   CATARACT EXTRACTION W/PHACO Left 12/01/2021   Procedure: CATARACT EXTRACTION PHACO AND INTRAOCULAR LENS PLACEMENT (IOC) LEFT DIABETIC 3.10 00:41.8;  Surgeon: Lockie Mola, MD;  Location: [facility];  Service: Ophthalmology;  Laterality: Left;   FOOT SURGERY     FRACTURE SURGERY  ?   Foot fracture, no surgery   TONSILLECTOMY      There were no vitals filed for this visit.   Subjective Assessment - 12/16/21 1413     Subjective Brought new phone and manual    Patient is accompained by: Family member   brother-in-law  Laura [name]   Currently in Pain? No/denies                   ADULT SLP TREATMENT - 12/16/21 1414       General Information   Behavior/Cognition Alert;Cooperative;Pleasant mood    HPI Laura [name] is a 71 year old female with past medical hx noted for DM II, HLD,   admitted at [facility] on [facility]/23/21 with garbled speech, right facial droop, vision changes and  RUE weakness. MRI brain showed acute infarct in left caudate head and anterior lentiform nucleus, chronic microvascular ischemia. Resulting transcortical motor aphasia, dysarthria and cognitive deficits. Blood sugar was 372 on admission. Admitted to [facility] 6/22- 06/02/21.      Cognitive-Linquistic Treatment   Treatment focused on Aphasia;Cognition    Skilled Treatment Session focused on comprehension of written materials (phone manual) and use of external aids for management of appointments. With occasional moderate cues, pt used manual to identify and then follow steps to read and send an email. Trained patient in use of text-to-speech to expedite expressive communication, with pt requiring usual min cues to increase vocal intensity for clarity. Pt used text-to-speech to send sentence-level text messages to sister with occasional mod cues. Utilized calendar for functional recall of upcoming events. Emailed home task for patient to generate list of questions to ask during her visit to [facility].      Assessment / Recommendations / Plan  Plan Continue with current plan of care      Progression Toward Goals   Progression toward goals Progressing toward goals              SLP Education - 12/16/21 1414     Education Details Preplanning for conversations    Person(s) Educated Patient    Methods Explanation    Comprehension Verbalized understanding;Need further instruction              SLP Short Term Goals - 11/22/21 1149       SLP SHORT TERM GOAL #8   Title Patient will repeat unintelligible speech with slower rate in at least  3/5 opportunites with min cues (facial expression) in 5-8 minutes conversation.    Time 10    Period --   sessions   Status Revised      SLP SHORT TERM GOAL  #9   TITLE Patient will take active role in conversation by asking at least 3 questions or requesting repetition in 10 minute discussion with min cues.    Baseline 11/01/21: progressing visual structure/written cues required    Time 10    Period --   sessions   Status Revised              SLP Long Term Goals - 11/22/21 1152       SLP LONG TERM GOAL #1   Title Patient will use compensations for aphasia and dysarthria in 10 minutes moderately complex conversation to express thoughts and opinions with listener comprehension at least 90%.    Time 12    Period Weeks    Status On-going    Target Date 01/05/22      SLP LONG TERM GOAL #3   Title Pt will read paragraph-level materials relating to personal interests, financial and medical matters demonstrating comprehension of details >90% accuracy.    Time 12    Period Weeks    Status On-going    Target Date 01/05/22      SLP LONG TERM GOAL #4   Title Pt will demonstrate comprehension of verbal information related to health conditions or personal interests by paraphrasing details at least 80% accuracy.    Time 12    Period Weeks    Status Revised    Target Date 01/05/22      SLP LONG TERM GOAL #5   Title Patient will repeat unintelligible speech with min visual cues in a noisy/distracting environment at least 80% of the time.    Time 12    Period Weeks    Target Date 02/20/22      Additional Long Term Goals   Additional Long Term Goals Yes      SLP LONG TERM GOAL #6   Title Pt will use external aids to manage appointments, chores, shopping, and log vital signs with mod I.    Time 12    Period Weeks    Status New    Target Date 02/20/22              Plan - 12/16/21 1415     Clinical Impression Statement Laura [name] presents with mild anomic aphasia, mild  dysarthria, and mild-moderate cognitive deficits s/p CVA. Pt initiating in conversation more frequently, including bids for information, appropriate topic-shifting, and observations. Improving initiation in ADLs per sister; has begun retrieving some of her own items on shopping trips. Continued training in use of calendar/external aids for managing daily schedule; patient responsive to intervention. Continue skilled ST to  address aphasia, dysarthria, and cognitive-communication deficits in order to improve ability to communicate wants and needs and increase pt independence, as pt goal is to return to living independently.    Speech Therapy Frequency 2x / week    Duration 12 weeks    Treatment/Interventions Language facilitation;Environmental controls;Cueing hierarchy;SLP instruction and feedback;Compensatory techniques;Cognitive reorganization;Functional tasks;Compensatory strategies;Internal/external aids;Multimodal communcation approach;Patient/family education    Potential to Achieve Goals Good    Consulted and Agree with Plan of Care Patient;Family member/caregiver             Patient will benefit from skilled therapeutic intervention in order to improve the following deficits and impairments:   Cognitive communication deficit  Aphasia  Dysarthria and anarthria    Problem List 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) 06/02/2021   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 (CVA) 06/02/2021   Vitamin D deficiency 06/02/2021   Type II diabetes mellitus with complication (HCC)    Vitamin B12 deficiency    Essential hypertension 05/23/2021   Eyesight diminished 12/05/2020   Diminished hearing 12/05/2020   Rondel BatonMary Beth Vick Filter,  MS, CCC-SLP Speech-Language Pathologist   Arlana LindauMary E Tangelia Sanson, CCC-SLP 12/16/2021, 2:52 PM  [place] [facility] [address] [place], [place], [address] Phone: [phone]   Fax:  [phone]   Name: Elliot GurneyWanda [name] MRN: [id number] Date of Birth: [birth year]

## 2021-12-18 NOTE — Progress Notes (Signed)
Subjective:    Patient ID: Laura [name], female    DOB: [birth year], 71 y.o.   MRN: [id number]  HPI  Laura [name] is a 71 year old woman who presents with lower extremity weakness secondary to CVA  -she continues to work with PT and OT, she is doing really well -SLP is her hardest area.  Dinner is 6-7 -she eats three meals per day -she is losing weight -she is not eating enough vegetables.  -she takes insulin 19U -she likes fruits -she likes cantaloupe, honey due, pineapple, peaches.  -she eats a lot of frozen meals at night -she leans to the pastas -she is taking the metformin 850mg  BID.  -she has had cataract sugery and asks if she can drive.  -her sister brings in her log of CBGs.  She makes the lighter meal during the day- she like to make a frozen dinner or wrap.  -her sister accompanies her today.  -she does fine on level surfaces.  -she has all refills from PCP -glucose levels a little high and metformin was added.  -nightime glucose was 200,fasting 150-160. Now fasting is around 100 and evenings more in 130-140 -had some diarrhea.  -she keeps a chart with her BP and CBGs every day.  -denies pain -[place] bacon, fruit, eggs for breakfast.    Pain Inventory Average Pain 0 Pain Right Now 0 My pain is  No pain her for stoke follow up  LOCATION OF PAIN  No pain her for stroke follow up  BOWEL Number of stools per week: 4/ incontinent   BLADDER Pads  Bladder incontinence Yes / pads    Mobility how many minutes can you walk? 30 minutes ability to climb steps?  yes do you drive?  no Do you have any goals in this area?  no  Function retired  Neuro/Psych No problems in this area  Prior Studies Any changes since last visit?  no  Physicians involved in your care Any changes since last visit?  no   Family History  Problem Relation Age of Onset   Hypertension Mother    Diabetes Mother    Arthritis Mother    Heart disease Mother    Heart failure  Father    Stroke Father    COPD Father    Hypertension Sister    Arthritis Sister    Heart disease Maternal Grandfather    Heart disease Maternal Grandmother    Stroke Paternal Grandfather    Stroke Paternal Grandmother    Arthritis Sister    Hypertension Sister    Cancer Maternal Aunt    Diabetes Maternal Aunt    Cancer Paternal Aunt    Hypertension Sister    Varicose Veins Sister    Social History   Socioeconomic History   Marital status: Single    Spouse name: Not on file   Number of children: Not on file   Years of education: Not on file   Highest education level: Not on file  Occupational History   Not on file  Tobacco Use   Smoking status: Never    Passive exposure: Never   Smokeless tobacco: Never  Vaping Use   Vaping Use: Never used  Substance and Sexual Activity   Alcohol use: Never   Drug use: Never   Sexual activity: Not Currently    Birth control/protection: None  Other Topics Concern   Not on file  Social History Narrative   Not on file   Social Determinants of Health  Financial Resource Strain: Low Risk    Difficulty of Paying Living Expenses: Not hard at all  Food Insecurity: No Food Insecurity   Worried About Programme researcher, broadcasting/film/video in the Last Year: Never true   Ran Out of Food in the Last Year: Never true  Transportation Needs: No Transportation Needs   Lack of Transportation (Medical): No   Lack of Transportation (Non-Medical): No  Physical Activity: Insufficiently Active   Days of Exercise per Week: 4 days   Minutes of Exercise per Session: 30 min  Stress: No Stress Concern Present   Feeling of Stress : Not at all  Social Connections: Socially Isolated   Frequency of Communication with Friends and Family: Never   Frequency of Social Gatherings with Friends and Family: Never   Attends Religious Services: Never   Database administrator or Organizations: No   Attends Engineer, structural: Never   Marital Status: Never married    Past Surgical History:  Procedure Laterality Date   CATARACT EXTRACTION W/PHACO Right 11/10/2021   Procedure: CATARACT EXTRACTION PHACO AND INTRAOCULAR LENS PLACEMENT (IOC) RIGHT DIABETIC  22.44 02:07.2;  Surgeon: Lockie Mola, MD;  Location: [facility];  Service: Ophthalmology;  Laterality: Right;   CATARACT EXTRACTION W/PHACO Left 12/01/2021   Procedure: CATARACT EXTRACTION PHACO AND INTRAOCULAR LENS PLACEMENT (IOC) LEFT DIABETIC 3.10 00:41.8;  Surgeon: Lockie Mola, MD;  Location: [facility];  Service: Ophthalmology;  Laterality: Left;   FOOT SURGERY     FRACTURE SURGERY  ?   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  Opioid Risk Score:   Fall Risk Score:  `1  Depression screen PHQ 2/9  Depression screen [facility] 2/9 10/27/2021 09/09/2021 09/09/2021 08/11/2021 07/26/2021 06/02/2021  Decreased Interest 0 0 0 0 0 3  Down, Depressed, Hopeless 0 0 - 0 0 3  PHQ - 2 Score 0 0 0 0 0 6  Altered sleeping 0 0 - 0 0 3  Tired, decreased energy 0 3 - 0 0 3  Change in appetite 0 0 - 0 0 2  Feeling bad or failure about yourself  0 0 - 0 0 0  Trouble concentrating 0 0 - 0 0 0  Moving slowly or fidgety/restless 0 0 - 0 0 3  Suicidal thoughts 0 0 - 0 0 0  PHQ-9 Score 0 3 - 0 0 17  Difficult doing work/chores Not difficult at all - - - - Extremely dIfficult    Review of Systems  Constitutional: Negative.   HENT: Negative.    Eyes: Negative.   Respiratory: Negative.    Cardiovascular: Negative.   Gastrointestinal:        Incontinent  Endocrine: Negative.   Genitourinary:         Incontinence   Musculoskeletal: Negative.   Skin: Negative.   Allergic/Immunologic: Negative.   Neurological:  Positive for seizures.  Hematological: Negative.   Psychiatric/Behavioral: Negative.    All other systems reviewed and are negative.     Objective:   Physical Exam Gen: no distress, normal appearing HEENT: oral mucosa pink and moist, NCAT Cardio:  Reg rate Chest: normal effort, normal rate of breathing Abd: soft, non-distended Ext: no edema Psych: pleasant, normal affect Skin: intact Neuro: Alert and oriented x3. Able to answer questions more accurately.  Musculoskeletal: 5/5 strength throughout    Assessment & Plan:   1) CVA -continue SLP -discussed driving school -discussed application to retirement community in [place].  -would benefit from handicap placard to increase her mobility in the community -provided dietary and exercise counseling -discussed that hypertension is number one reversible risk factor for stroke.    2) Diabetes: Increase metformin to 1000mg  BID.  -discussed diet extensivey -decrease Insulin by 5 more units.  -check CBGs daily, log, and bring log to follow-up appointment -avoid sugar, bread, pasta, rice -avoid snacking -perform daily foot exam and at least annual eye exam -try to incorporate into your diet some of the following foods which are good for diabetes: 1) cinnamon- imitates effects of insulin, increasing glucose transport into cells ( or [place] cinnamon is best, least processed) 2) nuts- can slow down the blood sugar response of carbohydrate rich foods 3) oatmeal- contains and anti-inflammatory compound avenanthramide 4) whole-milk yogurt (best types are no sugar, [place] yogurt, or goat/sheep yogurt) 5) beans- high in protein, fiber, and vitamins, low glycemic index 6) broccoli- great source of vitamin A and C 7) quinoa- higher in protein and fiber than other grains 8) spinach- high in vitamin A, fiber, and  protein 9) olive oil- reduces glucose levels, LDL, and triglycerides 10) salmon- excellent amount of omega-3-fatty acids 11) walnuts- rich in antioxidants 12) apples- high in fiber and quercetin 13) carrots- highly nutritious with low impact on blood sugar 14) eggs- improve HDL (good cholesterol), high in protein, keep you satiated 15) turmeric: improves blood sugars, cardiovascular disease, and protects kidney health 16) garlic: improves blood sugar, blood pressure, pain 17) tomatoes: highly nutritious with low impact on blood sugar

## 2021-12-19 ENCOUNTER — Encounter: Payer: Self-pay | Admitting: Internal Medicine

## 2021-12-20 ENCOUNTER — Encounter: Payer: Self-pay | Admitting: Physical Medicine and Rehabilitation

## 2021-12-20 ENCOUNTER — Other Ambulatory Visit: Payer: Self-pay

## 2021-12-20 ENCOUNTER — Encounter
Payer: Medicare Other | Attending: Physical Medicine and Rehabilitation | Admitting: Physical Medicine and Rehabilitation

## 2021-12-20 VITALS — BP 120/70 | HR 101 | Temp 97.7°F | Wt 133.4 lb

## 2021-12-20 DIAGNOSIS — E118 Type 2 diabetes mellitus with unspecified complications: Secondary | ICD-10-CM | POA: Diagnosis present

## 2021-12-20 DIAGNOSIS — I639 Cerebral infarction, unspecified: Secondary | ICD-10-CM

## 2021-12-20 MED ORDER — METFORMIN HCL 1000 MG PO TABS: 1000.0000 mg | ORAL_TABLET | Freq: Two times a day (BID) | ORAL | 3 refills | Status: DC

## 2021-12-20 MED ORDER — CYANOCOBALAMIN 1000 MCG PO TABS: 1000.0000 ug | ORAL_TABLET | Freq: Every day | ORAL | 1 refills | Status: AC

## 2021-12-21 ENCOUNTER — Ambulatory Visit: Payer: Medicare Other | Admitting: Speech Pathology

## 2021-12-21 ENCOUNTER — Encounter: Payer: Medicare Other | Admitting: Occupational Therapy

## 2021-12-21 ENCOUNTER — Ambulatory Visit: Payer: Medicare Other | Admitting: Physical Therapy

## 2021-12-21 DIAGNOSIS — R41841 Cognitive communication deficit: Secondary | ICD-10-CM | POA: Diagnosis not present

## 2021-12-21 DIAGNOSIS — R4701 Aphasia: Secondary | ICD-10-CM

## 2021-12-21 DIAGNOSIS — R471 Dysarthria and anarthria: Secondary | ICD-10-CM

## 2021-12-21 NOTE — Therapy (Signed)
[place] MAIN [facility] SERVICES [address] [place], [place], [address] Phone: [phone]   Fax:  [phone]  Speech Language Pathology Treatment and Progress Note  Patient Details  Name: Laura [name] MRN: [id number] Date of Birth: 11/09/1951 Referring Provider (SLP): Lauraine Rinne, PA-C Speech Therapy Progress Note  Dates of Reporting Period: 11/01/2021 to 12/21/2021  Objective: Patient has been seen for 10 speech therapy sessions this reporting period targeting cognitive communication deficits, aphasia and dysarthria. Patient is making progress toward LTGs and met 2/2 STGs this reporting period. See skilled intervention, clinical impressions, and goals below for details.   Encounter Date: 12/21/2021   End of Session - 12/21/21 1749     Visit Number 40    Number of Visits 22    Date for SLP Re-Evaluation 01/05/22    Authorization Type MCARE/[employer]    Authorization - Visit Number 10    Progress Note Due on Visit 10    SLP Start Time 70    SLP Stop Time  1400    SLP Time Calculation (min) 60 min    Activity Tolerance Patient tolerated treatment well             Past Medical History:  Diagnosis Date   Acute CVA (cerebrovascular accident) ([place]) 05/24/2021   Diabetes mellitus without complication ([place]) 17/79/3903   Hyperlipidemia 05/23/2021   Hypertension 05/23/2021   Left forearm fracture    in high school--was casted   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   Stroke ([place]) 05/23/2021    Past Surgical History:  Procedure Laterality Date   CATARACT EXTRACTION W/PHACO Right 11/10/2021   Procedure: CATARACT EXTRACTION PHACO AND INTRAOCULAR LENS PLACEMENT ([place]) RIGHT DIABETIC  22.44 02:07.2;  Surgeon: Leandrew Koyanagi, MD;  Location: [place];  Service: Ophthalmology;  Laterality: Right;   CATARACT EXTRACTION W/PHACO Left 12/01/2021   Procedure: CATARACT EXTRACTION PHACO AND INTRAOCULAR LENS PLACEMENT ([facility]) LEFT  DIABETIC 3.10 00:41.8;  Surgeon: Leandrew Koyanagi, MD;  Location: [place];  Service: Ophthalmology;  Laterality: Left;   FOOT SURGERY     FRACTURE SURGERY  ?   Foot fracture, no surgery   TONSILLECTOMY      There were no vitals filed for this visit.   Subjective Assessment - 12/21/21 1746     Subjective [place] over the weekend    Patient is accompained by: Family member   sister Laura [name]   Currently in Pain? No/denies                   ADULT SLP TREATMENT - 12/21/21 1755       General Information   Behavior/Cognition Alert;Cooperative;Pleasant mood    HPI Laura [name] is a 71 year old female with past medical hx noted for DM II, HLD,   admitted at [facility] on 05/23/21 with garbled speech, right facial droop, vision changes and  RUE weakness. MRI brain showed acute infarct in left caudate head and anterior lentiform nucleus, chronic microvascular ischemia. Resulting transcortical motor aphasia, dysarthria and cognitive deficits. Blood sugar was 372 on admission. Admitted to CIR 6/22- 06/02/21.      Cognitive-Linquistic Treatment   Treatment focused on Aphasia;Cognition    Skilled Treatment Pt completed home task, wrote down questions she asked on her visit to [facility]. Facilitated mod complex conversation re: [facility] as well as her visit with Dr. Ranell Patrick. Pt required occasional cues from SLP and sister for recalling/verbalizing specific details. Pt repeated unintelligible speech with nonverbal cue  on 2/3 opportunities (66%), with verbal cue required x1. Attempted to elicit functional goals, including potential use of external aids to support pt in planning/tracking foods. Pt began generating list of potential snacks/meals of interest using MD's handout of foods to incorporate.      Assessment / Recommendations / Plan   Plan Continue with current plan of care      Progression Toward Goals   Progression toward goals Progressing toward goals               SLP Education - 12/21/21 1822     Education Details External aids to assist with preplanning may increase independence    Person(s) Educated Patient;Other (comment)   sister   Methods Explanation    Comprehension Verbalized understanding              SLP Short Term Goals - 12/21/21 1750       SLP SHORT TERM GOAL #8   Title Patient will repeat unintelligible speech with slower rate in at least 3/5 opportunites with min cues (facial expression) in 5-8 minutes conversation.    Time 10    Period --   sessions   Status Achieved      SLP SHORT TERM GOAL  #9   TITLE Patient will take active role in conversation by asking at least 3 questions or requesting repetition in 10 minute discussion with min cues.    Baseline 11/01/21: progressing visual structure/written cues required    Time 10    Period --   sessions   Status Achieved              SLP Long Term Goals - 12/21/21 1751       SLP LONG TERM GOAL #1   Title Patient will use compensations for aphasia and dysarthria in 10 minutes moderately complex conversation to express thoughts and opinions with listener comprehension at least 90%.    Time 12    Period Weeks    Status On-going    Target Date 01/05/22      SLP LONG TERM GOAL #3   Title Pt will read paragraph-level materials relating to personal interests, financial and medical matters demonstrating comprehension of details >90% accuracy.    Time 12    Period Weeks    Status On-going    Target Date 01/05/22      SLP LONG TERM GOAL #4   Title Pt will demonstrate comprehension of verbal information related to health conditions or personal interests by paraphrasing details at least 80% accuracy.    Time 12    Period Weeks    Status On-going    Target Date 01/05/22      SLP LONG TERM GOAL #5   Title Patient will repeat unintelligible speech with min visual cues in a noisy/distracting environment at least 80% of the time.    Time 12    Period Weeks     Status On-going    Target Date 02/20/22      SLP LONG TERM GOAL #6   Title Pt will use external aids to manage appointments, chores, shopping, and log vital signs with mod I.    Time 12    Period Weeks    Status On-going    Target Date 02/20/22              Plan - 12/21/21 1823     Clinical Impression Statement Joyceline Maiorino presents with mild anomic aphasia, mild dysarthria, and mild-moderate cognitive deficits s/p CVA.  Pt initiating in conversation more frequently, including bids for information, appropriate topic-shifting, and observations. Improving initiation in ADLs per sister; has begun retrieving some of her own items on shopping trips. Pt initiating repeats of unintelligible speech with reduced need for cuing. Met 2/2 STG this reporting period. Continue skilled ST to address aphasia, dysarthria, and cognitive-communication deficits in order to improve ability to communicate wants and needs and increase pt independence, as pt goal is to return to living independently.    Speech Therapy Frequency 2x / week    Duration 12 weeks    Treatment/Interventions Language facilitation;Environmental controls;Cueing hierarchy;SLP instruction and feedback;Compensatory techniques;Cognitive reorganization;Functional tasks;Compensatory strategies;Internal/external aids;Multimodal communcation approach;Patient/family education    Potential to Achieve Goals Good    Consulted and Agree with Plan of Care Patient;Family member/caregiver             Patient will benefit from skilled therapeutic intervention in order to improve the following deficits and impairments:   Cognitive communication deficit  Aphasia  Dysarthria and anarthria    Problem List Patient Active Problem List   Diagnosis Date Noted   Cerebrovascular accident (CVA) ([place]) 09/06/2021   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/06/2021   Hyperlipidemia associated with type 2 diabetes mellitus ([place]) 06/02/2021    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 [place], [place], [address] Phone: [phone]   Fax:  [phone]   Name: Abena Erdman MRN: [id number] Date of Birth: June 27, 1951

## 2021-12-23 ENCOUNTER — Ambulatory Visit: Payer: Medicare Other | Admitting: Speech Pathology

## 2021-12-23 ENCOUNTER — Other Ambulatory Visit: Payer: Self-pay

## 2021-12-23 ENCOUNTER — Ambulatory Visit: Payer: Medicare Other | Admitting: Physical Therapy

## 2021-12-23 ENCOUNTER — Encounter: Payer: Medicare Other | Admitting: Occupational Therapy

## 2021-12-23 DIAGNOSIS — R41841 Cognitive communication deficit: Secondary | ICD-10-CM | POA: Diagnosis not present

## 2021-12-23 DIAGNOSIS — R471 Dysarthria and anarthria: Secondary | ICD-10-CM

## 2021-12-23 DIAGNOSIS — R4701 Aphasia: Secondary | ICD-10-CM

## 2021-12-23 NOTE — Therapy (Signed)
[place] MAIN [facility] SERVICES [address] [place], [place], [address] Phone: [phone]   Fax:  [phone]  Speech Language Pathology Treatment  Patient Details  Name: Laura [name] MRN: [id number] Date of Birth: 05/14/1951 Referring Provider (SLP): Lauraine Rinne, PA-C   Encounter Date: 12/23/2021   End of Session - 12/23/21 1408     Visit Number 41    Number of Visits 20    Date for SLP Re-Evaluation 01/05/22    Authorization Type MCARE/[employer]IG[employer]    Authorization - Visit Number 1    Progress Note Due on Visit 10    SLP Start Time 38    SLP Stop Time  1400    SLP Time Calculation (min) 60 min    Activity Tolerance Patient tolerated treatment well             Past Medical History:  Diagnosis Date   Acute CVA (cerebrovascular accident) ([place]) 05/24/2021   Diabetes mellitus without complication ([place]) 0000000   Hyperlipidemia 05/23/2021   Hypertension 05/23/2021   Left forearm fracture    in high school--was casted   Stroke (cerebrum) ([place]) 05/26/2021   Stroke ([place]) 05/23/2021    Past Surgical History:  Procedure Laterality Date   CATARACT EXTRACTION W/PHACO Right 11/10/2021   Procedure: CATARACT EXTRACTION PHACO AND INTRAOCULAR LENS PLACEMENT ([place]) RIGHT DIABETIC  22.44 02:07.2;  Surgeon: Leandrew Koyanagi, MD;  Location: [place];  Service: Ophthalmology;  Laterality: Right;   CATARACT EXTRACTION W/PHACO Left 12/01/2021   Procedure: CATARACT EXTRACTION PHACO AND INTRAOCULAR LENS PLACEMENT ([facility]) LEFT DIABETIC 3.10 00:41.8;  Surgeon: Leandrew Koyanagi, MD;  Location: [place];  Service: Ophthalmology;  Laterality: Left;   FOOT SURGERY     FRACTURE SURGERY  ?   Foot fracture, no surgery   TONSILLECTOMY      There were no vitals filed for this visit.   Subjective Assessment - 12/23/21 1407     Subjective Did not get homework email    Currently in Pain? No/denies                    ADULT SLP TREATMENT - 12/23/21 1407       General Information   Behavior/Cognition Alert;Cooperative;Pleasant mood    HPI Laura [name] is a 71 year old female with past medical hx noted for DM II, HLD,   admitted at [facility] on 05/23/21 with garbled speech, right facial droop, vision changes and  RUE weakness. MRI brain showed acute infarct in left caudate head and anterior lentiform nucleus, chronic microvascular ischemia. Resulting transcortical motor aphasia, dysarthria and cognitive deficits. Blood sugar was 372 on admission. Admitted to CIR 6/22- 06/02/21.      Cognitive-Linquistic Treatment   Treatment focused on Aphasia;Cognition    Skilled Treatment Targeted auditory comprehension of details by having pt follow verbal instructions to complete search using her phone for healthy recipes. Reading comprehension of paragraph level instructions 90% with occasional mod cues for re-reading; pt summarized details with min question cues.      Assessment / Recommendations / Plan   Plan Continue with current plan of care      Progression Toward Goals   Progression toward goals Progressing toward goals              SLP Education - 12/23/21 1407     Education Details browse/print recipes    Person(s) Educated Patient;Other (comment)   sister   Methods Explanation    Comprehension Verbalized understanding  SLP Short Term Goals - 12/21/21 1750       SLP SHORT TERM GOAL #8   Title Patient will repeat unintelligible speech with slower rate in at least 3/5 opportunites with min cues (facial expression) in 5-8 minutes conversation.    Time 10    Period --   sessions   Status Achieved      SLP SHORT TERM GOAL  #9   TITLE Patient will take active role in conversation by asking at least 3 questions or requesting repetition in 10 minute discussion with min cues.    Baseline 11/01/21: progressing visual structure/written cues required    Time 10    Period --   sessions    Status Achieved              SLP Long Term Goals - 12/21/21 1751       SLP LONG TERM GOAL #1   Title Patient will use compensations for aphasia and dysarthria in 10 minutes moderately complex conversation to express thoughts and opinions with listener comprehension at least 90%.    Time 12    Period Weeks    Status On-going    Target Date 01/05/22      SLP LONG TERM GOAL #3   Title Pt will read paragraph-level materials relating to personal interests, financial and medical matters demonstrating comprehension of details >90% accuracy.    Time 12    Period Weeks    Status On-going    Target Date 01/05/22      SLP LONG TERM GOAL #4   Title Pt will demonstrate comprehension of verbal information related to health conditions or personal interests by paraphrasing details at least 80% accuracy.    Time 12    Period Weeks    Status On-going    Target Date 01/05/22      SLP LONG TERM GOAL #5   Title Patient will repeat unintelligible speech with min visual cues in a noisy/distracting environment at least 80% of the time.    Time 12    Period Weeks    Status On-going    Target Date 02/20/22      SLP LONG TERM GOAL #6   Title Pt will use external aids to manage appointments, chores, shopping, and log vital signs with mod I.    Time 12    Period Weeks    Status On-going    Target Date 02/20/22              Plan - 12/23/21 1408     Clinical Impression Statement Laura [name] presents with mild anomic aphasia, mild dysarthria, and mild-moderate cognitive deficits s/p CVA. Pt initiating in conversation more frequently, including bids for information, appropriate topic-shifting, and observations. Improving initiation in ADLs per sister; has begun retrieving some of her own items on shopping trips. Pt initiating repeats of unintelligible speech with reduced need for cuing. Pt demonstrating comprehension of recipe instructions today with min-mod cues. Continue skilled ST to address  aphasia, dysarthria, and cognitive-communication deficits in order to improve ability to communicate wants and needs and increase pt independence, as pt goal is to return to living independently.    Speech Therapy Frequency 2x / week    Duration 12 weeks    Treatment/Interventions Language facilitation;Environmental controls;Cueing hierarchy;SLP instruction and feedback;Compensatory techniques;Cognitive reorganization;Functional tasks;Compensatory strategies;Internal/external aids;Multimodal communcation approach;Patient/family education    Potential to Achieve Goals Good    Consulted and Agree with Plan of Care Patient;Family member/caregiver  Patient will benefit from skilled therapeutic intervention in order to improve the following deficits and impairments:   Cognitive communication deficit  Aphasia  Dysarthria and anarthria    Problem List Patient Active Problem List   Diagnosis Date Noted   Cerebrovascular accident (CVA) ([place]) 09/06/2021   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/06/2021   Hyperlipidemia associated with type 2 diabetes mellitus ([place]) 06/02/2021   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 [place], [place], [address] Phone: [phone]   Fax:  [phone]   Name: Laura [name] MRN: [id number] Date of Birth: [birth year]

## 2021-12-28 ENCOUNTER — Ambulatory Visit: Payer: Medicare Other | Admitting: Speech Pathology

## 2021-12-28 ENCOUNTER — Ambulatory Visit: Payer: Medicare Other | Admitting: Physical Therapy

## 2021-12-28 ENCOUNTER — Encounter: Payer: Medicare Other | Admitting: Occupational Therapy

## 2021-12-28 ENCOUNTER — Other Ambulatory Visit: Payer: Self-pay

## 2021-12-28 DIAGNOSIS — R41841 Cognitive communication deficit: Secondary | ICD-10-CM

## 2021-12-28 DIAGNOSIS — R4701 Aphasia: Secondary | ICD-10-CM

## 2021-12-28 DIAGNOSIS — R471 Dysarthria and anarthria: Secondary | ICD-10-CM

## 2021-12-28 NOTE — Therapy (Signed)
[place] [facility] MAIN [facility] SERVICES [address] [place], [place], [address] Phone: [phone]   Fax:  [phone]  Speech Language Pathology Treatment  Patient Details  Name: Laura [name] MRN: [id number] Date of Birth: August 11, 1951 Referring Provider (SLP): Mariam Dollar, PA-C   Encounter Date: 12/28/2021   End of Session - 12/28/21 1540     Visit Number 42    Number of Visits 47    Date for SLP Re-Evaluation 01/05/22    Authorization Type MCARE/CIGNA    Authorization - Visit Number 2    Progress Note Due on Visit 10    SLP Start Time 1300    SLP Stop Time  1400    SLP Time Calculation (min) 60 min    Activity Tolerance Patient tolerated treatment well             Past Medical History:  Diagnosis Date   Acute CVA (cerebrovascular accident) ([facility]) 05/24/2021   Diabetes mellitus without complication ([facility]) 05/23/2021   Hyperlipidemia 05/23/2021   Hypertension 05/23/2021   Left forearm fracture    in high school--was casted   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   Stroke ([facility]) 05/23/2021    Past Surgical History:  Procedure Laterality Date   CATARACT EXTRACTION W/PHACO Right 11/10/2021   Procedure: CATARACT EXTRACTION PHACO AND INTRAOCULAR LENS PLACEMENT ([facility]) RIGHT DIABETIC  22.44 02:07.2;  Surgeon: Lockie Mola, MD;  Location: [facility];  Service: Ophthalmology;  Laterality: Right;   CATARACT EXTRACTION W/PHACO Left 12/01/2021   Procedure: CATARACT EXTRACTION PHACO AND INTRAOCULAR LENS PLACEMENT ([facility]) LEFT DIABETIC 3.10 00:41.8;  Surgeon: Lockie Mola, MD;  [facility];  Service: Ophthalmology;  Laterality: Left;   FOOT SURGERY     FRACTURE SURGERY  ?   Foot fracture, no surgery   TONSILLECTOMY      There were no vitals filed for this visit.   Subjective Assessment - 12/28/21 1530     Subjective Pt laughing with sister on way back to ST room    Currently in Pain? No/denies                    ADULT SLP TREATMENT - 12/28/21 1531       General Information   Behavior/Cognition Alert;Cooperative;Pleasant mood    HPI Laura [name] is a 71 year old female with past medical hx noted for DM II, HLD,   admitted at [facility] on 05/23/21 with garbled speech, right facial droop, vision changes and  RUE weakness. MRI brain showed acute infarct in left caudate head and anterior lentiform nucleus, chronic microvascular ischemia. Resulting transcortical motor aphasia, dysarthria and cognitive deficits. Blood sugar was 372 on admission. Admitted to CIR 6/22- 06/02/21.      Cognitive-Linquistic Treatment   Treatment focused on Aphasia;Cognition    Skilled Treatment Facilitated conversation with pt re: plan of care. Pt would like to decrease frequency to 1x per week beginning in February. We discussed potential new LTGs focused on aural rehabilitation (hearing aids Feb. 8) and placing more focus on conversation vs reading/writing. Pt agreed that she has difficulty answering questions "on the spot." Education provided on strategies/ phrases pt can use to reduce time pressure. Facilitated conversation re: topic of interest (video games). Pt benefitted from use of visual aids (pictures), extended time, and occasional question cues when hesitations/anomia occurred.      Assessment / Recommendations / Plan   Plan Continue with current plan of care      Progression Toward  Goals   Progression toward goals Progressing toward goals              SLP Education - 12/28/21 1539     Education Details functional tasks that target attention/cognition (finding/modifying/following recipe)    Person(s) Educated Patient;Other (comment)   sister   Methods Explanation    Comprehension Verbalized understanding              SLP Short Term Goals - 12/21/21 1750       SLP SHORT TERM GOAL #8   Title Patient will repeat unintelligible speech with slower rate in at least 3/5 opportunites with min  cues (facial expression) in 5-8 minutes conversation.    Time 10    Period --   sessions   Status Achieved      SLP SHORT TERM GOAL  #9   TITLE Patient will take active role in conversation by asking at least 3 questions or requesting repetition in 10 minute discussion with min cues.    Baseline 11/01/21: progressing visual structure/written cues required    Time 10    Period --   sessions   Status Achieved              SLP Long Term Goals - 12/21/21 1751       SLP LONG TERM GOAL #1   Title Patient will use compensations for aphasia and dysarthria in 10 minutes moderately complex conversation to express thoughts and opinions with listener comprehension at least 90%.    Time 12    Period Weeks    Status On-going    Target Date 01/05/22      SLP LONG TERM GOAL #3   Title Pt will read paragraph-level materials relating to personal interests, financial and medical matters demonstrating comprehension of details >90% accuracy.    Time 12    Period Weeks    Status On-going    Target Date 01/05/22      SLP LONG TERM GOAL #4   Title Pt will demonstrate comprehension of verbal information related to health conditions or personal interests by paraphrasing details at least 80% accuracy.    Time 12    Period Weeks    Status On-going    Target Date 01/05/22      SLP LONG TERM GOAL #5   Title Patient will repeat unintelligible speech with min visual cues in a noisy/distracting environment at least 80% of the time.    Time 12    Period Weeks    Status On-going    Target Date 02/20/22      SLP LONG TERM GOAL #6   Title Pt will use external aids to manage appointments, chores, shopping, and log vital signs with mod I.    Time 12    Period Weeks    Status On-going    Target Date 02/20/22              Plan - 12/28/21 1541     Clinical Impression Statement Elliot GurneyWanda Bancroft presents with mild anomic aphasia, mild dysarthria, and mild-moderate cognitive deficits s/p CVA. Pt  initiating in conversation more frequently, including bids for information, appropriate topic-shifting, and observations. Improving initiation in ADLs per sister; has begun retrieving some of her own items on shopping trips. Pt initiating repeats of unintelligible speech with reduced need for cuing. Continues to report difficulty with organizing thoughts in conversation, responding to questions, and communicating in noisier environments. Continue skilled ST to address aphasia, dysarthria, and cognitive-communication deficits in  order to improve ability to communicate wants and needs and increase pt independence, as pt goal is to return to living independently.    Speech Therapy Frequency 2x / week    Duration 12 weeks    Treatment/Interventions Language facilitation;Environmental controls;Cueing hierarchy;SLP instruction and feedback;Compensatory techniques;Cognitive reorganization;Functional tasks;Compensatory strategies;Internal/external aids;Multimodal communcation approach;Patient/family education    Potential to Achieve Goals Good    Consulted and Agree with Plan of Care Patient;Family member/caregiver             Patient will benefit from skilled therapeutic intervention in order to improve the following deficits and impairments:   Aphasia  Dysarthria and anarthria  Cognitive communication deficit    Problem List 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) 06/02/2021   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 (CVA) 06/02/2021   Vitamin D deficiency 06/02/2021   Type II diabetes mellitus with complication ([facility])    Vitamin B12 deficiency    Essential hypertension 05/23/2021   Eyesight diminished 12/05/2020   Diminished  hearing 12/05/2020   Rondel Baton, MS, CCC-SLP Speech-Language Pathologist  Arlana Lindau, CCC-SLP 12/28/2021, 3:42 PM  [place] [facility] MAIN [facility] SERVICES [address] [place], [place], [address] Phone: [phone]   Fax:  [phone]   Name: Zera Markwardt MRN: [id number] Date of Birth: [birth year]

## 2021-12-30 ENCOUNTER — Ambulatory Visit: Payer: Medicare Other | Admitting: Speech Pathology

## 2021-12-30 ENCOUNTER — Ambulatory Visit: Payer: Medicare Other | Admitting: Physical Therapy

## 2021-12-30 ENCOUNTER — Other Ambulatory Visit: Payer: Self-pay

## 2021-12-30 ENCOUNTER — Encounter: Payer: Medicare Other | Admitting: Occupational Therapy

## 2021-12-30 DIAGNOSIS — R41841 Cognitive communication deficit: Secondary | ICD-10-CM | POA: Diagnosis not present

## 2021-12-30 DIAGNOSIS — R471 Dysarthria and anarthria: Secondary | ICD-10-CM

## 2021-12-30 DIAGNOSIS — R4701 Aphasia: Secondary | ICD-10-CM

## 2021-12-30 NOTE — Therapy (Signed)
[facility] MAIN [facility] SERVICES [address] [place], [place], [address] Phone: [phone]   Fax:  [phone]  Speech Language Pathology Treatment  Patient Details  Name: Laura [name] MRN: [id number] Date of Birth: December 26, 1950 Referring Provider (SLP): Mariam Dollar, PA-C   Encounter Date: 12/30/2021   End of Session - 12/30/21 1526     Visit Number 43    Number of Visits 47    Date for SLP Re-Evaluation 01/05/22    Authorization Type [id number]    Authorization - Visit Number 3    Progress Note Due on Visit 10    SLP Start Time 1300    SLP Stop Time  1404    SLP Time Calculation (min) 64 min    Activity Tolerance Patient tolerated treatment well             Past Medical History:  Diagnosis Date   Acute CVA (cerebrovascular accident) (HCC) 05/24/2021   Diabetes mellitus without complication (HCC) 05/23/2021   Hyperlipidemia 05/23/2021   Hypertension 05/23/2021   Left forearm fracture    in high school--was casted   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   Stroke (HCC) 05/23/2021    Past Surgical History:  Procedure Laterality Date   CATARACT EXTRACTION W/PHACO Right 11/10/2021   Procedure: CATARACT EXTRACTION PHACO AND INTRAOCULAR LENS PLACEMENT (IOC) RIGHT DIABETIC  22.44 02:07.2;  Surgeon: Lockie Mola, MD;  Location: [facility];  Service: Ophthalmology;  Laterality: Right;   CATARACT EXTRACTION W/PHACO Left 12/01/2021   Procedure: CATARACT EXTRACTION PHACO AND INTRAOCULAR LENS PLACEMENT (IOC) LEFT DIABETIC 3.10 00:41.8;  Surgeon: Lockie Mola, MD;  Location: [facility];  Service: Ophthalmology;  Laterality: Left;   FOOT SURGERY     FRACTURE SURGERY  ?   Foot fracture, no surgery   TONSILLECTOMY      There were no vitals filed for this visit.   Subjective Assessment - 12/30/21 1525     Subjective Pt conversed with SLP on way back to ST room    Currently in Pain? No/denies                    ADULT SLP TREATMENT - 12/30/21 1525       General Information   Behavior/Cognition Alert;Cooperative;Pleasant mood    HPI Laura [name] is a 71 year old female with past medical hx noted for DM II, HLD,   admitted at [facility] on 05/23/21 with garbled speech, right facial droop, vision changes and  RUE weakness. MRI brain showed acute infarct in left caudate head and anterior lentiform nucleus, chronic microvascular ischemia. Resulting transcortical motor aphasia, dysarthria and cognitive deficits. Blood sugar was 372 on admission. Admitted to CIR 6/22- 06/02/21.      Cognitive-Linquistic Treatment   Treatment focused on Aphasia;Cognition    Skilled Treatment Intelligibility in quiet environment today 95%, with 1 request for repeat necessary. Focused session on anomia compensations in mod complex conversation. Pt responding within appropriate amount of time re: questions on familiar topics (family stories, cars, etc). Used structured task to increase complexity, with pt re-reading and taking time for pre-planning prior to responding. Pt required rephrasing of the question x1, also benefitted from redirection to answer [facility] questions, circumlocution when "freezing up". Education provided on requesting more time if necessary, and how family can encourage her to ask for this at home.      Assessment / Recommendations / Plan   Plan Continue with current plan of care  Progression Toward Goals   Progression toward goals Progressing toward goals              SLP Education - 12/30/21 1526     Person(s) Educated Patient;Other (comment)   sister   Methods Explanation    Comprehension Verbalized understanding              SLP Short Term Goals - 12/21/21 1750       SLP SHORT TERM GOAL #8   Title Patient will repeat unintelligible speech with slower rate in at least 3/5 opportunites with min cues (facial expression) in 5-8 minutes conversation.    Time 10    Period --    sessions   Status Achieved      SLP SHORT TERM GOAL  #9   TITLE Patient will take active role in conversation by asking at least 3 questions or requesting repetition in 10 minute discussion with min cues.    Baseline 11/01/21: progressing visual structure/written cues required    Time 10    Period --   sessions   Status Achieved              SLP Long Term Goals - 12/21/21 1751       SLP LONG TERM GOAL #1   Title Patient will use compensations for aphasia and dysarthria in 10 minutes moderately complex conversation to express thoughts and opinions with listener comprehension at least 90%.    Time 12    Period Weeks    Status On-going    Target Date 01/05/22      SLP LONG TERM GOAL #3   Title Pt will read paragraph-level materials relating to personal interests, financial and medical matters demonstrating comprehension of details >90% accuracy.    Time 12    Period Weeks    Status On-going    Target Date 01/05/22      SLP LONG TERM GOAL #4   Title Pt will demonstrate comprehension of verbal information related to health conditions or personal interests by paraphrasing details at least 80% accuracy.    Time 12    Period Weeks    Status On-going    Target Date 01/05/22      SLP LONG TERM GOAL #5   Title Patient will repeat unintelligible speech with min visual cues in a noisy/distracting environment at least 80% of the time.    Time 12    Period Weeks    Status On-going    Target Date 02/20/22      SLP LONG TERM GOAL #6   Title Pt will use external aids to manage appointments, chores, shopping, and log vital signs with mod I.    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 Pt initiating in conversation more frequently, including bids for information, appropriate  topic-shifting, and observations. Improving initiation in ADLs per sister; has begun retrieving some of her own items on shopping trips. Continues to report difficulty with organizing thoughts in conversation, responding to open-ended/abstract questions, and communicating in noisier environments. Continue skilled ST to address aphasia, dysarthria, and cognitive-communication deficits in order to improve ability to communicate wants and needs and increase pt independence, as pt goal is to return to  living independently.    Speech Therapy Frequency 2x / week    Duration 12 weeks    Treatment/Interventions Language facilitation;Environmental controls;Cueing hierarchy;SLP instruction and feedback;Compensatory techniques;Cognitive reorganization;Functional tasks;Compensatory strategies;Internal/external aids;Multimodal communcation approach;Patient/family education    Potential to Achieve Goals Good    Consulted and Agree with Plan of Care Patient;Family member/caregiver             Patient will benefit from skilled therapeutic intervention in order to improve the following deficits and impairments:   No diagnosis found.    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 [place], [place], [address] Phone: [phone]   Fax:  [phone]   Name: Laura [name] MRN: [id number] Date of Birth: [birth year]

## 2022-01-04 ENCOUNTER — Ambulatory Visit: Payer: Medicare Other | Admitting: Physical Therapy

## 2022-01-04 ENCOUNTER — Encounter: Payer: Medicare Other | Admitting: Occupational Therapy

## 2022-01-04 ENCOUNTER — Other Ambulatory Visit: Payer: Self-pay

## 2022-01-04 ENCOUNTER — Ambulatory Visit: Payer: Medicare Other | Admitting: Speech Pathology

## 2022-01-04 DIAGNOSIS — R471 Dysarthria and anarthria: Secondary | ICD-10-CM

## 2022-01-04 DIAGNOSIS — R41841 Cognitive communication deficit: Secondary | ICD-10-CM | POA: Diagnosis not present

## 2022-01-04 DIAGNOSIS — R4701 Aphasia: Secondary | ICD-10-CM

## 2022-01-04 NOTE — Therapy (Signed)
[place] MAIN [facility] SERVICES [address] [place], [place], [address] Phone: [phone]   Fax:  [phone]  Speech Language Pathology Treatment and Recertification  Patient Details  Name: Laura [name] MRN: [id number] Date of Birth: 08/17/1951 Referring Provider (SLP): Lauraine Rinne, PA-C   Encounter Date: 01/04/2022   End of Session - 01/04/22 1744     Visit Number 44    Number of Visits 57    Date for SLP Re-Evaluation 04/04/22    Authorization Type [place]    Authorization - Visit Number 4    Progress Note Due on Visit 10    SLP Start Time 1300    SLP Stop Time  1400    SLP Time Calculation (min) 60 min    Activity Tolerance Patient tolerated treatment well             Past Medical History:  Diagnosis Date   Acute CVA (cerebrovascular accident) ([place]) 05/24/2021   Diabetes mellitus without complication ([place]) 75/09/2584   Hyperlipidemia 05/23/2021   Hypertension 05/23/2021   Left forearm fracture    in high school--was casted   Stroke (cerebrum) ([place]) 05/26/2021   Stroke ([place]) 05/23/2021    Past Surgical History:  Procedure Laterality Date   CATARACT EXTRACTION W/PHACO Right 11/10/2021   Procedure: CATARACT EXTRACTION PHACO AND INTRAOCULAR LENS PLACEMENT ([place]) RIGHT DIABETIC  22.44 02:07.2;  Surgeon: Leandrew Koyanagi, MD;  Location: [place];  Service: Ophthalmology;  Laterality: Right;   CATARACT EXTRACTION W/PHACO Left 12/01/2021   Procedure: CATARACT EXTRACTION PHACO AND INTRAOCULAR LENS PLACEMENT (IOC) LEFT DIABETIC 3.10 00:41.8;  Surgeon: Leandrew Koyanagi, MD;  Location: [place];  Service: Ophthalmology;  Laterality: Left;   FOOT SURGERY     FRACTURE SURGERY  ?   Foot fracture, no surgery   TONSILLECTOMY      There were no vitals filed for this visit.   Subjective Assessment - 01/04/22 1511     Subjective Arrives with binder    Patient is accompained by: Family member    brother in law   Currently in Pain? No/denies                   ADULT SLP TREATMENT - 01/04/22 1513       General Information   Behavior/Cognition Alert;Cooperative;Pleasant mood    HPI Laura [name] is a 71 year old female with past medical hx noted for DM II, HLD,   admitted at [facility] on 05/23/21 with garbled speech, right facial droop, vision changes and  RUE weakness. MRI brain showed acute infarct in left caudate head and anterior lentiform nucleus, chronic microvascular ischemia. Resulting transcortical motor aphasia, dysarthria and cognitive deficits. Blood sugar was 372 on admission. Admitted to CIR 6/22- 06/02/21.      Cognitive-Linquistic Treatment   Treatment focused on Aphasia;Cognition    Skilled Treatment Trained pt in conversation pre-planning using niece as an example. Pt generated list of topics and questions to ask niece with mod cues. Pt then generated and wrote 5 questions to ask graduate student clinician (min-mod cues). Moved to mod noisy environment [employer] with 3-5 nearby groups). Pt engaged in conversation with graduate student clinician using pre-planned notes. Occasional min-mod A from SLP to revise questions and generate follow-up questions. Pt used facial cues to repeat unintelligible speech in 3/4 opportunities.      Assessment / Recommendations / Plan   Plan Continue with current plan of care      Progression Toward Goals  Progression toward goals Progressing toward goals              SLP Education - 01/04/22 1742     Education Details conversation pre-planning    Person(s) Educated Patient    Methods Explanation    Comprehension Verbalized understanding              SLP Short Term Goals - 01/04/22 1750       Additional Short Term Goals   Additional Short Term Goals Yes      SLP SHORT TERM GOAL #8   Title Patient will repeat unintelligible speech with slower rate in at least 3/5 opportunites with min cues (facial expression) in  5-8 minutes conversation.    Status Achieved      SLP SHORT TERM GOAL  #9   TITLE Patient will take active role in conversation by asking at least 3 questions or requesting repetition in 10 minute discussion with min cues.    Status Achieved      SLP SHORT TERM GOAL  #10   TITLE Pt will tell SLP 3 strategies to improve communication effectiveness.    Time 6    Period Weeks   by visit 65   Status New      SLP SHORT TERM GOAL  #11   TITLE Pt will request repetition or more time if unable to generate response to mod complex questions within 30 seconds 80% of the time with min cues.    Time 6    Period Weeks   by visit 58   Status New              SLP Long Term Goals - 01/04/22 1751       SLP LONG TERM GOAL #1   Title Patient will use compensations for aphasia and dysarthria in 10 minutes moderately complex conversation to express thoughts and opinions with listener comprehension at least 90%.    Time 12    Period Weeks    Status Achieved      SLP LONG TERM GOAL #3   Title Pt will read paragraph-level materials relating to personal interests, financial and medical matters demonstrating comprehension of details >90% accuracy.    Time 12    Period Weeks    Status Achieved      SLP LONG TERM GOAL #4   Title Pt will demonstrate comprehension of verbal information related to health conditions or personal interests by paraphrasing details at least 80% accuracy.    Time 12    Period Weeks    Status Partially Met      SLP LONG TERM GOAL #5   Title Patient will repeat unintelligible speech with min visual cues in a noisy/distracting environment at least 80% of the time.    Time 12    Period Weeks    Status On-going    Target Date 02/20/22      Additional Long Term Goals   Additional Long Term Goals Yes      SLP LONG TERM GOAL #6   Title Pt will use external aids to manage appointments, chores, shopping, and log vital signs with mod I.    Time 12    Period Weeks     Status Achieved      SLP LONG TERM GOAL #7   Title Pt will demonstrate use of strategies, environmental controls, and/or accessibility tools to maximize hearing abilities/communication effectiveness in moderately noisy environment.    Time 12    Period Weeks  Status New    Target Date 04/04/22      SLP LONG TERM GOAL #8   Title Pt will report using compensations/strategies (pre-planning, open-ended questions) to improve confidence and fluency when communicating with family members or acquaintances outside of [place] room over 3 sessions.    Time 12    Period Weeks    Status New              Plan - 01/04/22 1745     Clinical Impression Statement Laura [name] presents with mild anomic aphasia, mild dysarthria, and mild cognitive deficits s/p CVA. Patient has made steady progress and remains motivated to improve. Patient hopeful to move into independent apartment in a continuing care community. She is using a calendar to track appointments, participating in logging daily blood sugars, and shopping for items at the grocery store. Continues to report difficulty with organizing thoughts in conversation, responding to open-ended/abstract questions, and communicating in noisier environments. Pt will receive hearing aids on 01/12/22. Discussed plan of care and pt would like to continue skilled ST at frequency of 1x per week, with focus on conversation, intelligibility and aural rehabilitation. Continue skilled ST to improve ability to communicate socially and increase pt independence and safety.    Speech Therapy Frequency 1x / week    Duration 12 weeks    Treatment/Interventions Language facilitation;Environmental controls;Cueing hierarchy;SLP instruction and feedback;Compensatory techniques;Cognitive reorganization;Functional tasks;Compensatory strategies;Internal/external aids;Multimodal communcation approach;Patient/family education    Potential to Achieve Goals Good    Consulted and Agree with  Plan of Care Patient;Family member/caregiver             Patient will benefit from skilled therapeutic intervention in order to improve the following deficits and impairments:   Aphasia  Dysarthria and anarthria  Cognitive communication deficit    Problem List Patient Active Problem List   Diagnosis Date Noted   Cerebrovascular accident (CVA) ([place]) 09/06/2021   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/06/2021   Hyperlipidemia associated with type 2 diabetes mellitus ([place]) 06/02/2021   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

## 2022-01-04 NOTE — Addendum Note (Signed)
Addended by: Arlana Lindau on: 01/04/2022 06:06 PM   Modules accepted: Orders

## 2022-01-06 ENCOUNTER — Encounter: Payer: Medicare Other | Admitting: Speech Pathology

## 2022-01-06 ENCOUNTER — Ambulatory Visit: Payer: Medicare Other | Admitting: Physical Therapy

## 2022-01-06 ENCOUNTER — Encounter: Payer: Medicare Other | Admitting: Occupational Therapy

## 2022-01-06 ENCOUNTER — Encounter: Payer: Self-pay | Admitting: Internal Medicine

## 2022-01-11 ENCOUNTER — Other Ambulatory Visit: Payer: Self-pay

## 2022-01-11 ENCOUNTER — Ambulatory Visit: Payer: Medicare Other | Attending: Physician Assistant | Admitting: Speech Pathology

## 2022-01-11 ENCOUNTER — Ambulatory Visit: Payer: Medicare Other | Admitting: Physical Therapy

## 2022-01-11 ENCOUNTER — Encounter: Payer: Medicare Other | Admitting: Occupational Therapy

## 2022-01-11 DIAGNOSIS — R471 Dysarthria and anarthria: Secondary | ICD-10-CM | POA: Diagnosis present

## 2022-01-11 DIAGNOSIS — R41841 Cognitive communication deficit: Secondary | ICD-10-CM | POA: Diagnosis present

## 2022-01-11 DIAGNOSIS — R4701 Aphasia: Secondary | ICD-10-CM | POA: Insufficient documentation

## 2022-01-11 DIAGNOSIS — I6300 Cerebral infarction due to thrombosis of unspecified precerebral artery: Secondary | ICD-10-CM | POA: Insufficient documentation

## 2022-01-11 NOTE — Therapy (Signed)
[place] MAIN [facility] SERVICES [address] [place], [place], [address] Phone: [phone]   Fax:  [phone]  Speech Language Pathology Treatment  Patient Details  Name: Laura [name] MRN: [id number] Date of Birth: [birth year] Referring Provider (SLP): Lauraine Rinne, PA-C   Encounter Date: 01/11/2022   End of Session - 01/11/22 1704     Visit Number 45    Number of Visits 92    Date for SLP Re-Evaluation 04/04/22    Authorization Type [place]    Authorization - Visit Number 5    Progress Note Due on Visit 10    SLP Start Time 83    SLP Stop Time  1400    SLP Time Calculation (min) 60 min    Activity Tolerance Patient tolerated treatment well             Past Medical History:  Diagnosis Date   Acute CVA (cerebrovascular accident) ([place]) 05/24/2021   Diabetes mellitus without complication ([place]) 65/68/1275   Hyperlipidemia 05/23/2021   Hypertension 05/23/2021   Left forearm fracture    in high school--was casted   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   Stroke ([place]) 05/23/2021    Past Surgical History:  Procedure Laterality Date   CATARACT EXTRACTION W/PHACO Right 11/10/2021   Procedure: CATARACT EXTRACTION PHACO AND INTRAOCULAR LENS PLACEMENT ([place]) RIGHT DIABETIC  22.44 02:07.2;  Surgeon: Leandrew Koyanagi, MD;  Location: [place];  Service: Ophthalmology;  Laterality: Right;   CATARACT EXTRACTION W/PHACO Left 12/01/2021   Procedure: CATARACT EXTRACTION PHACO AND INTRAOCULAR LENS PLACEMENT ([facility]) LEFT DIABETIC 3.10 00:41.8;  Surgeon: Leandrew Koyanagi, MD;  Location: [place];  Service: Ophthalmology;  Laterality: Left;   FOOT SURGERY     FRACTURE SURGERY  ?   Foot fracture, no surgery   TONSILLECTOMY      There were no vitals filed for this visit.   Subjective Assessment - 01/11/22 1653     Subjective Patient arrived pleasant and motivated with sister present    Currently in Pain? No/denies                    ADULT SLP TREATMENT - 01/11/22 0001       General Information   Behavior/Cognition Alert;Cooperative;Pleasant mood    HPI Laura [name] is a 71 year old female with past medical hx noted for DM II, HLD,   admitted at [facility] on 05/23/21 with garbled speech, right facial droop, vision changes and  RUE weakness. MRI brain showed acute infarct in left caudate head and anterior lentiform nucleus, chronic microvascular ischemia. Resulting transcortical motor aphasia, dysarthria and cognitive deficits. Blood sugar was 372 on admission. Admitted to CIR 6/22- 06/02/21.      Cognitive-Linquistic Treatment   Treatment focused on Aphasia;Cognition    Skilled Treatment Skilled intervention targeted dysarthria with improved intelligibility. Patient demonstrated audibly intelligible speech with 90% accuracy x10 responses in quiet environment. Increased challenge in noisy environment [employer]) as patient demonstrated audible intelligible speech with 40% accuracy x5 verbal utterances though improved to 100% accuracy with visual cues (SLP facial expression). Patient independently generated conversational questions with 60% accuracy x5, error items were rephrased questions previously asked. Patien improved and provided x31moe questions with moderate assistance via SLP demo and verbal cue word provided. Increased verbal responses to personal and liked topics this date.      Assessment / Recommendations / Plan   Plan Continue with current plan of care      Progression Toward  Goals   Progression toward goals Progressing toward goals              SLP Education - 01/11/22 1703     Education Details strategies, conversational return questions    Person(s) Educated Patient    Methods Explanation;Demonstration    Comprehension Verbalized understanding              SLP Short Term Goals - 01/04/22 1750       Additional Short Term Goals   Additional Short Term Goals Yes       SLP SHORT TERM GOAL #8   Title Patient will repeat unintelligible speech with slower rate in at least 3/5 opportunites with min cues (facial expression) in 5-8 minutes conversation.    Status Achieved      SLP SHORT TERM GOAL  #9   TITLE Patient will take active role in conversation by asking at least 3 questions or requesting repetition in 10 minute discussion with min cues.    Status Achieved      SLP SHORT TERM GOAL  #10   TITLE Pt will tell SLP 3 strategies to improve communication effectiveness.    Time 6    Period Weeks   by visit 55   Status New      SLP SHORT TERM GOAL  #11   TITLE Pt will request repetition or more time if unable to generate response to mod complex questions within 30 seconds 80% of the time with min cues.    Time 6    Period Weeks   by visit 54   Status New              SLP Long Term Goals - 01/04/22 1751       SLP LONG TERM GOAL #1   Title Patient will use compensations for aphasia and dysarthria in 10 minutes moderately complex conversation to express thoughts and opinions with listener comprehension at least 90%.    Time 12    Period Weeks    Status Achieved      SLP LONG TERM GOAL #3   Title Pt will read paragraph-level materials relating to personal interests, financial and medical matters demonstrating comprehension of details >90% accuracy.    Time 12    Period Weeks    Status Achieved      SLP LONG TERM GOAL #4   Title Pt will demonstrate comprehension of verbal information related to health conditions or personal interests by paraphrasing details at least 80% accuracy.    Time 12    Period Weeks    Status Partially Met      SLP LONG TERM GOAL #5   Title Patient will repeat unintelligible speech with min visual cues in a noisy/distracting environment at least 80% of the time.    Time 12    Period Weeks    Status On-going    Target Date 02/20/22      Additional Long Term Goals   Additional Long Term Goals Yes      SLP LONG  TERM GOAL #6   Title Pt will use external aids to manage appointments, chores, shopping, and log vital signs with mod I.    Time 12    Period Weeks    Status Achieved      SLP LONG TERM GOAL #7   Title Pt will demonstrate use of strategies, environmental controls, and/or accessibility tools to maximize hearing abilities/communication effectiveness in moderately noisy environment.    Time 12  Period Weeks    Status New    Target Date 04/04/22      SLP LONG TERM GOAL #8   Title Pt will report using compensations/strategies (pre-planning, open-ended questions) to improve confidence and fluency when communicating with family members or acquaintances outside of [place] room over 3 sessions.    Time 12    Period Weeks    Status New              Plan - 01/11/22 1704     Clinical Impression Statement Pariss Hommes presents with mild anomic aphasia, mild dysarthria, and mild cognitive deficits s/p CVA. Patient has made steady progress and remains motivated to improve. Patient hopeful to move into independent apartment in a continuing care community. She is using a calendar to track appointments, participating in logging daily blood sugars, and shopping for items at the grocery store. Patient demonstrated improved organization of thoughts in conversation via pre-planning topics and questions with moderate assistance for question generation though independently provided declarative responses. Targeted improved intelligiblity when communicating in noisier environments this date, patient benefitted from visual cues for increased volume. Pt will receive hearing aids on 01/12/22. Discussed plan of care and pt would like to continue skilled ST at frequency of 1x per week, with focus on conversation, intelligibility and aural rehabilitation. Continue skilled ST to improve ability to communicate socially and increase pt independence and safety.    Speech Therapy Frequency 1x /week    Duration 12 weeks     Treatment/Interventions Language facilitation;Environmental controls;Cueing hierarchy;SLP instruction and feedback;Compensatory techniques;Cognitive reorganization;Functional tasks;Compensatory strategies;Internal/external aids;Multimodal communcation approach;Patient/family education    Potential to Achieve Goals Good    SLP Home Exercise Plan TBD    Consulted and Agree with Plan of Care Patient;Family member/caregiver             Patient will benefit from skilled therapeutic intervention in order to improve the following deficits and impairments:   Aphasia  Cognitive communication deficit  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 artery [facility])    Problem List Patient Active Problem List   Diagnosis Date Noted   Cerebrovascular accident (CVA) ([place]) 09/06/2021   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/06/2021   Hyperlipidemia associated with type 2 diabetes mellitus ([place]) 06/02/2021   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 Rollin Kotowski, M.A. CCC-SLP Adult-based Speech Language Pathologist [place] [phone]  [place], [place] 01/11/2022, 5:08 PM  [place] MAIN [facility] SERVICES [address] [place], [place], [address] Phone: [phone]   Fax:  [phone]   Name: Jasslyn Finkel MRN: [id number] Date of Birth: 1950/12/18

## 2022-01-11 NOTE — Patient Instructions (Signed)
Continue speech practice with strategy "talk loud" Increase conversational participation with return questions (ex: someone asks what you did yesterday, return question ask what they did yesterday)

## 2022-01-13 ENCOUNTER — Ambulatory Visit: Payer: Medicare Other | Admitting: Physical Therapy

## 2022-01-13 ENCOUNTER — Encounter: Payer: Medicare Other | Admitting: Occupational Therapy

## 2022-01-13 ENCOUNTER — Encounter: Payer: Medicare Other | Admitting: Speech Pathology

## 2022-01-18 ENCOUNTER — Ambulatory Visit: Payer: Medicare Other | Admitting: Physical Therapy

## 2022-01-18 ENCOUNTER — Ambulatory Visit: Payer: Medicare Other | Admitting: Speech Pathology

## 2022-01-18 ENCOUNTER — Other Ambulatory Visit: Payer: Self-pay

## 2022-01-18 ENCOUNTER — Encounter: Payer: Medicare Other | Admitting: Occupational Therapy

## 2022-01-18 DIAGNOSIS — R4701 Aphasia: Secondary | ICD-10-CM

## 2022-01-18 DIAGNOSIS — R41841 Cognitive communication deficit: Secondary | ICD-10-CM

## 2022-01-18 DIAGNOSIS — R471 Dysarthria and anarthria: Secondary | ICD-10-CM

## 2022-01-18 NOTE — Therapy (Signed)
[place] MAIN [facility] SERVICES [address] [place], [place], [address] Phone: [phone]   Fax:  [phone]  Speech Language Pathology Treatment  Patient Details  Name: Laura [name] MRN: [id number] Date of Birth: [birth year] Referring Provider (SLP): Lauraine Rinne, PA-C   Encounter Date: 01/18/2022   End of Session - 01/18/22 1413     Visit Number 82    Number of Visits 39    Date for SLP Re-Evaluation 04/04/22    Authorization Type [place]    Authorization - Visit Number 6    Progress Note Due on Visit 10    SLP Start Time 1300    SLP Stop Time  1400    SLP Time Calculation (min) 60 min    Activity Tolerance Patient tolerated treatment well             Past Medical History:  Diagnosis Date   Acute CVA (cerebrovascular accident) ([place]) 05/24/2021   Diabetes mellitus without complication ([place]) 88/28/0034   Hyperlipidemia 05/23/2021   Hypertension 05/23/2021   Left forearm fracture    in high school--was casted   Stroke (cerebrum) ([place]) 05/26/2021   Stroke ([place]) 05/23/2021    Past Surgical History:  Procedure Laterality Date   CATARACT EXTRACTION W/PHACO Right 11/10/2021   Procedure: CATARACT EXTRACTION PHACO AND INTRAOCULAR LENS PLACEMENT ([place]) RIGHT DIABETIC  22.44 02:07.2;  Surgeon: Leandrew Koyanagi, MD;  Location: [place];  Service: Ophthalmology;  Laterality: Right;   CATARACT EXTRACTION W/PHACO Left 12/01/2021   Procedure: CATARACT EXTRACTION PHACO AND INTRAOCULAR LENS PLACEMENT ([facility]) LEFT DIABETIC 3.10 00:41.8;  Surgeon: Leandrew Koyanagi, MD;  Location: [place];  Service: Ophthalmology;  Laterality: Left;   FOOT SURGERY     FRACTURE SURGERY  ?   Foot fracture, no surgery   TONSILLECTOMY      There were no vitals filed for this visit.   Subjective Assessment - 01/18/22 1412     Subjective "They're annoying," re: hearing aids    Patient is accompained by: Family member   sister  Mardene Celeste   Currently in Pain? No/denies                   ADULT SLP TREATMENT - 01/18/22 1413       General Information   Behavior/Cognition Alert;Cooperative;Pleasant mood    HPI Laura [name] is a 71 year old female with past medical hx noted for DM II, HLD,   admitted at [facility] on 05/23/21 with garbled speech, right facial droop, vision changes and  RUE weakness. MRI brain showed acute infarct in left caudate head and anterior lentiform nucleus, chronic microvascular ischemia. Resulting transcortical motor aphasia, dysarthria and cognitive deficits. Blood sugar was 372 on admission. Admitted to CIR 6/22- 06/02/21.      Cognitive-Linquistic Treatment   Treatment focused on Aphasia;Cognition    Skilled Treatment Focused session on communication effectiveness and auditory discrimination given new hearing aids. Pt reports improved ability to hear background noise and sounds of birds. Dislikes wearing hearing aids, however, due to it making her feel "old." Validated pt's feelings and provided encouragement, education that consistent wearing of her hearing aids will help increase her participation in social interactions and may reduce risk for cognitive decline. Auditory discrimination task with UNO card game; pt comprehension of colors, numbers 100% accuracy. During game play, extended time for processing noted with visual stimulation; when game changer rule applied for increased challenge (auditory discrimination only, with no visual cues), pt habitually repeated verbal  stimuli: this decreased response latency. Education on tasks pt/family can complete at home to help pt become accustomed to her hearing aids.      Assessment / Recommendations / Plan   Plan Continue with current plan of care      Progression Toward Goals   Progression toward goals Progressing toward goals              SLP Education - 01/18/22 1413     Education Details importance of wearing hearing aids consistently     Person(s) Educated Patient    Methods Explanation    Comprehension Verbalized understanding              SLP Short Term Goals - 01/04/22 1750       Additional Short Term Goals   Additional Short Term Goals Yes      SLP SHORT TERM GOAL #8   Title Patient will repeat unintelligible speech with slower rate in at least 3/5 opportunites with min cues (facial expression) in 5-8 minutes conversation.    Status Achieved      SLP SHORT TERM GOAL  #9   TITLE Patient will take active role in conversation by asking at least 3 questions or requesting repetition in 10 minute discussion with min cues.    Status Achieved      SLP SHORT TERM GOAL  #10   TITLE Pt will tell SLP 3 strategies to improve communication effectiveness.    Time 6    Period Weeks   by visit 106   Status New      SLP SHORT TERM GOAL  #11   TITLE Pt will request repetition or more time if unable to generate response to mod complex questions within 30 seconds 80% of the time with min cues.    Time 6    Period Weeks   by visit 37   Status New              SLP Long Term Goals - 01/04/22 1751       SLP LONG TERM GOAL #1   Title Patient will use compensations for aphasia and dysarthria in 10 minutes moderately complex conversation to express thoughts and opinions with listener comprehension at least 90%.    Time 12    Period Weeks    Status Achieved      SLP LONG TERM GOAL #3   Title Pt will read paragraph-level materials relating to personal interests, financial and medical matters demonstrating comprehension of details >90% accuracy.    Time 12    Period Weeks    Status Achieved      SLP LONG TERM GOAL #4   Title Pt will demonstrate comprehension of verbal information related to health conditions or personal interests by paraphrasing details at least 80% accuracy.    Time 12    Period Weeks    Status Partially Met      SLP LONG TERM GOAL #5   Title Patient will repeat unintelligible speech with min  visual cues in a noisy/distracting environment at least 80% of the time.    Time 12    Period Weeks    Status On-going    Target Date 02/20/22      Additional Long Term Goals   Additional Long Term Goals Yes      SLP LONG TERM GOAL #6   Title Pt will use external aids to manage appointments, chores, shopping, and log vital signs with mod I.  Time 12    Period Weeks    Status Achieved      SLP LONG TERM GOAL #7   Title Pt will demonstrate use of strategies, environmental controls, and/or accessibility tools to maximize hearing abilities/communication effectiveness in moderately noisy environment.    Time 12    Period Weeks    Status New    Target Date 04/04/22      SLP LONG TERM GOAL #8   Title Pt will report using compensations/strategies (pre-planning, open-ended questions) to improve confidence and fluency when communicating with family members or acquaintances outside of [place] room over 3 sessions.    Time 12    Period Weeks    Status New              Plan - 01/18/22 1421     Clinical Impression Statement Laura [name] presents with mild anomic aphasia, mild dysarthria, and mild cognitive deficits s/p CVA. Patient has made steady progress and remains motivated to improve. Patient hopeful to move into independent apartment in a continuing care community. She is using a calendar to track appointments, participating in logging daily blood sugars, and shopping for items at the grocery store. Skilled intervention with focus on aural rehabilitation/communication effectiveness as pt adjusting to listening with new hearing aids. Discussed Continue skilled ST at frequency of 1x per week, with focus on conversation, intelligibility and aural rehabilitation. Continue skilled ST to improve ability to communicate socially and increase pt independence and safety.    Speech Therapy Frequency 1x /week    Duration 12 weeks    Treatment/Interventions Language facilitation;Environmental  controls;Cueing hierarchy;SLP instruction and feedback;Compensatory techniques;Cognitive reorganization;Functional tasks;Compensatory strategies;Internal/external aids;Multimodal communcation approach;Patient/family education    Potential to Achieve Goals Good    SLP Home Exercise Plan TBD    Consulted and Agree with Plan of Care Patient;Family member/caregiver             Patient will benefit from skilled therapeutic intervention in order to improve the following deficits and impairments:   Aphasia  Cognitive communication deficit  Dysarthria and anarthria    Problem List Patient Active Problem List   Diagnosis Date Noted   Cerebrovascular accident (CVA) ([place]) 09/06/2021   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/06/2021   Hyperlipidemia associated with type 2 diabetes mellitus ([place]) 06/02/2021   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

## 2022-01-20 ENCOUNTER — Ambulatory Visit: Payer: Medicare Other | Admitting: Physical Therapy

## 2022-01-20 ENCOUNTER — Encounter: Payer: Medicare Other | Admitting: Speech Pathology

## 2022-01-20 ENCOUNTER — Encounter: Payer: Medicare Other | Admitting: Occupational Therapy

## 2022-01-25 ENCOUNTER — Encounter: Payer: Medicare Other | Admitting: Occupational Therapy

## 2022-01-25 ENCOUNTER — Ambulatory Visit: Payer: Medicare Other | Admitting: Physical Therapy

## 2022-01-25 ENCOUNTER — Encounter: Payer: Medicare Other | Admitting: Speech Pathology

## 2022-01-27 ENCOUNTER — Encounter: Payer: Medicare Other | Admitting: Occupational Therapy

## 2022-01-27 ENCOUNTER — Encounter: Payer: Medicare Other | Admitting: Speech Pathology

## 2022-01-27 ENCOUNTER — Ambulatory Visit: Payer: Medicare Other | Admitting: Physical Therapy

## 2022-01-28 ENCOUNTER — Ambulatory Visit: Payer: Medicare Other | Admitting: Internal Medicine

## 2022-01-31 ENCOUNTER — Ambulatory Visit: Payer: Medicare Other | Admitting: Internal Medicine

## 2022-02-01 ENCOUNTER — Ambulatory Visit: Payer: Medicare Other | Admitting: Physical Therapy

## 2022-02-01 ENCOUNTER — Ambulatory Visit: Payer: Medicare Other | Admitting: Speech Pathology

## 2022-02-01 ENCOUNTER — Encounter: Payer: Medicare Other | Admitting: Occupational Therapy

## 2022-02-01 ENCOUNTER — Other Ambulatory Visit: Payer: Self-pay

## 2022-02-01 DIAGNOSIS — R4701 Aphasia: Secondary | ICD-10-CM

## 2022-02-01 DIAGNOSIS — R471 Dysarthria and anarthria: Secondary | ICD-10-CM

## 2022-02-01 DIAGNOSIS — R41841 Cognitive communication deficit: Secondary | ICD-10-CM

## 2022-02-01 NOTE — Therapy (Signed)
[place] MAIN [facility] SERVICES [address] [place], [place], [address] Phone: [phone]   Fax:  [phone]  Speech Language Pathology Treatment and Discharge Summary  Patient Details  Name: Laura [name] MRN: [id number] Date of Birth: 1951-07-21 Referring Provider (SLP): Lauraine Rinne, PA-C   Encounter Date: 02/01/2022   End of Session - 02/01/22 1628     Visit Number 47    Number of Visits 49    Date for SLP Re-Evaluation 04/04/22    Authorization Type [place]    Authorization - Visit Number 7    Progress Note Due on Visit 10    SLP Start Time 1307    SLP Stop Time  1400    SLP Time Calculation (min) 53 min    Activity Tolerance Patient tolerated treatment well             Past Medical History:  Diagnosis Date   Acute CVA (cerebrovascular accident) ([place]) 05/24/2021   Diabetes mellitus without complication ([place]) 70/17/7939   Hyperlipidemia 05/23/2021   Hypertension 05/23/2021   Left forearm fracture    in high school--was casted   Stroke (cerebrum) ([place]) 05/26/2021   Stroke ([place]) 05/23/2021    Past Surgical History:  Procedure Laterality Date   CATARACT EXTRACTION W/PHACO Right 11/10/2021   Procedure: CATARACT EXTRACTION PHACO AND INTRAOCULAR LENS PLACEMENT ([place]) RIGHT DIABETIC  22.44 02:07.2;  Surgeon: Leandrew Koyanagi, MD;  Location: [facility];  Service: Ophthalmology;  Laterality: Right;   CATARACT EXTRACTION W/PHACO Left 12/01/2021   Procedure: CATARACT EXTRACTION PHACO AND INTRAOCULAR LENS PLACEMENT (I[facility]) LEFT DIABETIC 3.10 00:41.8;  Surgeon: Leandrew Koyanagi, MD;  Location: [facility];  Service: Ophthalmology;  Laterality: Left;   FOOT SURGERY     FRACTURE SURGERY  ?   Foot fracture, no surgery   TONSILLECTOMY      There were no vitals filed for this visit.   Subjective Assessment - 02/01/22 1609     Subjective "I think I'm ready to be done."    Patient is accompained by:  Family member   sister Mardene Celeste   Currently in Pain? No/denies                   ADULT SLP TREATMENT - 02/01/22 1609       General Information   Behavior/Cognition Alert;Cooperative;Pleasant mood    HPI Laura [name] is a 71 year old female with past medical hx noted for DM II, HLD,   admitted at [facility] on 05/23/21 with garbled speech, right facial droop, vision changes and  RUE weakness. MRI brain showed acute infarct in left caudate head and anterior lentiform nucleus, chronic microvascular ischemia. Resulting transcortical motor aphasia, dysarthria and cognitive deficits. Blood sugar was 372 on admission. Admitted to CIR 6/22- 06/02/21.      Cognitive-Linquistic Treatment   Treatment focused on Aphasia;Cognition    Skilled Treatment Patient and sister report communication continues to improve as does pt confidence when using her hearing aids. Auditory discrimination task in quiet environment with SLP's face masked: 100% accuracy. Moved to cafeteria to target in noisy environment. In max noise with masking, pt accuracy 95% for discriminating between final consonant sounds in minimal pairs (F:4) with familiar speaker (SLP). Accuracy decreased to 70% in noise with less familiar speaker/reduced vocal intensity (graduate clinician). Trained communication effectiveness strategies, including repeating/confirming message, requesting repetition, and requesting visual cues. After initial instruction, pt utilized strategies >80% of opportunities when breakdowns occurred, and discrimination accuracy increased to 90%.  Patient demonstrated understanding of environmental modifications such as selecting quieter location, sitting close to [place] speaker to increase communication effectiveness in noisy environment.  Reports several meals with family to celebrate her birthday, and sister reports pt is initiating and responding appropriately in conversations. Pt reports she is pleased with current functional  level and is in agreement with d/c today with goals partially met.      Assessment / Recommendations / Plan   Plan Discharge SLP treatment due to (comment)   goals met/partially met, pt pleased with current functional level and requests d/c     Progression Toward Goals   Progression toward goals Goals met, education completed, patient discharged from SLP              SLP Education - 02/01/22 1627     Education Details communication effectiveness strategies, activities for home    Person(s) Educated Patient;Other (comment)   sister   Methods Explanation;Handout    Comprehension Verbalized understanding;Returned demonstration              SLP Short Term Goals - 02/01/22 1609       SLP SHORT TERM GOAL #8   Title Patient will repeat unintelligible speech with slower rate in at least 3/5 opportunites with min cues (facial expression) in 5-8 minutes conversation.    Status Achieved      SLP SHORT TERM GOAL  #9   TITLE Patient will take active role in conversation by asking at least 3 questions or requesting repetition in 10 minute discussion with min cues.    Status Achieved      SLP SHORT TERM GOAL  #10   TITLE Pt will tell SLP 3 strategies to improve communication effectiveness.    Time 6    Period Weeks   by visit 72   Status Achieved      SLP SHORT TERM GOAL  #11   TITLE Pt will request repetition or more time if unable to generate response to mod complex questions within 30 seconds 80% of the time with min cues.    Time 6    Period Weeks   by visit 38   Status Achieved              SLP Long Term Goals - 02/01/22 1610       SLP LONG TERM GOAL #1   Title Patient will use compensations for aphasia and dysarthria in 10 minutes moderately complex conversation to express thoughts and opinions with listener comprehension at least 90%.    Time 12    Period Weeks    Status Achieved      SLP LONG TERM GOAL #3   Title Pt will read paragraph-level materials  relating to personal interests, financial and medical matters demonstrating comprehension of details >90% accuracy.    Time 12    Period Weeks    Status Achieved      SLP LONG TERM GOAL #4   Title Pt will demonstrate comprehension of verbal information related to health conditions or personal interests by paraphrasing details at least 80% accuracy.    Time 12    Period Weeks    Status Partially Met      SLP LONG TERM GOAL #5   Title Patient will repeat unintelligible speech with min visual cues in a noisy/distracting environment at least 80% of the time.    Time 12    Period Weeks    Status Achieved      SLP LONG  TERM GOAL #6   Title Pt will use external aids to manage appointments, chores, shopping, and log vital signs with mod I.    Time 12    Period Weeks    Status Achieved      SLP LONG TERM GOAL #7   Title Pt will demonstrate use of strategies, environmental controls, and/or accessibility tools to maximize hearing abilities/communication effectiveness in moderately noisy environment.    Time 12    Period Weeks    Status Achieved      SLP LONG TERM GOAL #8   Title Pt will report using compensations/strategies (pre-planning, open-ended questions) to improve confidence and fluency when communicating with family members or acquaintances outside of [place] room over 3 sessions.    Baseline pt pleased with current functional level    Time 12    Period Weeks    Status Partially Met              Plan - 02/01/22 1628     Clinical Impression Statement Laura [name] is compensating well for mild anomic aphasia, mild dysarthria, and mild cognitive deficits s/p CVA. She continues to increase engagement in conversations with family and is now fielding her own phone calls to manage medical appointments. Pt reports improved confidence in her communication abilities and is looking to sister less often to assist her in conversation. Pt used aphasia and dysarthria compensations effectively in  conversation and repeated self when minimal visual cues given. Participation in conversation has improved with new hearing aids; pt reports adjusting well over the past several weeks. Provided education/ training in tasks to continue listening practice/adjustment at home. Patient is pleased with current functional level and is discharged at her request.    Speech Therapy Frequency 1x /week    Duration 12 weeks    Treatment/Interventions Language facilitation;Environmental controls;Cueing hierarchy;SLP instruction and feedback;Compensatory techniques;Cognitive reorganization;Functional tasks;Compensatory strategies;Internal/external aids;Multimodal communcation approach;Patient/family education    Potential to Achieve Goals Good    SLP Home Exercise Plan TBD    Consulted and Agree with Plan of Care Patient;Family member/caregiver             Patient will benefit from skilled therapeutic intervention in order to improve the following deficits and impairments:   Aphasia  Cognitive communication deficit  Dysarthria and anarthria    Problem List Patient Active Problem List   Diagnosis Date Noted   Cerebrovascular accident (CVA) ([place]) 09/06/2021   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/06/2021   Hyperlipidemia associated with type 2 diabetes mellitus ([place]) 06/02/2021   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 Overall 10/12 LTGs fully met and 2/12 partially met. Patient has progressed  significantly with verbal output, fluency and intelligibility; now engaging  in mod complex conversation up to 15 minutes.    Remaining deficits: Mild aphasia, mild dysarthria, and mild cognitive communication deficits for which pt is compensating effectively at this time   Education / Equipment: Aural rehabilitation, activities to challenge/train listening   Patient agrees to discharge. Patient goals were  partially met. Patient is being discharged due to being pleased with the current functional level.Deneise Lever, [place], CCC-SLP Speech-Language Pathologist [phone]   Aliene Altes, [place] 02/01/2022, 4:34 PM  [place] [employer] MAIN [facility] SERVICES [address] [place], [place], [address] Phone: [phone]   Fax:  [phone]   Name: Laura [name] MRN: [id number] Date of Birth: 25-Oct-1951

## 2022-02-03 ENCOUNTER — Encounter: Payer: Medicare Other | Admitting: Occupational Therapy

## 2022-02-03 ENCOUNTER — Ambulatory Visit: Payer: Medicare Other | Admitting: Physical Therapy

## 2022-02-03 ENCOUNTER — Encounter: Payer: Medicare Other | Admitting: Speech Pathology

## 2022-02-04 ENCOUNTER — Ambulatory Visit (INDEPENDENT_AMBULATORY_CARE_PROVIDER_SITE_OTHER): Payer: Medicare Other | Admitting: Internal Medicine

## 2022-02-04 ENCOUNTER — Other Ambulatory Visit: Payer: Self-pay

## 2022-02-04 ENCOUNTER — Encounter: Payer: Self-pay | Admitting: Internal Medicine

## 2022-02-04 VITALS — BP 122/74 | HR 95 | Ht 68.0 in | Wt 131.0 lb

## 2022-02-04 DIAGNOSIS — I10 Essential (primary) hypertension: Secondary | ICD-10-CM

## 2022-02-04 DIAGNOSIS — E118 Type 2 diabetes mellitus with unspecified complications: Secondary | ICD-10-CM

## 2022-02-04 DIAGNOSIS — H9193 Unspecified hearing loss, bilateral: Secondary | ICD-10-CM

## 2022-02-04 DIAGNOSIS — I6300 Cerebral infarction due to thrombosis of unspecified precerebral artery: Secondary | ICD-10-CM | POA: Diagnosis not present

## 2022-02-04 DIAGNOSIS — E559 Vitamin D deficiency, unspecified: Secondary | ICD-10-CM

## 2022-02-04 DIAGNOSIS — I639 Cerebral infarction, unspecified: Secondary | ICD-10-CM | POA: Diagnosis not present

## 2022-02-04 NOTE — Progress Notes (Signed)
Date:  02/04/2022   Name:  Brandace Cargle   DOB:  1951-04-18   MRN:  [id number]   Chief Complaint: Diabetes and Hypertension  Diabetes She presents for her follow-up diabetic visit. She has type 2 diabetes mellitus. Her disease course has been improving. Pertinent negatives for hypoglycemia include no headaches, nervousness/anxiousness or tremors. Pertinent negatives for diabetes include no chest pain, no fatigue, no polydipsia and no polyuria. Current diabetic treatment includes oral agent (monotherapy) and insulin injections (has increased metformin to 1000 mg bid and decreased insulin to 14 units.). She is following a generally healthy diet. She participates in exercise daily. Her breakfast blood glucose is taken between 6-7 am. Her breakfast blood glucose range is generally 90-110 mg/dl. Her dinner blood glucose is taken between 6-7 pm. Her dinner blood glucose range is generally 110-130 mg/dl. Eye exam is current.  Hypertension This is a chronic problem. The problem is controlled. Pertinent negatives include no chest pain, headaches, palpitations or shortness of breath. Past treatments include calcium channel blockers.   CVA - she is doing great, finished ST and has moved to her own apartment.  She now has hearing aids which has helped tremendously with her interactions.  She will have a driving test in the near future.  She also had cataract surgery and no longer needs corrective lenses.  Lab Results  Component Value Date   CHOL 79 (L) 07/26/2021   HDL 27 (L) 07/26/2021   LDLCALC 36 07/26/2021   TRIG 77 07/26/2021   CHOLHDL 2.9 07/26/2021   Lab Results  Component Value Date   TSH 3.160 05/23/2021   Lab Results  Component Value Date   HGBA1C 5.7 (A) 10/27/2021   Lab Results  Component Value Date   WBC 6.9 05/31/2021   HGB 13.4 05/31/2021   HCT 40.3 05/31/2021   MCV 86.1 05/31/2021   PLT 293 05/31/2021   Lab Results  Component Value Date   ALT 35 (H) 07/26/2021   AST 22  07/26/2021   ALKPHOS 83 07/26/2021   BILITOT 0.3 07/26/2021   Lab Results  Component Value Date   VD25OH 11.31 (L) 05/27/2021     Review of Systems  Constitutional:  Negative for appetite change, fatigue, fever and unexpected weight change.  HENT:  Negative for tinnitus and trouble swallowing.   Eyes:  Negative for visual disturbance.  Respiratory:  Negative for cough, chest tightness and shortness of breath.   Cardiovascular:  Negative for chest pain, palpitations and leg swelling.  Gastrointestinal:  Negative for abdominal pain.  Endocrine: Negative for polydipsia and polyuria.  Genitourinary:  Negative for dysuria and hematuria.  Musculoskeletal:  Negative for arthralgias.  Skin:  Negative for color change, rash and wound.  Neurological:  Negative for tremors, numbness and headaches.  Psychiatric/Behavioral:  Negative for dysphoric mood and sleep disturbance. The patient is not nervous/anxious.    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) 06/02/2021   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 (CVA) 06/02/2021   Vitamin D deficiency 06/02/2021   Type II diabetes mellitus with complication (HCC)    Vitamin B12 deficiency    Essential hypertension 05/23/2021   Eyesight diminished 12/05/2020   Diminished hearing 12/05/2020    No Known Allergies  Past Surgical History:  Procedure Laterality Date   CATARACT EXTRACTION W/PHACO Right 11/10/2021   Procedure: CATARACT EXTRACTION PHACO AND INTRAOCULAR LENS PLACEMENT ([facility]) RIGHT DIABETIC  22.44 02:07.2;  Surgeon: Lockie Mola, MD;  [facility];  Service: Ophthalmology;  Laterality: Right;   CATARACT EXTRACTION W/PHACO Left 12/01/2021   Procedure:  CATARACT EXTRACTION PHACO AND INTRAOCULAR LENS PLACEMENT (IOC) LEFT DIABETIC 3.10 00:41.8;  Surgeon: Lockie Mola, MD;  Location: [facility];  Service: Ophthalmology;  Laterality: Left;   FOOT SURGERY     FRACTURE SURGERY  ?   Foot fracture, no surgery   TONSILLECTOMY      Social History   Tobacco Use   Smoking status: Never    Passive exposure: Never   Smokeless tobacco: Never  Vaping Use   Vaping Use: Never used  Substance Use Topics   Alcohol use: Never   Drug use: Never     Medication list has been reviewed and updated.  Current Meds  Medication Sig   Accu-Chek Softclix Lancets lancets Use twice a day   amLODipine (NORVASC) 5 MG tablet TAKE 1 TABLET (5 MG TOTAL) BY MOUTH DAILY.   aspirin 81 MG EC tablet Take 1 tablet (81 mg total) by mouth daily. Swallow whole.   atorvastatin (LIPITOR) 80 MG tablet TAKE 1 TABLET BY MOUTH EVERY EVENING   BD PEN NEEDLE MICRO U/F 32G X 6 MM MISC USE DAILY AS DIRECTED   cyanocobalamin 1000 MCG tablet Take 1 tablet (1,000 mcg total) by mouth daily.   glucose blood (ACCU-CHEK GUIDE) test strip Test blood sugar twice a day   Insulin Glargine (BASAGLAR KWIKPEN) 100 UNIT/ML Inject 19 Units into the skin daily. (Patient taking differently: Inject 14 Units into the skin daily.)   metFORMIN (GLUCOPHAGE) 1000 MG tablet Take 1 tablet (1,000 mg total) by mouth 2 (two) times daily with a meal.   Vitamin D, Ergocalciferol, (DRISDOL) 1.25 MG (50000 UNIT) CAPS capsule Take 1 capsule (50,000 Units total) by mouth every 7 (seven) days.    PHQ 2/9 Scores 02/04/2022 12/20/2021 10/27/2021 09/09/2021  PHQ - 2 Score 0 0 0 0  PHQ- 9 Score 0 - 0 3    GAD 7 : Generalized Anxiety Score 02/04/2022 10/27/2021 07/26/2021 06/02/2021  Nervous, Anxious, on Edge 0 0 0 3  Control/stop worrying 0 0 0 3  Worry too much - different things 0 0 0 2  Trouble relaxing 0 0 0 2  Restless 0 0 0 0  Easily annoyed or irritable 0 0 0 0  Afraid - awful might happen 0 0 0 1   Total GAD 7 Score 0 0 0 11  Anxiety Difficulty Not difficult at all Not difficult at all - Very difficult    BP Readings from Last 3 Encounters:  02/04/22 122/74  12/20/21 120/70  12/01/21 (!) 113/58    Physical Exam Vitals and nursing note reviewed.  Constitutional:      General: She is not in acute distress.    Appearance: She is well-developed.  HENT:     Head: Normocephalic and atraumatic.  Cardiovascular:     Rate and Rhythm: Normal rate and regular rhythm.     Pulses: Normal pulses.  Pulmonary:     Effort: Pulmonary effort is normal. No respiratory distress.     Breath sounds: No wheezing or rhonchi.  Musculoskeletal:        General: Normal range of motion.     Cervical back: Normal range of motion.     Right  lower leg: No edema.     Left lower leg: No edema.  Lymphadenopathy:     Cervical: No cervical adenopathy.  Skin:    General: Skin is warm and dry.     Capillary Refill: Capillary refill takes less than 2 seconds.     Findings: No rash.  Neurological:     General: No focal deficit present.     Mental Status: She is alert and oriented to person, place, and time.  Psychiatric:        Mood and Affect: Mood normal.        Behavior: Behavior normal.    Wt Readings from Last 3 Encounters:  02/04/22 131 lb (59.4 kg)  12/20/21 133 lb 6.4 oz (60.5 kg)  12/01/21 134 lb (60.8 kg)    BP 122/74    Pulse 95    Ht 5\' 8"  (1.727 m)    Wt 131 lb (59.4 kg)    SpO2 99%    BMI 19.92 kg/m   Assessment and Plan: 1. Essential hypertension Clinically stable exam with well controlled BP. Tolerating medications without side effects at this time. Pt to continue current regimen and low sodium diet; benefits of regular exercise as able discussed. - CBC with Differential/Platelet  2. Type II diabetes mellitus with complication (HCC) BS controlled on metformin 1000 mg bid and Insulin 14 units. Her BS are excellent - all less than 130.   She will titrate down the insulin by  3-5 units every 2 weeks with the goal to keep BS < 140. - Comprehensive metabolic panel - Hemoglobin A1c  3. Cerebrovascular accident (CVA), unspecified mechanism (HCC) Excellent recovery - now living independently Continue ASA and Atorvastatin  4. Decreased hearing of both ears Compensated by hearing aids  5. Vitamin D deficiency Now taking supplement - will recheck level and advise - VITAMIN D 25 Hydroxy (Vit-D Deficiency, Fractures)   Partially dictated using Animal nutritionist. Any errors are unintentional.  Bari Edward, MD [facility]  02/04/2022

## 2022-02-04 NOTE — Patient Instructions (Signed)
Decrease insulin by 3-5 units every 2 weeks as long as the blood sugar stays less than 140. ? ? ?

## 2022-02-05 LAB — CBC WITH DIFFERENTIAL/PLATELET
Basophils Absolute: 0.1 10*3/uL (ref 0.0–0.2)
Basos: 1 %
EOS (ABSOLUTE): 0.3 10*3/uL (ref 0.0–0.4)
Eos: 3 %
Hematocrit: 35.5 % (ref 34.0–46.6)
Hemoglobin: 12 g/dL (ref 11.1–15.9)
Immature Grans (Abs): 0 10*3/uL (ref 0.0–0.1)
Immature Granulocytes: 0 %
Lymphocytes Absolute: 2.1 10*3/uL (ref 0.7–3.1)
Lymphs: 24 %
MCH: 29 pg (ref 26.6–33.0)
MCHC: 33.8 g/dL (ref 31.5–35.7)
MCV: 86 fL (ref 79–97)
Monocytes Absolute: 0.8 10*3/uL (ref 0.1–0.9)
Monocytes: 9 %
Neutrophils Absolute: 5.5 10*3/uL (ref 1.4–7.0)
Neutrophils: 63 %
Platelets: 386 10*3/uL (ref 150–450)
RBC: 4.14 x10E6/uL (ref 3.77–5.28)
RDW: 12.7 % (ref 11.7–15.4)
WBC: 8.7 10*3/uL (ref 3.4–10.8)

## 2022-02-05 LAB — COMPREHENSIVE METABOLIC PANEL
ALT: 49 IU/L — ABNORMAL HIGH (ref 0–32)
AST: 31 IU/L (ref 0–40)
Albumin/Globulin Ratio: 2.1 (ref 1.2–2.2)
Albumin: 4.5 g/dL (ref 3.8–4.8)
Alkaline Phosphatase: 158 IU/L — ABNORMAL HIGH (ref 44–121)
BUN/Creatinine Ratio: 25 (ref 12–28)
BUN: 17 mg/dL (ref 8–27)
Bilirubin Total: <0.2 mg/dL (ref 0.0–1.2)
CO2: 26 mmol/L (ref 20–29)
Calcium: 10 mg/dL (ref 8.7–10.3)
Chloride: 100 mmol/L (ref 96–106)
Creatinine, Ser: 0.68 mg/dL (ref 0.57–1.00)
Globulin, Total: 2.1 g/dL (ref 1.5–4.5)
Glucose: 128 mg/dL — ABNORMAL HIGH (ref 70–99)
Potassium: 4.9 mmol/L (ref 3.5–5.2)
Sodium: 143 mmol/L (ref 134–144)
Total Protein: 6.6 g/dL (ref 6.0–8.5)
eGFR: 94 mL/min/{1.73_m2} (ref >59)

## 2022-02-05 LAB — HEMOGLOBIN A1C
Est. average glucose Bld gHb Est-mCnc: 108 mg/dL
Hgb A1c MFr Bld: 5.4 % (ref 4.8–5.6)

## 2022-02-05 LAB — VITAMIN D 25 HYDROXY (VIT D DEFICIENCY, FRACTURES): Vit D, 25-Hydroxy: 114 ng/mL — ABNORMAL HIGH (ref 30.0–100.0)

## 2022-02-06 ENCOUNTER — Telehealth: Payer: Self-pay | Admitting: Internal Medicine

## 2022-02-06 NOTE — Telephone Encounter (Signed)
Error

## 2022-02-08 ENCOUNTER — Encounter: Payer: Medicare Other | Admitting: Occupational Therapy

## 2022-02-08 ENCOUNTER — Ambulatory Visit: Payer: Medicare Other | Admitting: Physical Therapy

## 2022-02-08 ENCOUNTER — Ambulatory Visit: Payer: Medicare Other | Admitting: Speech Pathology

## 2022-02-10 ENCOUNTER — Encounter: Payer: Medicare Other | Admitting: Speech Pathology

## 2022-02-10 ENCOUNTER — Ambulatory Visit: Payer: Medicare Other | Admitting: Physical Therapy

## 2022-02-10 ENCOUNTER — Encounter: Payer: Medicare Other | Admitting: Occupational Therapy

## 2022-02-15 ENCOUNTER — Encounter: Payer: Medicare Other | Admitting: Speech Pathology

## 2022-02-15 ENCOUNTER — Encounter: Payer: Medicare Other | Admitting: Occupational Therapy

## 2022-02-15 ENCOUNTER — Ambulatory Visit: Payer: Medicare Other | Admitting: Physical Therapy

## 2022-02-17 ENCOUNTER — Encounter: Payer: Medicare Other | Admitting: Occupational Therapy

## 2022-02-17 ENCOUNTER — Encounter: Payer: Medicare Other | Admitting: Speech Pathology

## 2022-02-17 ENCOUNTER — Other Ambulatory Visit: Payer: Self-pay | Admitting: Internal Medicine

## 2022-02-17 ENCOUNTER — Ambulatory Visit: Payer: Medicare Other | Admitting: Physical Therapy

## 2022-02-17 NOTE — Telephone Encounter (Signed)
Requested Prescriptions  ?Pending Prescriptions Disp Refills  ?? BD PEN NEEDLE MICRO U/F 32G X 6 MM MISC [Pharmacy Med Name: BD UF MICRO PEN NEEDLE 6MMX32G] 100 each 0  ?  Sig: USE DAILY AS DIRECTED  ?  ? Endocrinology: Diabetes - Testing Supplies Passed - 02/17/2022 10:54 AM  ?  ?  Passed - Valid encounter within last 12 months  ?  Recent Outpatient Visits   ?      ? 1 week ago Essential hypertension  ? [facility] Glean Hess, MD  ? 3 months ago Type II diabetes mellitus with complication [facility])  ? [facility] Glean Hess, MD  ? 6 months ago Type II diabetes mellitus with complication [facility])  ? [facility] Glean Hess, MD  ? 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 [facility] Glean Hess, MD  ?  ?  ?Future Appointments   ?        ? In 3 months [place] Melia Jesse Sans, MD [facility] [place]  ?  ? ?  ?  ?  ? ? ?

## 2022-02-22 ENCOUNTER — Encounter: Payer: Medicare Other | Admitting: Speech Pathology

## 2022-02-22 ENCOUNTER — Encounter: Payer: Medicare Other | Admitting: Occupational Therapy

## 2022-02-22 ENCOUNTER — Ambulatory Visit: Payer: Medicare Other | Admitting: Physical Therapy

## 2022-02-24 ENCOUNTER — Ambulatory Visit: Payer: Medicare Other | Admitting: Physical Therapy

## 2022-02-24 ENCOUNTER — Encounter: Payer: Medicare Other | Admitting: Speech Pathology

## 2022-02-24 ENCOUNTER — Encounter: Payer: Medicare Other | Admitting: Occupational Therapy

## 2022-02-28 ENCOUNTER — Encounter: Payer: Self-pay | Admitting: Internal Medicine

## 2022-02-28 ENCOUNTER — Other Ambulatory Visit: Payer: Self-pay | Admitting: Internal Medicine

## 2022-02-28 DIAGNOSIS — E118 Type 2 diabetes mellitus with unspecified complications: Secondary | ICD-10-CM

## 2022-02-28 MED ORDER — BASAGLAR KWIKPEN 100 UNIT/ML ~~LOC~~ SOPN: 14.0000 [IU] | PEN_INJECTOR | Freq: Every day | SUBCUTANEOUS | 0 refills | Status: DC

## 2022-03-01 ENCOUNTER — Ambulatory Visit: Payer: Medicare Other | Admitting: Physical Therapy

## 2022-03-01 ENCOUNTER — Encounter: Payer: Medicare Other | Admitting: Speech Pathology

## 2022-03-01 ENCOUNTER — Encounter: Payer: Medicare Other | Admitting: Occupational Therapy

## 2022-03-03 ENCOUNTER — Ambulatory Visit: Payer: Medicare Other | Admitting: Physical Therapy

## 2022-03-03 ENCOUNTER — Encounter: Payer: Medicare Other | Admitting: Speech Pathology

## 2022-03-03 ENCOUNTER — Encounter: Payer: Medicare Other | Admitting: Occupational Therapy

## 2022-03-08 ENCOUNTER — Encounter: Payer: Medicare Other | Admitting: Speech Pathology

## 2022-03-10 ENCOUNTER — Encounter: Payer: Medicare Other | Admitting: Speech Pathology

## 2022-03-15 ENCOUNTER — Encounter: Payer: Medicare Other | Admitting: Speech Pathology

## 2022-03-17 ENCOUNTER — Encounter: Payer: Medicare Other | Admitting: Speech Pathology

## 2022-03-22 ENCOUNTER — Encounter: Payer: Medicare Other | Admitting: Speech Pathology

## 2022-03-24 ENCOUNTER — Encounter: Payer: Medicare Other | Admitting: Speech Pathology

## 2022-03-29 ENCOUNTER — Encounter: Payer: Medicare Other | Admitting: Speech Pathology

## 2022-03-31 ENCOUNTER — Encounter: Payer: Medicare Other | Admitting: Speech Pathology

## 2022-04-05 ENCOUNTER — Encounter: Payer: Medicare Other | Admitting: Speech Pathology

## 2022-04-07 ENCOUNTER — Encounter: Payer: Medicare Other | Admitting: Speech Pathology

## 2022-04-12 ENCOUNTER — Encounter: Payer: Medicare Other | Admitting: Speech Pathology

## 2022-04-14 ENCOUNTER — Encounter: Payer: Medicare Other | Admitting: Speech Pathology

## 2022-04-19 ENCOUNTER — Encounter: Payer: Medicare Other | Admitting: Speech Pathology

## 2022-04-21 ENCOUNTER — Encounter: Payer: Medicare Other | Admitting: Speech Pathology

## 2022-04-26 ENCOUNTER — Encounter: Payer: Medicare Other | Admitting: Speech Pathology

## 2022-04-28 ENCOUNTER — Encounter: Payer: Medicare Other | Admitting: Speech Pathology

## 2022-05-03 ENCOUNTER — Encounter: Payer: Medicare Other | Admitting: Speech Pathology

## 2022-05-05 ENCOUNTER — Encounter: Payer: Medicare Other | Admitting: Speech Pathology

## 2022-05-22 ENCOUNTER — Other Ambulatory Visit: Payer: Self-pay | Admitting: Internal Medicine

## 2022-05-23 NOTE — Telephone Encounter (Signed)
Requested Prescriptions  Pending Prescriptions Disp Refills  . amLODipine (NORVASC) 5 MG tablet [Pharmacy Med Name: AMLODIPINE BESYLATE 5 MG TAB] 90 tablet 0    Sig: TAKE 1 TABLET (5 MG TOTAL) BY MOUTH DAILY.     Cardiovascular: Calcium Channel Blockers 2 Passed - 05/22/2022  9:30 AM      Passed - Last BP in normal range    BP Readings from Last 1 Encounters:  02/04/22 122/74         Passed - Last Heart Rate in normal range    Pulse Readings from Last 1 Encounters:  02/04/22 95         Passed - Valid encounter within last 6 months    Recent Outpatient Visits          3 months ago Essential hypertension   [facility] Reubin Milan, MD   6 months ago Type II diabetes mellitus with complication [facility])   [facility] Reubin Milan, MD   10 months ago Type II diabetes mellitus with complication [facility])   [facility] Reubin Milan, MD   11 months ago 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

## 2022-06-08 ENCOUNTER — Ambulatory Visit (INDEPENDENT_AMBULATORY_CARE_PROVIDER_SITE_OTHER): Payer: Medicare Other | Admitting: Internal Medicine

## 2022-06-08 ENCOUNTER — Encounter: Payer: Self-pay | Admitting: Internal Medicine

## 2022-06-08 VITALS — BP 120/62 | HR 90 | Ht 68.0 in | Wt 124.0 lb

## 2022-06-08 DIAGNOSIS — I6300 Cerebral infarction due to thrombosis of unspecified precerebral artery: Secondary | ICD-10-CM

## 2022-06-08 DIAGNOSIS — I10 Essential (primary) hypertension: Secondary | ICD-10-CM | POA: Diagnosis not present

## 2022-06-08 DIAGNOSIS — E118 Type 2 diabetes mellitus with unspecified complications: Secondary | ICD-10-CM | POA: Diagnosis not present

## 2022-06-08 LAB — POCT GLYCOSYLATED HEMOGLOBIN (HGB A1C): Hemoglobin A1C: 5.6 % (ref 4.0–5.6)

## 2022-06-08 MED ORDER — METFORMIN HCL 1000 MG PO TABS: 500.0000 mg | ORAL_TABLET | Freq: Two times a day (BID) | ORAL | 0 refills | Status: DC

## 2022-06-08 NOTE — Addendum Note (Signed)
Addended by: Reubin Milan on: 06/08/2022 02:38 PM   Modules accepted: Orders

## 2022-06-08 NOTE — Patient Instructions (Signed)
Take metformin 1000 in the AM  with metformin 500 in the PM the transition to 500 mg twice a day when the 1000 mg is out.

## 2022-06-08 NOTE — Progress Notes (Addendum)
Date:  06/08/2022   Name:  Laura [name]   DOB:  1951/02/04   MRN:  [id number]   Chief Complaint: Diabetes (Last day using insulin was 04/08/2022) and Hypertension  Diabetes She presents for her follow-up diabetic visit. She has type 2 diabetes mellitus. Her disease course has been improving. Pertinent negatives for hypoglycemia include no headaches or tremors. Pertinent negatives for diabetes include no chest pain, no fatigue, no polydipsia and no polyuria. Current diabetic treatment includes oral agent (monotherapy) (metformin - weaned off insulin 2 months ago). She is following a generally healthy and diabetic diet. She participates in exercise daily. Her breakfast blood glucose is taken between 6-7 am. Her breakfast blood glucose range is generally 90-110 mg/dl. An ACE inhibitor/angiotensin II receptor blocker is not being taken. Eye exam is current.  Hypertension This is a chronic problem. The problem is controlled. Pertinent negatives include no chest pain, headaches, palpitations or shortness of breath. Past treatments include calcium channel blockers. The current treatment provides significant improvement.    Lab Results  Component Value Date   NA 143 02/04/2022   K 4.9 02/04/2022   CO2 26 02/04/2022   GLUCOSE 128 (H) 02/04/2022   BUN 17 02/04/2022   CREATININE 0.68 02/04/2022   CALCIUM 10.0 02/04/2022   EGFR 94 02/04/2022   GFRNONAA >60 05/31/2021   Lab Results  Component Value Date   CHOL 79 (L) 07/26/2021   HDL 27 (L) 07/26/2021   LDLCALC 36 07/26/2021   TRIG 77 07/26/2021   CHOLHDL 2.9 07/26/2021   Lab Results  Component Value Date   TSH 3.160 05/23/2021   Lab Results  Component Value Date   HGBA1C 5.4 02/04/2022   Lab Results  Component Value Date   WBC 8.7 02/04/2022   HGB 12.0 02/04/2022   HCT 35.5 02/04/2022   MCV 86 02/04/2022   PLT 386 02/04/2022   Lab Results  Component Value Date   ALT 49 (H) 02/04/2022   AST 31 02/04/2022   ALKPHOS 158 (H)  02/04/2022   BILITOT <0.2 02/04/2022   Lab Results  Component Value Date   VD25OH 114.0 (H) 02/04/2022     Review of Systems  Constitutional:  Positive for unexpected weight change (has lost weight likely due to living alone and doing her own meal prep). Negative for appetite change, fatigue and fever.  HENT:  Negative for tinnitus and trouble swallowing.   Eyes:  Negative for visual disturbance.  Respiratory:  Negative for cough, chest tightness and shortness of breath.   Cardiovascular:  Negative for chest pain, palpitations and leg swelling.  Gastrointestinal:  Negative for abdominal pain.  Endocrine: Negative for polydipsia and polyuria.  Genitourinary:  Negative for dysuria and hematuria.  Musculoskeletal:  Negative for arthralgias.  Neurological:  Negative for tremors, numbness and headaches.  Psychiatric/Behavioral:  Negative for dysphoric mood.     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/02/2021   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 (CVA) 06/02/2021   Vitamin D deficiency 06/02/2021   Type II diabetes mellitus with complication ([place])    Vitamin B12 deficiency    Essential hypertension 05/23/2021    No Known Allergies  Past Surgical History:  Procedure Laterality Date   CATARACT EXTRACTION W/PHACO Right 11/10/2021  Procedure: CATARACT EXTRACTION PHACO AND INTRAOCULAR LENS PLACEMENT (IOC) RIGHT DIABETIC  22.44 02:07.2;  Surgeon: Leandrew Koyanagi, MD;  Location: [facility];  Service: Ophthalmology;  Laterality: Right;   CATARACT EXTRACTION W/PHACO Left 12/01/2021   Procedure: CATARACT EXTRACTION PHACO AND INTRAOCULAR LENS PLACEMENT (IOC) LEFT DIABETIC 3.10 00:41.8;  Surgeon: Leandrew Koyanagi, MD;  Location: [facility];  Service: Ophthalmology;  Laterality: Left;   FOOT SURGERY     FRACTURE SURGERY  ?   Foot fracture, no surgery   TONSILLECTOMY      Social History   Tobacco Use   Smoking status: Never    Passive exposure: Never   Smokeless tobacco: Never  Vaping Use   Vaping Use: Never used  Substance Use Topics   Alcohol use: Never   Drug use: Never     Medication list has been reviewed and updated.  Current Meds  Medication Sig   Accu-Chek Softclix Lancets lancets Use twice a day   amLODipine (NORVASC) 5 MG tablet TAKE 1 TABLET (5 MG TOTAL) BY MOUTH DAILY.   aspirin 81 MG EC tablet Take 1 tablet (81 mg total) by mouth daily. Swallow whole.   atorvastatin (LIPITOR) 80 MG tablet TAKE 1 TABLET BY MOUTH EVERY EVENING   cyanocobalamin 1000 MCG tablet Take 1 tablet (1,000 mcg total) by mouth daily.   glucose blood (ACCU-CHEK GUIDE) test strip Test blood sugar twice a day   metFORMIN (GLUCOPHAGE) 1000 MG tablet Take 1 tablet (1,000 mg total) by mouth 2 (two) times daily with a meal.       06/08/2022    1:51 PM 02/04/2022    1:42 PM 10/27/2021    1:43 PM 07/26/2021    2:32 PM  GAD 7 : Generalized Anxiety Score  Nervous, Anxious, on Edge 0 0 0 0  Control/stop worrying 0 0 0 0  Worry too much - different things 0 0 0 0  Trouble relaxing 0 0 0 0  Restless 0 0 0 0  Easily annoyed or irritable 0 0 0 0  Afraid - awful might happen 0 0 0 0  Total GAD 7 Score 0 0 0 0  Anxiety Difficulty Not difficult at all Not difficult at all Not difficult at all        06/08/2022    1:51 PM 02/04/2022    1:42 PM 12/20/2021    8:56 AM  Depression screen PHQ 2/9  Decreased Interest 0 0 0  Down, Depressed, Hopeless 0 0 0  PHQ - 2 Score 0 0 0  Altered sleeping 0 0   Tired, decreased energy 0 0   Change in appetite 0 0   Feeling bad or failure about yourself  0 0   Trouble concentrating 0 0   Moving slowly or fidgety/restless 0 0   Suicidal thoughts 0 0   PHQ-9  Score 0 0   Difficult doing work/chores Not difficult at all Not difficult at all     BP Readings from Last 3 Encounters:  06/08/22 120/62  02/04/22 122/74  12/20/21 120/70    Physical Exam Vitals and nursing note reviewed.  Constitutional:      General: She is not in acute distress.    Appearance: She is well-developed.  HENT:     Head: Normocephalic and atraumatic.  Cardiovascular:     Rate and Rhythm: Normal rate and regular rhythm.     Pulses: Normal pulses.  Pulmonary:     Effort: Pulmonary effort is normal. No  respiratory distress.     Breath sounds: No wheezing or rhonchi.  Abdominal:     General: Abdomen is flat.     Palpations: Abdomen is soft. There is no mass.     Tenderness: There is no abdominal tenderness. There is no guarding.  Musculoskeletal:     Cervical back: Normal range of motion.     Right lower leg: No edema.     Left lower leg: No edema.  Lymphadenopathy:     Cervical: No cervical adenopathy.  Skin:    General: Skin is warm and dry.     Findings: No rash.  Neurological:     Mental Status: She is alert and oriented to person, place, and time. Mental status is at baseline.     Comments: Mild speech impairment/slowing  Psychiatric:        Mood and Affect: Mood normal.        Behavior: Behavior normal.     Wt Readings from Last 3 Encounters:  06/08/22 124 lb (56.2 kg)  02/04/22 131 lb (59.4 kg)  12/20/21 133 lb 6.4 oz (60.5 kg)    BP 120/62   Pulse 90   Ht 5' 8"  (1.727 m)   Wt 124 lb (56.2 kg)   SpO2 98%   BMI 18.85 kg/m   Assessment and Plan: 1. Essential hypertension Clinically stable exam with well controlled BP. Tolerating medications without side effects at this time. Pt to continue current regimen and low sodium diet; benefits of regular exercise as able discussed.  2. Type II diabetes mellitus with complication ([place]) Clinically stable by exam and report without s/s of hypoglycemia. DM complicated by hypertension and  dyslipidemia. Now off insulin; will reduce metformin to 1000 AM and 500 PM then 500 bid Tolerating medications well without side effects or other concerns. Try to incorporate more protein into her diet/snacks to prevent further weight loss - POCT glycosylated hemoglobin (Hb A1C)= 5.6 - Microalbumin / creatinine urine ratio   Partially dictated using Editor, commissioning. Any errors are unintentional.  Halina Maidens, MD [facility]  06/08/2022

## 2022-06-09 LAB — MICROALBUMIN / CREATININE URINE RATIO
Creatinine, Urine: 57 mg/dL
Microalb/Creat Ratio: 10 mg/g creat (ref 0–29)
Microalbumin, Urine: 5.8 ug/mL

## 2022-06-23 ENCOUNTER — Ambulatory Visit: Payer: Medicare Other | Admitting: Physical Medicine and Rehabilitation

## 2022-07-04 LAB — HM DIABETES EYE EXAM

## 2022-07-20 ENCOUNTER — Encounter: Payer: Self-pay | Admitting: Internal Medicine

## 2022-07-24 ENCOUNTER — Other Ambulatory Visit: Payer: Self-pay | Admitting: Internal Medicine

## 2022-07-24 DIAGNOSIS — E118 Type 2 diabetes mellitus with unspecified complications: Secondary | ICD-10-CM

## 2022-07-26 NOTE — Telephone Encounter (Signed)
Requested medication (s) are due for refill today - expired Rx  Requested medication (s) are on the active medication list -yes  Future visit scheduled -yes  Last refill: 07/05/21 #100 [id number]  Notes to clinic: expired Rx  Requested Prescriptions  Pending Prescriptions Disp Refills   glucose blood (ACCU-CHEK GUIDE) test strip [Pharmacy Med Name: ACCU-CHEK GUIDE TEST STRIP] 100 strip 7    Sig: USE TO TEST BLOOD SUGAR TWICE A DAY     Endocrinology: Diabetes - Testing Supplies Passed - 07/24/2022  9:40 AM      Passed - Valid encounter within last 12 months    Recent Outpatient Visits           1 month ago Essential hypertension   [place] [facility] at [facility], Nyoka Cowden, MD   5 months ago Essential hypertension   [place] [facility] at [facility], Nyoka Cowden, MD   9 months ago Type II diabetes mellitus with complication [facility])   [place] [facility] at [facility], Nyoka Cowden, MD   1 year ago Type II diabetes mellitus with complication [facility])   [place] [facility] at [facility], Nyoka Cowden, MD   1 year ago 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)   [place] [facility] at [facility], Nyoka Cowden, MD       Future Appointments             In 2 months Judithann Graves Nyoka Cowden, MD [facility] at [facility], [facility]               Requested Prescriptions  Pending Prescriptions Disp Refills   glucose blood (ACCU-CHEK GUIDE) test strip [Pharmacy Med Name: ACCU-CHEK GUIDE TEST STRIP] 100 strip 7    Sig: USE TO TEST BLOOD SUGAR TWICE A DAY     Endocrinology: Diabetes - Testing Supplies Passed - 07/24/2022  9:40 AM      Passed - Valid encounter within last 12 months    Recent Outpatient Visits           1 month ago Essential hypertension   [place]  [facility] at [facility], Nyoka Cowden, MD   5 months ago Essential hypertension   [place] [facility] at [facility], Nyoka Cowden, MD   9 months ago Type II diabetes mellitus with complication [facility])   [place] [facility] at [facility], Nyoka Cowden, MD   1 year ago Type II diabetes mellitus with complication [facility])   [place] [facility] at [facility], Nyoka Cowden, MD   1 year ago 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

## 2022-08-15 ENCOUNTER — Ambulatory Visit: Payer: Medicare Other

## 2022-08-19 ENCOUNTER — Ambulatory Visit (INDEPENDENT_AMBULATORY_CARE_PROVIDER_SITE_OTHER): Payer: Medicare Other

## 2022-08-19 VITALS — BP 118/72 | HR 88 | Temp 97.8°F | Resp 17 | Ht 67.72 in | Wt 128.4 lb

## 2022-08-19 DIAGNOSIS — Z23 Encounter for immunization: Secondary | ICD-10-CM | POA: Diagnosis not present

## 2022-08-19 DIAGNOSIS — Z0000 Encounter for general adult medical examination without abnormal findings: Secondary | ICD-10-CM | POA: Diagnosis not present

## 2022-08-19 NOTE — Patient Instructions (Signed)

## 2022-08-19 NOTE — Progress Notes (Signed)
Subjective:   Laura [name] is a 71 y.o. female who presents for Medicare Annual (Subsequent) preventive examination.  Review of Systems    Per HPI unless specifically indicated below.  Cardiac Risk Factors include: advanced age (>86men, >42 women);female gender, essential hypertension, Type 2 diabetes mellitus, and hyperlipidemia.           Objective:    Today's Vitals   08/19/22 1413 08/19/22 1427  BP: 118/72   Pulse: 88   Resp: 17   Temp: 97.8 F (36.6 C)   TempSrc: Oral   SpO2: 98%   Weight: 128 lb 6.4 oz (58.2 kg)   Height: 5' 7.72" (1.72 m)   PainSc: 0-No pain 0-No pain   Body mass index is 19.69 kg/m.     12/01/2021    8:02 AM 11/10/2021    6:40 AM 08/19/2021    3:16 PM 08/11/2021    3:34 PM 07/06/2021    1:15 PM 07/05/2021    9:13 AM 05/26/2021    2:09 PM  Advanced Directives  Does Patient Have a Medical Advance Directive? No No Yes Yes No No No  Type of Advance Directive   Healthcare Power of [place] in Chart?   No - copy requested No - copy requested     Would patient like information on creating a medical advance directive? No - Patient declined No - Patient declined    Yes (Inpatient - patient requests chaplain consult to create a medical advance directive);No - Patient declined Yes (Inpatient - patient requests chaplain consult to create a medical advance directive)    Current Medications (verified) Outpatient Encounter Medications as of 08/19/2022  Medication Sig   Accu-Chek Softclix Lancets lancets Use twice a day   amLODipine (NORVASC) 5 MG tablet TAKE 1 TABLET (5 MG TOTAL) BY MOUTH DAILY.   aspirin 81 MG EC tablet Take 1 tablet (81 mg total) by mouth daily. Swallow whole.   atorvastatin (LIPITOR) 80 MG tablet TAKE 1 TABLET BY MOUTH EVERY EVENING   cyanocobalamin 1000 MCG tablet Take 1 tablet (1,000 mcg total) by mouth daily.   glucose blood (ACCU-CHEK GUIDE) test strip USE TO TEST  BLOOD SUGAR TWICE A DAY   metFORMIN (GLUCOPHAGE) 500 MG tablet Take 500 mg by mouth 2 (two) times daily with a meal.   [DISCONTINUED] metFORMIN (GLUCOPHAGE) 1000 MG tablet Take 0.5 tablets (500 mg total) by mouth 2 (two) times daily with a meal.   No facility-administered encounter medications on file as of 08/19/2022.    Allergies (verified) Patient has no known allergies.   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   Stroke ([place]) 05/23/2021   Past Surgical History:  Procedure Laterality Date   CATARACT EXTRACTION W/PHACO Right 11/10/2021   Procedure: CATARACT EXTRACTION PHACO AND INTRAOCULAR LENS PLACEMENT (IOC) RIGHT DIABETIC  22.44 02:07.2;  Surgeon: Leandrew Koyanagi, MD;  Location: [place];  Service: Ophthalmology;  Laterality: Right;   CATARACT EXTRACTION W/PHACO Left 12/01/2021   Procedure: CATARACT EXTRACTION PHACO AND INTRAOCULAR LENS PLACEMENT (IOC) LEFT DIABETIC 3.10 00:41.8;  Surgeon: Leandrew Koyanagi, MD;  Location: [place];  Service: Ophthalmology;  Laterality: Left;   FOOT SURGERY  FRACTURE SURGERY  ?   Foot fracture, no surgery   TONSILLECTOMY     Family History  Problem Relation Age of Onset   Hypertension Mother    Diabetes Mother    Arthritis Mother    Heart disease Mother    Heart failure Father    Stroke Father    COPD Father    Hypertension Sister    Arthritis Sister    Heart disease Maternal Grandfather    Heart disease Maternal Grandmother    Stroke Paternal Grandfather    Stroke Paternal Grandmother    Arthritis Sister    Hypertension Sister    Cancer Maternal Aunt    Diabetes Maternal Aunt    Cancer Paternal Aunt    Hypertension Sister     Varicose Veins Sister    Social History   Socioeconomic History   Marital status: Single    Spouse name: Not on file   Number of children: 0   Years of education: Not on file   Highest education level: Not on file  Occupational History   Occupation: Retired  Tobacco Use   Smoking status: Never    Passive exposure: Never   Smokeless tobacco: Never  Vaping Use   Vaping Use: Never used  Substance and Sexual Activity   Alcohol use: Never   Drug use: Never   Sexual activity: Not Currently    Birth control/protection: None  Other Topics Concern   Not on file  Social History Narrative   Not on file   Social Determinants of Health   Financial Resource Strain: Low Risk  (08/19/2022)   Overall Financial Resource Strain (CARDIA)    Difficulty of Paying Living Expenses: Not hard at all  Food Insecurity: No Food Insecurity (08/19/2022)   Hunger Vital Sign    Worried About Running Out of Food in the Last Year: Never true    Ran Out of Food in the Last Year: Never true  Transportation Needs: No Transportation Needs (08/19/2022)   PRAPARE - Administrator, Civil Service (Medical): No    Lack of Transportation (Non-Medical): No  Physical Activity: Insufficiently Active (08/19/2022)   Exercise Vital Sign    Days of Exercise per Week: 5 days    Minutes of Exercise per Session: 10 min  Stress: No Stress Concern Present (08/19/2022)   [employer] of Occupational Health - Occupational Stress Questionnaire    Feeling of Stress : Not at all  Social Connections: Socially Isolated (08/19/2022)   Social Connection and Isolation Panel [NHANES]    Frequency of Communication with Friends and Family: Twice a week    Frequency of Social Gatherings with Friends and Family: Twice a week    Attends Religious Services: Never    Database administrator or Organizations: No    Attends Engineer, structural: Never    Marital Status: Never married    Tobacco Counseling Counseling  given: Not Answered   Clinical Intake:  Pre-visit preparation completed: No  Pain : No/denies pain Pain Score: 0-No pain     Nutritional Status: BMI of 19-24  Normal Nutritional Risks: Unintentional weight loss Diabetes: Yes CBG done?: Yes CBG resulted in Enter/ Edit results?: Yes Did pt. bring in CBG monitor from home?: No  How often do you need to have someone help you when you read instructions, pamphlets, or other written materials from your doctor or pharmacy?: 1 - Never  Diabetic?Nutrition Risk Assessment:  Has the patient had any N/V/D  within the last 2 months?  No  Does the patient have any non-healing wounds?  No  Has the patient had any unintentional weight loss or weight gain?  Yes   Diabetes:  Is the patient diabetic?  Yes  If diabetic, was a CBG obtained today?  Yes  Did the patient bring in their glucometer from home?  No  How often do you monitor your CBG's? Once daily .   Financial Strains and Diabetes Management:  Are you having any financial strains with the device, your supplies or your medication? No .  Does the patient want to be seen by Chronic Care Management for management of their diabetes?  No  Would the patient like to be referred to a Nutritionist or for Diabetic Management?  No   Diabetic Exams:  Diabetic Eye Exam: Completed 07/04/22 Diabetic Foot Exam: Completed 07/26/2021    Interpreter Needed?: No  Information entered by :: Donnie Mesa, CMA   Activities of Daily Living    08/19/2022    2:22 PM 02/04/2022    1:42 PM  In your present state of health, do you have any difficulty performing the following activities:  Hearing? 1 0  Vision? 1 0  Difficulty concentrating or making decisions? 0 0  Walking or climbing stairs? 1 0  Dressing or bathing? 0 0  Doing errands, shopping? 0 0    Patient Care Team: Glean Hess, MD as PCP - General (Internal Medicine) Leandrew Koyanagi, MD as Referring Physician  (Ophthalmology) Melida Quitter, MD as Consulting Physician (Otolaryngology)  Indicate any recent Medical Services you may have received from other than [facility] providers in the past year (date may be approximate).    No hospitalization the past 12 months.  Assessment:   This is a routine wellness examination for Laura [name].  Hearing/Vision screen Bilateral sensorineural hearing loss followed by [facility]. The patient wear hearing aids.  Annual Eye Exam: [facility] , Dr. Wallace Going.  07/04/2022 . Wear glasses.   Dietary issues and exercise activities discussed:     Goals Addressed             This Visit's Progress    Stay Active and Independent          Why is this important?   Regular activity or exercise is important to managing back pain.  Activity helps to keep your muscles strong.  You will sleep better and feel more relaxed.  You will have more energy and feel less stressed.  If you are not active now, start slowly. Little changes make a big difference.  Rest, but not too much.  Stay as active as you can and listen to your body's signals.            Depression Screen    08/19/2022    2:20 PM 06/08/2022    1:51 PM 02/04/2022    1:42 PM 12/20/2021    8:56 AM 10/27/2021    1:43 PM 09/09/2021   10:18 AM 09/09/2021   10:17 AM  PHQ 2/9 Scores  PHQ - 2 Score 0 0 0 0 0 0 0  PHQ- 9 Score  0 0  0 3     Fall Risk    08/19/2022    2:22 PM 06/08/2022    1:51 PM 02/04/2022    1:42 PM 12/20/2021    8:55 AM 10/27/2021    1:43 PM  Fall Risk   Falls in the past year? 0 0 0 0 0  Number falls in past yr: 0 0 0  0  Injury with Fall? 0 0 0  0  Risk for fall due to : No Fall Risks No Fall Risks No Fall Risks  No Fall Risks  Follow up Falls evaluation completed Falls evaluation completed Falls evaluation completed  Falls evaluation completed    [place]:  Any stairs in or around the home? No  If so, are there any without  handrails? No  Home free of loose throw rugs in walkways, pet beds, electrical cords, etc? Yes  Adequate lighting in your home to reduce risk of falls? Yes   ASSISTIVE DEVICES UTILIZED TO PREVENT FALLS:  Life alert? No  Use of a cane, walker or w/c? Yes  Grab bars in the bathroom? Yes  Shower chair or bench in shower? Yes  Elevated toilet seat or a handicapped toilet? No   TIMED UP AND GO:  Was the test performed? Yes .  Length of time to ambulate 10 feet: 10 sec.   Gait steady and fast without use of assistive device  Cognitive Function:        08/19/2022    2:25 PM  6CIT Screen  What Year? 0 points  What month? 0 points  What time? 0 points  Count back from 20 0 points  Months in reverse 0 points  Repeat phrase 2 points  Total Score 2 points    Immunizations Immunization History  Administered Date(s) Administered   Influenza-Unspecified 10/09/2021   PFIZER Comirnaty(Gray Top)Covid-19 Tri-Sucrose Vaccine 09/23/2021, 10/14/2021, 12/18/2021   PNEUMOCOCCAL CONJUGATE-20 07/26/2021    TDAP status: Due, Education has been provided regarding the importance of this vaccine. Advised may receive this vaccine at local pharmacy or Health Dept. Aware to provide a copy of the vaccination record if obtained from local pharmacy or Health Dept. Verbalized acceptance and understanding.  Flu Vaccine status: Completed at today's visit  Pneumococcal vaccine status: Completed during today's visit.  Covid-19 vaccine status: Completed vaccines  Qualifies for Shingles Vaccine? Yes   Zostavax completed No   Shingrix Completed?: No.    Education has been provided regarding the importance of this vaccine. Patient has been advised to call insurance company to determine out of pocket expense if they have not yet received this vaccine. Advised may also receive vaccine at local pharmacy or Health Dept. Verbalized acceptance and understanding.  Screening Tests Health Maintenance  Topic Date  Due   TETANUS/TDAP  Never done   COLONOSCOPY (Pts 45-47yrs Insurance coverage will need to be confirmed)  Never done   Zoster Vaccines- Shingrix (1 of 2) Never done   COVID-19 Vaccine (4 - Pfizer series) 02/12/2022   INFLUENZA VACCINE  07/05/2022   FOOT EXAM  07/26/2022   HEMOGLOBIN A1C  12/09/2022   Diabetic kidney evaluation - GFR measurement  02/05/2023   Diabetic kidney evaluation - Urine ACR  06/09/2023   OPHTHALMOLOGY EXAM  07/05/2023   Pneumonia Vaccine 56+ Years old  Completed   DEXA SCAN  Completed   Hepatitis C Screening  Completed   HPV VACCINES  Aged Out   MAMMOGRAM  Discontinued    Health Maintenance  Health Maintenance Due  Topic Date Due   TETANUS/TDAP  Never done   COLONOSCOPY (Pts 45-65yrs Insurance coverage will need to be confirmed)  Never done   Zoster Vaccines- Shingrix (1 of 2) Never done   COVID-19 Vaccine (4 - Pfizer series) 02/12/2022   INFLUENZA VACCINE  07/05/2022  FOOT EXAM  07/26/2022    Colorectal Screening:   Mammogram status: No longer required due to discontinued .  DEXA Screening : Completed 09/02/2021   Lung Cancer Screening: (Low Dose CT Chest recommended if Age 24-80 years, 30 pack-year currently smoking OR have quit w/in 15years.) does not qualify.    Additional Screening:  Hepatitis C Screening: does qualify; Completed 07/26/2021  Vision Screening: Recommended annual ophthalmology exams for early detection of glaucoma and other disorders of the eye. Is the patient up to date with their annual eye exam?  Yes  Who is the provider or what is the name of the office in which the patient attends annual eye exams? [facility] If pt is not established with a provider, would they like to be referred to a provider to establish care? No .   Dental Screening: Recommended annual dental exams for proper oral hygiene  Community Resource Referral / Chronic Care Management: CRR required this visit?  No   CCM required this visit?  No       Plan:     I have personally reviewed and noted the following in the patient's chart:   Medical and social history Use of alcohol, tobacco or illicit drugs  Current medications and supplements including opioid prescriptions. Patient is not currently taking opioid prescriptions. Functional ability and status Nutritional status Physical activity Advanced directives List of other physicians Hospitalizations, surgeries, and ER visits in previous 12 months Vitals Screenings to include cognitive, depression, and falls Referrals and appointments  In addition, I have reviewed and discussed with patient certain preventive protocols, quality metrics, and best practice recommendations. A written personalized care plan for preventive services as well as general preventive health recommendations were provided to patient.    Laura [name] , Thank you for taking time to come for your Medicare Wellness Visit. I appreciate your ongoing commitment to your health goals. Please review the following plan we discussed and let me know if I can assist you in the future.   These are the goals we discussed:  Goals      Stay Active and Independent        Why is this important?   Regular activity or exercise is important to managing back pain.  Activity helps to keep your muscles strong.  You will sleep better and feel more relaxed.  You will have more energy and feel less stressed.  If you are not active now, start slowly. Little changes make a big difference.  Rest, but not too much.  Stay as active as you can and listen to your body's signals.             This is a list of the screening recommended for you and due dates:  Health Maintenance  Topic Date Due   Tetanus Vaccine  Never done   Colon Cancer Screening  Never done   Zoster (Shingles) Vaccine (1 of 2) Never done   COVID-19 Vaccine (4 - Pfizer series) 02/12/2022   Flu Shot  07/05/2022   Complete foot exam   07/26/2022    Hemoglobin A1C  12/09/2022   Yearly kidney function blood test for diabetes  02/05/2023   Yearly kidney health urinalysis for diabetes  06/09/2023   Eye exam for diabetics  07/05/2023   Pneumonia Vaccine  Completed   DEXA scan (bone density measurement)  Completed   Hepatitis C Screening: USPSTF Recommendation to screen - Ages 75-79 yo.  Completed   HPV Vaccine  Aged  Out   Mammogram  Discontinued      Lonna Cobb, [facility]   08/19/2022   Nurse Notes: Approximately 30 minute Face -to Face Visit

## 2022-08-20 ENCOUNTER — Other Ambulatory Visit: Payer: Self-pay | Admitting: Internal Medicine

## 2022-08-22 NOTE — Telephone Encounter (Signed)
Requested Prescriptions  Pending Prescriptions Disp Refills  . amLODipine (NORVASC) 5 MG tablet [Pharmacy Med Name: AMLODIPINE BESYLATE 5 MG TAB] 90 tablet 0    Sig: TAKE 1 TABLET (5 MG TOTAL) BY MOUTH DAILY.     Cardiovascular: Calcium Channel Blockers 2 Passed - 08/20/2022 11:13 AM      Passed - Last BP in normal range    BP Readings from Last 1 Encounters:  08/19/22 118/72         Passed - Last Heart Rate in normal range    Pulse Readings from Last 1 Encounters:  08/19/22 88         Passed - Valid encounter within last 6 months    Recent Outpatient Visits          2 months ago Essential hypertension   [place] [facility] at 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 atorvastatin (LIPITOR) 80 MG tablet [Pharmacy Med Name: ATORVASTATIN 80 MG TABLET] 90 tablet 0    Sig: TAKE 1 TABLET BY MOUTH EVERY DAY IN THE EVENING     Cardiovascular:  Antilipid - Statins Failed - 08/20/2022 11:13 AM      Failed - Lipid Panel in normal range within the last 12 months    Cholesterol, Total  Date Value Ref Range Status  07/26/2021 79 (L) 100 - 199 mg/dL Final   LDL Chol Calc (NIH)  Date Value Ref Range Status  07/26/2021 36 0 - 99  mg/dL Final   HDL  Date Value Ref Range Status  07/26/2021 27 (L) >39 mg/dL Final   Triglycerides  Date Value Ref Range Status  07/26/2021 77 0 - 149 mg/dL Final         Passed - Patient is not pregnant      Passed - Valid encounter within last 12 months    Recent Outpatient Visits          2 months ago Essential hypertension   [place] [facility] at [facility], Jesse Sans, MD   6 months ago Essential hypertension   [place] [facility] at 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, [facility]

## 2022-09-10 ENCOUNTER — Encounter: Payer: Self-pay | Admitting: Internal Medicine

## 2022-10-10 ENCOUNTER — Encounter: Payer: Self-pay | Admitting: Internal Medicine

## 2022-10-10 ENCOUNTER — Ambulatory Visit (INDEPENDENT_AMBULATORY_CARE_PROVIDER_SITE_OTHER): Payer: Medicare Other | Admitting: Internal Medicine

## 2022-10-10 VITALS — BP 120/64 | HR 97 | Ht 68.0 in | Wt 129.0 lb

## 2022-10-10 DIAGNOSIS — I69998 Other sequelae following unspecified cerebrovascular disease: Secondary | ICD-10-CM

## 2022-10-10 DIAGNOSIS — I6300 Cerebral infarction due to thrombosis of unspecified precerebral artery: Secondary | ICD-10-CM | POA: Diagnosis not present

## 2022-10-10 DIAGNOSIS — I10 Essential (primary) hypertension: Secondary | ICD-10-CM

## 2022-10-10 DIAGNOSIS — R531 Weakness: Secondary | ICD-10-CM

## 2022-10-10 DIAGNOSIS — E118 Type 2 diabetes mellitus with unspecified complications: Secondary | ICD-10-CM | POA: Diagnosis not present

## 2022-10-10 DIAGNOSIS — E785 Hyperlipidemia, unspecified: Secondary | ICD-10-CM

## 2022-10-10 DIAGNOSIS — E1169 Type 2 diabetes mellitus with other specified complication: Secondary | ICD-10-CM

## 2022-10-10 MED ORDER — METFORMIN HCL 500 MG PO TABS: 500.0000 mg | ORAL_TABLET | Freq: Two times a day (BID) | ORAL | 1 refills | Status: DC

## 2022-10-10 NOTE — Progress Notes (Signed)
Date:  10/10/2022   Name:  Laura [name]   DOB:  Nov 10, 1951   MRN:  [id number]   Chief Complaint: Diabetes and Hypertension  Hypertension This is a chronic problem. The problem is controlled. Pertinent negatives include no chest pain, headaches, palpitations or shortness of breath. Past treatments include calcium channel blockers. The current treatment provides significant improvement. Hypertensive end-organ damage includes CVA. There is no history of kidney disease or CAD/MI.  Diabetes She presents for her follow-up diabetic visit. She has type 2 diabetes mellitus. Her disease course has been improving. Pertinent negatives for hypoglycemia include no headaches, nervousness/anxiousness or tremors. Pertinent negatives for diabetes include no chest pain, no fatigue, no polydipsia and no polyuria. Diabetic complications include a CVA. Current diabetic treatment includes oral agent (monotherapy) (metformin 500 mg bid). An ACE inhibitor/angiotensin II receptor blocker is not being taken. Eye exam is current.  Hyperlipidemia This is a chronic problem. Pertinent negatives include no chest pain or shortness of breath. Current antihyperlipidemic treatment includes statins. The current treatment provides moderate improvement of lipids.    Lab Results  Component Value Date   NA 143 02/04/2022   K 4.9 02/04/2022   CO2 26 02/04/2022   GLUCOSE 128 (H) 02/04/2022   BUN 17 02/04/2022   CREATININE 0.68 02/04/2022   CALCIUM 10.0 02/04/2022   EGFR 94 02/04/2022   GFRNONAA >60 05/31/2021   Lab Results  Component Value Date   CHOL 79 (L) 07/26/2021   HDL 27 (L) 07/26/2021   LDLCALC 36 07/26/2021   TRIG 77 07/26/2021   CHOLHDL 2.9 07/26/2021   Lab Results  Component Value Date   TSH 3.160 05/23/2021   Lab Results  Component Value Date   HGBA1C 5.6 06/08/2022   Lab Results  Component Value Date   WBC 8.7 02/04/2022   HGB 12.0 02/04/2022   HCT 35.5 02/04/2022   MCV 86 02/04/2022   PLT 386  02/04/2022   Lab Results  Component Value Date   ALT 49 (H) 02/04/2022   AST 31 02/04/2022   ALKPHOS 158 (H) 02/04/2022   BILITOT <0.2 02/04/2022   Lab Results  Component Value Date   VD25OH 114.0 (H) 02/04/2022     Review of Systems  Constitutional:  Negative for appetite change, fatigue, fever and unexpected weight change.  HENT:  Negative for tinnitus and trouble swallowing.   Eyes:  Negative for visual disturbance.  Respiratory:  Negative for cough, chest tightness and shortness of breath.   Cardiovascular:  Negative for chest pain, palpitations and leg swelling.  Gastrointestinal:  Negative for abdominal pain.  Endocrine: Negative for polydipsia and polyuria.  Genitourinary:  Negative for dysuria and hematuria.  Musculoskeletal:  Negative for arthralgias.  Neurological:  Negative for tremors, numbness and headaches.  Psychiatric/Behavioral:  Negative for dysphoric mood and sleep disturbance. The patient is not nervous/anxious.     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) 06/02/2021   Vitamin D deficiency 06/02/2021   Type II diabetes mellitus with complication ([place])    Vitamin B12 deficiency    Essential hypertension 05/23/2021    No Known Allergies  Past Surgical History:  Procedure Laterality Date   CATARACT EXTRACTION W/PHACO Right 11/10/2021  Procedure: CATARACT EXTRACTION PHACO AND INTRAOCULAR LENS PLACEMENT (IOC) RIGHT DIABETIC  22.44 02:07.2;  Surgeon: Leandrew Koyanagi, MD;  Location: [place];  Service: Ophthalmology;  Laterality: Right;   CATARACT EXTRACTION W/PHACO Left 12/01/2021   Procedure: CATARACT EXTRACTION PHACO AND  INTRAOCULAR LENS PLACEMENT (IOC) LEFT DIABETIC 3.10 00:41.8;  Surgeon: Leandrew Koyanagi, MD;  Location: [place];  Service: Ophthalmology;  Laterality: Left;   FOOT SURGERY     FRACTURE SURGERY  ?   Foot fracture, no surgery   TONSILLECTOMY      Social History   Tobacco Use   Smoking status: Never    Passive exposure: Never   Smokeless tobacco: Never  Vaping Use   Vaping Use: Never used  Substance Use Topics   Alcohol use: Never   Drug use: Never     Medication list has been reviewed and updated.  Current Meds  Medication Sig   Accu-Chek Softclix Lancets lancets Use twice a day   amLODipine (NORVASC) 5 MG tablet TAKE 1 TABLET (5 MG TOTAL) BY MOUTH DAILY.   aspirin 81 MG EC tablet Take 1 tablet (81 mg total) by mouth daily. Swallow whole.   atorvastatin (LIPITOR) 80 MG tablet TAKE 1 TABLET BY MOUTH EVERY DAY IN THE EVENING   Cholecalciferol (VITAMIN D-3 PO) Take by mouth daily.   cyanocobalamin 1000 MCG tablet Take 1 tablet (1,000 mcg total) by mouth daily.   glucose blood (ACCU-CHEK GUIDE) test strip USE TO TEST BLOOD SUGAR TWICE A DAY   [DISCONTINUED] metFORMIN (GLUCOPHAGE) 500 MG tablet Take 500 mg by mouth 2 (two) times daily with a meal.       10/10/2022    2:42 PM 06/08/2022    1:51 PM 02/04/2022    1:42 PM 10/27/2021    1:43 PM  GAD 7 : Generalized Anxiety Score  Nervous, Anxious, on Edge 0 0 0 0  Control/stop worrying 0 0 0 0  Worry too much - different things 0 0 0 0  Trouble relaxing 0 0 0 0  Restless 0 0 0 0  Easily annoyed or irritable 0 0 0 0  Afraid - awful might happen 0 0 0 0  Total GAD 7 Score 0 0 0 0  Anxiety Difficulty Not difficult at all Not difficult at all Not difficult at all Not difficult at all       10/10/2022    2:41 PM 08/19/2022    2:20 PM 06/08/2022    1:51 PM  Depression screen PHQ 2/9  Decreased Interest 0 0 0  Down, Depressed, Hopeless 0 0 0  PHQ - 2 Score 0 0 0  Altered sleeping 0  0  Tired, decreased energy 0  0   Change in appetite 0  0  Feeling bad or failure about yourself  0  0  Trouble concentrating 0  0  Moving slowly or fidgety/restless 0  0  Suicidal thoughts 0  0  PHQ-9 Score 0  0  Difficult doing work/chores Not difficult at all  Not difficult at all    BP Readings from Last 3 Encounters:  10/10/22 120/64  08/19/22 118/72  06/08/22 120/62    Physical Exam Vitals and nursing note reviewed.  Constitutional:      General: She is not in acute distress.    Appearance: Normal appearance. She is well-developed.  HENT:     Head: Normocephalic and atraumatic.  Neck:     Vascular: No carotid bruit.  Cardiovascular:  Rate and Rhythm: Normal rate and regular rhythm.  Pulmonary:     Effort: Pulmonary effort is normal. No respiratory distress.     Breath sounds: No wheezing or rhonchi.  Musculoskeletal:     Cervical back: Normal range of motion.     Right lower leg: No edema.     Left lower leg: No edema.  Lymphadenopathy:     Cervical: No cervical adenopathy.  Skin:    General: Skin is warm and dry.     Findings: No rash.  Neurological:     Mental Status: She is alert and oriented to person, place, and time. Mental status is at baseline.  Psychiatric:        Mood and Affect: Mood normal.        Behavior: Behavior normal.    Diabetic Foot Exam - Simple   Simple Foot Form Diabetic Foot exam was performed with the following findings: Yes 10/10/2022  2:52 PM  Visual Inspection No deformities, no ulcerations, no other skin breakdown bilaterally: Yes Sensation Testing Intact to touch and monofilament testing bilaterally: Yes Pulse Check Posterior Tibialis and Dorsalis pulse intact bilaterally: Yes Comments       Wt Readings from Last 3 Encounters:  10/10/22 129 lb (58.5 kg)  08/19/22 128 lb 6.4 oz (58.2 kg)  06/08/22 124 lb (56.2 kg)    BP 120/64   Pulse 97   Ht _0  (1.727 m)   Wt 129 lb (58.5 kg)   SpO2 97%   BMI 19.61 kg/m   Assessment and Plan: 1.  Essential hypertension Clinically stable exam with well controlled BP. Tolerating medications without side effects at this time. Pt to continue current regimen and low sodium diet; benefits of regular exercise as able discussed.  2. Type II diabetes mellitus with complication ([place]) Clinically stable by exam and report without s/s of hypoglycemia. DM complicated by hypertension and dyslipidemia. Tolerating medications well without side effects or other concerns. - Comprehensive metabolic panel - Hemoglobin A1c - metFORMIN (GLUCOPHAGE) 500 MG tablet; Take 1 tablet (500 mg total) by mouth 2 (two) times daily with a meal.  Dispense: 180 tablet; Refill: 1  3. Hyperlipidemia associated with type 2 diabetes mellitus (HCC) Continue statin therapy - Lipid panel - Direct LDL  4. 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 Has supportive sisters nearby.   Partially dictated using Editor, commissioning. Any errors are unintentional.  Halina Maidens, MD [facility]  10/10/2022

## 2022-10-12 LAB — LIPID PANEL
Chol/HDL Ratio: 2.4 ratio (ref 0.0–4.4)
Cholesterol, Total: 92 mg/dL — ABNORMAL LOW (ref 100–199)
HDL: 39 mg/dL — ABNORMAL LOW (ref >39)
LDL Chol Calc (NIH): 32 mg/dL (ref 0–99)
Triglycerides: 113 mg/dL (ref 0–149)
VLDL Cholesterol Cal: 21 mg/dL (ref 5–40)

## 2022-10-12 LAB — COMPREHENSIVE METABOLIC PANEL
ALT: 22 IU/L (ref 0–32)
AST: 21 IU/L (ref 0–40)
Albumin/Globulin Ratio: 2 (ref 1.2–2.2)
Albumin: 4.5 g/dL (ref 3.9–4.9)
Alkaline Phosphatase: 76 IU/L (ref 44–121)
BUN/Creatinine Ratio: 25 (ref 12–28)
BUN: 22 mg/dL (ref 8–27)
Bilirubin Total: 0.2 mg/dL (ref 0.0–1.2)
CO2: 26 mmol/L (ref 20–29)
Calcium: 9.9 mg/dL (ref 8.7–10.3)
Chloride: 100 mmol/L (ref 96–106)
Creatinine, Ser: 0.89 mg/dL (ref 0.57–1.00)
Globulin, Total: 2.2 g/dL (ref 1.5–4.5)
Glucose: 151 mg/dL — ABNORMAL HIGH (ref 70–99)
Potassium: 4.8 mmol/L (ref 3.5–5.2)
Sodium: 142 mmol/L (ref 134–144)
Total Protein: 6.7 g/dL (ref 6.0–8.5)
eGFR: 70 mL/min/{1.73_m2} (ref >59)

## 2022-10-12 LAB — HEMOGLOBIN A1C
Est. average glucose Bld gHb Est-mCnc: 123 mg/dL
Hgb A1c MFr Bld: 5.9 % — ABNORMAL HIGH (ref 4.8–5.6)

## 2022-10-12 LAB — LDL CHOLESTEROL, DIRECT: LDL Direct: 37 mg/dL (ref 0–99)

## 2022-11-07 ENCOUNTER — Other Ambulatory Visit: Payer: Self-pay | Admitting: Internal Medicine

## 2022-11-07 DIAGNOSIS — E118 Type 2 diabetes mellitus with unspecified complications: Secondary | ICD-10-CM

## 2022-11-07 IMAGING — CT CT HEAD W/O CM
3 series · 15 of 47 positions shown, 18 images · non-contrast
Comparison: No pertinent prior exams available for comparison.

CLINICAL DATA: Stroke suspected.

EXAM:
CT HEAD WITHOUT CONTRAST
TECHNIQUE: Contiguous axial images were obtained from the base of the skull
through the vertex without intravenous contrast.

[Series 2: head wo · axial · 0.43mm/px · z∈[-71,+64]mm · 9 of 33 slices shown, 12 images]
[im 3/33  brain]
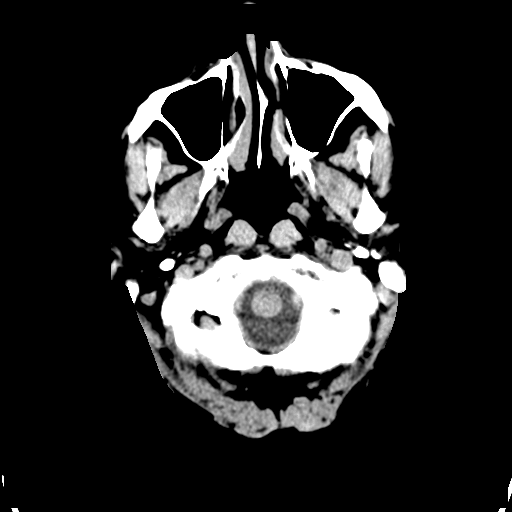
[im 3/33  bone]
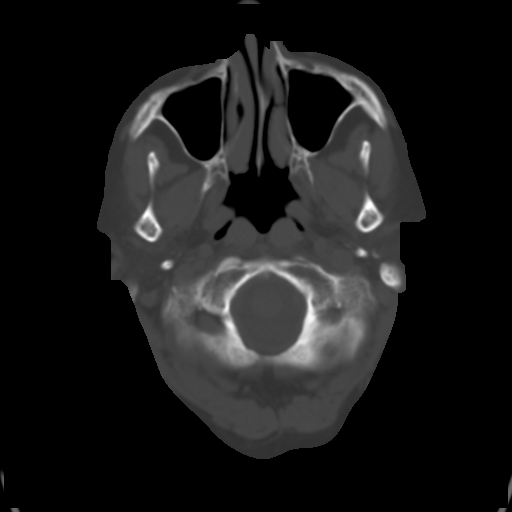
[im 6/33  brain]
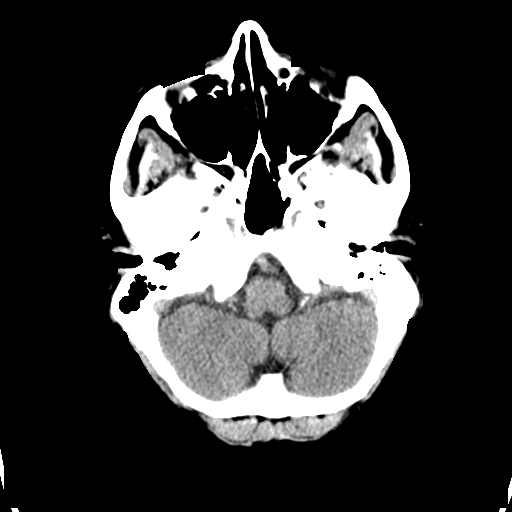
[im 9/33  brain]
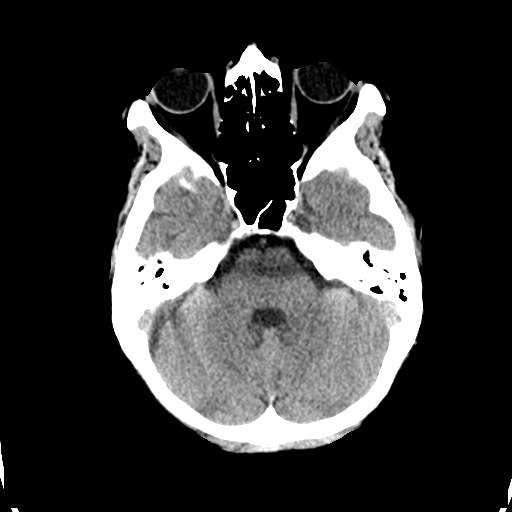
[im 13/33  brain]
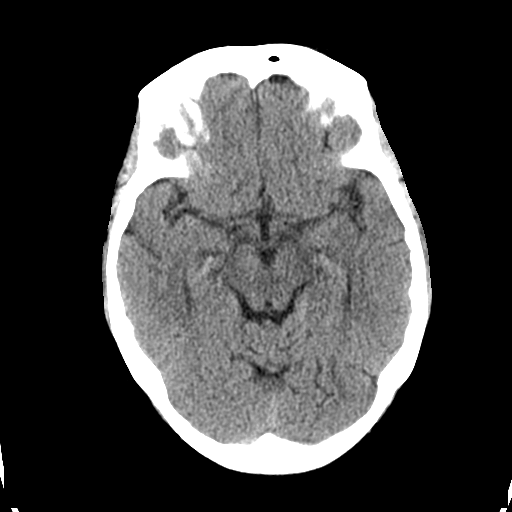
[im 17/33  brain]
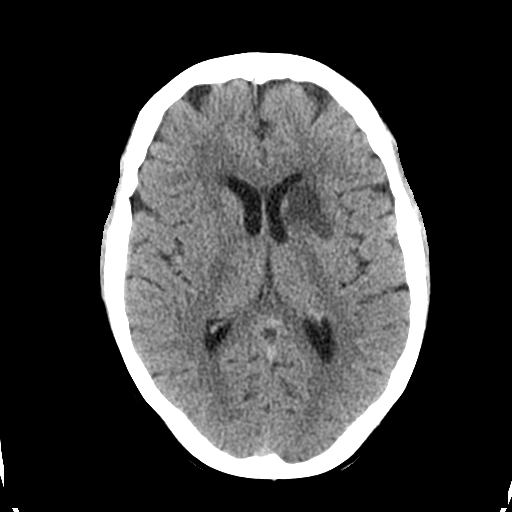
[im 17/33  bone]
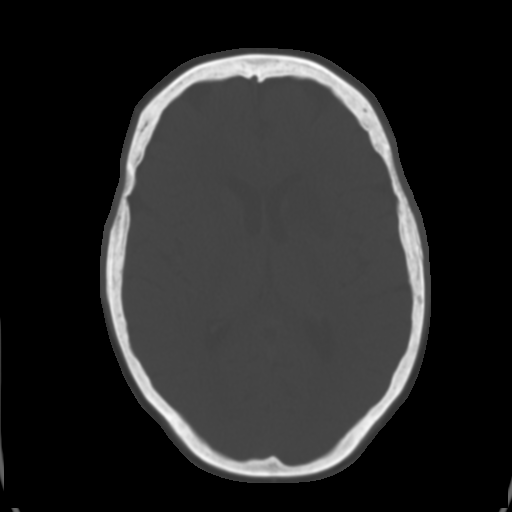
[im 20/33  brain]
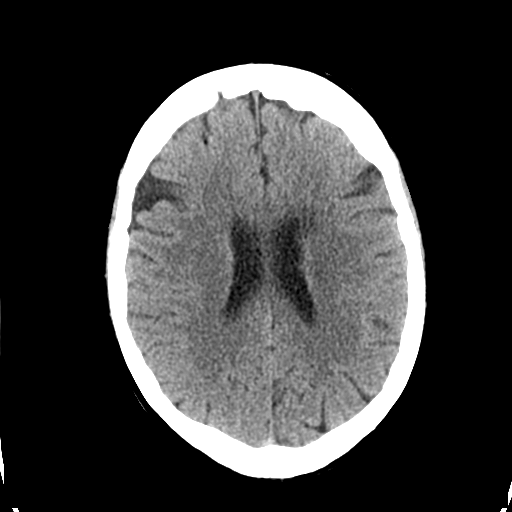
[im 24/33  brain]
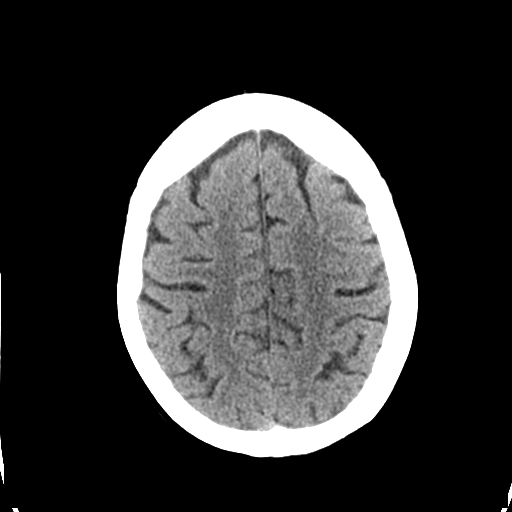
[im 27/33  brain]
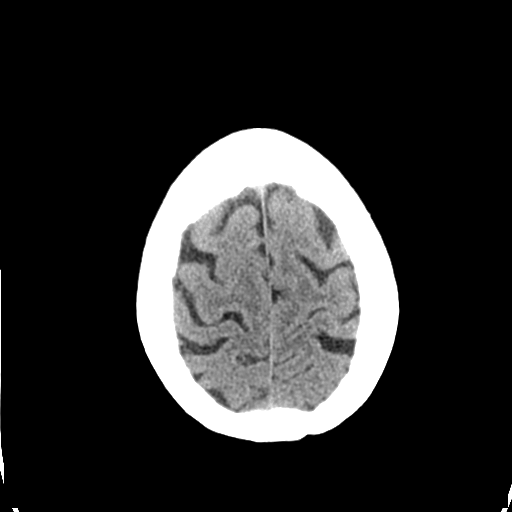
[im 30/33  brain]
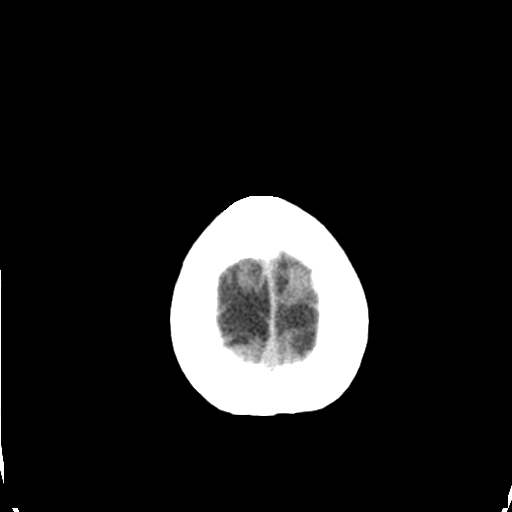
[im 30/33  bone]
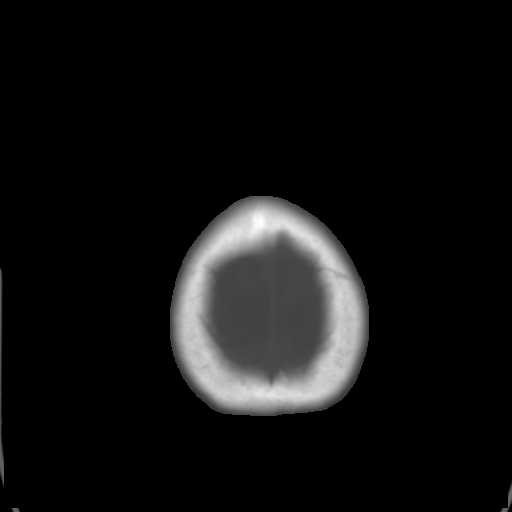

[Series 4: coronal soft tissue · coronal · 0.31mm/px · 3 of 68 slices shown]
[im 23/68  brain]
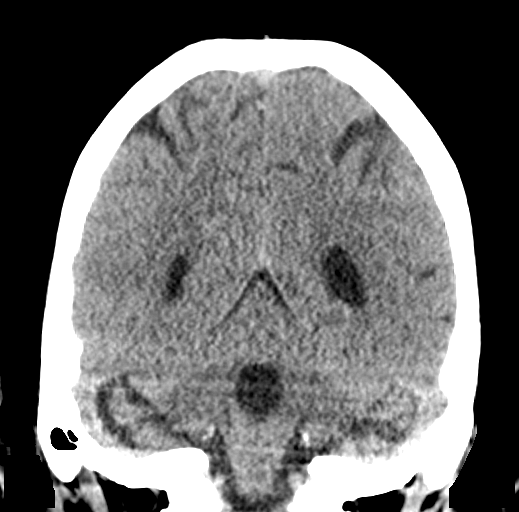
[im 30/68  brain]
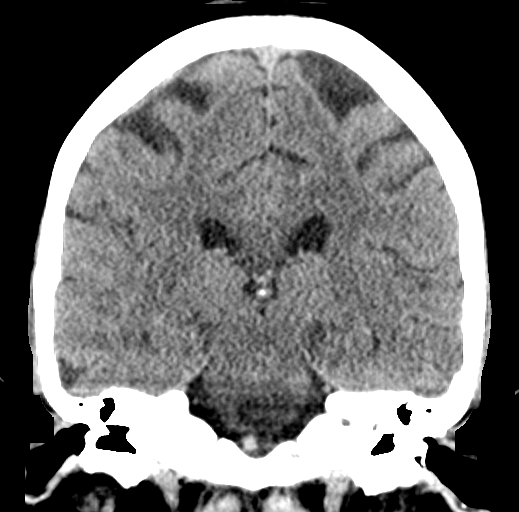
[im 38/68  brain]
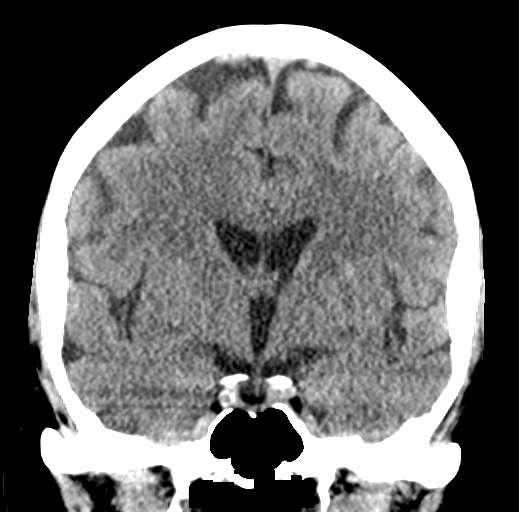

[Series 5: sagittal soft tissue · sagittal · 0.31mm/px · 3 of 54 slices shown]
[im 18/54  brain]
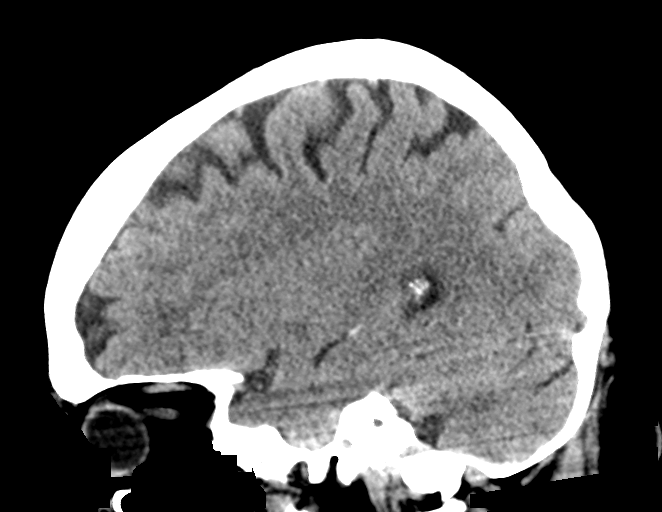
[im 27/54  brain]
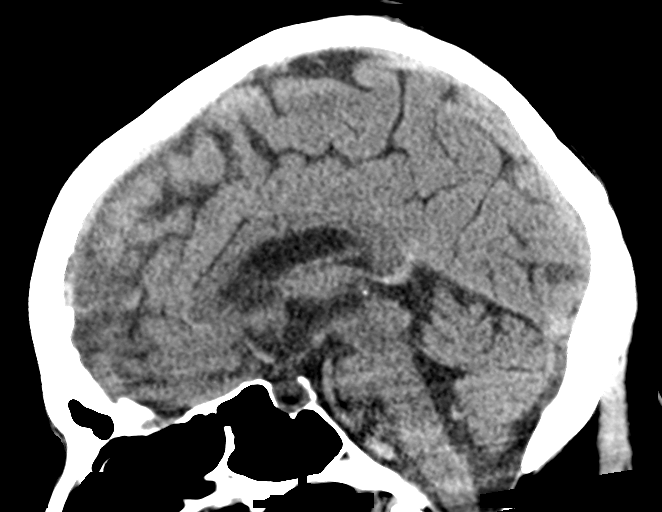
[im 36/54  brain]
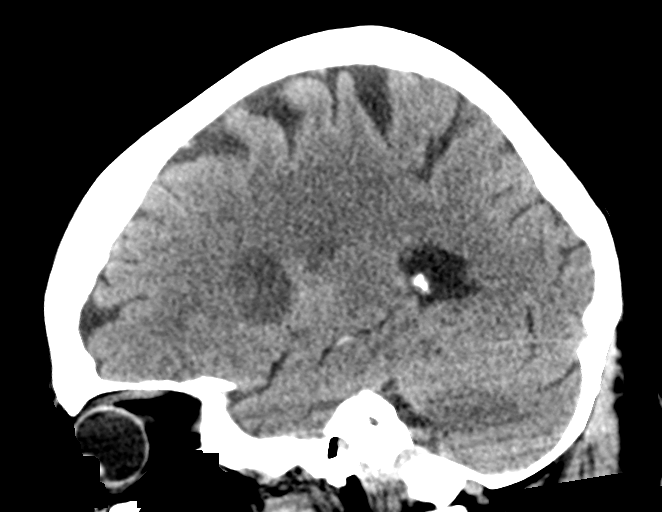

[15 of 47 positions shown; findings below may reference images not displayed]

FINDINGS: Brain:

Cerebral volume is normal for age.

3.3 x 1.6 x 2.4 cm acute or early subacute infarct within the left
corona/internal capsule and ganglia. No evidence of hemorrhagic
conversion. No significant mass effect at this time.

No demarcated cortical infarct.

No extra-axial fluid collection.

No evidence of intracranial mass.

No midline shift.

Vascular: No hyperdense vessel.  Atherosclerotic calcifications.

Skull: Normal. Negative for fracture or focal lesion.

Sinuses/Orbits: Visualized orbits show no acute finding. Foci of
polypoid mucosal thickening versus mucous retention cysts within the
right ethmoid air cells.

Other: Left mastoid effusion.

These results were called by telephone at the time of interpretation
on 05/23/2021 at [DATE] to provider Dr. Leyda, who verbally
acknowledged these results.
IMPRESSION: 3.3 x 2.4 cm acute or early subacute infarct within the left
corona/internal capsule and basal ganglia. No evidence of
hemorrhagic conversion. No significant mass effect at this time.

Mild right ethmoid sinus disease.

Left mastoid effusion.

## 2022-11-17 ENCOUNTER — Other Ambulatory Visit: Payer: Self-pay | Admitting: Internal Medicine

## 2022-11-17 NOTE — Telephone Encounter (Signed)
Requested Prescriptions  Pending Prescriptions Disp Refills   amLODipine (NORVASC) 5 MG tablet [Pharmacy Med Name: AMLODIPINE BESYLATE 5 MG TAB] 90 tablet 1    Sig: TAKE 1 TABLET (5 MG TOTAL) BY MOUTH DAILY.     Cardiovascular: Calcium Channel Blockers 2 Passed - 11/17/2022 12:44 AM      Passed - Last BP in normal range    BP Readings from Last 1 Encounters:  10/10/22 120/64         Passed - Last Heart Rate in normal range    Pulse Readings from Last 1 Encounters:  10/10/22 97         Passed - Valid encounter within last 6 months    Recent Outpatient Visits           1 month ago Essential hypertension   [place] [facility] at [facility], Nyoka Cowden, MD   5 months ago Essential hypertension   [place] [facility] at [facility], Nyoka Cowden, MD   9 months ago Essential hypertension   [place] [facility] at [facility], Nyoka Cowden, MD   1 year ago Type II diabetes mellitus with complication [facility])   [place] [facility] at [facility], Nyoka Cowden, MD   1 year ago Type II diabetes mellitus with complication [facility])   [place] [facility] at [facility], Nyoka Cowden, MD       Future Appointments             In 2 months Judithann Graves, Nyoka Cowden, MD [facility] at [facility], [facility]

## 2022-11-22 ENCOUNTER — Other Ambulatory Visit: Payer: Self-pay | Admitting: Internal Medicine

## 2022-12-19 ENCOUNTER — Ambulatory Visit: Payer: Medicare Other | Admitting: Physical Medicine and Rehabilitation

## 2023-02-08 ENCOUNTER — Encounter: Payer: Self-pay | Admitting: Internal Medicine

## 2023-02-08 ENCOUNTER — Ambulatory Visit (INDEPENDENT_AMBULATORY_CARE_PROVIDER_SITE_OTHER): Payer: Medicare Other | Admitting: Internal Medicine

## 2023-02-08 VITALS — BP 128/78 | HR 87 | Ht 68.0 in | Wt 137.0 lb

## 2023-02-08 DIAGNOSIS — E118 Type 2 diabetes mellitus with unspecified complications: Secondary | ICD-10-CM

## 2023-02-08 DIAGNOSIS — H6123 Impacted cerumen, bilateral: Secondary | ICD-10-CM | POA: Diagnosis not present

## 2023-02-08 DIAGNOSIS — I69321 Dysphasia following cerebral infarction: Secondary | ICD-10-CM | POA: Diagnosis not present

## 2023-02-08 DIAGNOSIS — I10 Essential (primary) hypertension: Secondary | ICD-10-CM | POA: Diagnosis not present

## 2023-02-08 DIAGNOSIS — E1169 Type 2 diabetes mellitus with other specified complication: Secondary | ICD-10-CM

## 2023-02-08 DIAGNOSIS — E785 Hyperlipidemia, unspecified: Secondary | ICD-10-CM

## 2023-02-08 LAB — POCT GLYCOSYLATED HEMOGLOBIN (HGB A1C): Hemoglobin A1C: 6.8 % — AB (ref 4.0–5.6)

## 2023-02-08 MED ORDER — ACCU-CHEK GUIDE VI STRP: ORAL_STRIP | 1 refills | Status: DC

## 2023-02-08 NOTE — Assessment & Plan Note (Addendum)
Clinically stable without s/s of hypoglycemia. Tolerating metformin well without side effects or other concerns. Has been off insulin for about one year. Lab Results  Component Value Date   HGBA1C 5.9 (H) 10/10/2022  A1C today slightly higher - no change in medications.

## 2023-02-08 NOTE — Assessment & Plan Note (Signed)
Clinically stable exam with well controlled BP on amlodipine. Tolerating medications without side effects. Pt to continue current regimen and low sodium diet.  

## 2023-02-08 NOTE — Assessment & Plan Note (Signed)
On appropriate statin therapy Lab Results  Component Value Date   LDLCALC 32 10/10/2022

## 2023-02-08 NOTE — Assessment & Plan Note (Signed)
Stable mild residual. Doing well with support from her sister who is local. Continues on statin and aspirin.

## 2023-02-08 NOTE — Progress Notes (Unsigned)
Date:  02/08/2023   Name:  Laura [name]   DOB:  [birth year]   MRN:  [id number]   Chief Complaint: Hypertension and Diabetes  Hypertension This is a chronic problem. The problem is controlled. Pertinent negatives include no chest pain, headaches or shortness of breath. Past treatments include calcium channel blockers. Hypertensive end-organ damage includes CVA. There is no history of kidney disease or CAD/MI.  Diabetes She presents for her follow-up diabetic visit. She has type 2 diabetes mellitus. Her disease course has been stable. There are no hypoglycemic associated symptoms. Pertinent negatives for hypoglycemia include no dizziness, headaches or nervousness/anxiousness. Pertinent negatives for diabetes include no chest pain, no fatigue, no foot paresthesias and no weight loss. Diabetic complications include a CVA. Current diabetic treatment includes oral agent (monotherapy). She is compliant with treatment all of the time. An ACE inhibitor/angiotensin II receptor blocker is not being taken. Eye exam is current.  Hyperlipidemia This is a chronic problem. The problem is controlled. Pertinent negatives include no chest pain or shortness of breath. Current antihyperlipidemic treatment includes statins.    Lab Results  Component Value Date   NA 142 10/10/2022   K 4.8 10/10/2022   CO2 26 10/10/2022   GLUCOSE 151 (H) 10/10/2022   BUN 22 10/10/2022   CREATININE 0.89 10/10/2022   CALCIUM 9.9 10/10/2022   EGFR 70 10/10/2022   GFRNONAA >60 05/31/2021   Lab Results  Component Value Date   CHOL 92 (L) 10/10/2022   HDL 39 (L) 10/10/2022   LDLCALC 32 10/10/2022   LDLDIRECT 37 10/10/2022   TRIG 113 10/10/2022   CHOLHDL 2.4 10/10/2022   Lab Results  Component Value Date   TSH 3.160 05/23/2021   Lab Results  Component Value Date   HGBA1C 6.8 (A) 02/08/2023   Lab Results  Component Value Date   WBC 8.7 02/04/2022   HGB 12.0 02/04/2022   HCT 35.5 02/04/2022   MCV 86 02/04/2022    PLT 386 02/04/2022   Lab Results  Component Value Date   ALT 22 10/10/2022   AST 21 10/10/2022   ALKPHOS 76 10/10/2022   BILITOT 0.2 10/10/2022   Lab Results  Component Value Date   VD25OH 114.0 (H) 02/04/2022     Review of Systems  Constitutional:  Negative for fatigue, fever, unexpected weight change and weight loss.  HENT:  Positive for hearing loss.   Respiratory:  Negative for chest tightness, shortness of breath and wheezing.   Cardiovascular:  Negative for chest pain and leg swelling.  Musculoskeletal:  Negative for arthralgias and gait problem.  Allergic/Immunologic: Negative for environmental allergies.  Neurological:  Negative for dizziness and headaches.  Psychiatric/Behavioral:  Negative for dysphoric mood and sleep disturbance. The patient is not nervous/anxious.     Patient Active Problem List   Diagnosis Date Noted  . 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/06/2021  . Hyperlipidemia associated with type 2 diabetes mellitus ([place]) 06/02/2021  . 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 Vitamin D deficiency 06/02/2021  . Type II diabetes mellitus with complication ([place])   . Vitamin B12 deficiency   . Essential hypertension 05/23/2021    No Known Allergies  Past Surgical History:  Procedure Laterality Date  . CATARACT EXTRACTION W/PHACO Right 11/10/2021   Procedure: CATARACT EXTRACTION PHACO AND INTRAOCULAR LENS PLACEMENT (IOC) RIGHT DIABETIC  22.44 02:07.2;  Surgeon: Leandrew Koyanagi, MD;  Location[facility];  Service: Ophthalmology;  Laterality: Right;  . CATARACT EXTRACTION W/PHACO Left 12/01/2021   Procedure: CATARACT EXTRACTION PHACO AND INTRAOCULAR LENS PLACEMENT (IOC) LEFT DIABETIC 3.10 00:41.8;  Surgeon: Leandrew Koyanagi, MD;  Location: [place];  Service: Ophthalmology;  Laterality: Left;  . FOOT SURGERY    .  FRACTURE SURGERY  ?   Foot fracture, no surgery  . TONSILLECTOMY      Social History   Tobacco Use  . Smoking status: Never    Passive exposure: Never  . Smokeless tobacco: Never  Vaping Use  . Vaping Use: Never used  Substance Use Topics  . Alcohol use: Never  . Drug use: Never     Medication list has been reviewed and updated.  Current Meds  Medication Sig  . Accu-Chek Softclix Lancets lancets Use twice a day  . amLODipine (NORVASC) 5 MG tablet TAKE 1 TABLET (5 MG TOTAL) BY MOUTH DAILY.  Marland Kitchen aspirin 81 MG EC tablet Take 1 tablet (81 mg total) by mouth daily. Swallow whole.  Marland Kitchen atorvastatin (LIPITOR) 80 MG tablet TAKE 1 TABLET BY MOUTH EVERY DAY IN THE EVENING  . Cholecalciferol (VITAMIN D-3 PO) Take by mouth daily.  . cyanocobalamin 1000 MCG tablet Take 1 tablet (1,000 mcg total) by mouth daily.  . metFORMIN (GLUCOPHAGE) 500 MG tablet Take 1 tablet (500 mg total) by mouth 2 (two) times daily with a meal.  . [DISCONTINUED] ACCU-CHEK GUIDE test strip USE TO TEST BLOOD SUGAR TWICE A DAY       10/10/2022    2:42 PM 06/08/2022    1:51 PM 02/04/2022    1:42 PM 10/27/2021    1:43 PM  GAD 7 : Generalized Anxiety Score  Nervous, Anxious, on Edge 0 0 0 0  Control/stop worrying 0 0 0 0  Worry too much - different things 0 0 0 0  Trouble relaxing 0 0 0 0  Restless 0 0 0 0  Easily annoyed or irritable 0 0 0 0  Afraid - awful might happen 0 0 0 0  Total GAD 7 Score 0 0 0 0  Anxiety Difficulty Not difficult at all Not difficult at all Not difficult at all Not difficult at all       10/10/2022    2:41 PM 08/19/2022    2:20 PM 06/08/2022    1:51 PM  Depression screen PHQ 2/9  Decreased Interest 0 0 0  Down, Depressed, Hopeless 0 0 0  PHQ - 2 Score 0 0 0  Altered sleeping 0  0  Tired, decreased energy 0  0  Change in appetite 0  0  Feeling bad or failure about yourself  0  0  Trouble concentrating 0  0  Moving slowly or fidgety/restless 0  0  Suicidal thoughts 0  0  PHQ-9 Score  0  0  Difficult doing work/chores Not difficult at all  Not difficult at all    BP Readings from Last 3 Encounters:  02/08/23 128/78  10/10/22 120/64  08/19/22 118/72    Physical Exam Vitals and nursing note reviewed.  Constitutional:      General: She is not in acute distress.    Appearance: Normal appearance. She is well-developed.  HENT:     Head: Normocephalic and atraumatic.     Right Ear: External ear normal. Decreased hearing noted.     Left Ear: External ear normal. Decreased hearing noted.     Ears:     Comments: Excessive cerumen in  both ear canals Cardiovascular:     Rate and Rhythm: Normal rate and regular rhythm.  Pulmonary:     Effort: Pulmonary effort is normal. No respiratory distress.     Breath sounds: No wheezing or rhonchi.  Musculoskeletal:     Cervical back: Normal range of motion.     Right lower leg: No edema.     Left lower leg: No edema.  Skin:    General: Skin is warm and dry.     Findings: No rash.  Neurological:     Mental Status: She is alert and oriented to person, place, and time.  Psychiatric:        Mood and Affect: Mood normal.        Behavior: Behavior normal.    Wt Readings from Last 3 Encounters:  02/08/23 137 lb (62.1 kg)  10/10/22 129 lb (58.5 kg)  08/19/22 128 lb 6.4 oz (58.2 kg)    BP 128/78   Pulse 87   Ht '5\' 8"'$  (1.727 m)   Wt 137 lb (62.1 kg)   SpO2 97%   BMI 20.83 kg/m   Assessment and Plan: Problem List Items Addressed This Visit       Cardiovascular and Mediastinum   Essential hypertension - Primary (Chronic)    Clinically stable exam with well controlled BP on amlodipine. Tolerating medications without side effects. Pt to continue current regimen and low sodium diet.         Endocrine   Hyperlipidemia associated with type 2 diabetes mellitus ([place]) (Chronic)    On appropriate statin therapy Lab Results  Component Value Date   LDLCALC 32 10/10/2022        Type II diabetes mellitus with  complication (HCC) (Chronic)    Clinically stable without s/s of hypoglycemia. Tolerating metformin well without side effects or other concerns. Has been off insulin for about one year. Lab Results  Component Value Date   HGBA1C 5.9 (H) 10/10/2022  A1C today slightly higher - no change in medications.       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 Doing well with support from her sister who is local. Continues on statin and aspirin.       Other Visit Diagnoses     Excessive cerumen in both ear canals       Relevant Orders   Ambulatory referral to ENT        Partially dictated using Dragon software. Any errors are unintentional.  Halina Maidens, MD [facility]  02/09/2023

## 2023-02-18 ENCOUNTER — Other Ambulatory Visit: Payer: Self-pay | Admitting: Internal Medicine

## 2023-04-13 ENCOUNTER — Other Ambulatory Visit: Payer: Self-pay | Admitting: Internal Medicine

## 2023-04-13 DIAGNOSIS — E118 Type 2 diabetes mellitus with unspecified complications: Secondary | ICD-10-CM

## 2023-05-13 ENCOUNTER — Other Ambulatory Visit: Payer: Self-pay | Admitting: Internal Medicine

## 2023-05-15 NOTE — Telephone Encounter (Signed)
Requested Prescriptions  Pending Prescriptions Disp Refills   amLODipine (NORVASC) 5 MG tablet [Pharmacy Med Name: AMLODIPINE BESYLATE 5 MG TAB] 90 tablet 1    Sig: TAKE 1 TABLET (5 MG TOTAL) BY MOUTH DAILY.     Cardiovascular: Calcium Channel Blockers 2 Passed - 05/13/2023  8:42 AM      Passed - Last BP in normal range    BP Readings from Last 1 Encounters:  02/08/23 128/78         Passed - Last Heart Rate in normal range    Pulse Readings from Last 1 Encounters:  02/08/23 87         Passed - Valid encounter within last 6 months    Recent Outpatient Visits           3 months ago Essential hypertension   [place] [facility] at [facility], Nyoka Cowden, MD   7 months ago Essential hypertension   [place] [facility] at [facility], Nyoka Cowden, MD   11 months ago Essential hypertension   [place] [facility] at [facility], Nyoka Cowden, MD   1 year ago Essential hypertension   [place] [facility] at [facility] Nyoka Cowden, MD   1 year ago Type II diabetes mellitus with complication [facility])   [place] [facility] at [facility], Nyoka Cowden, MD       Future Appointments             In 1 month Judithann Graves, Nyoka Cowden, MD [facility] at [facility], [facility]

## 2023-06-16 ENCOUNTER — Encounter: Payer: Self-pay | Admitting: Internal Medicine

## 2023-06-16 ENCOUNTER — Ambulatory Visit (INDEPENDENT_AMBULATORY_CARE_PROVIDER_SITE_OTHER): Payer: Medicare Other | Admitting: Internal Medicine

## 2023-06-16 VITALS — BP 106/70 | HR 88 | Ht 68.0 in | Wt 139.0 lb

## 2023-06-16 DIAGNOSIS — I69998 Other sequelae following unspecified cerebrovascular disease: Secondary | ICD-10-CM | POA: Diagnosis not present

## 2023-06-16 DIAGNOSIS — I10 Essential (primary) hypertension: Secondary | ICD-10-CM | POA: Diagnosis not present

## 2023-06-16 DIAGNOSIS — E1169 Type 2 diabetes mellitus with other specified complication: Secondary | ICD-10-CM

## 2023-06-16 DIAGNOSIS — E785 Hyperlipidemia, unspecified: Secondary | ICD-10-CM

## 2023-06-16 DIAGNOSIS — E118 Type 2 diabetes mellitus with unspecified complications: Secondary | ICD-10-CM | POA: Diagnosis not present

## 2023-06-16 DIAGNOSIS — R531 Weakness: Secondary | ICD-10-CM

## 2023-06-16 DIAGNOSIS — Z7984 Long term (current) use of oral hypoglycemic drugs: Secondary | ICD-10-CM

## 2023-06-16 NOTE — Assessment & Plan Note (Signed)
Tolerating statin medications.  No side effects noted. LDL is  Lab Results  Component Value Date   LDLCALC 32 10/10/2022

## 2023-06-16 NOTE — Assessment & Plan Note (Signed)
Stable with minimal residual On statin and aspirin

## 2023-06-16 NOTE — Assessment & Plan Note (Signed)
Stable exam with well controlled BP.  Currently taking amlodipine. Tolerating medications without concerns or side effects. Will continue to recommend low sodium diet and current regimen.  

## 2023-06-16 NOTE — Progress Notes (Signed)
Date:  06/16/2023   Name:  Laura [name]   DOB:  [birth year]   MRN:  [id number]   Chief Complaint: Annual Exam Laura [name] is a 72 y.o. female who presents today for her Complete Annual Exam. She feels well. She reports exercising some. She reports she is sleeping well. Breast complaints - none.  She is here today by herself.    Mammogram: none DEXA: 2022 osteopenia Colonoscopy: none  Health Maintenance Due  Topic Date Due   COVID-19 Vaccine (5 - 2023-24 season) 11/05/2022   Diabetic kidney evaluation - Urine ACR  06/09/2023    Immunization History  Administered Date(s) Administered   Fluad Quad(high Dose 65+) 08/19/2022   Influenza-Unspecified 10/09/2021   PFIZER Comirnaty(Gray Top)Covid-19 Tri-Sucrose Vaccine 09/23/2021, 10/14/2021, 12/18/2021   PNEUMOCOCCAL CONJUGATE-20 07/26/2021   Respiratory Syncytial Virus Vaccine,Recomb Aduvanted(Arexvy) 09/22/2022   Tdap 02/01/2023   Unspecified SARS-COV-2 Vaccination 09/10/2022   Zoster Recombinant(Shingrix) 09/29/2022, 01/12/2023    Hypertension This is a chronic problem. The problem is controlled. Associated symptoms include headaches. Pertinent negatives include no chest pain, palpitations or shortness of breath. Past treatments include calcium channel blockers. Hypertensive end-organ damage includes CVA. There is no history of kidney disease or CAD/MI.  Diabetes She presents for her follow-up diabetic visit. She has type 2 diabetes mellitus. Her disease course has been stable. Hypoglycemia symptoms include headaches and speech difficulty (mild dysarthria). Pertinent negatives for hypoglycemia include no dizziness, nervousness/anxiousness or tremors. Pertinent negatives for diabetes include no chest pain, no fatigue, no polydipsia and no polyuria. Diabetic complications include a CVA. Current diabetic treatment includes oral agent (monotherapy). She is compliant with treatment all of the time. Her weight is stable. She is following a  generally healthy diet. There is no change in her home blood glucose trend. Her breakfast blood glucose is taken between 7-8 am. Her breakfast blood glucose range is generally 90-110 mg/dl.  Hyperlipidemia This is a chronic problem. The problem is controlled. Pertinent negatives include no chest pain or shortness of breath. Current antihyperlipidemic treatment includes statins.    Lab Results  Component Value Date   NA 142 10/10/2022   K 4.8 10/10/2022   CO2 26 10/10/2022   GLUCOSE 151 (H) 10/10/2022   BUN 22 10/10/2022   CREATININE 0.89 10/10/2022   CALCIUM 9.9 10/10/2022   EGFR 70 10/10/2022   GFRNONAA >60 05/31/2021   Lab Results  Component Value Date   CHOL 92 (L) 10/10/2022   HDL 39 (L) 10/10/2022   LDLCALC 32 10/10/2022   LDLDIRECT 37 10/10/2022   TRIG 113 10/10/2022   CHOLHDL 2.4 10/10/2022   Lab Results  Component Value Date   TSH 3.160 05/23/2021   Lab Results  Component Value Date   HGBA1C 6.8 (A) 02/08/2023   Lab Results  Component Value Date   WBC 8.7 02/04/2022   HGB 12.0 02/04/2022   HCT 35.5 02/04/2022   MCV 86 02/04/2022   PLT 386 02/04/2022   Lab Results  Component Value Date   ALT 22 10/10/2022   AST 21 10/10/2022   ALKPHOS 76 10/10/2022   BILITOT 0.2 10/10/2022   Lab Results  Component Value Date   VD25OH 114.0 (H) 02/04/2022     Review of Systems  Constitutional:  Negative for chills, fatigue and fever.  HENT:  Negative for congestion, hearing loss, tinnitus, trouble swallowing and voice change.   Eyes:  Negative for visual disturbance.  Respiratory:  Negative for cough, chest tightness, shortness of breath and wheezing.  Cardiovascular:  Negative for chest pain, palpitations and leg swelling.  Gastrointestinal:  Negative for abdominal pain, constipation, diarrhea and vomiting.  Endocrine: Negative for polydipsia and polyuria.  Genitourinary:  Negative for dysuria, frequency, genital sores, vaginal bleeding and vaginal discharge.   Musculoskeletal:  Negative for arthralgias, gait problem and joint swelling.  Skin:  Negative for color change and rash.  Neurological:  Positive for speech difficulty (mild dysarthria) and headaches. Negative for dizziness, tremors and light-headedness.  Hematological:  Negative for adenopathy. Does not bruise/bleed easily.  Psychiatric/Behavioral:  Negative for dysphoric mood and sleep disturbance. The patient is not nervous/anxious.     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) 06/02/2021   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 (CVA) 06/02/2021   Vitamin D deficiency 06/02/2021   Type II diabetes mellitus with complication (HCC)    Vitamin B12 deficiency    Essential hypertension 05/23/2021    No Known Allergies  Past Surgical History:  Procedure Laterality Date   CATARACT EXTRACTION W/PHACO Right 11/10/2021   Procedure: CATARACT EXTRACTION PHACO AND INTRAOCULAR LENS PLACEMENT (IOC) RIGHT DIABETIC  22.44 02:07.2;  Surgeon: Lockie Mola, MD;  Location: [facility];  Service: Ophthalmology;  Laterality: Right;   CATARACT EXTRACTION W/PHACO Left 12/01/2021   Procedure: CATARACT EXTRACTION PHACO AND INTRAOCULAR LENS PLACEMENT (IOC) LEFT DIABETIC 3.10 00:41.8;  Surgeon: Lockie Mola, MD;  Location: [facility];  Service: Ophthalmology;  Laterality: Left;   FOOT SURGERY     FRACTURE SURGERY  ?   Foot fracture, no surgery   TONSILLECTOMY      Social History   Tobacco Use   Smoking status: Never    Passive exposure: Never   Smokeless tobacco: Never  Vaping Use   Vaping status: Never Used  Substance Use Topics   Alcohol use: Never   Drug use: Never     Medication list has been reviewed and updated.  Current Meds  Medication Sig    Accu-Chek Softclix Lancets lancets Use twice a day   amLODipine (NORVASC) 5 MG tablet TAKE 1 TABLET (5 MG TOTAL) BY MOUTH DAILY.   aspirin 81 MG EC tablet Take 1 tablet (81 mg total) by mouth daily. Swallow whole.   atorvastatin (LIPITOR) 80 MG tablet TAKE 1 TABLET BY MOUTH EVERY DAY IN THE EVENING   Cholecalciferol (VITAMIN D-3 PO) Take by mouth daily.   cyanocobalamin 1000 MCG tablet Take 1 tablet (1,000 mcg total) by mouth daily.   glucose blood (ACCU-CHEK GUIDE) test strip Use as instructed   metFORMIN (GLUCOPHAGE) 500 MG tablet TAKE 1 TABLET BY MOUTH 2 TIMES DAILY WITH A MEAL.       06/16/2023    9:35 AM 10/10/2022    2:42 PM 06/08/2022    1:51 PM 02/04/2022    1:42 PM  GAD 7 : Generalized Anxiety Score  Nervous, Anxious, on Edge 0 0 0 0  Control/stop worrying 0 0 0 0  Worry too much - different things 0 0 0 0  Trouble relaxing 0 0 0 0  Restless 0 0 0 0  Easily annoyed or irritable 0 0 0 0  Afraid - awful might happen 0 0 0 0  Total GAD 7 Score 0 0 0 0  Anxiety Difficulty Not difficult at all Not difficult at all Not difficult  at all Not difficult at all       06/16/2023    9:35 AM 10/10/2022    2:41 PM 08/19/2022    2:20 PM  Depression screen PHQ 2/9  Decreased Interest 0 0 0  Down, Depressed, Hopeless 0 0 0  PHQ - 2 Score 0 0 0  Altered sleeping 0 0   Tired, decreased energy 0 0   Change in appetite 0 0   Feeling bad or failure about yourself  0 0   Trouble concentrating 0 0   Moving slowly or fidgety/restless 0 0   Suicidal thoughts 0 0   PHQ-9 Score 0 0   Difficult doing work/chores Not difficult at all Not difficult at all     BP Readings from Last 3 Encounters:  06/16/23 106/70  02/08/23 128/78  10/10/22 120/64    Physical Exam Vitals and nursing note reviewed.  Constitutional:      General: She is not in acute distress.    Appearance: She is well-developed.  HENT:     Head: Normocephalic and atraumatic.     Right Ear: Tympanic membrane and ear canal  normal.     Left Ear: Tympanic membrane and ear canal normal.     Nose:     Right Sinus: No maxillary sinus tenderness.     Left Sinus: No maxillary sinus tenderness.  Eyes:     General: No scleral icterus.       Right eye: No discharge.        Left eye: No discharge.     Conjunctiva/sclera: Conjunctivae normal.  Neck:     Thyroid: No thyromegaly.     Vascular: No carotid bruit.  Cardiovascular:     Rate and Rhythm: Normal rate and regular rhythm.     Pulses: Normal pulses.     Heart sounds: Normal heart sounds.  Pulmonary:     Effort: Pulmonary effort is normal. No respiratory distress.     Breath sounds: No wheezing.  Chest:  Breasts:    Right: No mass, nipple discharge, skin change or tenderness.     Left: No mass, nipple discharge, skin change or tenderness.  Abdominal:     General: Bowel sounds are normal.     Palpations: Abdomen is soft.     Tenderness: There is no abdominal tenderness.  Musculoskeletal:     Cervical back: Normal range of motion. No erythema.     Right lower leg: No edema.     Left lower leg: No edema.  Lymphadenopathy:     Cervical: No cervical adenopathy.  Skin:    General: Skin is warm and dry.     Findings: No rash.  Neurological:     Mental Status: She is alert and oriented to person, place, and time.     Cranial Nerves: No cranial nerve deficit.     Sensory: No sensory deficit.     Motor: Motor function is intact.     Coordination: Coordination is intact.     Deep Tendon Reflexes: Reflexes are normal and symmetric.     Comments: Mild dysarthria - stable  Psychiatric:        Attention and Perception: Attention normal.        Mood and Affect: Mood normal.    Diabetic Foot Exam - Simple   Simple Foot Form Diabetic Foot exam was performed with the following findings: Yes 06/16/2023  9:46 AM  Visual Inspection No deformities, no ulcerations, no other skin breakdown bilaterally: Yes  Sensation Testing Intact to touch and monofilament  testing bilaterally: Yes Pulse Check Posterior Tibialis and Dorsalis pulse intact bilaterally: Yes Comments      Wt Readings from Last 3 Encounters:  06/16/23 139 lb (63 kg)  02/08/23 137 lb (62.1 kg)  10/10/22 129 lb (58.5 kg)    BP 106/70   Pulse 88   Ht 5\' 8"  (1.727 m)   Wt 139 lb (63 kg)   SpO2 98%   BMI 21.13 kg/m   Assessment and Plan:  Problem List Items Addressed This Visit     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 Currently being treated with metformin. Lab Results  Component Value Date   HGBA1C 6.8 (A) 02/08/2023  U/Cr due       Relevant Orders   Comprehensive metabolic panel   Hemoglobin A1c   TSH   Microalbumin / creatinine urine ratio   Hyperlipidemia associated with type 2 diabetes mellitus (HCC) (Chronic)    Tolerating statin medications.  No side effects noted. LDL is  Lab Results  Component Value Date   LDLCALC 32 10/10/2022         Relevant Orders   Lipid panel   Essential hypertension - Primary (Chronic)    Stable exam with well controlled BP.  Currently taking amlodipine. Tolerating medications without concerns or side effects. Will continue to recommend low sodium diet and current regimen.       Relevant Orders   CBC with Differential/Platelet   Other Visit Diagnoses     Long term current use of oral hypoglycemic drug          She continues to declines mammograms and CRC screenings.  Return in about 4 months (around 10/17/2023) for DM, HTN.   Partially dictated using Dragon software, any errors are not intentional.  Reubin Milan, MD [facility] [place], [place]

## 2023-06-16 NOTE — Assessment & Plan Note (Signed)
Blood sugars stable without hypoglycemic symptoms or events. Currently being treated with metformin. Lab Results  Component Value Date   HGBA1C 6.8 (A) 02/08/2023  U/Cr due

## 2023-06-17 LAB — MICROALBUMIN / CREATININE URINE RATIO
Creatinine, Urine: 227.1 mg/dL
Microalb/Creat Ratio: 28 mg/g creat (ref 0–29)
Microalbumin, Urine: 63.9 ug/mL

## 2023-06-17 LAB — LIPID PANEL
Chol/HDL Ratio: 2.3 ratio (ref 0.0–4.4)
Cholesterol, Total: 90 mg/dL — ABNORMAL LOW (ref 100–199)
HDL: 39 mg/dL — ABNORMAL LOW (ref >39)
LDL Chol Calc (NIH): 35 mg/dL (ref 0–99)
Triglycerides: 75 mg/dL (ref 0–149)
VLDL Cholesterol Cal: 16 mg/dL (ref 5–40)

## 2023-06-17 LAB — COMPREHENSIVE METABOLIC PANEL
ALT: 26 IU/L (ref 0–32)
AST: 25 IU/L (ref 0–40)
Albumin: 4.4 g/dL (ref 3.8–4.8)
Alkaline Phosphatase: 72 IU/L (ref 44–121)
BUN/Creatinine Ratio: 21 (ref 12–28)
BUN: 19 mg/dL (ref 8–27)
Bilirubin Total: 0.4 mg/dL (ref 0.0–1.2)
CO2: 25 mmol/L (ref 20–29)
Calcium: 10.2 mg/dL (ref 8.7–10.3)
Chloride: 103 mmol/L (ref 96–106)
Creatinine, Ser: 0.92 mg/dL (ref 0.57–1.00)
Globulin, Total: 2.5 g/dL (ref 1.5–4.5)
Glucose: 105 mg/dL — ABNORMAL HIGH (ref 70–99)
Potassium: 4.6 mmol/L (ref 3.5–5.2)
Sodium: 144 mmol/L (ref 134–144)
Total Protein: 6.9 g/dL (ref 6.0–8.5)
eGFR: 67 mL/min/{1.73_m2} (ref >59)

## 2023-06-17 LAB — CBC WITH DIFFERENTIAL/PLATELET
Basophils Absolute: 0 10*3/uL (ref 0.0–0.2)
Basos: 1 %
EOS (ABSOLUTE): 0.3 10*3/uL (ref 0.0–0.4)
Eos: 5 %
Hematocrit: 36.2 % (ref 34.0–46.6)
Hemoglobin: 12.4 g/dL (ref 11.1–15.9)
Immature Grans (Abs): 0 10*3/uL (ref 0.0–0.1)
Immature Granulocytes: 0 %
Lymphocytes Absolute: 1.6 10*3/uL (ref 0.7–3.1)
Lymphs: 24 %
MCH: 28.2 pg (ref 26.6–33.0)
MCHC: 34.3 g/dL (ref 31.5–35.7)
MCV: 83 fL (ref 79–97)
Monocytes Absolute: 0.4 10*3/uL (ref 0.1–0.9)
Monocytes: 7 %
Neutrophils Absolute: 4.1 10*3/uL (ref 1.4–7.0)
Neutrophils: 63 %
Platelets: 303 10*3/uL (ref 150–450)
RBC: 4.39 x10E6/uL (ref 3.77–5.28)
RDW: 11.9 % (ref 11.7–15.4)
WBC: 6.4 10*3/uL (ref 3.4–10.8)

## 2023-06-17 LAB — HEMOGLOBIN A1C
Est. average glucose Bld gHb Est-mCnc: 134 mg/dL
Hgb A1c MFr Bld: 6.3 % — ABNORMAL HIGH (ref 4.8–5.6)

## 2023-06-17 LAB — TSH: TSH: 4.64 u[IU]/mL — ABNORMAL HIGH (ref 0.450–4.500)

## 2023-06-17 LAB — SPECIMEN STATUS REPORT

## 2023-06-23 ENCOUNTER — Encounter: Payer: Self-pay | Admitting: Internal Medicine

## 2023-07-11 LAB — HM DIABETES EYE EXAM

## 2023-07-12 ENCOUNTER — Encounter: Payer: Self-pay | Admitting: Internal Medicine

## 2023-07-18 ENCOUNTER — Other Ambulatory Visit: Payer: Self-pay | Admitting: Internal Medicine

## 2023-07-18 DIAGNOSIS — E118 Type 2 diabetes mellitus with unspecified complications: Secondary | ICD-10-CM

## 2023-08-06 ENCOUNTER — Other Ambulatory Visit: Payer: Self-pay | Admitting: Internal Medicine

## 2023-08-06 DIAGNOSIS — E118 Type 2 diabetes mellitus with unspecified complications: Secondary | ICD-10-CM

## 2023-08-23 ENCOUNTER — Encounter: Payer: Self-pay | Admitting: Internal Medicine

## 2023-08-29 ENCOUNTER — Ambulatory Visit: Payer: Medicare Other

## 2023-08-29 VITALS — BP 102/58 | Ht 68.0 in | Wt 143.6 lb

## 2023-08-29 DIAGNOSIS — Z0000 Encounter for general adult medical examination without abnormal findings: Secondary | ICD-10-CM

## 2023-08-29 NOTE — Patient Instructions (Addendum)
Laura [name] , Thank you for taking time to come for your Medicare Wellness Visit. I appreciate your ongoing commitment to your health goals. Please review the following plan we discussed and let me know if I can assist you in the future.   Referrals/Orders/Follow-Ups/Clinician Recommendations: none  This is a list of the screening recommended for you and due dates:  Health Maintenance  Topic Date Due   Colon Cancer Screening  06/15/2024*   Hemoglobin A1C  12/17/2023   Yearly kidney function blood test for diabetes  06/15/2024   Yearly kidney health urinalysis for diabetes  06/15/2024   Complete foot exam   06/15/2024   Eye exam for diabetics  07/10/2024   Medicare Annual Wellness Visit  08/28/2024   DTaP/Tdap/Td vaccine (2 - Td or Tdap) 02/01/2033   Pneumonia Vaccine  Completed   Flu Shot  Completed   DEXA scan (bone density measurement)  Completed   COVID-19 Vaccine  Completed   Hepatitis C Screening  Completed   Zoster (Shingles) Vaccine  Completed   HPV Vaccine  Aged Out   Mammogram  Discontinued  *Topic was postponed. The date shown is not the original due date.    Advanced directives: (ACP Link)Information on Advanced Care Planning can be found at [facility] Directives Advance Health Care Directives ([email or web address]   Next Medicare Annual Wellness Visit scheduled for next year: Yes   09/03/24 @ 1:45 pm in person

## 2023-08-29 NOTE — Progress Notes (Signed)
Subjective:   Rhen Marck is a 72 y.o. female who presents for Medicare Annual (Subsequent) preventive examination.  Visit Complete: In person Cardiac Risk Factors include: advanced age (>13men, >87 women);diabetes mellitus;dyslipidemia;hypertension     Objective:    Today's Vitals   08/29/23 1343  BP: (!) 102/58  Weight: 143 lb 9.6 oz (65.1 kg)  Height: 5\' 8"  (1.727 m)   Body mass index is 21.83 kg/m.     08/29/2023    1:48 PM 12/01/2021    8:02 AM 11/10/2021    6:40 AM 08/19/2021    3:16 PM 08/11/2021    3:34 PM 07/06/2021    1:15 PM 07/05/2021    9:13 AM  Advanced Directives  Does Patient Have a Medical Advance Directive? No No No Yes Yes No No  Type of Ecologist of Attorney    Copy of Healthcare Power of Attorney in Chart?    No - copy requested No - copy requested    Would patient like information on creating a medical advance directive? No - Patient declined No - Patient declined No - Patient declined    Yes (Inpatient - patient requests chaplain consult to create a medical advance directive);No - Patient declined    Current Medications (verified) Outpatient Encounter Medications as of 08/29/2023  Medication Sig   ACCU-CHEK GUIDE test strip USE AS INSTRUCTED   Accu-Chek Softclix Lancets lancets Use twice a day   amLODipine (NORVASC) 5 MG tablet TAKE 1 TABLET (5 MG TOTAL) BY MOUTH DAILY.   aspirin 81 MG EC tablet Take 1 tablet (81 mg total) by mouth daily. Swallow whole.   atorvastatin (LIPITOR) 80 MG tablet TAKE 1 TABLET BY MOUTH EVERY DAY IN THE EVENING   Cholecalciferol (VITAMIN D-3 PO) Take by mouth daily.   cyanocobalamin 1000 MCG tablet Take 1 tablet (1,000 mcg total) by mouth daily.   metFORMIN (GLUCOPHAGE) 500 MG tablet TAKE 1 TABLET BY MOUTH TWICE A DAY WITH FOOD   No facility-administered encounter medications on file as of 08/29/2023.    Allergies (verified) Patient has no known allergies.    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   Stroke (HCC) 05/23/2021   Past Surgical History:  Procedure Laterality Date   CATARACT EXTRACTION W/PHACO Right 11/10/2021   Procedure: CATARACT EXTRACTION PHACO AND INTRAOCULAR LENS PLACEMENT (IOC) RIGHT DIABETIC  22.44 02:07.2;  Surgeon: Lockie Mola, MD;  Location: [facility];  Service: Ophthalmology;  Laterality: Right;   CATARACT EXTRACTION W/PHACO Left 12/01/2021   Procedure: CATARACT EXTRACTION PHACO AND INTRAOCULAR LENS PLACEMENT (IOC) LEFT DIABETIC 3.10 00:41.8;  Surgeon: Lockie Mola, MD;  Location: [facility];  Service: Ophthalmology;  Laterality: Left;   FOOT SURGERY     FRACTURE SURGERY  ?   Foot fracture, no surgery   TONSILLECTOMY     Family History  Problem Relation Age of Onset   Hypertension Mother    Diabetes Mother    Arthritis Mother    Heart disease Mother    Heart failure Father    Stroke Father    COPD Father    Hypertension Sister    Arthritis Sister  Heart disease Maternal Grandfather    Heart disease Maternal Grandmother    Stroke Paternal Grandfather    Stroke Paternal Grandmother    Arthritis Sister    Hypertension Sister    Cancer Maternal Aunt    Diabetes Maternal Aunt    Cancer Paternal Aunt    Hypertension Sister    Varicose Veins Sister    Social History   Socioeconomic History   Marital status: Single    Spouse name: Not on file   Number of children: 0   Years of education: Not on file   Highest education level: Not on file  Occupational History   Occupation: Retired  Tobacco Use   Smoking status: Never    Passive exposure: Never    Smokeless tobacco: Never  Vaping Use   Vaping status: Never Used  Substance and Sexual Activity   Alcohol use: Never   Drug use: Never   Sexual activity: Not Currently    Birth control/protection: None  Other Topics Concern   Not on file  Social History Narrative   Not on file   Social Determinants of Health   Financial Resource Strain: Low Risk  (08/29/2023)   Overall Financial Resource Strain (CARDIA)    Difficulty of Paying Living Expenses: Not hard at all  Food Insecurity: No Food Insecurity (08/29/2023)   Hunger Vital Sign    Worried About Running Out of Food in the Last Year: Never true    Ran Out of Food in the Last Year: Never true  Transportation Needs: No Transportation Needs (08/29/2023)   PRAPARE - Administrator, Civil Service (Medical): No    Lack of Transportation (Non-Medical): No  Physical Activity: Insufficiently Active (08/29/2023)   Exercise Vital Sign    Days of Exercise per Week: 5 days    Minutes of Exercise per Session: 20 min  Stress: No Stress Concern Present (08/29/2023)   [employer] of Occupational Health - Occupational Stress Questionnaire    Feeling of Stress : Not at all  Social Connections: Socially Isolated (08/29/2023)   Social Connection and Isolation Panel [NHANES]    Frequency of Communication with Friends and Family: Twice a week    Frequency of Social Gatherings with Friends and Family: Twice a week    Attends Religious Services: Never    Database administrator or Organizations: No    Attends Engineer, structural: Never    Marital Status: Never married    Tobacco Counseling Counseling given: Not Answered   Clinical Intake:  Pre-visit preparation completed: Yes  Pain : No/denies pain     Nutritional Status: BMI of 19-24  Normal Nutritional Risks: None Diabetes: Yes CBG done?: No Did pt. bring in CBG monitor from home?: No  How often do you need to have someone help you when you read instructions,  pamphlets, or other written materials from your doctor or pharmacy?: 1 - Never  Interpreter Needed?: No  Information entered by :: Kennedy Bucker, LPN   Activities of Daily Living    08/29/2023    1:50 PM  In your present state of health, do you have any difficulty performing the following activities:  Hearing? 0  Vision? 0  Difficulty concentrating or making decisions? 0  Walking or climbing stairs? 0  Dressing or bathing? 0  Doing errands, shopping? 0  Preparing Food and eating ? N  Using the Toilet? N  In the past six months, have you accidently leaked urine? N  Do  you have problems with loss of bowel control? N  Managing your Medications? N  Managing your Finances? N  Housekeeping or managing your Housekeeping? N    Patient Care Team: Reubin Milan, MD as PCP - General (Internal Medicine) Lockie Mola, MD as Referring Physician (Ophthalmology) Christia Reading, MD as Consulting Physician (Otolaryngology)  Indicate any recent Medical Services you may have received from other than [facility] providers in the past year (date may be approximate).     Assessment:   This is a routine wellness examination for Casonya.  Hearing/Vision screen Hearing Screening - Comments:: Wears aids Vision Screening - Comments:: No glasses= Dr.Brasington   Goals Addressed             This Visit's Progress    DIET - EAT MORE FRUITS AND VEGETABLES         Depression Screen    08/29/2023    1:46 PM 06/16/2023    9:35 AM 10/10/2022    2:41 PM 08/19/2022    2:20 PM 06/08/2022    1:51 PM 02/04/2022    1:42 PM 12/20/2021    8:56 AM  PHQ 2/9 Scores  PHQ - 2 Score 0 0 0 0 0 0 0  PHQ- 9 Score 0 0 0  0 0     Fall Risk    08/29/2023    1:49 PM 06/16/2023    9:35 AM 10/10/2022    2:42 PM 08/19/2022    2:22 PM 06/08/2022    1:51 PM  Fall Risk   Falls in the past year? 0 0 0 0 0  Number falls in past yr: 0 0 0 0 0  Injury with Fall? 0 0 0 0 0  Risk for fall due to : No Fall Risks No Fall  Risks No Fall Risks No Fall Risks No Fall Risks  Follow up Falls prevention discussed;Falls evaluation completed Falls evaluation completed Falls evaluation completed Falls evaluation completed Falls evaluation completed    MEDICARE RISK AT HOME: Medicare Risk at Home Any stairs in or around the home?: No If so, are there any without handrails?: No Home free of loose throw rugs in walkways, pet beds, electrical cords, etc?: Yes Adequate lighting in your home to reduce risk of falls?: Yes Life alert?: No Use of a cane, walker or w/c?: No Grab bars in the bathroom?: Yes Shower chair or bench in shower?: Yes Elevated toilet seat or a handicapped toilet?: No  TIMED UP AND GO:  Was the test performed?  Yes  Length of time to ambulate 10 feet: 4 sec Gait steady and fast without use of assistive device    Cognitive Function:        08/29/2023    1:51 PM 08/19/2022    2:25 PM  6CIT Screen  What Year? 0 points 0 points  What month? 0 points 0 points  What time? 0 points 0 points  Count back from 20 0 points 0 points  Months in reverse 0 points 0 points  Repeat phrase 0 points 2 points  Total Score 0 points 2 points    Immunizations Immunization History  Administered Date(s) Administered   Fluad Quad(high Dose 65+) 08/19/2022   Influenza, High Dose Seasonal PF 08/17/2023   Influenza-Unspecified 10/09/2021   PFIZER Comirnaty(Gray Top)Covid-19 Tri-Sucrose Vaccine 09/23/2021, 10/14/2021, 12/18/2021   PNEUMOCOCCAL CONJUGATE-20 07/26/2021   Pfizer(Comirnaty)Fall Seasonal Vaccine 12 years and older 08/10/2023   Respiratory Syncytial Virus Vaccine,Recomb Aduvanted(Arexvy) 09/22/2022   Tdap 02/01/2023   Unspecified  SARS-COV-2 Vaccination 09/10/2022   Zoster Recombinant(Shingrix) 09/29/2022, 01/12/2023    TDAP status: Up to date  Flu Vaccine status: Up to date  Pneumococcal vaccine status: Up to date  Covid-19 vaccine status: Completed vaccines  Qualifies for Shingles  Vaccine? Yes   Zostavax completed No   Shingrix Completed?: Yes  Screening Tests Health Maintenance  Topic Date Due   Colonoscopy  06/15/2024 (Originally 01/25/1997)   HEMOGLOBIN A1C  12/17/2023   Diabetic kidney evaluation - eGFR measurement  06/15/2024   Diabetic kidney evaluation - Urine ACR  06/15/2024   FOOT EXAM  06/15/2024   OPHTHALMOLOGY EXAM  07/10/2024   Medicare Annual Wellness (AWV)  08/28/2024   DTaP/Tdap/Td (2 - Td or Tdap) 02/01/2033   Pneumonia Vaccine 3+ Years old  Completed   INFLUENZA VACCINE  Completed   DEXA SCAN  Completed   COVID-19 Vaccine  Completed   Hepatitis C Screening  Completed   Zoster Vaccines- Shingrix  Completed   HPV VACCINES  Aged Out   MAMMOGRAM  Discontinued    Health Maintenance  There are no preventive care reminders to display for this patient.   Declined referral for colonoscopy, mammogram    Bone Density status: Completed 09/02/21. Results reflect: Bone density results: OSTEOPENIA. Repeat every 5 years.  Lung Cancer Screening: (Low Dose CT Chest recommended if Age 24-80 years, 20 pack-year currently smoking OR have quit w/in 15years.) does not qualify.    Additional Screening:  Hepatitis C Screening: does qualify; Completed 07/26/21  Vision Screening: Recommended annual ophthalmology exams for early detection of glaucoma and other disorders of the eye. Is the patient up to date with their annual eye exam?  Yes  Who is the provider or what is the name of the office in which the patient attends annual eye exams? Dr.Brasington If pt is not established with a provider, would they like to be referred to a provider to establish care? No .   Dental Screening: Recommended annual dental exams for proper oral hygiene  Diabetic Foot Exam: Diabetic Foot Exam: Completed 06/16/23  Community Resource Referral / Chronic Care Management: CRR required this visit?  No   CCM required this visit?  No     Plan:     I have personally  reviewed and noted the following in the patient's chart:   Medical and social history Use of alcohol, tobacco or illicit drugs  Current medications and supplements including opioid prescriptions. Patient is not currently taking opioid prescriptions. Functional ability and status Nutritional status Physical activity Advanced directives List of other physicians Hospitalizations, surgeries, and ER visits in previous 12 months Vitals Screenings to include cognitive, depression, and falls Referrals and appointments  In addition, I have reviewed and discussed with patient certain preventive protocols, quality metrics, and best practice recommendations. A written personalized care plan for preventive services as well as general preventive health recommendations were provided to patient.     Hal Hope, LPN   1/61/0960   After Visit Summary: my chart  Nurse Notes: none

## 2023-10-18 ENCOUNTER — Ambulatory Visit (INDEPENDENT_AMBULATORY_CARE_PROVIDER_SITE_OTHER): Payer: Medicare Other | Admitting: Internal Medicine

## 2023-10-18 ENCOUNTER — Encounter: Payer: Self-pay | Admitting: Internal Medicine

## 2023-10-18 VITALS — BP 118/62 | HR 93 | Ht 68.0 in | Wt 143.0 lb

## 2023-10-18 DIAGNOSIS — Z7984 Long term (current) use of oral hypoglycemic drugs: Secondary | ICD-10-CM | POA: Diagnosis not present

## 2023-10-18 DIAGNOSIS — E118 Type 2 diabetes mellitus with unspecified complications: Secondary | ICD-10-CM | POA: Diagnosis not present

## 2023-10-18 DIAGNOSIS — I10 Essential (primary) hypertension: Secondary | ICD-10-CM

## 2023-10-18 LAB — POCT GLYCOSYLATED HEMOGLOBIN (HGB A1C): Hemoglobin A1C: 6.1 % — AB (ref 4.0–5.6)

## 2023-10-18 MED ORDER — AMLODIPINE BESYLATE 5 MG PO TABS
5.0000 mg | ORAL_TABLET | Freq: Every day | ORAL | 1 refills | Status: DC
Start: 2023-10-18 — End: 2024-02-12

## 2023-10-18 NOTE — Assessment & Plan Note (Addendum)
Blood sugars stable without hypoglycemic symptoms or events. Currently managed with metformin and diet. Changes made last visit are none. Lab Results  Component Value Date   HGBA1C 6.3 (H) 06/16/2023  A1C today = 6.1 Reduce metformin to once per day to avoid hypoglycemia.  Monitor BS and call if persistently > 120.

## 2023-10-18 NOTE — Progress Notes (Signed)
Date:  10/18/2023   Name:  Laura [name]   DOB:  08-Jun-1951   MRN:  [id number]   Chief Complaint: Diabetes and Hypertension  Diabetes She presents for her follow-up diabetic visit. She has type 2 diabetes mellitus. Pertinent negatives for hypoglycemia include no dizziness, headaches or nervousness/anxiousness. Pertinent negatives for diabetes include no chest pain, no fatigue and no weakness. Diabetic complications include a CVA. Current diabetic treatment includes oral agent (monotherapy) (metformin).  Hypertension This is a chronic problem. The problem is controlled. Pertinent negatives include no chest pain, headaches, palpitations or shortness of breath. Past treatments include calcium channel blockers. The current treatment provides significant improvement. Hypertensive end-organ damage includes CVA. There is no history of kidney disease or CAD/MI.    Review of Systems  Constitutional:  Negative for fatigue and unexpected weight change.  Eyes:  Negative for visual disturbance.  Respiratory:  Negative for cough, chest tightness, shortness of breath and wheezing.   Cardiovascular:  Negative for chest pain, palpitations and leg swelling.  Gastrointestinal:  Negative for abdominal pain, constipation and diarrhea.  Neurological:  Negative for dizziness, weakness, light-headedness and headaches.  Psychiatric/Behavioral:  Negative for dysphoric mood and sleep disturbance. The patient is not nervous/anxious.      Lab Results  Component Value Date   NA 144 06/16/2023   K 4.6 06/16/2023   CO2 25 06/16/2023   GLUCOSE 105 (H) 06/16/2023   BUN 19 06/16/2023   CREATININE 0.92 06/16/2023   CALCIUM 10.2 06/16/2023   EGFR 67 06/16/2023   GFRNONAA >60 05/31/2021   Lab Results  Component Value Date   CHOL 90 (L) 06/16/2023   HDL 39 (L) 06/16/2023   LDLCALC 35 06/16/2023   LDLDIRECT 37 10/10/2022   TRIG 75 06/16/2023   CHOLHDL 2.3 06/16/2023   Lab Results  Component Value Date   TSH  4.640 (H) 06/16/2023   Lab Results  Component Value Date   HGBA1C 6.1 (A) 10/18/2023   Lab Results  Component Value Date   WBC 6.4 06/16/2023   HGB 12.4 06/16/2023   HCT 36.2 06/16/2023   MCV 83 06/16/2023   PLT 303 06/16/2023   Lab Results  Component Value Date   ALT 26 06/16/2023   AST 25 06/16/2023   ALKPHOS 72 06/16/2023   BILITOT 0.4 06/16/2023   Lab Results  Component Value Date   VD25OH 114.0 (H) 02/04/2022     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) 06/02/2021   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 (CVA) 06/02/2021   Vitamin D deficiency 06/02/2021   Type II diabetes mellitus with complication (HCC)    Vitamin B12 deficiency    Essential hypertension 05/23/2021    No Known Allergies  Past Surgical History:  Procedure Laterality Date   CATARACT EXTRACTION W/PHACO Right 11/10/2021   Procedure: CATARACT EXTRACTION PHACO AND INTRAOCULAR LENS PLACEMENT (IOC) RIGHT DIABETIC  22.44 02:07.2;  Surgeon: Lockie Mola, MD;  Location: [facility];  Service: Ophthalmology;  Laterality: Right;   CATARACT EXTRACTION W/PHACO Left 12/01/2021   Procedure: CATARACT EXTRACTION PHACO AND INTRAOCULAR LENS PLACEMENT (IOC) LEFT DIABETIC 3.10 00:41.8;  Surgeon: Lockie Mola, MD;  Location: [facility];  Service: Ophthalmology;  Laterality: Left;   FOOT SURGERY     FRACTURE SURGERY  ?   Foot  fracture, no surgery   TONSILLECTOMY      Social History   Tobacco Use   Smoking status: Never    Passive exposure: Never   Smokeless tobacco: Never  Vaping Use   Vaping status: Never Used  Substance Use Topics   Alcohol use: Never   Drug use: Never     Medication list has been reviewed and updated.  Current Meds  Medication Sig    ACCU-CHEK GUIDE test strip USE AS INSTRUCTED   Accu-Chek Softclix Lancets lancets Use twice a day   aspirin 81 MG EC tablet Take 1 tablet (81 mg total) by mouth daily. Swallow whole.   atorvastatin (LIPITOR) 80 MG tablet TAKE 1 TABLET BY MOUTH EVERY DAY IN THE EVENING   Cholecalciferol (VITAMIN D-3 PO) Take by mouth daily.   cyanocobalamin 1000 MCG tablet Take 1 tablet (1,000 mcg total) by mouth daily.   metFORMIN (GLUCOPHAGE) 500 MG tablet TAKE 1 TABLET BY MOUTH TWICE A DAY WITH FOOD   [DISCONTINUED] amLODipine (NORVASC) 5 MG tablet TAKE 1 TABLET (5 MG TOTAL) BY MOUTH DAILY.       06/16/2023    9:35 AM 10/10/2022    2:42 PM 06/08/2022    1:51 PM 02/04/2022    1:42 PM  GAD 7 : Generalized Anxiety Score  Nervous, Anxious, on Edge 0 0 0 0  Control/stop worrying 0 0 0 0  Worry too much - different things 0 0 0 0  Trouble relaxing 0 0 0 0  Restless 0 0 0 0  Easily annoyed or irritable 0 0 0 0  Afraid - awful might happen 0 0 0 0  Total GAD 7 Score 0 0 0 0  Anxiety Difficulty Not difficult at all Not difficult at all Not difficult at all Not difficult at all       08/29/2023    1:46 PM 06/16/2023    9:35 AM 10/10/2022    2:41 PM  Depression screen PHQ 2/9  Decreased Interest 0 0 0  Down, Depressed, Hopeless 0 0 0  PHQ - 2 Score 0 0 0  Altered sleeping 0 0 0  Tired, decreased energy 0 0 0  Change in appetite 0 0 0  Feeling bad or failure about yourself  0 0 0  Trouble concentrating 0 0 0  Moving slowly or fidgety/restless 0 0 0  Suicidal thoughts 0 0 0  PHQ-9 Score 0 0 0  Difficult doing work/chores Not difficult at all Not difficult at all Not difficult at all    BP Readings from Last 3 Encounters:  10/18/23 118/62  08/29/23 (!) 102/58  06/16/23 106/70    Physical Exam Vitals and nursing note reviewed.  Constitutional:      General: She is not in acute distress.    Appearance: She is well-developed.  HENT:     Head: Normocephalic and atraumatic.  Neck:     Vascular: No  carotid bruit.  Cardiovascular:     Rate and Rhythm: Normal rate and regular rhythm.  Pulmonary:     Effort: Pulmonary effort is normal. No respiratory distress.     Breath sounds: No wheezing or rhonchi.  Musculoskeletal:     Cervical back: Normal range of motion.     Right lower leg: No edema.     Left lower leg: No edema.  Lymphadenopathy:     Cervical: No cervical adenopathy.  Skin:    General: Skin is warm and dry.     Findings: No  rash.  Neurological:     Mental Status: She is alert and oriented to person, place, and time. Mental status is at baseline.     Comments: Mild dysarthria s/p CVA  Psychiatric:        Mood and Affect: Mood normal.        Behavior: Behavior normal.     Wt Readings from Last 3 Encounters:  10/18/23 143 lb (64.9 kg)  08/29/23 143 lb 9.6 oz (65.1 kg)  06/16/23 139 lb (63 kg)    BP 118/62   Pulse 93   Ht 5\' 8"  (1.727 m)   Wt 143 lb (64.9 kg)   SpO2 98%   BMI 21.74 kg/m   Assessment and Plan:  Problem List Items Addressed This Visit       Unprioritized   Essential hypertension (Chronic)    Controlled BP with normal exam. Current regimen is amlodipine. Will continue same medications; encourage continued reduced sodium diet.        Relevant Medications   amLODipine (NORVASC) 5 MG tablet   Type II diabetes mellitus with complication (HCC) - Primary (Chronic)    Blood sugars stable without hypoglycemic symptoms or events. Currently managed with metformin and diet. Changes made last visit are none. Lab Results  Component Value Date   HGBA1C 6.3 (H) 06/16/2023  A1C today = 6.1 Reduce metformin to once per day to avoid hypoglycemia.  Monitor BS and call if persistently > 120.       Relevant Orders   POCT glycosylated hemoglobin (Hb A1C) (Completed)   Other Visit Diagnoses     Long term current use of oral hypoglycemic drug           Return in about 4 months (around 02/15/2024) for DM, HTN.    Reubin Milan, MD [facility] [place]

## 2023-10-18 NOTE — Assessment & Plan Note (Signed)
Controlled BP with normal exam. Current regimen is amlodipine. Will continue same medications; encourage continued reduced sodium diet.

## 2023-10-18 NOTE — Patient Instructions (Signed)
Decrease metformin dose to one tablet in the am only.  Monitor blood sugars and call if they are generally greater than 120.

## 2023-12-01 ENCOUNTER — Other Ambulatory Visit: Payer: Self-pay | Admitting: Internal Medicine

## 2023-12-06 NOTE — Telephone Encounter (Signed)
 Change of pharmacy Requested Prescriptions  Pending Prescriptions Disp Refills   atorvastatin  (LIPITOR ) 80 MG tablet [Pharmacy Med Name: ATORVASTATIN  80 MG TABLET] 90 tablet 0    Sig: TAKE 1 TABLET BY MOUTH EVERY DAY IN THE EVENING     Cardiovascular:  Antilipid - Statins Failed - 12/06/2023  9:43 AM      Failed - Lipid Panel in normal range within the last 12 months    Cholesterol, Total  Date Value Ref Range Status  06/16/2023 90 (L) 100 - 199 mg/dL Final   LDL Chol Calc (NIH)  Date Value Ref Range Status  06/16/2023 35 0 - 99 mg/dL Final   LDL Direct  Date Value Ref Range Status  10/10/2022 37 0 - 99 mg/dL Final   HDL  Date Value Ref Range Status  06/16/2023 39 (L) >39 mg/dL Final   Triglycerides  Date Value Ref Range Status  06/16/2023 75 0 - 149 mg/dL Final         Passed - Patient is not pregnant      Passed - Valid encounter within last 12 months    Recent Outpatient Visits           1 month ago Type II diabetes mellitus with complication [facility])   [place] [facility] at [facility], Leita DEL, MD   5 months ago Essential hypertension   [place] [facility] at [facility], Leita DEL, MD   10 months ago Essential hypertension   [place] [facility] at [facility], Leita DEL, MD   1 year ago Essential hypertension   [place] [facility] at [facility], Leita DEL, MD   1 year ago Essential hypertension   [place] [facility] at [facility], Leita DEL, MD       Future Appointments             In 2 months Just[place], Leita DEL, MD [facility] at [facility], [facility]

## 2024-02-10 ENCOUNTER — Other Ambulatory Visit: Payer: Self-pay | Admitting: Internal Medicine

## 2024-02-10 DIAGNOSIS — I10 Essential (primary) hypertension: Secondary | ICD-10-CM

## 2024-02-10 DIAGNOSIS — E118 Type 2 diabetes mellitus with unspecified complications: Secondary | ICD-10-CM

## 2024-02-12 NOTE — Telephone Encounter (Signed)
 Requested Prescriptions  Pending Prescriptions Disp Refills   amLODipine (NORVASC) 5 MG tablet [Pharmacy Med Name: AMLODIPINE BESYLATE 5 MG TAB] 90 tablet 0    Sig: TAKE 1 TABLET (5 MG TOTAL) BY MOUTH DAILY.     Cardiovascular: Calcium Channel Blockers 2 Passed - 02/12/2024  3:26 PM      Passed - Last BP in normal range    BP Readings from Last 1 Encounters:  10/18/23 118/62         Passed - Last Heart Rate in normal range    Pulse Readings from Last 1 Encounters:  10/18/23 93         Passed - Valid encounter within last 6 months    Recent Outpatient Visits           3 months ago Type II diabetes mellitus with complication [facility])   [place] [facility] at [facility], Nyoka Cowden, MD   8 months ago Essential hypertension   [place] [facility] at [facility], Nyoka Cowden, MD   1 year ago Essential hypertension   [place] [facility] at [facility], Nyoka Cowden, MD   1 year ago Essential hypertension   [place] [facility] at [facility], Nyoka Cowden, MD   1 year ago Essential hypertension   [place] [facility] at [facility], Nyoka Cowden, MD       Future Appointments             In 4 days Judithann Graves Nyoka Cowden, MD [facility] at [facility] [place], [facility]             glucose blood (ACCU-CHEK GUIDE TEST) test strip [Pharmacy Med Name: ACCU-CHEK GUIDE TEST STRIP] 100 strip     Sig: USE AS INSTRUCTED     There is no refill protocol information for this order

## 2024-02-12 NOTE — Telephone Encounter (Signed)
 Requested medication (s) are due for refill today: yes  Requested medication (s) are on the active medication list: yes  Last refill:  08/08/23  #100 1 RF  Future visit scheduled: yes  Notes to clinic:  this rx has no refill protocol.  Requested Prescriptions  Pending Prescriptions Disp Refills   glucose blood (ACCU-CHEK GUIDE TEST) test strip [Pharmacy Med Name: ACCU-CHEK GUIDE TEST STRIP] 100 strip     Sig: USE AS INSTRUCTED     There is no refill protocol information for this order    Signed Prescriptions Disp Refills   amLODipine (NORVASC) 5 MG tablet 90 tablet 0    Sig: TAKE 1 TABLET (5 MG TOTAL) BY MOUTH DAILY.     Cardiovascular: Calcium Channel Blockers 2 Passed - 02/12/2024  3:27 PM      Passed - Last BP in normal range    BP Readings from Last 1 Encounters:  10/18/23 118/62         Passed - Last Heart Rate in normal range    Pulse Readings from Last 1 Encounters:  10/18/23 93         Passed - Valid encounter within last 6 months    Recent Outpatient Visits           3 months ago Type II diabetes mellitus with complication [facility])   [place] [facility] at [facility], Nyoka Cowden, MD   8 months ago Essential hypertension   [place] [facility] at [facility], Nyoka Cowden, MD   1 year ago Essential hypertension   [place] [facility] at [facility], Nyoka Cowden, MD   1 year ago Essential hypertension   [place] [facility] at [facility], Nyoka Cowden, MD   1 year ago Essential hypertension   [place] [facility] at [facility], Nyoka Cowden, MD       Future Appointments             In 4 days Judithann Graves Nyoka Cowden, MD [facility] Primary Care & Sports Medicine at [facility], [facility]

## 2024-02-13 ENCOUNTER — Other Ambulatory Visit: Payer: Self-pay | Admitting: Internal Medicine

## 2024-02-13 DIAGNOSIS — E118 Type 2 diabetes mellitus with unspecified complications: Secondary | ICD-10-CM

## 2024-02-14 NOTE — Telephone Encounter (Signed)
 Pharmacy comment: Alternative Requested:SPECIFIC DURATION OF TESTING NEEDED.  ONCE A DAY IF NOT ON INSULIN.  UP TO THREE TIMES A DAY IF ON INSULIN.  TESTING FREQUENCY MANDATORY FOR PART B.

## 2024-02-16 ENCOUNTER — Encounter: Payer: Self-pay | Admitting: Internal Medicine

## 2024-02-16 ENCOUNTER — Ambulatory Visit (INDEPENDENT_AMBULATORY_CARE_PROVIDER_SITE_OTHER): Payer: Medicare Other | Admitting: Internal Medicine

## 2024-02-16 VITALS — BP 112/68 | HR 85 | Ht 68.0 in | Wt 144.2 lb

## 2024-02-16 DIAGNOSIS — E118 Type 2 diabetes mellitus with unspecified complications: Secondary | ICD-10-CM

## 2024-02-16 DIAGNOSIS — I10 Essential (primary) hypertension: Secondary | ICD-10-CM | POA: Diagnosis not present

## 2024-02-16 DIAGNOSIS — E1169 Type 2 diabetes mellitus with other specified complication: Secondary | ICD-10-CM | POA: Diagnosis not present

## 2024-02-16 DIAGNOSIS — E785 Hyperlipidemia, unspecified: Secondary | ICD-10-CM

## 2024-02-16 DIAGNOSIS — Z7984 Long term (current) use of oral hypoglycemic drugs: Secondary | ICD-10-CM | POA: Diagnosis not present

## 2024-02-16 DIAGNOSIS — R531 Weakness: Secondary | ICD-10-CM

## 2024-02-16 DIAGNOSIS — I69998 Other sequelae following unspecified cerebrovascular disease: Secondary | ICD-10-CM

## 2024-02-16 LAB — POCT GLYCOSYLATED HEMOGLOBIN (HGB A1C): Hemoglobin A1C: 6.1 % — AB (ref 4.0–5.6)

## 2024-02-16 NOTE — Assessment & Plan Note (Signed)
 Stable mild dysarthria. No other focal neurological symptoms Continue ASA and statin Labs next visit

## 2024-02-16 NOTE — Progress Notes (Signed)
 Date:  02/16/2024   Name:  Laura [name]   DOB:  July 17, 1951   MRN:  [id number]   Chief Complaint: Diabetes and Hypertension  Diabetes She presents for her follow-up diabetic visit. She has type 2 diabetes mellitus. Her disease course has been stable. Pertinent negatives for hypoglycemia include no headaches or tremors. Pertinent negatives for diabetes include no chest pain, no fatigue, no polydipsia and no polyuria. Current diabetic treatment includes oral agent (monotherapy). She is compliant with treatment all of the time.  Hypertension This is a chronic problem. The problem is controlled. Pertinent negatives include no chest pain, headaches, palpitations or shortness of breath. Past treatments include calcium channel blockers. The current treatment provides significant improvement.    Review of Systems  Constitutional:  Negative for appetite change, fatigue, fever and unexpected weight change.  HENT:  Negative for tinnitus and trouble swallowing.   Eyes:  Negative for visual disturbance.  Respiratory:  Negative for cough, chest tightness and shortness of breath.   Cardiovascular:  Negative for chest pain, palpitations and leg swelling.  Gastrointestinal:  Negative for abdominal pain.  Endocrine: Negative for polydipsia and polyuria.  Genitourinary:  Negative for dysuria and hematuria.  Musculoskeletal:  Negative for arthralgias.  Neurological:  Negative for tremors, numbness and headaches.  Psychiatric/Behavioral:  Negative for dysphoric mood.      Lab Results  Component Value Date   NA 144 06/16/2023   K 4.6 06/16/2023   CO2 25 06/16/2023   GLUCOSE 105 (H) 06/16/2023   BUN 19 06/16/2023   CREATININE 0.92 06/16/2023   CALCIUM 10.2 06/16/2023   EGFR 67 06/16/2023   GFRNONAA >60 05/31/2021   Lab Results  Component Value Date   CHOL 90 (L) 06/16/2023   HDL 39 (L) 06/16/2023   LDLCALC 35 06/16/2023   LDLDIRECT 37 10/10/2022   TRIG 75 06/16/2023   CHOLHDL 2.3 06/16/2023    Lab Results  Component Value Date   TSH 4.640 (H) 06/16/2023   Lab Results  Component Value Date   HGBA1C 6.1 (A) 02/16/2024   Lab Results  Component Value Date   WBC 6.4 06/16/2023   HGB 12.4 06/16/2023   HCT 36.2 06/16/2023   MCV 83 06/16/2023   PLT 303 06/16/2023   Lab Results  Component Value Date   ALT 26 06/16/2023   AST 25 06/16/2023   ALKPHOS 72 06/16/2023   BILITOT 0.4 06/16/2023   Lab Results  Component Value Date   VD25OH 114.0 (H) 02/04/2022     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) 06/02/2021   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 (CVA) 06/02/2021   Vitamin D deficiency 06/02/2021   Type II diabetes mellitus with complication (HCC)    Vitamin B12 deficiency    Essential hypertension 05/23/2021    No Known Allergies  Past Surgical History:  Procedure Laterality Date   CATARACT EXTRACTION W/PHACO Right 11/10/2021   Procedure: CATARACT EXTRACTION PHACO AND INTRAOCULAR LENS PLACEMENT (IOC) RIGHT DIABETIC  22.44 02:07.2;  Surgeon: Lockie Mola, MD;  Location: [facility];  Service: Ophthalmology;  Laterality: Right;   CATARACT EXTRACTION W/PHACO Left 12/01/2021   Procedure: CATARACT EXTRACTION PHACO AND INTRAOCULAR LENS PLACEMENT (IOC) LEFT DIABETIC 3.10 00:41.8;  Surgeon: Lockie Mola, MD;  Location: [facility];  Service: Ophthalmology;  Laterality: Left;   FOOT SURGERY     FRACTURE SURGERY  ?   Foot fracture, no surgery   TONSILLECTOMY      Social History   Tobacco Use   Smoking status: Never    Passive exposure: Never   Smokeless tobacco: Never  Vaping Use   Vaping status: Never Used  Substance Use Topics   Alcohol use: Never   Drug use: Never     Medication list has been reviewed and  updated.  Current Meds  Medication Sig   Accu-Chek Softclix Lancets lancets Use twice a day   amLODipine (NORVASC) 5 MG tablet TAKE 1 TABLET (5 MG TOTAL) BY MOUTH DAILY.   aspirin 81 MG EC tablet Take 1 tablet (81 mg total) by mouth daily. Swallow whole.   atorvastatin (LIPITOR) 80 MG tablet TAKE 1 TABLET BY MOUTH EVERY DAY IN THE EVENING   Cholecalciferol (VITAMIN D-3 PO) Take by mouth daily.   cyanocobalamin 1000 MCG tablet Take 1 tablet (1,000 mcg total) by mouth daily.   glucose blood (ACCU-CHEK GUIDE TEST) test strip USE AS INSTRUCTED   metFORMIN (GLUCOPHAGE) 500 MG tablet TAKE 1 TABLET BY MOUTH TWICE A DAY WITH FOOD (Patient taking differently: Take 500 mg by mouth daily with breakfast.)       02/16/2024   11:05 AM 06/16/2023    9:35 AM 10/10/2022    2:42 PM 06/08/2022    1:51 PM  GAD 7 : Generalized Anxiety Score  Nervous, Anxious, on Edge 0 0 0 0  Control/stop worrying 0 0 0 0  Worry too much - different things 0 0 0 0  Trouble relaxing 0 0 0 0  Restless 0 0 0 0  Easily annoyed or irritable 0 0 0 0  Afraid - awful might happen 0 0 0 0  Total GAD 7 Score 0 0 0 0  Anxiety Difficulty Not difficult at all Not difficult at all Not difficult at all Not difficult at all       02/16/2024   11:04 AM 08/29/2023    1:46 PM 06/16/2023    9:35 AM  Depression screen PHQ 2/9  Decreased Interest 0 0 0  Down, Depressed, Hopeless 0 0 0  PHQ - 2 Score 0 0 0  Altered sleeping 0 0 0  Tired, decreased energy 0 0 0  Change in appetite 0 0 0  Feeling bad or failure about yourself  0 0 0  Trouble concentrating 0 0 0  Moving slowly or fidgety/restless 0 0 0  Suicidal thoughts 0 0 0  PHQ-9 Score 0 0 0  Difficult doing work/chores Not difficult at all Not difficult at all Not difficult at all    BP Readings from Last 3 Encounters:  02/16/24 112/68  10/18/23 118/62  08/29/23 (!) 102/58    Physical Exam Vitals and nursing note reviewed.  Constitutional:      General: She is not in  acute distress.    Appearance: Normal appearance. She is well-developed.  HENT:     Head: Normocephalic and atraumatic.  Neck:     Vascular: No carotid bruit.  Cardiovascular:     Rate and Rhythm: Normal rate and regular rhythm.     Heart sounds: No murmur heard. Pulmonary:     Effort: Pulmonary effort is normal. No respiratory distress.     Breath sounds: No wheezing or rhonchi.  Musculoskeletal:     Cervical back: Normal range of motion.     Right lower leg:  No edema.     Left lower leg: No edema.  Lymphadenopathy:     Cervical: No cervical adenopathy.  Skin:    General: Skin is warm and dry.     Findings: No rash.  Neurological:     Mental Status: She is alert and oriented to person, place, and time. Mental status is at baseline.     Sensory: No sensory deficit.     Gait: Gait normal.     Deep Tendon Reflexes: Reflexes normal.  Psychiatric:        Mood and Affect: Mood normal.        Behavior: Behavior normal.     Wt Readings from Last 3 Encounters:  02/16/24 144 lb 4 oz (65.4 kg)  10/18/23 143 lb (64.9 kg)  08/29/23 143 lb 9.6 oz (65.1 kg)    BP 112/68   Pulse 85   Ht 5\' 8"  (1.727 m)   Wt 144 lb 4 oz (65.4 kg)   SpO2 98%   BMI 21.93 kg/m   Assessment and Plan:  Problem List Items Addressed This Visit       Unprioritized   Essential hypertension (Chronic)   Controlled BP with normal exam. Current regimen is amlodipine. Will continue same medications; encourage continued reduced sodium diet.       Type II diabetes mellitus with complication (HCC) - Primary (Chronic)   Blood sugars stable without hypoglycemic symptoms or events. Currently managed with MTF once a day. Changes made last visit are none. Lab Results  Component Value Date   HGBA1C 6.1 (A) 10/18/2023  A1c today = 6.1      Relevant Orders   POCT glycosylated hemoglobin (Hb A1C) (Completed)   Hyperlipidemia associated with type 2 diabetes mellitus (HCC) (Chronic)   Controlled on statin  therapy. 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 No other focal neurological symptoms Continue ASA and statin Labs next visit      Other Visit Diagnoses       Long term current use of oral hypoglycemic drug           Return in about 4 months (around 06/17/2024) for Medicare annual.    Reubin Milan, MD [facility] [place]

## 2024-02-16 NOTE — Assessment & Plan Note (Addendum)
 Blood sugars stable without hypoglycemic symptoms or events. Currently managed with MTF once a day. Changes made last visit are none. Lab Results  Component Value Date   HGBA1C 6.1 (A) 10/18/2023  A1c today = 6.1

## 2024-02-16 NOTE — Assessment & Plan Note (Signed)
 Controlled BP with normal exam. Current regimen is amlodipine. Will continue same medications; encourage continued reduced sodium diet.

## 2024-02-16 NOTE — Assessment & Plan Note (Signed)
 Controlled on statin therapy. Lab Results  Component Value Date   LDLCALC 35 06/16/2023

## 2024-02-29 ENCOUNTER — Other Ambulatory Visit: Payer: Self-pay | Admitting: Internal Medicine

## 2024-02-29 DIAGNOSIS — E118 Type 2 diabetes mellitus with unspecified complications: Secondary | ICD-10-CM

## 2024-03-01 NOTE — Telephone Encounter (Signed)
 Please review

## 2024-03-01 NOTE — Telephone Encounter (Signed)
 Requested medication (s) are due for refill today: review  Requested medication (s) are on the active medication list: yes  Last refill:  02/12/24  Future visit scheduled: yes  Notes to clinic:  Pharmacy comment: Alternative Requested:NEEDS 3 MONTHS SUPPLY FOR INSURANCE TO COVER.      Requested Prescriptions  Pending Prescriptions Disp Refills   ACCU-CHEK GUIDE TEST test strip [Pharmacy Med Name: ACCU-CHEK GUIDE TEST STRIP] 100 strip 3    Sig: USE AS INSTRUCTED     There is no refill protocol information for this order

## 2024-03-03 ENCOUNTER — Other Ambulatory Visit: Payer: Self-pay | Admitting: Internal Medicine

## 2024-03-03 DIAGNOSIS — E118 Type 2 diabetes mellitus with unspecified complications: Secondary | ICD-10-CM

## 2024-03-03 MED ORDER — ACCU-CHEK GUIDE TEST VI STRP: 1.0000 | ORAL_STRIP | Freq: Two times a day (BID) | 3 refills | Status: DC

## 2024-04-14 ENCOUNTER — Other Ambulatory Visit: Payer: Self-pay | Admitting: Internal Medicine

## 2024-04-14 DIAGNOSIS — E118 Type 2 diabetes mellitus with unspecified complications: Secondary | ICD-10-CM

## 2024-04-16 NOTE — Telephone Encounter (Signed)
 Requested Prescriptions  Pending Prescriptions Disp Refills   metFORMIN  (GLUCOPHAGE ) 500 MG tablet [Pharmacy Med Name: METFORMIN  HCL 500 MG TABLET] 180 tablet 1    Sig: TAKE 1 TABLET BY MOUTH TWICE A DAY WITH FOOD     Endocrinology:  Diabetes - Biguanides Failed - 04/16/2024 12:15 PM      Failed - B12 Level in normal range and within 720 days    Vitamin B-12  Date Value Ref Range Status  05/23/2021 97 (L) 180 - 914 pg/mL Final    Comment:    (NOTE) This assay is not validated for testing neonatal or myeloproliferative syndrome specimens for Vitamin B12 levels. Performed at [facility], [address], [place], [place] [address]          Passed - Cr in normal range and within 360 days    Creatinine, Ser  Date Value Ref Range Status  06/16/2023 0.92 0.57 - 1.00 mg/dL Final         Passed - HBA1C is between 0 and 7.9 and within 180 days    Hemoglobin A1C  Date Value Ref Range Status  02/16/2024 6.1 (A) 4.0 - 5.6 % Final   Hgb A1c MFr Bld  Date Value Ref Range Status  06/16/2023 6.3 (H) 4.8 - 5.6 % Final    Comment:             Prediabetes: 5.7 - 6.4          Diabetes: >6.4          Glycemic control for adults with diabetes: <7.0          Passed - eGFR in normal range and within 360 days    GFR, Estimated  Date Value Ref Range Status  05/31/2021 >60 >60 mL/min Final    Comment:    (NOTE) Calculated using the CKD-EPI Creatinine Equation (2021)    eGFR  Date Value Ref Range Status  06/16/2023 67 >59 mL/min/1.73 Final         Passed - Valid encounter within last 6 months    Recent Outpatient Visits           2 months ago Type II diabetes mellitus with complication [facility])   [place] [facility] at [facility], Chales [place], MD       Future Appointments             In 2 months Gala Jubilee Chales [place], MD [facility] at [facility], [facility]            Passed - CBC within normal limits and  completed in the last 12 months    WBC  Date Value Ref Range Status  06/16/2023 6.4 3.4 - 10.8 x10E3/uL Final  05/31/2021 6.9 4.0 - 10.5 K/uL Final   RBC  Date Value Ref Range Status  06/16/2023 4.39 3.77 - 5.28 x10E6/uL Final  05/31/2021 4.68 3.87 - 5.11 MIL/uL Final   Hemoglobin  Date Value Ref Range Status  06/16/2023 12.4 11.1 - 15.9 g/dL Final   Hematocrit  Date Value Ref Range Status  06/16/2023 36.2 34.0 - 46.6 % Final   MCHC  Date Value Ref Range Status  06/16/2023 34.3 31.5 - 35.7 g/dL Final  19/14/7829 56.2 30.0 - 36.0 g/dL Final   [facility]  Date Value Ref Range Status  06/16/2023 28.2 26.6 - 33.0 pg Final  05/31/2021 28.6 26.0 - 34.0 pg Final   MCV  Date  Value Ref Range Status  06/16/2023 83 79 - 97 fL Final   No results found for: "PLTCOUNTKUC", "LABPLAT", "POCPLA" RDW  Date Value Ref Range Status  06/16/2023 11.9 11.7 - 15.4 % Final

## 2024-06-01 ENCOUNTER — Other Ambulatory Visit: Payer: Self-pay | Admitting: Internal Medicine

## 2024-06-03 NOTE — Telephone Encounter (Signed)
 Requested Prescriptions  Pending Prescriptions Disp Refills   atorvastatin  (LIPITOR ) 80 MG tablet [Pharmacy Med Name: ATORVASTATIN  80 MG TABLET] 90 tablet 0    Sig: TAKE 1 TABLET BY MOUTH EVERY DAY IN THE EVENING     Cardiovascular:  Antilipid - Statins Failed - 06/03/2024  4:26 PM      Failed - Lipid Panel in normal range within the last 12 months    Cholesterol, Total  Date Value Ref Range Status  06/16/2023 90 (L) 100 - 199 mg/dL Final   LDL Chol Calc (NIH)  Date Value Ref Range Status  06/16/2023 35 0 - 99 mg/dL Final   LDL Direct  Date Value Ref Range Status  10/10/2022 37 0 - 99 mg/dL Final   HDL  Date Value Ref Range Status  06/16/2023 39 (L) >39 mg/dL Final   Triglycerides  Date Value Ref Range Status  06/16/2023 75 0 - 149 mg/dL Final         Passed - Patient is not pregnant      Passed - Valid encounter within last 12 months    Recent Outpatient Visits           3 months ago Type II diabetes mellitus with complication [facility])   [place] [facility] at [facility], Leita DEL, MD       Future Appointments             In 2 weeks Justus, Leita DEL, MD [facility] Medicine at [facility], [facility]

## 2024-06-17 ENCOUNTER — Ambulatory Visit (INDEPENDENT_AMBULATORY_CARE_PROVIDER_SITE_OTHER): Admitting: Internal Medicine

## 2024-06-17 ENCOUNTER — Encounter: Payer: Self-pay | Admitting: Internal Medicine

## 2024-06-17 VITALS — BP 102/60 | HR 100 | Ht 68.0 in | Wt 140.0 lb

## 2024-06-17 DIAGNOSIS — Z1231 Encounter for screening mammogram for malignant neoplasm of breast: Secondary | ICD-10-CM

## 2024-06-17 DIAGNOSIS — E118 Type 2 diabetes mellitus with unspecified complications: Secondary | ICD-10-CM | POA: Diagnosis not present

## 2024-06-17 DIAGNOSIS — I69321 Dysphasia following cerebral infarction: Secondary | ICD-10-CM

## 2024-06-17 DIAGNOSIS — I10 Essential (primary) hypertension: Secondary | ICD-10-CM

## 2024-06-17 DIAGNOSIS — E1169 Type 2 diabetes mellitus with other specified complication: Secondary | ICD-10-CM | POA: Diagnosis not present

## 2024-06-17 DIAGNOSIS — Z7984 Long term (current) use of oral hypoglycemic drugs: Secondary | ICD-10-CM

## 2024-06-17 DIAGNOSIS — I69998 Other sequelae following unspecified cerebrovascular disease: Secondary | ICD-10-CM

## 2024-06-17 DIAGNOSIS — I6521 Occlusion and stenosis of right carotid artery: Secondary | ICD-10-CM | POA: Diagnosis not present

## 2024-06-17 DIAGNOSIS — Z1211 Encounter for screening for malignant neoplasm of colon: Secondary | ICD-10-CM

## 2024-06-17 DIAGNOSIS — E785 Hyperlipidemia, unspecified: Secondary | ICD-10-CM

## 2024-06-17 DIAGNOSIS — R531 Weakness: Secondary | ICD-10-CM

## 2024-06-17 NOTE — Progress Notes (Signed)
 Date:  06/17/2024   Name:  Laura [name]   DOB:  1951/04/15   MRN:  [id number]   Chief Complaint: Annual Exam Laura [name] is a 73 y.o. female who presents today for her Complete Annual Exam. She feels well. She reports exercising - some. She reports she is sleeping well. Breast complaints - none.  Health Maintenance  Topic Date Due   Colon Cancer Screening  Never done   COVID-19 Vaccine (6 - 2024-25 season) 02/07/2024   Yearly kidney function blood test for diabetes  06/15/2024   Yearly kidney health urinalysis for diabetes  06/15/2024   Flu Shot  07/05/2024   Eye exam for diabetics  07/10/2024   Hemoglobin A1C  08/18/2024   Medicare Annual Wellness Visit  08/28/2024   Complete foot exam   06/17/2025   DTaP/Tdap/Td vaccine (2 - Td or Tdap) 02/01/2033   Pneumococcal Vaccine for age over 60  Completed   DEXA scan (bone density measurement)  Completed   Hepatitis C Screening  Completed   Zoster (Shingles) Vaccine  Completed   Hepatitis B Vaccine  Aged Out   HPV Vaccine  Aged Out   Meningitis B Vaccine  Aged Out   Mammogram  Discontinued    Diabetes She presents for her follow-up diabetic visit. She has type 2 diabetes mellitus. Her disease course has been stable. Pertinent negatives for hypoglycemia include no dizziness or headaches. Pertinent negatives for diabetes include no chest pain, no fatigue and no weakness. Diabetic complications include a CVA. Current diabetic treatment includes oral agent (monotherapy) (MTF).  Hypertension This is a chronic problem. The problem is controlled. Pertinent negatives include no chest pain, headaches, palpitations or shortness of breath. Past treatments include calcium  channel blockers. Hypertensive end-organ damage includes CVA. There is no history of kidney disease or CAD/MI.  Hyperlipidemia This is a chronic problem. The problem is controlled. Pertinent negatives include no chest pain, myalgias or shortness of breath. Current  antihyperlipidemic treatment includes statins. The current treatment provides significant improvement of lipids.    Review of Systems  Constitutional:  Negative for fatigue and unexpected weight change.  HENT:  Negative for trouble swallowing.   Eyes:  Negative for visual disturbance.  Respiratory:  Negative for cough, chest tightness, shortness of breath and wheezing.   Cardiovascular:  Negative for chest pain, palpitations and leg swelling.  Gastrointestinal:  Negative for abdominal pain, constipation and diarrhea.  Musculoskeletal:  Negative for arthralgias and myalgias.  Neurological:  Negative for dizziness, weakness, light-headedness and headaches.     Lab Results  Component Value Date   NA 144 06/16/2023   K 4.6 06/16/2023   CO2 25 06/16/2023   GLUCOSE 105 (H) 06/16/2023   BUN 19 06/16/2023   CREATININE 0.92 06/16/2023   CALCIUM  10.2 06/16/2023   EGFR 67 06/16/2023   GFRNONAA >60 05/31/2021   Lab Results  Component Value Date   CHOL 90 (L) 06/16/2023   HDL 39 (L) 06/16/2023   LDLCALC 35 06/16/2023   LDLDIRECT 37 10/10/2022   TRIG 75 06/16/2023   CHOLHDL 2.3 06/16/2023   Lab Results  Component Value Date   TSH 4.640 (H) 06/16/2023   Lab Results  Component Value Date   HGBA1C 6.1 (A) 02/16/2024   Lab Results  Component Value Date   WBC 6.4 06/16/2023   HGB 12.4 06/16/2023   HCT 36.2 06/16/2023   MCV 83 06/16/2023   PLT 303 06/16/2023   Lab Results  Component Value Date  ALT 26 06/16/2023   AST 25 06/16/2023   ALKPHOS 72 06/16/2023   BILITOT 0.4 06/16/2023   Lab Results  Component Value Date   VD25OH 114.0 (H) 02/04/2022   Lab Results  Component Value Date   VITAMINB12 97 (L) 05/23/2021     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) 06/02/2021    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 (CVA) 06/02/2021   Vitamin D  deficiency 06/02/2021   Type II diabetes mellitus with complication (HCC)    Vitamin B12 deficiency    Essential hypertension 05/23/2021    No Known Allergies  Past Surgical History:  Procedure Laterality Date   CATARACT EXTRACTION W/PHACO Right 11/10/2021   Procedure: CATARACT EXTRACTION PHACO AND INTRAOCULAR LENS PLACEMENT (IOC) RIGHT DIABETIC  22.44 02:07.2;  Surgeon: Mittie Gaskin, MD;  Location: [facility];  Service: Ophthalmology;  Laterality: Right;   CATARACT EXTRACTION W/PHACO Left 12/01/2021   Procedure: CATARACT EXTRACTION PHACO AND INTRAOCULAR LENS PLACEMENT (IOC) LEFT DIABETIC 3.10 00:41.8;  Surgeon: Mittie Gaskin, MD;  Location: [facility];  Service: Ophthalmology;  Laterality: Left;   FOOT SURGERY     FRACTURE SURGERY  ?   Foot fracture, no surgery   TONSILLECTOMY      Social History   Tobacco Use   Smoking status: Never    Passive exposure: Never   Smokeless tobacco: Never  Vaping Use   Vaping status: Never Used  Substance Use Topics   Alcohol use: Never   Drug use: Never     Medication list has been reviewed and updated.  Current Meds  Medication Sig   Accu-Chek Softclix Lancets lancets Use twice a day   amLODipine  (NORVASC ) 5 MG tablet TAKE 1 TABLET (5 MG TOTAL) BY MOUTH DAILY.   aspirin  81 MG EC tablet Take 1 tablet (81 mg total) by mouth daily. Swallow whole.   atorvastatin  (LIPITOR ) 80 MG tablet TAKE 1 TABLET BY MOUTH EVERY DAY IN THE EVENING   Cholecalciferol (VITAMIN D -3 PO) Take by mouth daily.   cyanocobalamin  1000 MCG tablet Take 1 tablet (1,000 mcg total) by mouth daily.   glucose blood (ACCU-CHEK GUIDE TEST) test strip 1 each by Other route 2 (two) times daily.   metFORMIN  (GLUCOPHAGE ) 500 MG tablet TAKE 1 TABLET BY MOUTH TWICE A DAY WITH FOOD (Patient taking differently: Take 500 mg by mouth daily with breakfast.)        06/17/2024   10:58 AM 06/17/2024   10:49 AM 02/16/2024   11:05 AM 06/16/2023    9:35 AM  GAD 7 : Generalized Anxiety Score  Nervous, Anxious, on Edge 0 0 0 0  Control/stop worrying 0 0 0 0  Worry too much - different things 0 0 0 0  Trouble relaxing 0 0 0 0  Restless 0 0 0 0  Easily annoyed or irritable 0 0 0 0  Afraid - awful might happen 0 0 0 0  Total GAD 7 Score 0 0 0 0  Anxiety Difficulty Not difficult at all Not difficult at all Not difficult at all Not difficult at all       06/17/2024   10:58 AM 06/17/2024   10:49 AM 02/16/2024   11:04 AM  Depression screen PHQ 2/9  Decreased Interest 0 0 0  Down, Depressed, Hopeless 0 0  0  PHQ - 2 Score 0 0 0  Altered sleeping 0 0 0  Tired, decreased energy 0 0 0  Change in appetite 0 0 0  Feeling bad or failure about yourself  0 0 0  Trouble concentrating 0 0 0  Moving slowly or fidgety/restless 0 0 0  Suicidal thoughts 0 0 0  PHQ-9 Score 0 0 0  Difficult doing work/chores Not difficult at all Not difficult at all Not difficult at all    BP Readings from Last 3 Encounters:  06/17/24 102/60  02/16/24 112/68  10/18/23 118/62    Physical Exam Vitals and nursing note reviewed.  Constitutional:      General: She is not in acute distress.    Appearance: She is well-developed.  HENT:     Head: Normocephalic and atraumatic.     Right Ear: Tympanic membrane and ear canal normal.     Left Ear: Tympanic membrane and ear canal normal.     Nose:     Right Sinus: No maxillary sinus tenderness.     Left Sinus: No maxillary sinus tenderness.  Eyes:     General: No scleral icterus.       Right eye: No discharge.        Left eye: No discharge.     Conjunctiva/sclera: Conjunctivae normal.  Neck:     Thyroid: No thyromegaly.     Vascular: No carotid bruit.  Cardiovascular:     Rate and Rhythm: Normal rate and regular rhythm.     Pulses: Normal pulses.     Heart sounds: Normal heart sounds.  Pulmonary:     Effort: Pulmonary effort is  normal. No respiratory distress.     Breath sounds: No wheezing.  Abdominal:     General: Bowel sounds are normal.     Palpations: Abdomen is soft.     Tenderness: There is no abdominal tenderness.  Musculoskeletal:     Cervical back: Normal range of motion. No erythema.     Right lower leg: No edema.     Left lower leg: No edema.  Lymphadenopathy:     Cervical: No cervical adenopathy.  Skin:    General: Skin is warm and dry.     Findings: No rash.  Neurological:     Mental Status: She is alert and oriented to person, place, and time.     Cranial Nerves: No cranial nerve deficit.     Sensory: No sensory deficit.     Deep Tendon Reflexes: Reflexes are normal and symmetric.     Comments: Very slight dysarthria noted  Psychiatric:        Attention and Perception: Attention normal.        Mood and Affect: Mood normal.        Behavior: Behavior normal.     Wt Readings from Last 3 Encounters:  06/17/24 140 lb (63.5 kg)  02/16/24 144 lb 4 oz (65.4 kg)  10/18/23 143 lb (64.9 kg)    BP 102/60   Pulse 100   Ht 5' 8 (1.727 m)   Wt 140 lb (63.5 kg)   SpO2 99%   BMI 21.29 kg/m   Assessment and Plan:  Problem List Items Addressed This Visit       Unprioritized   Essential hypertension - Primary (Chronic)   Blood pressure is well controlled.  Current medications amlodipine . Will continue same regimen along with efforts to limit dietary sodium.       Relevant Orders  CBC with Differential/Platelet   Comprehensive metabolic panel with GFR   Type II diabetes mellitus with complication (HCC) (Chronic)   Blood sugars have been stable.  No recent hypoglycemic events requiring assistance. Currently medications are MTF.  Home glucoses 90-110. Lab Results  Component Value Date   HGBA1C 6.1 (A) 02/16/2024   Last visit no changes were made. She continues on MTF once per day.       Relevant Orders   Hemoglobin A1c   Microalbumin / creatinine urine ratio   TSH    Hyperlipidemia associated with type 2 diabetes mellitus (HCC) (Chronic)   LDL is  Lab Results  Component Value Date   LDLCALC 35 06/16/2023   Current regimen is atorvastatin .  No medication side effects noted. Goal LDL is <55.       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 Recommend regular exercise such as walking. 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 Continue ASA and statin.      Stenosis of right internal carotid artery   Recommend repeat Carotid US  Continue statin therapy      Relevant Orders   US  Carotid Bilateral   Other Visit Diagnoses       Colon cancer screening       she declines any form of CRC screening     Encounter for screening mammogram for breast cancer       She declines mammogram and breast exam counselled on self exam and follow up for any concerns     Long term current use of oral hypoglycemic drug           Return in about 4 months (around 10/18/2024) for DM.    Leita HILARIO Adie, MD [facility] [place]

## 2024-06-17 NOTE — Patient Instructions (Signed)
Schedule your diabetic eye exam.  

## 2024-06-17 NOTE — Assessment & Plan Note (Signed)
 Recommend repeat Carotid US  Continue statin therapy

## 2024-06-17 NOTE — Assessment & Plan Note (Signed)
 Essentially resolved. Recommend regular exercise such as walking. Continue ASA and statin; BP control

## 2024-06-17 NOTE — Assessment & Plan Note (Signed)
 LDL is  Lab Results  Component Value Date   LDLCALC 35 06/16/2023   Current regimen is atorvastatin .  No medication side effects noted. Goal LDL is <55.

## 2024-06-17 NOTE — Assessment & Plan Note (Signed)
 Blood sugars have been stable.  No recent hypoglycemic events requiring assistance. Currently medications are MTF.  Home glucoses 90-110. Lab Results  Component Value Date   HGBA1C 6.1 (A) 02/16/2024   Last visit no changes were made. She continues on MTF once per day.

## 2024-06-17 NOTE — Assessment & Plan Note (Signed)
 Blood pressure is well controlled.  Current medications amlodipine. Will continue same regimen along with efforts to limit dietary sodium.

## 2024-06-17 NOTE — Assessment & Plan Note (Signed)
 Minimal dysarthria stable. Continue ASA and statin.

## 2024-06-18 ENCOUNTER — Ambulatory Visit: Payer: Self-pay | Admitting: Internal Medicine

## 2024-06-18 LAB — LIPID PANEL
Chol/HDL Ratio: 2.3 ratio (ref 0.0–4.4)
Cholesterol, Total: 93 mg/dL — ABNORMAL LOW (ref 100–199)
HDL: 41 mg/dL (ref >39)
LDL Chol Calc (NIH): 36 mg/dL (ref 0–99)
Triglycerides: 76 mg/dL (ref 0–149)
VLDL Cholesterol Cal: 16 mg/dL (ref 5–40)

## 2024-06-18 LAB — CBC WITH DIFFERENTIAL/PLATELET
Basophils Absolute: 0 x10E3/uL (ref 0.0–0.2)
Basos: 1 %
EOS (ABSOLUTE): 0.3 x10E3/uL (ref 0.0–0.4)
Eos: 3 %
Hematocrit: 38.5 % (ref 34.0–46.6)
Hemoglobin: 12.7 g/dL (ref 11.1–15.9)
Immature Grans (Abs): 0 x10E3/uL (ref 0.0–0.1)
Immature Granulocytes: 0 %
Lymphocytes Absolute: 2.1 x10E3/uL (ref 0.7–3.1)
Lymphs: 25 %
MCH: 28.8 pg (ref 26.6–33.0)
MCHC: 33 g/dL (ref 31.5–35.7)
MCV: 87 fL (ref 79–97)
Monocytes Absolute: 0.6 x10E3/uL (ref 0.1–0.9)
Monocytes: 7 %
Neutrophils Absolute: 5.3 x10E3/uL (ref 1.4–7.0)
Neutrophils: 64 %
Platelets: 336 x10E3/uL (ref 150–450)
RBC: 4.41 x10E6/uL (ref 3.77–5.28)
RDW: 12.7 % (ref 11.7–15.4)
WBC: 8.3 x10E3/uL (ref 3.4–10.8)

## 2024-06-18 LAB — COMPREHENSIVE METABOLIC PANEL WITH GFR
ALT: 19 IU/L (ref 0–32)
AST: 22 IU/L (ref 0–40)
Albumin: 4.4 g/dL (ref 3.8–4.8)
Alkaline Phosphatase: 77 IU/L (ref 44–121)
BUN/Creatinine Ratio: 21 (ref 12–28)
BUN: 20 mg/dL (ref 8–27)
Bilirubin Total: 0.4 mg/dL (ref 0.0–1.2)
CO2: 22 mmol/L (ref 20–29)
Calcium: 10 mg/dL (ref 8.7–10.3)
Chloride: 104 mmol/L (ref 96–106)
Creatinine, Ser: 0.97 mg/dL (ref 0.57–1.00)
Globulin, Total: 2.4 g/dL (ref 1.5–4.5)
Glucose: 106 mg/dL — ABNORMAL HIGH (ref 70–99)
Potassium: 4.8 mmol/L (ref 3.5–5.2)
Sodium: 144 mmol/L (ref 134–144)
Total Protein: 6.8 g/dL (ref 6.0–8.5)
eGFR: 62 mL/min/1.73 (ref >59)

## 2024-06-18 LAB — MICROALBUMIN / CREATININE URINE RATIO
Creatinine, Urine: 338.7 mg/dL
Microalb/Creat Ratio: 62 mg/g{creat} — ABNORMAL HIGH (ref 0–29)
Microalbumin, Urine: 211.5 ug/mL

## 2024-06-18 LAB — TSH: TSH: 3.19 u[IU]/mL (ref 0.450–4.500)

## 2024-06-18 LAB — HEMOGLOBIN A1C
Est. average glucose Bld gHb Est-mCnc: 131 mg/dL
Hgb A1c MFr Bld: 6.2 % — ABNORMAL HIGH (ref 4.8–5.6)

## 2024-07-01 ENCOUNTER — Ambulatory Visit
Admission: RE | Admit: 2024-07-01 | Discharge: 2024-07-01 | Disposition: A | Discharge status: Home / Self Care | Source: Ambulatory Visit | Attending: Internal Medicine | Admitting: Internal Medicine

## 2024-07-01 DIAGNOSIS — I6521 Occlusion and stenosis of right carotid artery: Secondary | ICD-10-CM | POA: Diagnosis present

## 2024-07-06 ENCOUNTER — Other Ambulatory Visit: Payer: Self-pay | Admitting: Internal Medicine

## 2024-07-06 DIAGNOSIS — E118 Type 2 diabetes mellitus with unspecified complications: Secondary | ICD-10-CM

## 2024-07-08 NOTE — Telephone Encounter (Signed)
 Requested medication (s) are due for refill today - yes  Requested medication (s) are on the active medication list -yes  Future visit scheduled -yes  Last refill: 03/03/24 #200 3RF  Notes to clinic:   Pharmacy comment: Alternative Requested:INSURANCE WILL ONLY PAY FOR 100 STRIPS EVERY 3 MONTHS...PA REQUIRED FOR 2 TIMES DAILY.    Requested Prescriptions  Pending Prescriptions Disp Refills   ACCU-CHEK GUIDE TEST test strip [Pharmacy Med Name: ACCU-CHEK GUIDE TEST STRIP] 200 strip 3    Sig: USE TWICE A DAY     There is no refill protocol information for this order       Requested Prescriptions  Pending Prescriptions Disp Refills   ACCU-CHEK GUIDE TEST test strip [Pharmacy Med Name: ACCU-CHEK GUIDE TEST STRIP] 200 strip 3    Sig: USE TWICE A DAY     There is no refill protocol information for this order

## 2024-07-19 LAB — HM DIABETES EYE EXAM

## 2024-07-20 ENCOUNTER — Other Ambulatory Visit: Payer: Self-pay | Admitting: Internal Medicine

## 2024-07-20 DIAGNOSIS — E118 Type 2 diabetes mellitus with unspecified complications: Secondary | ICD-10-CM

## 2024-07-23 NOTE — Telephone Encounter (Signed)
 Requested Prescriptions  Pending Prescriptions Disp Refills   metFORMIN  (GLUCOPHAGE ) 500 MG tablet [Pharmacy Med Name: METFORMIN  HCL 500 MG TABLET] 180 tablet 0    Sig: TAKE 1 TABLET BY MOUTH TWICE A DAY WITH FOOD     Endocrinology:  Diabetes - Biguanides Failed - 07/23/2024  2:24 PM      Failed - B12 Level in normal range and within 720 days    Vitamin B-12  Date Value Ref Range Status  05/23/2021 97 (L) 180 - 914 pg/mL Final    Comment:    (NOTE) This assay is not validated for testing neonatal or myeloproliferative syndrome specimens for Vitamin B12 levels. Performed at [facility], [address] [place], [place] [address]          Passed - Cr in normal range and within 360 days    Creatinine, Ser  Date Value Ref Range Status  06/17/2024 0.97 0.57 - 1.00 mg/dL Final         Passed - HBA1C is between 0 and 7.9 and within 180 days    Hgb A1c MFr Bld  Date Value Ref Range Status  06/17/2024 6.2 (H) 4.8 - 5.6 % Final    Comment:             Prediabetes: 5.7 - 6.4          Diabetes: >6.4          Glycemic control for adults with diabetes: <7.0          Passed - eGFR in normal range and within 360 days    GFR, Estimated  Date Value Ref Range Status  05/31/2021 >60 >60 mL/min Final    Comment:    (NOTE) Calculated using the CKD-EPI Creatinine Equation (2021)    eGFR  Date Value Ref Range Status  06/17/2024 62 >59 mL/min/1.73 Final         Passed - Valid encounter within last 6 months    Recent Outpatient Visits           1 month ago Essential hypertension   [place] [facility] at [facility] Leita DEL, MD   5 months ago Type II diabetes mellitus with complication [facility])   [place] [facility] at [facility], Leita DEL, MD              Passed - CBC within normal limits and completed in the last 12 months    WBC  Date Value Ref Range Status  06/17/2024 8.3 3.4 - 10.8 x10E3/uL  Final  05/31/2021 6.9 4.0 - 10.5 K/uL Final   RBC  Date Value Ref Range Status  06/17/2024 4.41 3.77 - 5.28 x10E6/uL Final  05/31/2021 4.68 3.87 - 5.11 MIL/uL Final   Hemoglobin  Date Value Ref Range Status  06/17/2024 12.7 11.1 - 15.9 g/dL Final   Hematocrit  Date Value Ref Range Status  06/17/2024 38.5 34.0 - 46.6 % Final   MCHC  Date Value Ref Range Status  06/17/2024 33.0 31.5 - 35.7 g/dL Final  93/72/7977 66.6 30.0 - 36.0 g/dL Final   [facility]  Date Value Ref Range Status  06/17/2024 28.8 26.6 - 33.0 pg Final  05/31/2021 28.6 26.0 - 34.0 pg Final   MCV  Date Value Ref Range Status  06/17/2024 87 79 - 97 fL Final   No results found for: PLTCOUNTKUC, LABPLAT, POCPLA RDW  Date Value Ref Range Status  06/17/2024 12.7  11.7 - 15.4 % Final

## 2024-07-24 ENCOUNTER — Encounter: Payer: Self-pay | Admitting: Internal Medicine

## 2024-08-04 ENCOUNTER — Other Ambulatory Visit: Payer: Self-pay | Admitting: Internal Medicine

## 2024-08-04 DIAGNOSIS — I10 Essential (primary) hypertension: Secondary | ICD-10-CM

## 2024-08-06 NOTE — Telephone Encounter (Signed)
 OV 06/17/24- advised continue Requested Prescriptions  Pending Prescriptions Disp Refills   amLODipine  (NORVASC ) 5 MG tablet [Pharmacy Med Name: AMLODIPINE  BESYLATE 5 MG TAB] 90 tablet 0    Sig: TAKE 1 TABLET (5 MG TOTAL) BY MOUTH DAILY.     Cardiovascular: Calcium  Channel Blockers 2 Passed - 08/06/2024 12:31 PM      Passed - Last BP in normal range    BP Readings from Last 1 Encounters:  06/17/24 102/60         Passed - Last Heart Rate in normal range    Pulse Readings from Last 1 Encounters:  06/17/24 100         Passed - Valid encounter within last 6 months    Recent Outpatient Visits           1 month ago Essential hypertension   [place] [facility] at [facility], Leita DEL, MD   5 months ago Type II diabetes mellitus with complication [facility])   [place] [facility] at [facility], Leita DEL, MD

## 2024-08-09 ENCOUNTER — Telehealth: Payer: Self-pay | Admitting: Internal Medicine

## 2024-08-09 NOTE — Telephone Encounter (Signed)
 Copied from CRM [id number]. Topic: Medicare AWV >> Aug 09, 2024 11:13 AM Nathanel DEL wrote: Reason for CRM: LVM 08/08/24 to change awv appt on 09/05/24 from office visit to phone visit due to [facility] working remote.   Nathanel Paschal; Care Guide Ambulatory Clinical Support [place] [facility] Direct Dial: [phone]

## 2024-08-30 ENCOUNTER — Other Ambulatory Visit: Payer: Self-pay | Admitting: Internal Medicine

## 2024-08-30 NOTE — Telephone Encounter (Signed)
 Requested Prescriptions  Pending Prescriptions Disp Refills   atorvastatin  (LIPITOR ) 80 MG tablet [Pharmacy Med Name: ATORVASTATIN  80 MG TABLET] 90 tablet 0    Sig: TAKE 1 TABLET BY MOUTH EVERY DAY IN THE EVENING     Cardiovascular:  Antilipid - Statins Failed - 08/30/2024  3:53 PM      Failed - Lipid Panel in normal range within the last 12 months    Cholesterol, Total  Date Value Ref Range Status  06/17/2024 93 (L) 100 - 199 mg/dL Final   LDL Chol Calc (NIH)  Date Value Ref Range Status  06/17/2024 36 0 - 99 mg/dL Final   LDL Direct  Date Value Ref Range Status  10/10/2022 37 0 - 99 mg/dL Final   HDL  Date Value Ref Range Status  06/17/2024 41 >39 mg/dL Final   Triglycerides  Date Value Ref Range Status  06/17/2024 76 0 - 149 mg/dL Final         Passed - Patient is not pregnant      Passed - Valid encounter within last 12 months    Recent Outpatient Visits           2 months ago Essential hypertension   [place] [facility] at [facility], Leita DEL, MD   6 months ago Type II diabetes mellitus with complication [facility])   [place] [facility] at [facility], Leita DEL, MD

## 2024-09-05 ENCOUNTER — Ambulatory Visit (INDEPENDENT_AMBULATORY_CARE_PROVIDER_SITE_OTHER)

## 2024-09-05 ENCOUNTER — Ambulatory Visit: Admitting: Emergency Medicine

## 2024-09-05 VITALS — Ht 68.0 in | Wt 144.0 lb

## 2024-09-05 DIAGNOSIS — Z0000 Encounter for general adult medical examination without abnormal findings: Secondary | ICD-10-CM | POA: Diagnosis not present

## 2024-09-05 DIAGNOSIS — Z23 Encounter for immunization: Secondary | ICD-10-CM | POA: Diagnosis not present

## 2024-09-05 NOTE — Progress Notes (Signed)
 Patient is in office today for a nurse visit for Immunization. Patient Injection was given in the  Left deltoid. Patient tolerated injection well.  Added to [facility].  *Routed to Rolan Hoyle, since Dr Justus is out of the office.  Laura [name]  Primary [facility] [place] CMA [facility] [address]  [place] [address] Office [phone]  Fax: [phone]

## 2024-09-05 NOTE — Patient Instructions (Addendum)
 Laura [name],  Thank you for taking the time for your Medicare Wellness Visit. I appreciate your continued commitment to your health goals. Please review the care plan we discussed, and feel free to reach out if I can assist you further.  Medicare recommends these wellness visits once per year to help you and your care team stay ahead of potential health issues. These visits are designed to focus on prevention, allowing your provider to concentrate on managing your acute and chronic conditions during your regular appointments.  Please note that Annual Wellness Visits do not include a physical exam. Some assessments may be limited, especially if the visit was conducted virtually. If needed, we may recommend a separate in-person follow-up with your provider.  Ongoing Care Seeing your primary care provider every 3 to 6 months helps us  monitor your health and provide consistent, personalized care.   Referrals If a referral was made during today's visit and you haven't received any updates within two weeks, please contact the referred provider directly to check on the status.  Recommended Screenings: Get the flu and covid vaccines at your convenience.    Health Maintenance  Topic Date Due   Colon Cancer Screening  Never done   Flu Shot  07/05/2024   COVID-19 Vaccine (6 - 2024-25 season) 08/05/2024   Hemoglobin A1C  12/18/2024   Yearly kidney function blood test for diabetes  06/17/2025   Yearly kidney health urinalysis for diabetes  06/17/2025   Complete foot exam   06/17/2025   Eye exam for diabetics  07/19/2025   Medicare Annual Wellness Visit  09/05/2025   DEXA scan (bone density measurement)  09/02/2026   DTaP/Tdap/Td vaccine (2 - Td or Tdap) 02/01/2033   Pneumococcal Vaccine for age over 30  Completed   Hepatitis C Screening  Completed   Zoster (Shingles) Vaccine  Completed   HPV Vaccine  Aged Out   Meningitis B Vaccine  Aged Out   Breast Cancer Screening  Discontinued        09/05/2024    1:22 PM  Advanced Directives  Does Patient Have a Medical Advance Directive? No  Would patient like information on creating a medical advance directive? No - Patient declined   Advance Care Planning is important because it: Ensures you receive medical care that aligns with your values, goals, and preferences. Provides guidance to your family and loved ones, reducing the emotional burden of decision-making during critical moments.  Vision: Annual vision screenings are recommended for early detection of glaucoma, cataracts, and diabetic retinopathy. These exams can also reveal signs of chronic conditions such as diabetes and high blood pressure.  Dental: Annual dental screenings help detect early signs of oral cancer, gum disease, and other conditions linked to overall health, including heart disease and diabetes.  Please see the attached documents for additional preventive care recommendations.    Fall Prevention in the Home, Adult Falls can cause injuries and affect people of all ages. There are many simple things that you can do to make your home safe and to help prevent falls. If you need it, ask for help making these changes. What actions can I take to prevent falls? General information Use good lighting in all rooms. Make sure to: Replace any light bulbs that burn out. Turn on lights if it is dark and use night-lights. Keep items that you use often in easy-to-reach places. Lower the shelves around your home if needed. Move furniture so that there are clear paths around it. Do not  keep throw rugs or other things on the floor that can make you trip. If any of your floors are uneven, fix them. Add color or contrast paint or tape to clearly mark and help you see: Grab bars or handrails. First and last steps of staircases. Where the edge of each step is. If you use a ladder or stepladder: Make sure that it is fully opened. Do not climb a closed ladder. Make sure the  sides of the ladder are locked in place. Have someone hold the ladder while you use it. Know where your pets are as you move through your home. What can I do in the bathroom?     Keep the floor dry. Clean up any water that is on the floor right away. Remove soap buildup in the bathtub or shower. Buildup makes bathtubs and showers slippery. Use non-skid mats or decals on the floor of the bathtub or shower. Attach bath mats securely with double-sided, non-slip rug tape. If you need to sit down while you are in the shower, use a non-slip stool. Install grab bars by the toilet and in the bathtub and shower. Do not use towel bars as grab bars. What can I do in the bedroom? Make sure that you have a light by your bed that is easy to reach. Do not use any sheets or blankets on your bed that hang to the floor. Have a firm bench or chair with side arms that you can use for support when you get dressed. What can I do in the kitchen? Clean up any spills right away. If you need to reach something above you, use a sturdy step stool that has a grab bar. Keep electrical cables out of the way. Do not use floor polish or wax that makes floors slippery. What can I do with my stairs? Do not leave anything on the stairs. Make sure that you have a light switch at the top and the bottom of the stairs. Have them installed if you do not have them. Make sure that there are handrails on both sides of the stairs. Fix handrails that are broken or loose. Make sure that handrails are as long as the staircases. Install non-slip stair treads on all stairs in your home if they do not have carpet. Avoid having throw rugs at the top or bottom of stairs, or secure the rugs with carpet tape to prevent them from moving. Choose a carpet design that does not hide the edge of steps on the stairs. Make sure that carpet is firmly attached to the stairs. Fix any carpet that is loose or worn. What can I do on the outside of my  home? Use bright outdoor lighting. Repair the edges of walkways and driveways and fix any cracks. Clear paths of anything that can make you trip, such as tools or rocks. Add color or contrast paint or tape to clearly mark and help you see high doorway thresholds. Trim any bushes or trees on the main path into your home. Check that handrails are securely fastened and in good repair. Both sides of all steps should have handrails. Install guardrails along the edges of any raised decks or porches. Have leaves, snow, and ice cleared regularly. Use sand, salt, or ice melt on walkways during winter months if you live where there is ice and snow. In the garage, clean up any spills right away, including grease or oil spills. What other actions can I take? Review your medicines with your  health care provider. Some medicines can make you confused or feel dizzy. This can increase your chance of falling. Wear closed-toe shoes that fit well and support your feet. Wear shoes that have rubber soles and low heels. Use a cane, walker, scooter, or crutches that help you move around if needed. Talk with your provider about other ways that you can decrease your risk of falls. This may include seeing a physical therapist to learn to do exercises to improve movement and strength. Where to find more information Centers for Disease Control and Prevention, STEADI: TonerPromos.no [employer] on Aging: BaseRingTones.pl National Institute on Aging: BaseRingTones.pl Contact a health care provider if: You are afraid of falling at home. You feel weak, drowsy, or dizzy at home. You fall at home. Get help right away if you: Lose consciousness or have trouble moving after a fall. Have a fall that causes a head injury. These symptoms may be an emergency. Get help right away. Call 911. Do not wait to see if the symptoms will go away. Do not drive yourself to the hospital. This information is not intended to replace advice given to you  by your health care provider. Make sure you discuss any questions you have with your health care provider. Document Revised: 07/25/2022 Document Reviewed: 07/25/2022 Elsevier Patient Education  2024 [employer].

## 2024-09-05 NOTE — Progress Notes (Signed)
 Subjective:   Laura [name] is a 73 y.o. who presents for a Medicare Wellness preventive visit.  As a reminder, Annual Wellness Visits don't include a physical exam, and some assessments may be limited, especially if this visit is performed virtually. We may recommend an in-person follow-up visit with your provider if needed.  Visit Complete: Virtual I connected with  Apolinar Cords on 09/05/24 by a audio enabled telemedicine application and verified that I am speaking with the correct person using two identifiers.  Patient Location: Home  Provider Location: Home Office  I discussed the limitations of evaluation and management by telemedicine. The patient expressed understanding and agreed to proceed.  Vital Signs: Because this visit was a virtual/telehealth visit, some criteria may be missing or patient reported. Any vitals not documented were not able to be obtained and vitals that have been documented are patient reported.  VideoDeclined- This patient declined Librarian, academic. Therefore the visit was completed with audio only.  Persons Participating in Visit: Patient.  AWV Questionnaire: Yes: Patient Medicare AWV questionnaire was completed by the patient on 09/04/24; I have confirmed that all information answered by patient is correct and no changes since this date.  Cardiac Risk Factors include: advanced age (>71men, >37 women);dyslipidemia;hypertension;diabetes mellitus     Objective:    Today's Vitals   09/05/24 1314  Weight: 144 lb (65.3 kg)  Height: 5' 8 (1.727 m)   Body mass index is 21.9 kg/m.     09/05/2024    1:22 PM 08/29/2023    1:48 PM 12/01/2021    8:02 AM 11/10/2021    6:40 AM 08/19/2021    3:16 PM 08/11/2021    3:34 PM 07/06/2021    1:15 PM  Advanced Directives  Does Patient Have a Medical Advance Directive? No No No No Yes Yes No  Type of Financial trader   Copy of  Healthcare Power of Attorney in Chart?     No - copy requested No - copy requested   Would patient like information on creating a medical advance directive? No - Patient declined No - Patient declined No - Patient declined No - Patient declined       Current Medications (verified) Outpatient Encounter Medications as of 09/05/2024  Medication Sig   Accu-Chek Softclix Lancets lancets Use twice a day   amLODipine  (NORVASC ) 5 MG tablet TAKE 1 TABLET (5 MG TOTAL) BY MOUTH DAILY.   aspirin  81 MG EC tablet Take 1 tablet (81 mg total) by mouth daily. Swallow whole.   atorvastatin  (LIPITOR ) 80 MG tablet TAKE 1 TABLET BY MOUTH EVERY DAY IN THE EVENING   Cholecalciferol (VITAMIN D -3 PO) Take by mouth daily.   cyanocobalamin  1000 MCG tablet Take 1 tablet (1,000 mcg total) by mouth daily.   glucose blood (ACCU-CHEK GUIDE TEST) test strip 1 each by Other route daily. Use to test blood sugar once daily while fasting   metFORMIN  (GLUCOPHAGE ) 500 MG tablet TAKE 1 TABLET BY MOUTH TWICE A DAY WITH FOOD (Patient taking differently: Take 500 mg by mouth 2 (two) times daily with a meal. Taking 500mg  once a day)   No facility-administered encounter medications on file as of 09/05/2024.    Allergies (verified) Patient has no known allergies.   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   Stroke (HCC) 05/23/2021   Past Surgical History:  Procedure Laterality Date   CATARACT EXTRACTION W/PHACO Right 11/10/2021   Procedure: CATARACT EXTRACTION PHACO AND INTRAOCULAR LENS PLACEMENT (IOC) RIGHT DIABETIC  22.44 02:07.2;  Surgeon: Mittie Gaskin, MD;  Location: [facility];  Service:  Ophthalmology;  Laterality: Right;   CATARACT EXTRACTION W/PHACO Left 12/01/2021   Procedure: CATARACT EXTRACTION PHACO AND INTRAOCULAR LENS PLACEMENT (IOC) LEFT DIABETIC 3.10 00:41.8;  Surgeon: Mittie Gaskin, MD;  Location: [facility];  Service: Ophthalmology;  Laterality: Left;   FOOT SURGERY     FRACTURE SURGERY  ?   Foot fracture, no surgery   TONSILLECTOMY     Family History  Problem Relation Age of Onset   Hypertension Mother    Diabetes Mother    Arthritis Mother    Heart disease Mother    Heart failure Father    Stroke Father    COPD Father    Hypertension Sister    Arthritis Sister    Heart disease Maternal Grandfather    Heart disease Maternal Grandmother    Stroke Paternal Grandfather    Stroke Paternal Grandmother    Arthritis Sister    Hypertension Sister    Cancer Maternal Aunt    Diabetes Maternal Aunt    Cancer Paternal Aunt    Hypertension Sister    Varicose Veins Sister    Arthritis Sister    Social History   Socioeconomic History   Marital status: Single    Spouse name: Not on file   Number of children: 0   Years of education: Not on file   Highest education level: Bachelor's degree (e.g., BA, AB, BS)  Occupational History   Occupation: Retired  Tobacco Use   Smoking status: Never    Passive exposure: Never   Smokeless tobacco: Never  Vaping Use   Vaping status: Never Used  Substance and Sexual Activity   Alcohol use: Never   Drug use: Never   Sexual activity: Not Currently    Birth control/protection: None  Other Topics Concern   Not on file  Social History Narrative   Not on file   Social Drivers of Health   Financial Resource Strain: Low Risk  (09/05/2024)   Overall Financial Resource Strain (CARDIA)    Difficulty of Paying Living Expenses: Not hard at all  Food Insecurity: No Food Insecurity (09/05/2024)   Hunger Vital Sign    Worried About Running Out of Food in the Last Year: Never true    Ran Out of Food in the  Last Year: Never true  Transportation Needs: No Transportation Needs (09/05/2024)   PRAPARE - Administrator, Civil Service (Medical): No    Lack of Transportation (Non-Medical): No  Physical Activity: Insufficiently Active (09/05/2024)   Exercise Vital Sign    Days of Exercise per Week: 3 days    Minutes of Exercise per Session: 30 min  Stress: No Stress Concern Present (09/05/2024)   [employer] of Occupational Health - Occupational Stress Questionnaire    Feeling of Stress: Not at all  Social Connections: Socially Isolated (09/05/2024)   Social Connection and Isolation Panel    Frequency of Communication with Friends and Family: Twice a week    Frequency of Social Gatherings with Friends and Family: Twice a week  Attends Religious Services: Never    Active Member of Clubs or Organizations: No    Attends Banker Meetings: Never    Marital Status: Never married    Tobacco Counseling Counseling given: Not Answered    Clinical Intake:  Pre-visit preparation completed: Yes  Pain : No/denies pain     BMI - recorded: 21.9 Nutritional Status: BMI of 19-24  Normal Nutritional Risks: None Diabetes: Yes CBG done?: No Did pt. bring in CBG monitor from home?: No  Lab Results  Component Value Date   HGBA1C 6.2 (H) 06/17/2024   HGBA1C 6.1 (A) 02/16/2024   HGBA1C 6.1 (A) 10/18/2023     How often do you need to have someone help you when you read instructions, pamphlets, or other written materials from your doctor or pharmacy?: 1 - Never  Interpreter Needed?: No  Information entered by :: Vina Ned, CMA   Activities of Daily Living     09/05/2024    1:16 PM 09/04/2024    7:37 PM  In your present state of health, do you have any difficulty performing the following activities:  Hearing? 1 0  Comment wears hearing aids   Vision? 0 0  Difficulty concentrating or making decisions? 0 0  Walking or climbing stairs? 0 0  Dressing or bathing? 0  0  Doing errands, shopping? 1 0  Comment doesn't drive, family takes to appointments   Preparing Food and eating ? N N  Using the Toilet? N N  In the past six months, have you accidently leaked urine? N N  Do you have problems with loss of bowel control? N N  Managing your Medications? N N  Managing your Finances? N N  Housekeeping or managing your Housekeeping? N N    Patient Care Team: Justus Leita DEL, MD as PCP - General (Internal Medicine) Mittie Gaskin, MD as Referring Physician (Ophthalmology) Donzella Moats, AUD (Audiology)  I have updated your Care Teams any recent Medical Services you may have received from other providers in the past year.     Assessment:   This is a routine wellness examination for Dejuana.  Hearing/Vision screen Hearing Screening - Comments:: Wears hearing aids Vision Screening - Comments:: Gets DM eye exams, Dr. Mittie Jacobs [place]   Goals Addressed               This Visit's Progress     DIET - INCREASE WATER INTAKE (pt-stated)         Depression Screen     09/05/2024    1:20 PM 06/17/2024   10:58 AM 06/17/2024   10:49 AM 02/16/2024   11:04 AM 08/29/2023    1:46 PM 06/16/2023    9:35 AM 10/10/2022    2:41 PM  PHQ 2/9 Scores  PHQ - 2 Score 0 0 0 0 0 0 0  PHQ- 9 Score 0 0 0 0 0 0 0    Fall Risk     09/05/2024    1:23 PM 09/04/2024    7:37 PM 06/17/2024   10:58 AM 06/17/2024   10:49 AM 02/16/2024   11:04 AM  Fall Risk   Falls in the past year? 0 0 0 0 0  Number falls in past yr: 0  0 0 0  Injury with Fall? 0  0 0 0  Risk for fall due to : No Fall Risks  No Fall Risks No Fall Risks No Fall Risks  Follow up Falls evaluation completed  Falls evaluation completed  Falls evaluation completed Falls evaluation completed    MEDICARE RISK AT HOME:  Medicare Risk at Home Any stairs in or around the home?: No If so, are there any without handrails?: No Home free of loose throw rugs in walkways, pet beds, electrical cords, etc?:  Yes Adequate lighting in your home to reduce risk of falls?: Yes Life alert?: No Use of a cane, walker or w/c?: No Grab bars in the bathroom?: Yes Shower chair or bench in shower?: Yes Elevated toilet seat or a handicapped toilet?: No  TIMED UP AND GO:  Was the test performed?  No  Cognitive Function: 6CIT completed        09/05/2024    1:24 PM 08/29/2023    1:51 PM 08/19/2022    2:25 PM  6CIT Screen  What Year? 0 points 0 points 0 points  What month? 0 points 0 points 0 points  What time? 0 points 0 points 0 points  Count back from 20 0 points 0 points 0 points  Months in reverse 0 points 0 points 0 points  Repeat phrase 2 points 0 points 2 points  Total Score 2 points 0 points 2 points    Immunizations Immunization History  Administered Date(s) Administered   Fluad Quad(high Dose 65+) 08/19/2022   INFLUENZA, HIGH DOSE SEASONAL PF 08/17/2023   Influenza-Unspecified 10/09/2021   PFIZER Comirnaty(Gray Top)Covid-19 Tri-Sucrose Vaccine 09/23/2021, 10/14/2021, 12/18/2021   PNEUMOCOCCAL CONJUGATE-20 07/26/2021   Pfizer(Comirnaty)Fall Seasonal Vaccine 12 years and older 08/10/2023   Respiratory Syncytial Virus Vaccine,Recomb Aduvanted(Arexvy) 09/22/2022   Tdap 02/01/2023   Unspecified SARS-COV-2 Vaccination 09/10/2022   Zoster Recombinant(Shingrix) 09/29/2022, 01/12/2023    Screening Tests Health Maintenance  Topic Date Due   Colonoscopy  Never done   Influenza Vaccine  07/05/2024   COVID-19 Vaccine (6 - 2024-25 season) 08/05/2024   HEMOGLOBIN A1C  12/18/2024   Diabetic kidney evaluation - eGFR measurement  06/17/2025   Diabetic kidney evaluation - Urine ACR  06/17/2025   FOOT EXAM  06/17/2025   OPHTHALMOLOGY EXAM  07/19/2025   Medicare Annual Wellness (AWV)  09/05/2025   DEXA SCAN  09/02/2026   DTaP/Tdap/Td (2 - Td or Tdap) 02/01/2033   Pneumococcal Vaccine: 50+ Years  Completed   Hepatitis C Screening  Completed   Zoster Vaccines- Shingrix  Completed   HPV  VACCINES  Aged Out   Meningococcal B Vaccine  Aged Out   Mammogram  Discontinued    Health Maintenance Items Addressed: Vaccines Due: flu and Covid, See Nurse Notes at the end of this note  Additional Screening:  Vision Screening: Recommended annual ophthalmology exams for early detection of glaucoma and other disorders of the eye. Is the patient up to date with their annual eye exam?  Yes  Who is the provider or what is the name of the office in which the patient attends annual eye exams? Dr. Mittie @ [facility] [place]  Dental Screening: Recommended annual dental exams for proper oral hygiene  Community Resource Referral / Chronic Care Management: CRR required this visit?  No   CCM required this visit?  No   Plan:    I have personally reviewed and noted the following in the patient's chart:   Medical and social history Use of alcohol, tobacco or illicit drugs  Current medications and supplements including opioid prescriptions. Patient is not currently taking opioid prescriptions. Functional ability and status Nutritional status Physical activity Advanced directives List of other physicians Hospitalizations, surgeries, and ER visits in previous  12 months Vitals Screenings to include cognitive, depression, and falls Referrals and appointments  In addition, I have reviewed and discussed with patient certain preventive protocols, quality metrics, and best practice recommendations. A written personalized care plan for preventive services as well as general preventive health recommendations were provided to patient.   Vina Ned, CMA   09/05/2024   After Visit Summary: (MyChart) Due to this being a telephonic visit, the after visit summary with patients personalized plan was offered to patient via MyChart   Notes:  FBS this morning per patient was 94 6 CIT Score - 2 Plans to get Covid and flu vaccines Declined MMG and colonoscopy

## 2024-10-21 ENCOUNTER — Encounter: Payer: Self-pay | Admitting: Internal Medicine

## 2024-10-21 ENCOUNTER — Ambulatory Visit (INDEPENDENT_AMBULATORY_CARE_PROVIDER_SITE_OTHER): Admitting: Internal Medicine

## 2024-10-21 VITALS — BP 120/72 | HR 85 | Ht 68.0 in | Wt 143.0 lb

## 2024-10-21 DIAGNOSIS — I10 Essential (primary) hypertension: Secondary | ICD-10-CM | POA: Diagnosis not present

## 2024-10-21 DIAGNOSIS — Z7984 Long term (current) use of oral hypoglycemic drugs: Secondary | ICD-10-CM

## 2024-10-21 DIAGNOSIS — E118 Type 2 diabetes mellitus with unspecified complications: Secondary | ICD-10-CM | POA: Diagnosis not present

## 2024-10-21 DIAGNOSIS — Z1211 Encounter for screening for malignant neoplasm of colon: Secondary | ICD-10-CM

## 2024-10-21 LAB — POCT GLYCOSYLATED HEMOGLOBIN (HGB A1C): Hemoglobin A1C: 6.1 % — AB (ref 4.0–5.6)

## 2024-10-21 MED ORDER — METFORMIN HCL 500 MG PO TABS: 500.0000 mg | ORAL_TABLET | Freq: Every day | ORAL | 1 refills | Status: AC

## 2024-10-21 NOTE — Assessment & Plan Note (Signed)
 Well controlled blood pressure today. Current regimen is amlodipine . No medication side effects noted.

## 2024-10-21 NOTE — Assessment & Plan Note (Signed)
 Currently medications are MTF.  No hypoglycemic episodes noted. Home blood sugars in the 110 range. Last visit medical regimen changes were none. Lab Results  Component Value Date   HGBA1C 6.2 (H) 06/17/2024  A1C today =

## 2024-10-21 NOTE — Progress Notes (Signed)
 Date:  10/21/2024   Name:  Laura [name]   DOB:  01-Aug-1951   MRN:  [id number]   Chief Complaint: Diabetes  Hypertension This is a chronic problem. The problem is controlled. Pertinent negatives include no chest pain, headaches, palpitations or shortness of breath. Past treatments include calcium  channel blockers. The current treatment provides significant improvement. Hypertensive end-organ damage includes CVA. There is no history of kidney disease or CAD/MI.  Diabetes She presents for her follow-up diabetic visit. She has type 2 diabetes mellitus. Her disease course has been stable. Pertinent negatives for hypoglycemia include no dizziness or headaches. Pertinent negatives for diabetes include no chest pain, no fatigue and no weakness. Diabetic complications include a CVA. Current diabetic treatment includes oral agent (monotherapy) (metformin ).    Review of Systems  Constitutional:  Negative for fatigue and unexpected weight change.  HENT:  Negative for trouble swallowing.   Eyes:  Negative for visual disturbance.  Respiratory:  Negative for cough, chest tightness, shortness of breath and wheezing.   Cardiovascular:  Negative for chest pain, palpitations and leg swelling.  Gastrointestinal:  Negative for abdominal pain, constipation and diarrhea.  Musculoskeletal:  Negative for arthralgias and myalgias.  Neurological:  Negative for dizziness, weakness, light-headedness and headaches.     Lab Results  Component Value Date   NA 144 06/17/2024   K 4.8 06/17/2024   CO2 22 06/17/2024   GLUCOSE 106 (H) 06/17/2024   BUN 20 06/17/2024   CREATININE 0.97 06/17/2024   CALCIUM  10.0 06/17/2024   EGFR 62 06/17/2024   GFRNONAA >60 05/31/2021   Lab Results  Component Value Date   CHOL 93 (L) 06/17/2024   HDL 41 06/17/2024   LDLCALC 36 06/17/2024   LDLDIRECT 37 10/10/2022   TRIG 76 06/17/2024   CHOLHDL 2.3 06/17/2024   Lab Results  Component Value Date   TSH 3.190 06/17/2024    Lab Results  Component Value Date   HGBA1C 6.1 (A) 10/21/2024   Lab Results  Component Value Date   WBC 8.3 06/17/2024   HGB 12.7 06/17/2024   HCT 38.5 06/17/2024   MCV 87 06/17/2024   PLT 336 06/17/2024   Lab Results  Component Value Date   ALT 19 06/17/2024   AST 22 06/17/2024   ALKPHOS 77 06/17/2024   BILITOT 0.4 06/17/2024   Lab Results  Component Value Date   VD25OH 114.0 (H) 02/04/2022     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) 06/02/2021   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 (CVA) 06/02/2021   Vitamin D  deficiency 06/02/2021   Type II diabetes mellitus with complication (HCC)    Vitamin B12 deficiency    Essential hypertension 05/23/2021    No Known Allergies  Past Surgical History:  Procedure Laterality Date   CATARACT EXTRACTION W/PHACO Right 11/10/2021   Procedure: CATARACT EXTRACTION PHACO AND INTRAOCULAR LENS PLACEMENT (IOC) RIGHT DIABETIC  22.44 02:07.2;  Surgeon: Mittie Gaskin, MD;  Location: [facility];  Service: Ophthalmology;  Laterality: Right;   CATARACT EXTRACTION W/PHACO Left 12/01/2021   Procedure: CATARACT EXTRACTION PHACO AND INTRAOCULAR LENS PLACEMENT (IOC) LEFT DIABETIC 3.10 00:41.8;  Surgeon: Mittie Gaskin, MD;  Location: [facility];  Service: Ophthalmology;  Laterality: Left;   FOOT SURGERY     FRACTURE SURGERY  ?  Foot fracture, no surgery   TONSILLECTOMY      Social History   Tobacco Use   Smoking status: Never    Passive exposure: Never   Smokeless tobacco: Never  Vaping Use   Vaping status: Never Used  Substance Use Topics   Alcohol use: Never   Drug use: Never     Medication list has been reviewed and updated.  Current Meds  Medication Sig   Accu-Chek Softclix  Lancets lancets Use twice a day   amLODipine  (NORVASC ) 5 MG tablet TAKE 1 TABLET (5 MG TOTAL) BY MOUTH DAILY.   aspirin  81 MG EC tablet Take 1 tablet (81 mg total) by mouth daily. Swallow whole.   atorvastatin  (LIPITOR ) 80 MG tablet TAKE 1 TABLET BY MOUTH EVERY DAY IN THE EVENING   Cholecalciferol (VITAMIN D -3 PO) Take by mouth daily.   cyanocobalamin  1000 MCG tablet Take 1 tablet (1,000 mcg total) by mouth daily.   glucose blood (ACCU-CHEK GUIDE TEST) test strip 1 each by Other route daily. Use to test blood sugar once daily while fasting   [DISCONTINUED] metFORMIN  (GLUCOPHAGE ) 500 MG tablet TAKE 1 TABLET BY MOUTH TWICE A DAY WITH FOOD (Patient taking differently: Take 500 mg by mouth 2 (two) times daily with a meal. Taking 500mg  once a day)       10/21/2024   11:24 AM 06/17/2024   10:58 AM 06/17/2024   10:49 AM 02/16/2024   11:05 AM  GAD 7 : Generalized Anxiety Score  Nervous, Anxious, on Edge 0 0 0 0  Control/stop worrying 0 0 0 0  Worry too much - different things 0 0 0 0  Trouble relaxing 0 0 0 0  Restless 0 0 0 0  Easily annoyed or irritable 0 0 0 0  Afraid - awful might happen 0 0 0 0  Total GAD 7 Score 0 0 0 0  Anxiety Difficulty Not difficult at all Not difficult at all Not difficult at all Not difficult at all       10/21/2024   11:24 AM 09/05/2024    1:20 PM 06/17/2024   10:58 AM  Depression screen PHQ 2/9  Decreased Interest 0 0 0  Down, Depressed, Hopeless 0 0 0  PHQ - 2 Score 0 0 0  Altered sleeping  0 0  Tired, decreased energy  0 0  Change in appetite  0 0  Feeling bad or failure about yourself   0 0  Trouble concentrating  0 0  Moving slowly or fidgety/restless  0 0  Suicidal thoughts  0 0  PHQ-9 Score  0  0   Difficult doing work/chores  Not difficult at all Not difficult at all     Data saved with a previous flowsheet row definition    BP Readings from Last 3 Encounters:  10/21/24 120/72  06/17/24 102/60  02/16/24 112/68    Physical Exam Vitals  and nursing note reviewed.  Constitutional:      General: She is not in acute distress.    Appearance: She is well-developed.  HENT:     Head: Normocephalic and atraumatic.  Cardiovascular:     Rate and Rhythm: Normal rate and regular rhythm.  Pulmonary:     Effort: Pulmonary effort is normal. No respiratory distress.     Breath sounds: No wheezing or rhonchi.  Musculoskeletal:     Cervical back: Normal range of motion.     Right lower leg: No edema.     Left lower  leg: No edema.  Lymphadenopathy:     Cervical: No cervical adenopathy.  Skin:    General: Skin is warm and dry.     Findings: No rash.  Neurological:     Mental Status: She is alert and oriented to person, place, and time.  Psychiatric:        Mood and Affect: Mood normal.        Behavior: Behavior normal.     Wt Readings from Last 3 Encounters:  10/21/24 143 lb (64.9 kg)  09/05/24 144 lb (65.3 kg)  06/17/24 140 lb (63.5 kg)    BP 120/72   Pulse 85   Ht 5' 8 (1.727 m)   Wt 143 lb (64.9 kg)   SpO2 100%   BMI 21.74 kg/m   Assessment and Plan:  Problem List Items Addressed This Visit       Unprioritized   Essential hypertension - Primary (Chronic)   Well controlled blood pressure today. Current regimen is amlodipine . No medication side effects noted.        Type II diabetes mellitus with complication (HCC) (Chronic)   Currently medications are MTF.  No hypoglycemic episodes noted. Home blood sugars in the 110 range. Last visit medical regimen changes were none. Lab Results  Component Value Date   HGBA1C 6.2 (H) 06/17/2024  A1C today =        Relevant Medications   metFORMIN  (GLUCOPHAGE ) 500 MG tablet   Other Relevant Orders   POCT glycosylated hemoglobin (Hb A1C) (Completed)   Other Visit Diagnoses       Long term current use of oral hypoglycemic drug         Colon cancer screening       she declines any form of screening at this time.       Return in about 4 months (around  02/18/2025) for DM, HTN  Dr Lemon.    Leita HILARIO Adie, MD [facility] [place]

## 2024-11-02 ENCOUNTER — Other Ambulatory Visit: Payer: Self-pay | Admitting: Internal Medicine

## 2024-11-02 DIAGNOSIS — I10 Essential (primary) hypertension: Secondary | ICD-10-CM

## 2024-11-06 NOTE — Telephone Encounter (Signed)
 Requested by interface surescripts. Future visit 02/20/24. Requested Prescriptions  Pending Prescriptions Disp Refills   amLODipine  (NORVASC ) 5 MG tablet [Pharmacy Med Name: AMLODIPINE  BESYLATE 5 MG TAB] 90 tablet 0    Sig: TAKE 1 TABLET (5 MG TOTAL) BY MOUTH DAILY.     Cardiovascular: Calcium  Channel Blockers 2 Passed - 11/06/2024 11:41 AM      Passed - Last BP in normal range    BP Readings from Last 1 Encounters:  10/21/24 120/72         Passed - Last Heart Rate in normal range    Pulse Readings from Last 1 Encounters:  10/21/24 85         Passed - Valid encounter within last 6 months    Recent Outpatient Visits           2 weeks ago Essential hypertension   [place] [facility], Leita DEL, MD   4 months ago Essential hypertension   [place] [facility] at [facility], Leita DEL, MD   8 months ago Type II diabetes mellitus with complication [facility])   [place] [facility] at [facility], Leita DEL, MD

## 2024-11-30 ENCOUNTER — Other Ambulatory Visit: Payer: Self-pay | Admitting: Internal Medicine

## 2024-12-03 NOTE — Telephone Encounter (Signed)
 Requested Prescriptions  Pending Prescriptions Disp Refills   atorvastatin  (LIPITOR ) 80 MG tablet [Pharmacy Med Name: ATORVASTATIN  80 MG TABLET] 90 tablet 0    Sig: TAKE 1 TABLET BY MOUTH EVERY DAY IN THE EVENING     Cardiovascular:  Antilipid - Statins Failed - 12/03/2024  7:44 AM      Failed - Lipid Panel in normal range within the last 12 months    Cholesterol, Total  Date Value Ref Range Status  06/17/2024 93 (L) 100 - 199 mg/dL Final   LDL Chol Calc (NIH)  Date Value Ref Range Status  06/17/2024 36 0 - 99 mg/dL Final   LDL Direct  Date Value Ref Range Status  10/10/2022 37 0 - 99 mg/dL Final   HDL  Date Value Ref Range Status  06/17/2024 41 >39 mg/dL Final   Triglycerides  Date Value Ref Range Status  06/17/2024 76 0 - 149 mg/dL Final         Passed - Patient is not pregnant      Passed - Valid encounter within last 12 months    Recent Outpatient Visits           1 month ago Essential hypertension   [place] [facility] at [facility], Leita DEL, MD   5 months ago Essential hypertension   [place] [facility] at [facility], Leita DEL, MD   9 months ago Type II diabetes mellitus with complication [facility])   [place] [facility] at [facility], Leita DEL, MD

## 2025-02-19 ENCOUNTER — Ambulatory Visit: Admitting: Student

## 2025-09-17 ENCOUNTER — Ambulatory Visit

## 2025-09-18 ENCOUNTER — Ambulatory Visit
